# Patient Record
Sex: Male | Born: 1935 | Race: White | Hispanic: No | Marital: Married | Smoking: Former smoker
Health system: Southern US, Community
[De-identification: ages and names within clinical notes are randomized; demographics above are authoritative.]

## PROBLEM LIST (undated history)

## (undated) DIAGNOSIS — I4891 Unspecified atrial fibrillation: Secondary | ICD-10-CM

## (undated) DIAGNOSIS — I10 Essential (primary) hypertension: Secondary | ICD-10-CM

## (undated) DIAGNOSIS — C679 Malignant neoplasm of bladder, unspecified: Secondary | ICD-10-CM

## (undated) DIAGNOSIS — I639 Cerebral infarction, unspecified: Secondary | ICD-10-CM

## (undated) HISTORY — PX: PACEMAKER INSERTION: SHX728

## (undated) HISTORY — DX: Cerebral infarction, unspecified: I63.9

---

## 1982-04-16 DIAGNOSIS — C679 Malignant neoplasm of bladder, unspecified: Secondary | ICD-10-CM

## 1982-04-16 HISTORY — DX: Malignant neoplasm of bladder, unspecified: C67.9

## 2008-11-18 ENCOUNTER — Ambulatory Visit (HOSPITAL_COMMUNITY): Admission: RE | Admit: 2008-11-18 | Discharge: 2008-11-18 | Payer: Self-pay | Admitting: Neurological Surgery

## 2008-12-13 ENCOUNTER — Inpatient Hospital Stay (HOSPITAL_COMMUNITY): Admission: RE | Admit: 2008-12-13 | Discharge: 2008-12-14 | Payer: Self-pay | Admitting: Neurological Surgery

## 2009-10-12 ENCOUNTER — Emergency Department (HOSPITAL_BASED_OUTPATIENT_CLINIC_OR_DEPARTMENT_OTHER): Admission: EM | Admit: 2009-10-12 | Discharge: 2009-10-12 | Payer: Self-pay | Admitting: Emergency Medicine

## 2010-02-13 ENCOUNTER — Ambulatory Visit (HOSPITAL_COMMUNITY): Admission: RE | Admit: 2010-02-13 | Discharge: 2010-02-13 | Payer: Self-pay | Admitting: Neurological Surgery

## 2010-02-23 ENCOUNTER — Inpatient Hospital Stay (HOSPITAL_COMMUNITY): Admission: RE | Admit: 2010-02-23 | Discharge: 2010-02-27 | Payer: Self-pay | Admitting: Neurological Surgery

## 2010-06-27 LAB — BASIC METABOLIC PANEL
CO2: 25 mEq/L (ref 19–32)
Calcium: 8.3 mg/dL — ABNORMAL LOW (ref 8.4–10.5)
Calcium: 8.4 mg/dL (ref 8.4–10.5)
Calcium: 9.6 mg/dL (ref 8.4–10.5)
GFR calc Af Amer: 53 mL/min — ABNORMAL LOW (ref >60)
GFR calc Af Amer: >60 mL/min (ref >60)
GFR calc Af Amer: >60 mL/min (ref >60)
GFR calc non Af Amer: 44 mL/min — ABNORMAL LOW (ref >60)
GFR calc non Af Amer: 56 mL/min — ABNORMAL LOW (ref >60)
Glucose, Bld: 135 mg/dL — ABNORMAL HIGH (ref 70–99)
Sodium: 135 mEq/L (ref 135–145)
Sodium: 139 mEq/L (ref 135–145)
Sodium: 140 mEq/L (ref 135–145)

## 2010-06-27 LAB — SURGICAL PCR SCREEN
MRSA, PCR: NEGATIVE
Staphylococcus aureus: NEGATIVE

## 2010-06-27 LAB — CBC
Hemoglobin: 11.9 g/dL — ABNORMAL LOW (ref 13.0–17.0)
Hemoglobin: 14.9 g/dL (ref 13.0–17.0)
MCHC: 34.2 g/dL (ref 30.0–36.0)
Platelets: 182 10*3/uL (ref 150–400)
RBC: 3.87 MIL/uL — ABNORMAL LOW (ref 4.22–5.81)
RBC: 4.77 MIL/uL (ref 4.22–5.81)

## 2010-06-27 LAB — TYPE AND SCREEN: Antibody Screen: NEGATIVE

## 2010-06-28 LAB — POCT I-STAT, CHEM 8
BUN: 17 mg/dL (ref 6–23)
HCT: 46 % (ref 39.0–52.0)
Hemoglobin: 15.6 g/dL (ref 13.0–17.0)
Sodium: 140 mEq/L (ref 135–145)
TCO2: 27 mmol/L (ref 0–100)

## 2010-06-28 LAB — GLUCOSE, CAPILLARY
Glucose-Capillary: 120 mg/dL — ABNORMAL HIGH (ref 70–99)
Glucose-Capillary: 140 mg/dL — ABNORMAL HIGH (ref 70–99)

## 2010-07-22 LAB — GLUCOSE, CAPILLARY
Glucose-Capillary: 116 mg/dL — ABNORMAL HIGH (ref 70–99)
Glucose-Capillary: 90 mg/dL (ref 70–99)
Glucose-Capillary: 99 mg/dL (ref 70–99)

## 2010-07-22 LAB — CBC
HCT: 42.8 % (ref 39.0–52.0)
Platelets: 150 10*3/uL (ref 150–400)
WBC: 7.7 10*3/uL (ref 4.0–10.5)

## 2010-07-22 LAB — BASIC METABOLIC PANEL
BUN: 21 mg/dL (ref 6–23)
GFR calc non Af Amer: 58 mL/min — ABNORMAL LOW (ref >60)
Potassium: 4.4 mEq/L (ref 3.5–5.1)

## 2010-08-29 NOTE — Op Note (Signed)
NAMEFERGUSON, GERTNER               ACCOUNT NO.:  [id number]   MEDICAL RECORD NO.:  [id number]          PATIENT TYPE:  INP   LOCATION:  3533                         FACILITY:  [facility]   PHYSICIAN:  Stefani Dama, M.D.  DATE OF BIRTH:  June 15, 1930   DATE OF PROCEDURE:  12/13/2008  DATE OF DISCHARGE:                               OPERATIVE REPORT   PREOPERATIVE DIAGNOSIS:  Lumbar spondylosis, herniated nucleus pulposus  L1-L2 left with left lumbar radiculopathy.   POSTOPERATIVE DIAGNOSIS:  Lumbar spondylosis, herniated nucleus pulposus  L1-L2 left with left lumbar radiculopathy.   OPERATION:  Microdiskectomy L1-L2 left with operating microscope and  microdissection technique.   SURGEON:  Stefani Dama, MD   FIRST ASSISTANT:  Danae Orleans. Venetia Maxon, MD   ANESTHESIA:  General endotracheal.   INDICATIONS:  Mr. Xxavier Hays is a 77 year old individual who has had  significant problems with back and left lower extremity pain.  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  This was noted  on myelography.  After careful consideration of his options, I discussed  surgical decompression with microdiskectomy and this is now being  performed.   PROCEDURE:  The patient was brought to the operating room supine on the  stretcher.  After smooth induction of general endotracheal anesthesia,  he was turned prone and the back was prepped with alcohol and DuraPrep  and draped in a sterile fashion.  Care was taken to protect and pad all  the bony prominences.  Midline incision was then created and carried  down to the lumbodorsal fascia which was opened on the left side of  midline with a localizing radiograph identifying first the placement of  a needle at L1-L2.  Then, dissection demonstrated the interlaminar space  of what was believed to be L1-L2.  A second localizing radiograph  confirmed this.  A self-retaining McCullough retractor was used and a  laminotomy was then created  removing the inferior margin lamina of L1  out to the medial wall of the facet and a partial facetectomy was also  completed.  Once the L ligament was identified then this was taken up  with a 2 and 3 mm Kerrison punch.  Dura was noted to be dorsally and  medially and underneath this was noted to be a substantial mass with a  significant fibrosis over it.  By carefully dissecting the dura away,  the mass could be isolated and then when it was entered it was found to  be of large constellation of disk fragments which were removed in a  piecemeal fashion.  Carefully and arduously then the common dural tube  was decompressed from the left lateral border.  The disk space was then  further explored and the small osteophyte tool was then used to clear  further disk material more cephalad over the region of the disk space  and more medially.  The takeoff of the L1 nerve root superiorly was  decompressed in this fashion, removing disk fragment in addition to the  redundant and thickened ligament.  A portion of the superior articular  process for L2 was removed to allow good egress for the L1 nerve root  superiorly.  The inferior aspect of the L2 disk space was then explored  and the foramen for the L2 nerve root was decompressed.  Once this was  achieved, hemostasis in the soft tissues was achieved meticulously with  some Gelfoam pledgets which were irrigated away.  Once this was  ascertained to be as good as it could possibly be, the microscope was  removed.  The retractors were removed.  The lumbodorsal fascia was  closed  with #1 Vicryl in interrupted fashion, 2-0 Vicryl was used in the  subcutaneous tissues, and 3-0 Vicryl was used to close the subcuticular  skin.  The patient tolerated the procedure well.  Blood loss for this  procedure was less than 20 mL.      Stefani Dama, M.D.  Electronically Signed     HJE/MEDQ  D:  12/13/2008  T:  12/14/2008  Job:  [id number]

## 2011-09-18 DIAGNOSIS — H5340 Unspecified visual field defects: Secondary | ICD-10-CM | POA: Diagnosis not present

## 2011-09-18 DIAGNOSIS — H2510 Age-related nuclear cataract, unspecified eye: Secondary | ICD-10-CM | POA: Diagnosis not present

## 2011-09-18 DIAGNOSIS — H47329 Drusen of optic disc, unspecified eye: Secondary | ICD-10-CM | POA: Diagnosis not present

## 2012-09-29 DIAGNOSIS — H52209 Unspecified astigmatism, unspecified eye: Secondary | ICD-10-CM | POA: Diagnosis not present

## 2012-09-29 DIAGNOSIS — H2510 Age-related nuclear cataract, unspecified eye: Secondary | ICD-10-CM | POA: Diagnosis not present

## 2013-05-06 DIAGNOSIS — M25559 Pain in unspecified hip: Secondary | ICD-10-CM | POA: Diagnosis not present

## 2013-05-28 DIAGNOSIS — I10 Essential (primary) hypertension: Secondary | ICD-10-CM | POA: Diagnosis not present

## 2013-05-28 DIAGNOSIS — M48061 Spinal stenosis, lumbar region without neurogenic claudication: Secondary | ICD-10-CM | POA: Diagnosis not present

## 2013-05-28 DIAGNOSIS — Z6829 Body mass index (BMI) 29.0-29.9, adult: Secondary | ICD-10-CM | POA: Diagnosis not present

## 2013-06-01 DIAGNOSIS — I10 Essential (primary) hypertension: Secondary | ICD-10-CM | POA: Diagnosis not present

## 2013-06-04 DIAGNOSIS — M48061 Spinal stenosis, lumbar region without neurogenic claudication: Secondary | ICD-10-CM | POA: Diagnosis not present

## 2013-06-04 DIAGNOSIS — M47817 Spondylosis without myelopathy or radiculopathy, lumbosacral region: Secondary | ICD-10-CM | POA: Diagnosis not present

## 2013-06-04 DIAGNOSIS — M5126 Other intervertebral disc displacement, lumbar region: Secondary | ICD-10-CM | POA: Diagnosis not present

## 2013-06-09 DIAGNOSIS — E669 Obesity, unspecified: Secondary | ICD-10-CM | POA: Diagnosis not present

## 2013-06-09 DIAGNOSIS — M48061 Spinal stenosis, lumbar region without neurogenic claudication: Secondary | ICD-10-CM | POA: Diagnosis not present

## 2013-06-19 DIAGNOSIS — IMO0002 Reserved for concepts with insufficient information to code with codable children: Secondary | ICD-10-CM | POA: Diagnosis not present

## 2013-06-19 DIAGNOSIS — M47817 Spondylosis without myelopathy or radiculopathy, lumbosacral region: Secondary | ICD-10-CM | POA: Diagnosis not present

## 2013-08-17 DIAGNOSIS — R21 Rash and other nonspecific skin eruption: Secondary | ICD-10-CM | POA: Diagnosis not present

## 2013-10-27 DIAGNOSIS — R21 Rash and other nonspecific skin eruption: Secondary | ICD-10-CM | POA: Diagnosis not present

## 2014-01-27 ENCOUNTER — Other Ambulatory Visit (HOSPITAL_COMMUNITY): Payer: Self-pay | Admitting: Orthopedic Surgery

## 2014-01-27 DIAGNOSIS — M25562 Pain in left knee: Secondary | ICD-10-CM

## 2014-01-27 DIAGNOSIS — T84093A Other mechanical complication of internal left knee prosthesis, initial encounter: Secondary | ICD-10-CM | POA: Diagnosis not present

## 2014-01-27 DIAGNOSIS — Z96652 Presence of left artificial knee joint: Secondary | ICD-10-CM | POA: Diagnosis not present

## 2014-01-29 DIAGNOSIS — M47816 Spondylosis without myelopathy or radiculopathy, lumbar region: Secondary | ICD-10-CM | POA: Diagnosis not present

## 2014-01-29 DIAGNOSIS — M5417 Radiculopathy, lumbosacral region: Secondary | ICD-10-CM | POA: Diagnosis not present

## 2014-01-29 DIAGNOSIS — M5416 Radiculopathy, lumbar region: Secondary | ICD-10-CM | POA: Diagnosis not present

## 2014-01-29 DIAGNOSIS — Z981 Arthrodesis status: Secondary | ICD-10-CM | POA: Diagnosis not present

## 2014-01-29 DIAGNOSIS — M4727 Other spondylosis with radiculopathy, lumbosacral region: Secondary | ICD-10-CM | POA: Diagnosis not present

## 2014-02-10 ENCOUNTER — Encounter (HOSPITAL_COMMUNITY)
Admission: RE | Admit: 2014-02-10 | Discharge: 2014-02-10 | Disposition: A | Discharge status: Home / Self Care | Payer: Medicare Other | Source: Ambulatory Visit | Attending: Orthopedic Surgery | Admitting: Orthopedic Surgery

## 2014-02-10 DIAGNOSIS — M25562 Pain in left knee: Secondary | ICD-10-CM | POA: Insufficient documentation

## 2014-02-10 DIAGNOSIS — Z96659 Presence of unspecified artificial knee joint: Secondary | ICD-10-CM | POA: Diagnosis not present

## 2014-02-10 MED ORDER — TECHNETIUM TC 99M MEDRONATE IV KIT
26.8000 | PACK | Freq: Once | INTRAVENOUS | Status: AC | PRN
  Administered 2014-02-10: 26.8 via INTRAVENOUS

## 2014-09-03 DIAGNOSIS — Z961 Presence of intraocular lens: Secondary | ICD-10-CM | POA: Diagnosis not present

## 2014-09-03 DIAGNOSIS — H3531 Nonexudative age-related macular degeneration: Secondary | ICD-10-CM | POA: Diagnosis not present

## 2014-09-03 DIAGNOSIS — H43813 Vitreous degeneration, bilateral: Secondary | ICD-10-CM | POA: Diagnosis not present

## 2014-09-03 DIAGNOSIS — H3532 Exudative age-related macular degeneration: Secondary | ICD-10-CM | POA: Diagnosis not present

## 2014-09-30 DIAGNOSIS — H3532 Exudative age-related macular degeneration: Secondary | ICD-10-CM | POA: Diagnosis not present

## 2014-10-08 DIAGNOSIS — Z981 Arthrodesis status: Secondary | ICD-10-CM | POA: Diagnosis not present

## 2014-10-08 DIAGNOSIS — M5416 Radiculopathy, lumbar region: Secondary | ICD-10-CM | POA: Diagnosis not present

## 2014-10-08 DIAGNOSIS — M47816 Spondylosis without myelopathy or radiculopathy, lumbar region: Secondary | ICD-10-CM | POA: Diagnosis not present

## 2014-11-02 DIAGNOSIS — H3531 Nonexudative age-related macular degeneration: Secondary | ICD-10-CM | POA: Diagnosis not present

## 2014-11-02 DIAGNOSIS — H4011X4 Primary open-angle glaucoma, indeterminate stage: Secondary | ICD-10-CM | POA: Diagnosis not present

## 2014-11-02 DIAGNOSIS — Z7982 Long term (current) use of aspirin: Secondary | ICD-10-CM | POA: Diagnosis not present

## 2014-11-02 DIAGNOSIS — H47322 Drusen of optic disc, left eye: Secondary | ICD-10-CM | POA: Diagnosis not present

## 2014-11-02 DIAGNOSIS — H3532 Exudative age-related macular degeneration: Secondary | ICD-10-CM | POA: Diagnosis not present

## 2014-12-02 DIAGNOSIS — H43813 Vitreous degeneration, bilateral: Secondary | ICD-10-CM | POA: Diagnosis not present

## 2014-12-02 DIAGNOSIS — H3531 Nonexudative age-related macular degeneration: Secondary | ICD-10-CM | POA: Diagnosis not present

## 2014-12-02 DIAGNOSIS — H3532 Exudative age-related macular degeneration: Secondary | ICD-10-CM | POA: Diagnosis not present

## 2014-12-02 DIAGNOSIS — Z961 Presence of intraocular lens: Secondary | ICD-10-CM | POA: Diagnosis not present

## 2015-01-13 DIAGNOSIS — H43813 Vitreous degeneration, bilateral: Secondary | ICD-10-CM | POA: Diagnosis not present

## 2015-01-13 DIAGNOSIS — H3532 Exudative age-related macular degeneration: Secondary | ICD-10-CM | POA: Diagnosis not present

## 2015-01-13 DIAGNOSIS — H35363 Drusen (degenerative) of macula, bilateral: Secondary | ICD-10-CM | POA: Diagnosis not present

## 2015-01-13 DIAGNOSIS — H3531 Nonexudative age-related macular degeneration: Secondary | ICD-10-CM | POA: Diagnosis not present

## 2015-03-11 DIAGNOSIS — H401134 Primary open-angle glaucoma, bilateral, indeterminate stage: Secondary | ICD-10-CM | POA: Diagnosis not present

## 2015-03-11 DIAGNOSIS — Z79899 Other long term (current) drug therapy: Secondary | ICD-10-CM | POA: Diagnosis not present

## 2015-03-11 DIAGNOSIS — H353210 Exudative age-related macular degeneration, right eye, stage unspecified: Secondary | ICD-10-CM | POA: Diagnosis not present

## 2015-03-11 DIAGNOSIS — Z7982 Long term (current) use of aspirin: Secondary | ICD-10-CM | POA: Diagnosis not present

## 2015-04-15 DIAGNOSIS — I10 Essential (primary) hypertension: Secondary | ICD-10-CM | POA: Diagnosis not present

## 2015-04-15 DIAGNOSIS — Z87891 Personal history of nicotine dependence: Secondary | ICD-10-CM | POA: Diagnosis not present

## 2015-04-15 DIAGNOSIS — H353211 Exudative age-related macular degeneration, right eye, with active choroidal neovascularization: Secondary | ICD-10-CM | POA: Diagnosis not present

## 2015-04-15 DIAGNOSIS — H353122 Nonexudative age-related macular degeneration, left eye, intermediate dry stage: Secondary | ICD-10-CM | POA: Diagnosis not present

## 2015-04-15 DIAGNOSIS — Z7982 Long term (current) use of aspirin: Secondary | ICD-10-CM | POA: Diagnosis not present

## 2015-07-29 DIAGNOSIS — L309 Dermatitis, unspecified: Secondary | ICD-10-CM | POA: Diagnosis not present

## 2015-09-16 DIAGNOSIS — H353190 Nonexudative age-related macular degeneration, unspecified eye, stage unspecified: Secondary | ICD-10-CM | POA: Diagnosis not present

## 2015-09-16 DIAGNOSIS — H353122 Nonexudative age-related macular degeneration, left eye, intermediate dry stage: Secondary | ICD-10-CM | POA: Diagnosis not present

## 2015-09-16 DIAGNOSIS — H401134 Primary open-angle glaucoma, bilateral, indeterminate stage: Secondary | ICD-10-CM | POA: Diagnosis not present

## 2015-09-16 DIAGNOSIS — H353211 Exudative age-related macular degeneration, right eye, with active choroidal neovascularization: Secondary | ICD-10-CM | POA: Diagnosis not present

## 2015-10-27 DIAGNOSIS — I10 Essential (primary) hypertension: Secondary | ICD-10-CM | POA: Diagnosis not present

## 2015-10-27 DIAGNOSIS — Z6832 Body mass index (BMI) 32.0-32.9, adult: Secondary | ICD-10-CM | POA: Diagnosis not present

## 2015-10-27 DIAGNOSIS — M4806 Spinal stenosis, lumbar region: Secondary | ICD-10-CM | POA: Diagnosis not present

## 2015-10-28 DIAGNOSIS — H353122 Nonexudative age-related macular degeneration, left eye, intermediate dry stage: Secondary | ICD-10-CM | POA: Diagnosis not present

## 2015-10-28 DIAGNOSIS — H353211 Exudative age-related macular degeneration, right eye, with active choroidal neovascularization: Secondary | ICD-10-CM | POA: Diagnosis not present

## 2015-10-28 DIAGNOSIS — H401134 Primary open-angle glaucoma, bilateral, indeterminate stage: Secondary | ICD-10-CM | POA: Diagnosis not present

## 2015-10-31 DIAGNOSIS — I10 Essential (primary) hypertension: Secondary | ICD-10-CM | POA: Diagnosis not present

## 2015-11-02 DIAGNOSIS — M4806 Spinal stenosis, lumbar region: Secondary | ICD-10-CM | POA: Diagnosis not present

## 2015-11-16 DIAGNOSIS — M4806 Spinal stenosis, lumbar region: Secondary | ICD-10-CM | POA: Diagnosis not present

## 2015-11-25 DIAGNOSIS — Z981 Arthrodesis status: Secondary | ICD-10-CM | POA: Diagnosis not present

## 2015-11-25 DIAGNOSIS — M4806 Spinal stenosis, lumbar region: Secondary | ICD-10-CM | POA: Diagnosis not present

## 2015-11-25 DIAGNOSIS — M5136 Other intervertebral disc degeneration, lumbar region: Secondary | ICD-10-CM | POA: Diagnosis not present

## 2015-11-25 DIAGNOSIS — M5416 Radiculopathy, lumbar region: Secondary | ICD-10-CM | POA: Diagnosis not present

## 2015-11-25 DIAGNOSIS — M4726 Other spondylosis with radiculopathy, lumbar region: Secondary | ICD-10-CM | POA: Diagnosis not present

## 2015-12-02 DIAGNOSIS — H353211 Exudative age-related macular degeneration, right eye, with active choroidal neovascularization: Secondary | ICD-10-CM | POA: Diagnosis not present

## 2015-12-02 DIAGNOSIS — H353122 Nonexudative age-related macular degeneration, left eye, intermediate dry stage: Secondary | ICD-10-CM | POA: Diagnosis not present

## 2015-12-02 DIAGNOSIS — Z87891 Personal history of nicotine dependence: Secondary | ICD-10-CM | POA: Diagnosis not present

## 2016-10-29 DIAGNOSIS — M5416 Radiculopathy, lumbar region: Secondary | ICD-10-CM | POA: Diagnosis not present

## 2016-10-29 DIAGNOSIS — M5136 Other intervertebral disc degeneration, lumbar region: Secondary | ICD-10-CM | POA: Diagnosis not present

## 2016-10-29 DIAGNOSIS — Z981 Arthrodesis status: Secondary | ICD-10-CM | POA: Diagnosis not present

## 2016-10-29 DIAGNOSIS — M48061 Spinal stenosis, lumbar region without neurogenic claudication: Secondary | ICD-10-CM | POA: Diagnosis not present

## 2016-10-29 DIAGNOSIS — M4726 Other spondylosis with radiculopathy, lumbar region: Secondary | ICD-10-CM | POA: Diagnosis not present

## 2016-12-06 DIAGNOSIS — M4722 Other spondylosis with radiculopathy, cervical region: Secondary | ICD-10-CM | POA: Diagnosis not present

## 2017-02-18 DIAGNOSIS — M4727 Other spondylosis with radiculopathy, lumbosacral region: Secondary | ICD-10-CM | POA: Diagnosis not present

## 2017-02-18 DIAGNOSIS — S336XXA Sprain of sacroiliac joint, initial encounter: Secondary | ICD-10-CM | POA: Diagnosis not present

## 2017-02-18 DIAGNOSIS — M9904 Segmental and somatic dysfunction of sacral region: Secondary | ICD-10-CM | POA: Diagnosis not present

## 2017-02-18 DIAGNOSIS — M9903 Segmental and somatic dysfunction of lumbar region: Secondary | ICD-10-CM | POA: Diagnosis not present

## 2017-02-19 DIAGNOSIS — M4727 Other spondylosis with radiculopathy, lumbosacral region: Secondary | ICD-10-CM | POA: Diagnosis not present

## 2017-02-19 DIAGNOSIS — M9903 Segmental and somatic dysfunction of lumbar region: Secondary | ICD-10-CM | POA: Diagnosis not present

## 2017-02-19 DIAGNOSIS — M9904 Segmental and somatic dysfunction of sacral region: Secondary | ICD-10-CM | POA: Diagnosis not present

## 2017-02-19 DIAGNOSIS — S336XXA Sprain of sacroiliac joint, initial encounter: Secondary | ICD-10-CM | POA: Diagnosis not present

## 2017-02-20 DIAGNOSIS — M9904 Segmental and somatic dysfunction of sacral region: Secondary | ICD-10-CM | POA: Diagnosis not present

## 2017-02-20 DIAGNOSIS — M9903 Segmental and somatic dysfunction of lumbar region: Secondary | ICD-10-CM | POA: Diagnosis not present

## 2017-02-20 DIAGNOSIS — M4727 Other spondylosis with radiculopathy, lumbosacral region: Secondary | ICD-10-CM | POA: Diagnosis not present

## 2017-02-20 DIAGNOSIS — S336XXA Sprain of sacroiliac joint, initial encounter: Secondary | ICD-10-CM | POA: Diagnosis not present

## 2017-02-25 DIAGNOSIS — S336XXA Sprain of sacroiliac joint, initial encounter: Secondary | ICD-10-CM | POA: Diagnosis not present

## 2017-02-25 DIAGNOSIS — M4727 Other spondylosis with radiculopathy, lumbosacral region: Secondary | ICD-10-CM | POA: Diagnosis not present

## 2017-02-25 DIAGNOSIS — M9904 Segmental and somatic dysfunction of sacral region: Secondary | ICD-10-CM | POA: Diagnosis not present

## 2017-02-25 DIAGNOSIS — M9903 Segmental and somatic dysfunction of lumbar region: Secondary | ICD-10-CM | POA: Diagnosis not present

## 2017-02-26 DIAGNOSIS — M4727 Other spondylosis with radiculopathy, lumbosacral region: Secondary | ICD-10-CM | POA: Diagnosis not present

## 2017-02-26 DIAGNOSIS — M9904 Segmental and somatic dysfunction of sacral region: Secondary | ICD-10-CM | POA: Diagnosis not present

## 2017-02-26 DIAGNOSIS — M9903 Segmental and somatic dysfunction of lumbar region: Secondary | ICD-10-CM | POA: Diagnosis not present

## 2017-02-26 DIAGNOSIS — S336XXA Sprain of sacroiliac joint, initial encounter: Secondary | ICD-10-CM | POA: Diagnosis not present

## 2017-02-27 DIAGNOSIS — S336XXA Sprain of sacroiliac joint, initial encounter: Secondary | ICD-10-CM | POA: Diagnosis not present

## 2017-02-27 DIAGNOSIS — M4727 Other spondylosis with radiculopathy, lumbosacral region: Secondary | ICD-10-CM | POA: Diagnosis not present

## 2017-02-27 DIAGNOSIS — M9904 Segmental and somatic dysfunction of sacral region: Secondary | ICD-10-CM | POA: Diagnosis not present

## 2017-02-27 DIAGNOSIS — M9903 Segmental and somatic dysfunction of lumbar region: Secondary | ICD-10-CM | POA: Diagnosis not present

## 2017-02-28 DIAGNOSIS — M9903 Segmental and somatic dysfunction of lumbar region: Secondary | ICD-10-CM | POA: Diagnosis not present

## 2017-02-28 DIAGNOSIS — M9904 Segmental and somatic dysfunction of sacral region: Secondary | ICD-10-CM | POA: Diagnosis not present

## 2017-02-28 DIAGNOSIS — M4727 Other spondylosis with radiculopathy, lumbosacral region: Secondary | ICD-10-CM | POA: Diagnosis not present

## 2017-02-28 DIAGNOSIS — S336XXA Sprain of sacroiliac joint, initial encounter: Secondary | ICD-10-CM | POA: Diagnosis not present

## 2017-03-04 DIAGNOSIS — M9903 Segmental and somatic dysfunction of lumbar region: Secondary | ICD-10-CM | POA: Diagnosis not present

## 2017-03-04 DIAGNOSIS — M4727 Other spondylosis with radiculopathy, lumbosacral region: Secondary | ICD-10-CM | POA: Diagnosis not present

## 2017-03-04 DIAGNOSIS — S336XXA Sprain of sacroiliac joint, initial encounter: Secondary | ICD-10-CM | POA: Diagnosis not present

## 2017-03-04 DIAGNOSIS — M9904 Segmental and somatic dysfunction of sacral region: Secondary | ICD-10-CM | POA: Diagnosis not present

## 2017-03-05 DIAGNOSIS — M9904 Segmental and somatic dysfunction of sacral region: Secondary | ICD-10-CM | POA: Diagnosis not present

## 2017-03-05 DIAGNOSIS — M9903 Segmental and somatic dysfunction of lumbar region: Secondary | ICD-10-CM | POA: Diagnosis not present

## 2017-03-05 DIAGNOSIS — M4727 Other spondylosis with radiculopathy, lumbosacral region: Secondary | ICD-10-CM | POA: Diagnosis not present

## 2017-03-05 DIAGNOSIS — S336XXA Sprain of sacroiliac joint, initial encounter: Secondary | ICD-10-CM | POA: Diagnosis not present

## 2017-03-11 DIAGNOSIS — M4727 Other spondylosis with radiculopathy, lumbosacral region: Secondary | ICD-10-CM | POA: Diagnosis not present

## 2017-03-11 DIAGNOSIS — M9903 Segmental and somatic dysfunction of lumbar region: Secondary | ICD-10-CM | POA: Diagnosis not present

## 2017-03-11 DIAGNOSIS — M9904 Segmental and somatic dysfunction of sacral region: Secondary | ICD-10-CM | POA: Diagnosis not present

## 2017-03-11 DIAGNOSIS — S336XXA Sprain of sacroiliac joint, initial encounter: Secondary | ICD-10-CM | POA: Diagnosis not present

## 2017-03-12 DIAGNOSIS — S336XXA Sprain of sacroiliac joint, initial encounter: Secondary | ICD-10-CM | POA: Diagnosis not present

## 2017-03-12 DIAGNOSIS — M4727 Other spondylosis with radiculopathy, lumbosacral region: Secondary | ICD-10-CM | POA: Diagnosis not present

## 2017-03-12 DIAGNOSIS — M9903 Segmental and somatic dysfunction of lumbar region: Secondary | ICD-10-CM | POA: Diagnosis not present

## 2017-03-12 DIAGNOSIS — M9904 Segmental and somatic dysfunction of sacral region: Secondary | ICD-10-CM | POA: Diagnosis not present

## 2017-03-13 DIAGNOSIS — S336XXA Sprain of sacroiliac joint, initial encounter: Secondary | ICD-10-CM | POA: Diagnosis not present

## 2017-03-13 DIAGNOSIS — M9903 Segmental and somatic dysfunction of lumbar region: Secondary | ICD-10-CM | POA: Diagnosis not present

## 2017-03-13 DIAGNOSIS — M9904 Segmental and somatic dysfunction of sacral region: Secondary | ICD-10-CM | POA: Diagnosis not present

## 2017-03-13 DIAGNOSIS — M4727 Other spondylosis with radiculopathy, lumbosacral region: Secondary | ICD-10-CM | POA: Diagnosis not present

## 2017-03-18 DIAGNOSIS — M9903 Segmental and somatic dysfunction of lumbar region: Secondary | ICD-10-CM | POA: Diagnosis not present

## 2017-03-18 DIAGNOSIS — M9904 Segmental and somatic dysfunction of sacral region: Secondary | ICD-10-CM | POA: Diagnosis not present

## 2017-03-18 DIAGNOSIS — S336XXA Sprain of sacroiliac joint, initial encounter: Secondary | ICD-10-CM | POA: Diagnosis not present

## 2017-03-18 DIAGNOSIS — M4727 Other spondylosis with radiculopathy, lumbosacral region: Secondary | ICD-10-CM | POA: Diagnosis not present

## 2017-03-19 DIAGNOSIS — M9904 Segmental and somatic dysfunction of sacral region: Secondary | ICD-10-CM | POA: Diagnosis not present

## 2017-03-19 DIAGNOSIS — M9903 Segmental and somatic dysfunction of lumbar region: Secondary | ICD-10-CM | POA: Diagnosis not present

## 2017-03-19 DIAGNOSIS — S336XXA Sprain of sacroiliac joint, initial encounter: Secondary | ICD-10-CM | POA: Diagnosis not present

## 2017-03-19 DIAGNOSIS — M4727 Other spondylosis with radiculopathy, lumbosacral region: Secondary | ICD-10-CM | POA: Diagnosis not present

## 2017-03-20 DIAGNOSIS — S336XXA Sprain of sacroiliac joint, initial encounter: Secondary | ICD-10-CM | POA: Diagnosis not present

## 2017-03-20 DIAGNOSIS — M9904 Segmental and somatic dysfunction of sacral region: Secondary | ICD-10-CM | POA: Diagnosis not present

## 2017-03-20 DIAGNOSIS — M4727 Other spondylosis with radiculopathy, lumbosacral region: Secondary | ICD-10-CM | POA: Diagnosis not present

## 2017-03-20 DIAGNOSIS — M9903 Segmental and somatic dysfunction of lumbar region: Secondary | ICD-10-CM | POA: Diagnosis not present

## 2017-05-27 ENCOUNTER — Emergency Department (HOSPITAL_COMMUNITY): Payer: Medicare Other | Admitting: Anesthesiology

## 2017-05-27 ENCOUNTER — Encounter (HOSPITAL_COMMUNITY)
Admission: EM | Disposition: A | Discharge status: Rehabilitation Facility | Payer: Self-pay | Source: Home / Self Care | Attending: Neurology

## 2017-05-27 ENCOUNTER — Emergency Department (HOSPITAL_COMMUNITY): Payer: Medicare Other

## 2017-05-27 ENCOUNTER — Inpatient Hospital Stay (HOSPITAL_COMMUNITY)
Admission: EM | Admit: 2017-05-27 | Discharge: 2017-05-30 | DRG: 023 | Disposition: A | Discharge status: Rehabilitation Facility | Payer: Medicare Other | Attending: Neurology | Admitting: Neurology

## 2017-05-27 ENCOUNTER — Encounter (HOSPITAL_COMMUNITY): Payer: Self-pay

## 2017-05-27 ENCOUNTER — Other Ambulatory Visit: Payer: Self-pay

## 2017-05-27 DIAGNOSIS — I69354 Hemiplegia and hemiparesis following cerebral infarction affecting left non-dominant side: Secondary | ICD-10-CM | POA: Diagnosis not present

## 2017-05-27 DIAGNOSIS — I6789 Other cerebrovascular disease: Secondary | ICD-10-CM | POA: Diagnosis not present

## 2017-05-27 DIAGNOSIS — I69328 Other speech and language deficits following cerebral infarction: Secondary | ICD-10-CM | POA: Diagnosis not present

## 2017-05-27 DIAGNOSIS — I6340 Cerebral infarction due to embolism of unspecified cerebral artery: Secondary | ICD-10-CM | POA: Diagnosis not present

## 2017-05-27 DIAGNOSIS — J90 Pleural effusion, not elsewhere classified: Secondary | ICD-10-CM | POA: Diagnosis not present

## 2017-05-27 DIAGNOSIS — I69318 Other symptoms and signs involving cognitive functions following cerebral infarction: Secondary | ICD-10-CM | POA: Diagnosis not present

## 2017-05-27 DIAGNOSIS — L7632 Postprocedural hematoma of skin and subcutaneous tissue following other procedure: Secondary | ICD-10-CM | POA: Diagnosis not present

## 2017-05-27 DIAGNOSIS — I639 Cerebral infarction, unspecified: Secondary | ICD-10-CM | POA: Diagnosis not present

## 2017-05-27 DIAGNOSIS — Y92230 Patient room in hospital as the place of occurrence of the external cause: Secondary | ICD-10-CM | POA: Diagnosis not present

## 2017-05-27 DIAGNOSIS — I4891 Unspecified atrial fibrillation: Secondary | ICD-10-CM | POA: Diagnosis present

## 2017-05-27 DIAGNOSIS — I110 Hypertensive heart disease with heart failure: Secondary | ICD-10-CM | POA: Diagnosis not present

## 2017-05-27 DIAGNOSIS — R918 Other nonspecific abnormal finding of lung field: Secondary | ICD-10-CM | POA: Diagnosis not present

## 2017-05-27 DIAGNOSIS — Z79890 Hormone replacement therapy (postmenopausal): Secondary | ICD-10-CM | POA: Diagnosis not present

## 2017-05-27 DIAGNOSIS — K5900 Constipation, unspecified: Secondary | ICD-10-CM | POA: Diagnosis present

## 2017-05-27 DIAGNOSIS — R339 Retention of urine, unspecified: Secondary | ICD-10-CM | POA: Diagnosis not present

## 2017-05-27 DIAGNOSIS — I63511 Cerebral infarction due to unspecified occlusion or stenosis of right middle cerebral artery: Secondary | ICD-10-CM | POA: Diagnosis not present

## 2017-05-27 DIAGNOSIS — R29810 Facial weakness: Secondary | ICD-10-CM | POA: Diagnosis present

## 2017-05-27 DIAGNOSIS — R29818 Other symptoms and signs involving the nervous system: Secondary | ICD-10-CM | POA: Diagnosis not present

## 2017-05-27 DIAGNOSIS — I480 Paroxysmal atrial fibrillation: Secondary | ICD-10-CM | POA: Diagnosis not present

## 2017-05-27 DIAGNOSIS — I361 Nonrheumatic tricuspid (valve) insufficiency: Secondary | ICD-10-CM | POA: Diagnosis not present

## 2017-05-27 DIAGNOSIS — I5032 Chronic diastolic (congestive) heart failure: Secondary | ICD-10-CM | POA: Diagnosis present

## 2017-05-27 DIAGNOSIS — R29714 NIHSS score 14: Secondary | ICD-10-CM | POA: Diagnosis present

## 2017-05-27 DIAGNOSIS — I63411 Cerebral infarction due to embolism of right middle cerebral artery: Principal | ICD-10-CM | POA: Diagnosis present

## 2017-05-27 DIAGNOSIS — G8114 Spastic hemiplegia affecting left nondominant side: Secondary | ICD-10-CM | POA: Diagnosis not present

## 2017-05-27 DIAGNOSIS — Z79899 Other long term (current) drug therapy: Secondary | ICD-10-CM

## 2017-05-27 DIAGNOSIS — N318 Other neuromuscular dysfunction of bladder: Secondary | ICD-10-CM | POA: Diagnosis present

## 2017-05-27 DIAGNOSIS — I6601 Occlusion and stenosis of right middle cerebral artery: Secondary | ICD-10-CM | POA: Diagnosis present

## 2017-05-27 DIAGNOSIS — I10 Essential (primary) hypertension: Secondary | ICD-10-CM | POA: Diagnosis not present

## 2017-05-27 DIAGNOSIS — G8194 Hemiplegia, unspecified affecting left nondominant side: Secondary | ICD-10-CM | POA: Diagnosis not present

## 2017-05-27 DIAGNOSIS — M791 Myalgia, unspecified site: Secondary | ICD-10-CM | POA: Diagnosis present

## 2017-05-27 DIAGNOSIS — I63031 Cerebral infarction due to thrombosis of right carotid artery: Secondary | ICD-10-CM

## 2017-05-27 DIAGNOSIS — D62 Acute posthemorrhagic anemia: Secondary | ICD-10-CM | POA: Diagnosis not present

## 2017-05-27 DIAGNOSIS — R471 Dysarthria and anarthria: Secondary | ICD-10-CM | POA: Diagnosis present

## 2017-05-27 DIAGNOSIS — R414 Neurologic neglect syndrome: Secondary | ICD-10-CM | POA: Diagnosis not present

## 2017-05-27 DIAGNOSIS — N390 Urinary tract infection, site not specified: Secondary | ICD-10-CM | POA: Diagnosis not present

## 2017-05-27 DIAGNOSIS — Y838 Other surgical procedures as the cause of abnormal reaction of the patient, or of later complication, without mention of misadventure at the time of the procedure: Secondary | ICD-10-CM | POA: Diagnosis not present

## 2017-05-27 DIAGNOSIS — G936 Cerebral edema: Secondary | ICD-10-CM | POA: Diagnosis present

## 2017-05-27 DIAGNOSIS — R1310 Dysphagia, unspecified: Secondary | ICD-10-CM | POA: Diagnosis not present

## 2017-05-27 DIAGNOSIS — D320 Benign neoplasm of cerebral meninges: Secondary | ICD-10-CM | POA: Diagnosis present

## 2017-05-27 DIAGNOSIS — Z950 Presence of cardiac pacemaker: Secondary | ICD-10-CM

## 2017-05-27 DIAGNOSIS — E785 Hyperlipidemia, unspecified: Secondary | ICD-10-CM | POA: Diagnosis not present

## 2017-05-27 DIAGNOSIS — K5901 Slow transit constipation: Secondary | ICD-10-CM | POA: Diagnosis not present

## 2017-05-27 DIAGNOSIS — Z8679 Personal history of other diseases of the circulatory system: Secondary | ICD-10-CM | POA: Diagnosis not present

## 2017-05-27 DIAGNOSIS — I63432 Cerebral infarction due to embolism of left posterior cerebral artery: Secondary | ICD-10-CM | POA: Diagnosis not present

## 2017-05-27 DIAGNOSIS — I482 Chronic atrial fibrillation: Secondary | ICD-10-CM | POA: Diagnosis not present

## 2017-05-27 DIAGNOSIS — I69391 Dysphagia following cerebral infarction: Secondary | ICD-10-CM | POA: Diagnosis not present

## 2017-05-27 DIAGNOSIS — R1312 Dysphagia, oropharyngeal phase: Secondary | ICD-10-CM | POA: Diagnosis not present

## 2017-05-27 DIAGNOSIS — R209 Unspecified disturbances of skin sensation: Secondary | ICD-10-CM | POA: Diagnosis not present

## 2017-05-27 DIAGNOSIS — I69398 Other sequelae of cerebral infarction: Secondary | ICD-10-CM | POA: Diagnosis not present

## 2017-05-27 DIAGNOSIS — B9689 Other specified bacterial agents as the cause of diseases classified elsewhere: Secondary | ICD-10-CM | POA: Diagnosis not present

## 2017-05-27 DIAGNOSIS — I672 Cerebral atherosclerosis: Secondary | ICD-10-CM | POA: Diagnosis present

## 2017-05-27 DIAGNOSIS — I509 Heart failure, unspecified: Secondary | ICD-10-CM

## 2017-05-27 DIAGNOSIS — N182 Chronic kidney disease, stage 2 (mild): Secondary | ICD-10-CM | POA: Diagnosis present

## 2017-05-27 DIAGNOSIS — I129 Hypertensive chronic kidney disease with stage 1 through stage 4 chronic kidney disease, or unspecified chronic kidney disease: Secondary | ICD-10-CM | POA: Diagnosis present

## 2017-05-27 DIAGNOSIS — T148XXA Other injury of unspecified body region, initial encounter: Secondary | ICD-10-CM | POA: Diagnosis not present

## 2017-05-27 DIAGNOSIS — N183 Chronic kidney disease, stage 3 (moderate): Secondary | ICD-10-CM | POA: Diagnosis not present

## 2017-05-27 DIAGNOSIS — I69319 Unspecified symptoms and signs involving cognitive functions following cerebral infarction: Secondary | ICD-10-CM | POA: Diagnosis not present

## 2017-05-27 DIAGNOSIS — E039 Hypothyroidism, unspecified: Secondary | ICD-10-CM | POA: Diagnosis not present

## 2017-05-27 HISTORY — PX: IR CT HEAD LTD: IMG2386

## 2017-05-27 HISTORY — DX: Essential (primary) hypertension: I10

## 2017-05-27 HISTORY — PX: RADIOLOGY WITH ANESTHESIA: SHX6223

## 2017-05-27 HISTORY — DX: Unspecified atrial fibrillation: I48.91

## 2017-05-27 HISTORY — PX: IR PERCUTANEOUS ART THROMBECTOMY/INFUSION INTRACRANIAL INC DIAG ANGIO: IMG6087

## 2017-05-27 LAB — DIFFERENTIAL
BASOS PCT: 1 %
Basophils Absolute: 0.1 10*3/uL (ref 0.0–0.1)
EOS ABS: 0.2 10*3/uL (ref 0.0–0.7)
Eosinophils Relative: 2 %
LYMPHS ABS: 1.7 10*3/uL (ref 0.7–4.0)
Lymphocytes Relative: 23 %
MONO ABS: 0.7 10*3/uL (ref 0.1–1.0)
Monocytes Relative: 10 %
Neutro Abs: 4.8 10*3/uL (ref 1.7–7.7)
Neutrophils Relative %: 64 %

## 2017-05-27 LAB — CBC
HEMATOCRIT: 42.4 % (ref 39.0–52.0)
HEMOGLOBIN: 14.7 g/dL (ref 13.0–17.0)
MCH: 31 pg (ref 26.0–34.0)
MCHC: 34.7 g/dL (ref 30.0–36.0)
MCV: 89.5 fL (ref 78.0–100.0)
Platelets: 218 10*3/uL (ref 150–400)
RBC: 4.74 MIL/uL (ref 4.22–5.81)
RDW: 13.5 % (ref 11.5–15.5)
WBC: 7.5 10*3/uL (ref 4.0–10.5)

## 2017-05-27 LAB — COMPREHENSIVE METABOLIC PANEL
ALT: 10 U/L — ABNORMAL LOW (ref 17–63)
AST: 22 U/L (ref 15–41)
Albumin: 3.9 g/dL (ref 3.5–5.0)
Alkaline Phosphatase: 76 U/L (ref 38–126)
Anion gap: 10 (ref 5–15)
BILIRUBIN TOTAL: 1.1 mg/dL (ref 0.3–1.2)
BUN: 26 mg/dL — AB (ref 6–20)
CHLORIDE: 105 mmol/L (ref 101–111)
CO2: 24 mmol/L (ref 22–32)
CREATININE: 1.46 mg/dL — AB (ref 0.61–1.24)
Calcium: 9.3 mg/dL (ref 8.9–10.3)
GFR calc non Af Amer: 42 mL/min — ABNORMAL LOW (ref >60)
GFR, EST AFRICAN AMERICAN: 49 mL/min — AB (ref >60)
Glucose, Bld: 128 mg/dL — ABNORMAL HIGH (ref 65–99)
POTASSIUM: 4.2 mmol/L (ref 3.5–5.1)
Sodium: 139 mmol/L (ref 135–145)
TOTAL PROTEIN: 6.9 g/dL (ref 6.5–8.1)

## 2017-05-27 LAB — I-STAT CHEM 8, ED
BUN: 27 mg/dL — AB (ref 6–20)
CALCIUM ION: 1.04 mmol/L — AB (ref 1.15–1.40)
CREATININE: 1.4 mg/dL — AB (ref 0.61–1.24)
Chloride: 105 mmol/L (ref 101–111)
Glucose, Bld: 128 mg/dL — ABNORMAL HIGH (ref 65–99)
HEMATOCRIT: 43 % (ref 39.0–52.0)
HEMOGLOBIN: 14.6 g/dL (ref 13.0–17.0)
Potassium: 4.1 mmol/L (ref 3.5–5.1)
SODIUM: 140 mmol/L (ref 135–145)
TCO2: 23 mmol/L (ref 22–32)

## 2017-05-27 LAB — APTT: aPTT: 30 seconds (ref 24–36)

## 2017-05-27 LAB — CBG MONITORING, ED: Glucose-Capillary: 128 mg/dL — ABNORMAL HIGH (ref 65–99)

## 2017-05-27 LAB — I-STAT TROPONIN, ED: TROPONIN I, POC: 0 ng/mL (ref 0.00–0.08)

## 2017-05-27 LAB — PROTIME-INR
INR: 1.01
Prothrombin Time: 13.2 seconds (ref 11.4–15.2)

## 2017-05-27 SURGERY — RADIOLOGY WITH ANESTHESIA
Anesthesia: General

## 2017-05-27 MED ORDER — LEVOTHYROXINE SODIUM 112 MCG PO TABS
112.0000 ug | ORAL_TABLET | Freq: Every day | ORAL | Status: DC
  Administered 2017-05-29 – 2017-05-30 (×2): 112 ug via ORAL
  Filled 2017-05-27 (×3): qty 1

## 2017-05-27 MED ORDER — TICAGRELOR 90 MG PO TABS
ORAL_TABLET | ORAL | Status: AC
  Filled 2017-05-27: qty 2

## 2017-05-27 MED ORDER — IOPAMIDOL (ISOVUE-300) INJECTION 61%
INTRAVENOUS | Status: AC
  Administered 2017-05-27: 150 mL
  Filled 2017-05-27: qty 150

## 2017-05-27 MED ORDER — ASPIRIN 81 MG PO CHEW
CHEWABLE_TABLET | ORAL | Status: AC
  Filled 2017-05-27: qty 1

## 2017-05-27 MED ORDER — ONDANSETRON HCL 4 MG/2ML IJ SOLN
INTRAMUSCULAR | Status: DC | PRN
  Administered 2017-05-27: 4 mg via INTRAVENOUS

## 2017-05-27 MED ORDER — LIDOCAINE HCL (CARDIAC) 20 MG/ML IV SOLN
INTRAVENOUS | Status: DC | PRN
  Administered 2017-05-27: 60 mg via INTRATRACHEAL

## 2017-05-27 MED ORDER — NITROGLYCERIN 1 MG/10 ML FOR IR/CATH LAB
INTRA_ARTERIAL | Status: AC
  Filled 2017-05-27: qty 10

## 2017-05-27 MED ORDER — CEFAZOLIN SODIUM-DEXTROSE 2-3 GM-%(50ML) IV SOLR
INTRAVENOUS | Status: DC | PRN
  Administered 2017-05-27: 2 g via INTRAVENOUS

## 2017-05-27 MED ORDER — DEXAMETHASONE SODIUM PHOSPHATE 10 MG/ML IJ SOLN
INTRAMUSCULAR | Status: DC | PRN
  Administered 2017-05-27: 10 mg via INTRAVENOUS

## 2017-05-27 MED ORDER — ACETAMINOPHEN 650 MG RE SUPP: 650.0000 mg | RECTAL | Status: DC | PRN

## 2017-05-27 MED ORDER — SENNOSIDES-DOCUSATE SODIUM 8.6-50 MG PO TABS: 1.0000 | ORAL_TABLET | Freq: Every evening | ORAL | Status: DC | PRN

## 2017-05-27 MED ORDER — POLYVINYL ALCOHOL 1.4 % OP SOLN: 1.0000 [drp] | Freq: Four times a day (QID) | OPHTHALMIC | Status: DC | PRN

## 2017-05-27 MED ORDER — SODIUM CHLORIDE 0.9 % IJ SOLN
INTRAVENOUS | Status: DC | PRN
  Administered 2017-05-27 (×2): 25 ug via INTRA_ARTERIAL

## 2017-05-27 MED ORDER — FUROSEMIDE 20 MG PO TABS: 20.0000 mg | ORAL_TABLET | Freq: Every day | ORAL | Status: DC | PRN

## 2017-05-27 MED ORDER — ACETAMINOPHEN 325 MG PO TABS: 650.0000 mg | ORAL_TABLET | ORAL | Status: DC | PRN

## 2017-05-27 MED ORDER — PROPOFOL 10 MG/ML IV BOLUS
INTRAVENOUS | Status: DC | PRN
  Administered 2017-05-27: 140 mg via INTRAVENOUS

## 2017-05-27 MED ORDER — HEPARIN SODIUM (PORCINE) 1000 UNIT/ML IJ SOLN
INTRAMUSCULAR | Status: AC
  Filled 2017-05-27: qty 1

## 2017-05-27 MED ORDER — CEFAZOLIN SODIUM-DEXTROSE 2-4 GM/100ML-% IV SOLN
INTRAVENOUS | Status: AC
  Filled 2017-05-27: qty 100

## 2017-05-27 MED ORDER — PREGABALIN 50 MG PO CAPS
100.0000 mg | ORAL_CAPSULE | Freq: Two times a day (BID) | ORAL | Status: DC
Start: 2017-05-27 — End: 2017-05-30
  Administered 2017-05-28 – 2017-05-30 (×5): 100 mg via ORAL
  Filled 2017-05-27 (×5): qty 2

## 2017-05-27 MED ORDER — EPTIFIBATIDE 20 MG/10ML IV SOLN
INTRAVENOUS | Status: AC
  Filled 2017-05-27: qty 10

## 2017-05-27 MED ORDER — ASPIRIN 81 MG PO CHEW
CHEWABLE_TABLET | ORAL | Status: DC | PRN
  Administered 2017-05-27: 81 mg

## 2017-05-27 MED ORDER — ACETAMINOPHEN 160 MG/5ML PO SOLN: 650.0000 mg | ORAL | Status: DC | PRN

## 2017-05-27 MED ORDER — ATENOLOL 25 MG PO TABS
50.0000 mg | ORAL_TABLET | Freq: Every day | ORAL | Status: DC
  Administered 2017-05-28 – 2017-05-30 (×3): 50 mg via ORAL
  Filled 2017-05-27: qty 1
  Filled 2017-05-27: qty 2
  Filled 2017-05-27: qty 1

## 2017-05-27 MED ORDER — PHENYLEPHRINE HCL 10 MG/ML IJ SOLN
INTRAVENOUS | Status: DC | PRN
  Administered 2017-05-27: 50 ug/min via INTRAVENOUS

## 2017-05-27 MED ORDER — STROKE: EARLY STAGES OF RECOVERY BOOK
Freq: Once | Status: DC
  Filled 2017-05-27: qty 1

## 2017-05-27 MED ORDER — SODIUM CHLORIDE 0.9 % IV SOLN
INTRAVENOUS | Status: DC | PRN
  Administered 2017-05-27 (×3): via INTRAVENOUS

## 2017-05-27 MED ORDER — TICAGRELOR 60 MG PO TABS
ORAL_TABLET | ORAL | Status: DC | PRN
  Administered 2017-05-27: 180 mg

## 2017-05-27 MED ORDER — EPTIFIBATIDE 20 MG/10ML IV SOLN
INTRAVENOUS | Status: DC | PRN
  Administered 2017-05-27 (×3): 1.5 mg via INTRAVENOUS

## 2017-05-27 MED ORDER — TIMOLOL MALEATE 0.5 % OP SOLG
1.0000 [drp] | Freq: Every morning | OPHTHALMIC | Status: DC
  Administered 2017-05-28 – 2017-05-30 (×3): 1 [drp] via OPHTHALMIC
  Filled 2017-05-27: qty 5

## 2017-05-27 MED ORDER — SODIUM CHLORIDE 0.9 % IV SOLN
INTRAVENOUS | Status: DC
  Administered 2017-05-28: 05:00:00 via INTRAVENOUS

## 2017-05-27 MED ORDER — SUGAMMADEX SODIUM 500 MG/5ML IV SOLN
INTRAVENOUS | Status: DC | PRN
  Administered 2017-05-27: 500 mg via INTRAVENOUS

## 2017-05-27 MED ORDER — ROCURONIUM 10MG/ML (10ML) SYRINGE FOR MEDFUSION PUMP - OPTIME
INTRAVENOUS | Status: DC | PRN
  Administered 2017-05-27: 20 mg via INTRAVENOUS
  Administered 2017-05-27: 10 mg via INTRAVENOUS
  Administered 2017-05-27: 20 mg via INTRAVENOUS
  Administered 2017-05-27: 50 mg via INTRAVENOUS

## 2017-05-27 MED ORDER — SUCCINYLCHOLINE 20MG/ML (10ML) SYRINGE FOR MEDFUSION PUMP - OPTIME
INTRAMUSCULAR | Status: DC | PRN
  Administered 2017-05-27: 100 mg via INTRAVENOUS

## 2017-05-27 MED ORDER — IOPAMIDOL (ISOVUE-370) INJECTION 76%
INTRAVENOUS | Status: AC
  Administered 2017-05-27: 100 mL
  Filled 2017-05-27: qty 100

## 2017-05-27 MED ORDER — HEPARIN SOD (PORK) LOCK FLUSH 100 UNIT/ML IV SOLN
INTRAVENOUS | Status: AC
  Filled 2017-05-27: qty 5

## 2017-05-27 MED ORDER — NIFEDIPINE ER OSMOTIC RELEASE 90 MG PO TB24
90.0000 mg | ORAL_TABLET | Freq: Every day | ORAL | Status: DC
  Filled 2017-05-27: qty 1

## 2017-05-27 MED ORDER — SIMVASTATIN 40 MG PO TABS
40.0000 mg | ORAL_TABLET | Freq: Every day | ORAL | Status: DC
  Administered 2017-05-28 – 2017-05-29 (×2): 40 mg via ORAL
  Filled 2017-05-27 (×2): qty 1

## 2017-05-27 MED ORDER — LATANOPROST 0.005 % OP SOLN
1.0000 [drp] | Freq: Every day | OPHTHALMIC | Status: DC
  Administered 2017-05-28 – 2017-05-29 (×3): 1 [drp] via OPHTHALMIC
  Filled 2017-05-27: qty 2.5

## 2017-05-27 MED ORDER — IOPAMIDOL (ISOVUE-300) INJECTION 61%
INTRAVENOUS | Status: AC
  Filled 2017-05-27: qty 300

## 2017-05-27 NOTE — Progress Notes (Signed)
Called to [phone] and [phone], no answer from transporter on either phone. Emma from xray states she will put a note in teletracking to have bed brought to IR.

## 2017-05-27 NOTE — Procedures (Signed)
S/P RT common carotid arteriogram RT CFA approach. Findings.  1.Occluded RT MCA M 1 seg S/P complete revascularization with x 3 passes with solitaisre FR 40mm x 40 mm and x 1 pass with theTrevoprovue 72mm x 30 mm retriever devices achieving a TICI 2 b  revascularization

## 2017-05-27 NOTE — ED Triage Notes (Signed)
Patient from home with acute left sided neglect.  Began at 1630.  No blood thinners.  CT done with neurologist.  Awaiting IR consult to make final treatment decision.

## 2017-05-27 NOTE — Anesthesia Preprocedure Evaluation (Signed)
Anesthesia Evaluation  Patient identified by MRN, date of birth, ID band Patient awake    Reviewed: Allergy & Precautions, H&P , NPO status , Patient's Chart, lab work & pertinent test results  Airway Mallampati: II   Neck ROM: full    Dental   Pulmonary neg pulmonary ROS,    breath sounds clear to auscultation       Cardiovascular hypertension, + dysrhythmias Atrial Fibrillation  Rhythm:irregular Rate:Normal     Neuro/Psych Acute CVA. Left side weak CVA    GI/Hepatic   Endo/Other    Renal/GU      Musculoskeletal   Abdominal   Peds  Hematology   Anesthesia Other Findings   Reproductive/Obstetrics                             Anesthesia Physical Anesthesia Plan  ASA: III and emergent  Anesthesia Plan: General   Post-op Pain Management:    Induction: Intravenous  PONV Risk Score and Plan: 2 and Ondansetron, Dexamethasone and Treatment may vary due to age or medical condition  Airway Management Planned: Oral ETT  Additional Equipment: Arterial line  Intra-op Plan:   Post-operative Plan: Extubation in OR  Informed Consent: I have reviewed the patients History and Physical, chart, labs and discussed the procedure including the risks, benefits and alternatives for the proposed anesthesia with the patient or authorized representative who has indicated his/her understanding and acceptance.     Plan Discussed with: CRNA, Anesthesiologist and Surgeon  Anesthesia Plan Comments:         Anesthesia Quick Evaluation

## 2017-05-27 NOTE — Progress Notes (Signed)
Balloon up in carotid,penumbra suction on

## 2017-05-27 NOTE — H&P (Signed)
Admission H&P    Chief Complaint: Acute onset of left sided weakness  HPI: Derek Hays is an 84 y.o. male presenting with acute onset of left-sided weakness noted by wife at 4:20 PM. His wife states that prior to this he was in his usual state of health and they ate dinner at 4 PM. At 4:20 he slumped to the left while holding a paper in his right hand.  His speech became garbled.  He has a history of atrial fibrillation and had been taken off of blood thinners due to prior "bleeding in the head" per his wife.  He has no prior history of stroke.  He has no prior history of intracerebral hemorrhage or surgery.  EKG shows NSR and LBBB.   LSN: 4:20 PM tPA Given: No: Completed subacute strokes seen on CT head  Past Medical History:  Diagnosis Date  . Atrial fibrillation ([place])   . Hypertension     History reviewed. No pertinent surgical history.  History reviewed. No pertinent family history. Social History:  reports that  has never smoked. he has never used smokeless tobacco. He reports that he drinks alcohol. He reports that he does not use drugs.  Allergies: No Known Allergies  Home Medications:   ROS: As per HPI. No pain complaints.   Physical Examination: Blood pressure 139/84, pulse 79, temperature 97.8 F (36.6 C), temperature source Oral, resp. rate 15, SpO2 94 %.  HEENT-  [place] II/AT  Cardiovascular - RRR Lungs - Breath sounds normal. Respirations unlabored.  Extremities - Warm and well perfused  Neurologic Examination: Mental Status: Awake with decreased attention, worse towards the left. Has poor insight regarding current left sided deficit. Able to answer questions with dysarthric, fluent speech. Naming intact.  Cranial Nerves: II:  No blink to threat on the left. PERRL.  III,IV, VI: Rightward gaze preference. Has difficulty crossing midline to left.  V,VII: Left facial droop. Decreased FT sensation to left side of face. VIII: HOH IX,X: Mild hypophonia XI: Head  preferentially turns to right XII: midline tongue extension  Motor: RUE and RLE 5/5 LUE 2-3/5 proximal and distal LLE 4/5 proximal and distal Decreased tone on the left Sensory: Severely decreased FT and pressure sensation to LUE and LLE Deep Tendon Reflexes:  No pathological reflexes. No definite asymmetry.  Plantars: Upgoing bilaterally Cerebellar: No ataxia with FNF on the right. Unable to perform on the left. Bilateral dyssinergia with heel-shin Gait: Deferred  Results for orders placed or performed during the hospital encounter of 05/27/17 (from the past 48 hour(s))  Protime-INR     Status: None   Collection Time: 05/27/17  5:30 PM  Result Value Ref Range   Prothrombin Time 13.2 11.4 - 15.2 seconds   INR 1.01     Comment: Performed at [facility] [address], [place], [place] [address]  APTT     Status: None   Collection Time: 05/27/17  5:30 PM  Result Value Ref Range   aPTT 30 24 - 36 seconds    Comment: Performed at [facility] [address] [place] [address]  CBC     Status: None   Collection Time: 05/27/17  5:30 PM  Result Value Ref Range   WBC 7.5 4.0 - 10.5 K/uL   RBC 4.74 4.22 - 5.81 MIL/uL   Hemoglobin 14.7 13.0 - 17.0 g/dL   HCT 42.4 39.0 - 52.0 %   MCV 89.5 78.0 - 100.0 fL   MCH 31.0  26.0 - 34.0 pg   MCHC 34.7 30.0 - 36.0 g/dL   RDW 13.5 11.5 - 15.5 %   Platelets 218 150 - 400 K/uL    Comment: Performed at [facility], [place] [address], [place], [place] [address]  Differential     Status: None   Collection Time: 05/27/17  5:30 PM  Result Value Ref Range   Neutrophils Relative % 64 %   Neutro Abs 4.8 1.7 - 7.7 K/uL   Lymphocytes Relative 23 %   Lymphs Abs 1.7 0.7 - 4.0 K/uL   Monocytes Relative 10 %   Monocytes Absolute 0.7 0.1 - 1.0 K/uL   Eosinophils Relative 2 %   Eosinophils Absolute 0.2 0.0 - 0.7 K/uL   Basophils Relative 1 %   Basophils Absolute 0.1 0.0 - 0.1 K/uL    Comment: Performed at [facility] [address] [place], [place] [address]  Comprehensive metabolic panel     Status: Abnormal   Collection Time: 05/27/17  5:30 PM  Result Value Ref Range   Sodium 139 135 - 145 mmol/L   Potassium 4.2 3.5 - 5.1 mmol/L   Chloride 105 101 - 111 mmol/L   CO2 24 22 - 32 mmol/L   Glucose, Bld 128 (H) 65 - 99 mg/dL   BUN 26 (H) 6 - 20 mg/dL   Creatinine, Ser 1.46 (H) 0.61 - 1.24 mg/dL   Calcium 9.3 8.9 - 10.3 mg/dL   Total Protein 6.9 6.5 - 8.1 g/dL   Albumin 3.9 3.5 - 5.0 g/dL   AST 22 15 - 41 U/L   ALT 10 (L) 17 - 63 U/L   Alkaline Phosphatase 76 38 - 126 U/L   Total Bilirubin 1.1 0.3 - 1.2 mg/dL   GFR calc non Af Amer 42 (L) >60 mL/min   GFR calc Af Amer 49 (L) >60 mL/min    Comment: (NOTE) The eGFR has been calculated using the CKD EPI equation. This calculation has not been validated in all clinical situations. eGFR's persistently <60 mL/min signify possible Chronic Kidney Disease.    Anion gap 10 5 - 15    Comment: Performed at [facility] [address] [place], [place] [address]  CBG monitoring, ED     Status: Abnormal   Collection Time: 05/27/17  5:32 PM  Result Value Ref Range   Glucose-Capillary 128 (H) 65 - 99 mg/dL  I-stat troponin, ED     Status: None   Collection Time: 05/27/17  5:38 PM  Result Value Ref Range   Troponin i, poc 0.00 0.00 - 0.08 ng/mL   Comment 3            Comment: Due to the release kinetics of cTnI, a negative result within the first hours of the onset of symptoms does not rule out myocardial infarction with certainty. If myocardial infarction is still suspected, repeat the test at appropriate intervals.   I-Stat Chem 8, ED     Status: Abnormal   Collection Time: 05/27/17  5:39 PM  Result Value Ref Range   Sodium 140 135 - 145 mmol/L   Potassium 4.1 3.5 - 5.1 mmol/L   Chloride 105 101 - 111 mmol/L   BUN 27 (H) 6 - 20 mg/dL   Creatinine, Ser 1.40 (H) 0.61 - 1.24 mg/dL   Glucose, Bld 128 (H) 65 - 99 mg/dL   Calcium, Ion 1.04 (L)  1.15 - 1.40 mmol/L   TCO2 23 22 -  32 mmol/L   Hemoglobin 14.6 13.0 - 17.0 g/dL   HCT 43.0 39.0 - 52.0 %   Ct Angio Head W Or Wo Contrast  Result Date: 05/27/2017 CLINICAL DATA:  Code stroke.  Left-sided deficits. EXAM: CT ANGIOGRAPHY HEAD AND NECK CT PERFUSION BRAIN TECHNIQUE: Multidetector CT imaging of the head and neck was performed using the standard protocol during bolus administration of intravenous contrast. Multiplanar CT image reconstructions and MIPs were obtained to evaluate the vascular anatomy. Carotid stenosis measurements (when applicable) are obtained utilizing NASCET criteria, using the distal internal carotid diameter as the denominator. Multiphase CT imaging of the brain was performed following IV bolus contrast injection. Subsequent parametric perfusion maps were calculated using RAPID software. CONTRAST:  90 mL Isovue 370 COMPARISON:  Head CT 05/27/2017 FINDINGS: CTA NECK FINDINGS Aortic arch: There is moderate calcific atherosclerosis of the aortic arch. There is no aneurysm, dissection or hemodynamically significant stenosis of the visualized ascending aorta and aortic arch. Conventional 3 vessel aortic branching pattern. The visualized proximal subclavian arteries are normal. Right carotid system: The right common carotid origin is widely patent. There is no common carotid or internal carotid artery dissection or aneurysm. Atherosclerotic calcification at the carotid bifurcation without hemodynamically significant stenosis. Left carotid system: The left common carotid origin is widely patent. There is no common carotid or internal carotid artery dissection or aneurysm. Atherosclerotic calcification at the carotid bifurcation without hemodynamically significant stenosis. Vertebral arteries: The vertebral system is left dominant. Both vertebral artery origins are normal. Both vertebral arteries are normal to their confluence with the basilar artery. Skeleton: Multilevel cervical facet  arthrosis. No bony spinal canal stenosis. No skull lesion. Other neck: The nasopharynx is clear. The oropharynx and hypopharynx are normal. The epiglottis is normal. The supraglottic larynx, glottis and subglottic larynx are normal. No retropharyngeal collection. The parapharyngeal spaces are preserved. The parotid and submandibular glands are normal. No sialolithiasis or salivary ductal dilatation. The thyroid gland is normal. There is no cervical lymphadenopathy. Upper chest: Incompletely visualized right pleural effusion. Review of the MIP images confirms the above findings CTA HEAD FINDINGS Anterior circulation: --Intracranial internal carotid arteries: Normal. --Anterior cerebral arteries: The right middle cerebral artery M 1 segment is occluded at its distal aspect. There is very poor collateralization in the right MCA territory. The left middle cerebral artery is normal. --Middle cerebral arteries: Normal. --Posterior communicating arteries: Absent bilaterally. Posterior circulation: --Posterior cerebral arteries: Normal. --Superior cerebellar arteries: Normal. --Basilar artery: Normal. --Anterior inferior cerebellar arteries: Normal. --Posterior inferior cerebellar arteries: Normal. Venous sinuses: As permitted by contrast timing, patent. Anatomic variants: None Delayed phase: Not performed. 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 Review of the MIP images confirms the above findings. CT Brain Perfusion Findings: CBF (<30%) Volume: 66m Perfusion (Tmax>6.0s) volume: 1651mMismatch Volume: 10831mnfarction Location:Right MCA territory IMPRESSION: 1. Emergent large vessel occlusion of the distal M1 segment of the right middle cerebral artery. Very poor collateralization throughout the right MCA territory. 2. Right MCA territory infarct of 52 mL with 108 mL ischemic penumbra. 3. No midline shift or other mass effect.  No acute hemorrhage. 4. Hila convexity meningioma. 5. Carotid and aortic  atherosclerosis (ICD10-I70.0) without hemodynamically significant stenosis of the carotid or vertebral arteries. These results were communicated to Dr. EriKerney Elbe 6:27 pm on 05/27/2017 by text page via the [employer] system. I am currently awaiting confirmatory telephone call. Electronically Signed   By: KevUlyses JarredD.   On: 05/27/2017 18:33  Ct Angio Neck W Or Wo Contrast  Result Date: 05/27/2017 CLINICAL DATA:  Code stroke.  Left-sided deficits. EXAM: CT ANGIOGRAPHY HEAD AND NECK CT PERFUSION BRAIN TECHNIQUE: Multidetector CT imaging of the head and neck was performed using the standard protocol during bolus administration of intravenous contrast. Multiplanar CT image reconstructions and MIPs were obtained to evaluate the vascular anatomy. Carotid stenosis measurements (when applicable) are obtained utilizing NASCET criteria, using the distal internal carotid diameter as the denominator. Multiphase CT imaging of the brain was performed following IV bolus contrast injection. Subsequent parametric perfusion maps were calculated using RAPID software. CONTRAST:  90 mL Isovue 370 COMPARISON:  Head CT 05/27/2017 FINDINGS: CTA NECK FINDINGS Aortic arch: There is moderate calcific atherosclerosis of the aortic arch. There is no aneurysm, dissection or hemodynamically significant stenosis of the visualized ascending aorta and aortic arch. Conventional 3 vessel aortic branching pattern. The visualized proximal subclavian arteries are normal. Right carotid system: The right common carotid origin is widely patent. There is no common carotid or internal carotid artery dissection or aneurysm. Atherosclerotic calcification at the carotid bifurcation without hemodynamically significant stenosis. Left carotid system: The left common carotid origin is widely patent. There is no common carotid or internal carotid artery dissection or aneurysm. Atherosclerotic calcification at the carotid bifurcation without  hemodynamically significant stenosis. Vertebral arteries: The vertebral system is left dominant. Both vertebral artery origins are normal. Both vertebral arteries are normal to their confluence with the basilar artery. Skeleton: Multilevel cervical facet arthrosis. No bony spinal canal stenosis. No skull lesion. Other neck: The nasopharynx is clear. The oropharynx and hypopharynx are normal. The epiglottis is normal. The supraglottic larynx, glottis and subglottic larynx are normal. No retropharyngeal collection. The parapharyngeal spaces are preserved. The parotid and submandibular glands are normal. No sialolithiasis or salivary ductal dilatation. The thyroid gland is normal. There is no cervical lymphadenopathy. Upper chest: Incompletely visualized right pleural effusion. Review of the MIP images confirms the above findings CTA HEAD FINDINGS Anterior circulation: --Intracranial internal carotid arteries: Normal. --Anterior cerebral arteries: The right middle cerebral artery M 1 segment is occluded at its distal aspect. There is very poor collateralization in the right MCA territory. The left middle cerebral artery is normal. --Middle cerebral arteries: Normal. --Posterior communicating arteries: Absent bilaterally. Posterior circulation: --Posterior cerebral arteries: Normal. --Superior cerebellar arteries: Normal. --Basilar artery: Normal. --Anterior inferior cerebellar arteries: Normal. --Posterior inferior cerebellar arteries: Normal. Venous sinuses: As permitted by contrast timing, patent. Anatomic variants: None Delayed phase: Not performed. 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 Review of the MIP images confirms the above findings. CT Brain Perfusion Findings: CBF (<30%) Volume: 18m Perfusion (Tmax>6.0s) volume: 1638mMismatch Volume: 10866mnfarction Location:Right MCA territory IMPRESSION: 1. Emergent large vessel occlusion of the distal M1 segment of the right middle cerebral artery. Very poor  collateralization throughout the right MCA territory. 2. Right MCA territory infarct of 52 mL with 108 mL ischemic penumbra. 3. No midline shift or other mass effect.  No acute hemorrhage. 4. Hila convexity meningioma. 5. Carotid and aortic atherosclerosis (ICD10-I70.0) without hemodynamically significant stenosis of the carotid or vertebral arteries. These results were communicated to Dr. EriKerney Elbe 6:27 pm on 05/27/2017 by text page via the [facility]aging system. I am currently awaiting confirmatory telephone call. Electronically Signed   By: KevUlyses JarredD.   On: 05/27/2017 18:33   Ct Cerebral Perfusion W Contrast  Result Date: 05/27/2017 CLINICAL DATA:  Code stroke.  Left-sided deficits. EXAM: CT ANGIOGRAPHY  HEAD AND NECK CT PERFUSION BRAIN TECHNIQUE: Multidetector CT imaging of the head and neck was performed using the standard protocol during bolus administration of intravenous contrast. Multiplanar CT image reconstructions and MIPs were obtained to evaluate the vascular anatomy. Carotid stenosis measurements (when applicable) are obtained utilizing NASCET criteria, using the distal internal carotid diameter as the denominator. Multiphase CT imaging of the brain was performed following IV bolus contrast injection. Subsequent parametric perfusion maps were calculated using RAPID software. CONTRAST:  90 mL Isovue 370 COMPARISON:  Head CT 05/27/2017 FINDINGS: CTA NECK FINDINGS Aortic arch: There is moderate calcific atherosclerosis of the aortic arch. There is no aneurysm, dissection or hemodynamically significant stenosis of the visualized ascending aorta and aortic arch. Conventional 3 vessel aortic branching pattern. The visualized proximal subclavian arteries are normal. Right carotid system: The right common carotid origin is widely patent. There is no common carotid or internal carotid artery dissection or aneurysm. Atherosclerotic calcification at the carotid bifurcation without hemodynamically  significant stenosis. Left carotid system: The left common carotid origin is widely patent. There is no common carotid or internal carotid artery dissection or aneurysm. Atherosclerotic calcification at the carotid bifurcation without hemodynamically significant stenosis. Vertebral arteries: The vertebral system is left dominant. Both vertebral artery origins are normal. Both vertebral arteries are normal to their confluence with the basilar artery. Skeleton: Multilevel cervical facet arthrosis. No bony spinal canal stenosis. No skull lesion. Other neck: The nasopharynx is clear. The oropharynx and hypopharynx are normal. The epiglottis is normal. The supraglottic larynx, glottis and subglottic larynx are normal. No retropharyngeal collection. The parapharyngeal spaces are preserved. The parotid and submandibular glands are normal. No sialolithiasis or salivary ductal dilatation. The thyroid gland is normal. There is no cervical lymphadenopathy. Upper chest: Incompletely visualized right pleural effusion. Review of the MIP images confirms the above findings CTA HEAD FINDINGS Anterior circulation: --Intracranial internal carotid arteries: Normal. --Anterior cerebral arteries: The right middle cerebral artery M 1 segment is occluded at its distal aspect. There is very poor collateralization in the right MCA territory. The left middle cerebral artery is normal. --Middle cerebral arteries: Normal. --Posterior communicating arteries: Absent bilaterally. Posterior circulation: --Posterior cerebral arteries: Normal. --Superior cerebellar arteries: Normal. --Basilar artery: Normal. --Anterior inferior cerebellar arteries: Normal. --Posterior inferior cerebellar arteries: Normal. Venous sinuses: As permitted by contrast timing, patent. Anatomic variants: None Delayed phase: Not performed. 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 Review of the MIP images confirms the above findings. CT Brain Perfusion Findings: CBF  (<30%) Volume: 66m Perfusion (Tmax>6.0s) volume: 1650mMismatch Volume: 10839mnfarction Location:Right MCA territory IMPRESSION: 1. Emergent large vessel occlusion of the distal M1 segment of the right middle cerebral artery. Very poor collateralization throughout the right MCA territory. 2. Right MCA territory infarct of 52 mL with 108 mL ischemic penumbra. 3. No midline shift or other mass effect.  No acute hemorrhage. 4. Hila convexity meningioma. 5. Carotid and aortic atherosclerosis (ICD10-I70.0) without hemodynamically significant stenosis of the carotid or vertebral arteries. These results were communicated to Dr. EriKerney Elbe 6:27 pm on 05/27/2017 by text page via the [facility] system. I am currently awaiting confirmatory telephone call. Electronically Signed   By: KevUlyses JarredD.   On: 05/27/2017 18:33   Ct Head Code Stroke Wo Contrast  Result Date: 05/27/2017 CLINICAL DATA:  Code stroke. [age]ar old male with left side neurologic deficits. Last seen normal 1630 hrs. EXAM: CT HEAD WITHOUT CONTRAST TECHNIQUE: Contiguous axial images were obtained from the base of  the skull through the vertex without intravenous contrast. COMPARISON:  No prior head CT. FINDINGS: Brain: Confluent hypodensity in the right inferior frontal gyrus near the operculum appears mostly confined to the white matter. No other right MCA territory cytotoxic edema is identified. There is a cytotoxic edema in the medial and posterior left occipital lobe with no regional mass effect. Other scattered white matter hypodensity, but no other cytotoxic edema identified. No acute intracranial hemorrhage identified. No ventriculomegaly. No intracranial mass effect. There does appear to be a small area of chronic cortical encephalomalacia in the left posterior frontal lobe (sagittal image 49). Vascular: Hyperdense right MCA and right MCA bifurcation best demonstrated on coronal image 29 and sagittal image 19. Dominant appearing  distal left vertebral artery. Skull: No acute osseous abnormality identified. Sinuses/Orbits: Clear. Other: No acute findings. ASPECTS [facility] Stroke Program Early CT Score) - Ganglionic level infarction (caudate, lentiform nuclei, internal capsule, insula, M1-M3 cortex): 6 (abnormal right M1 segment) - Supraganglionic infarction (M4-M6 cortex): 3 Total score (0-10 with 10 being normal): 9 IMPRESSION: 1. Positive for hyperdense right MCA. Hypodensity in the right frontal lobe appears mostly confined to the white matter. ASPECTS is 9. No hemorrhage or mass effect. 2. Subacute appearing left PCA territory infarct with no hemorrhage or mass effect. 3. These results were communicated to Dr. Cheral Marker at 5:53 pmon 2/11/2019by text page via the [facility] messaging system. Electronically Signed   By: Genevie Ann M.D.   On: 05/27/2017 17:54    Assessment: 84 y.o. male presenting with acute onset of left hemiparesis. 1. CT head positive for hyperdense right MCA. Hypodensity in the right frontal lobe appears mostly confined to the white matter. ASPECTS is 9. No hemorrhage or mass effect. Also noted is a subacute appearing left PCA territory infarct with no hemorrhage or mass effect.  2. CTA confirms emergent large vessel occlusion of the distal M1 segment of the right middle cerebral artery, with very poor collateralization throughout the right MCA territory.  3. CTP shows right MCA territory infarct of 52 mL with 108 mL ischemic penumbra.  4. CTA neck reveals carotid and aortic atherosclerosis without hemodynamically significant stenosis of the carotid or vertebral arteries. 5. Stroke Risk Factors - Atrial fibrillation and HTN  6. Strokes in 2 separate vascular territories is concerning for cardiac source 7. In the context of atrial fibrillation, he was not on an anticoagulant at home as this was stopped after he had "bleeding in his head" per wife. Also not on an antiplatelet agent.  8. Has a pacemaker. Most likely will  not be able to obtain MRI   Plan: 1. The patient is not an IV tPA candidate due to completed strokes seen on CT. 2. The patient is an endovascular candidate. Discussed with Dr. Estanislado Pandy. Patient emergently transported to [place].  3. Following VIR, admit to ICU under Neurology service 4. HgbA1c, fasting lipid panel 5. If pacemaker is MRI compatible, obtain MRI brain. If not MRI compatible, follow up CT head at 24 hours will be evaluated to assess for evolution of right MCA stroke 6. TTE 7. Cardiac telemetry 8. PT consult, OT consult, Speech consult 9. No antiplatelet medications or anticoagulants. Can consider starting an antiplatelet agent if CT head at 24 hours is negative for hemorrhagic conversion. Most likely, benefits of starting ASA outweighs risks given his history of atrial fibrillation, despite history per wife of an intracranial hemorrhage.  10. DVT prophylaxis with SCDs.  11. Continue Zocor 12. BP management 13. Will need  to contact his PCP to determine what type of intracranial hemorrhage he had in the past, as it is not listed in Epic  80 minutes spent in the emergent Neurological evaluation and management of this critically ill acute stroke patient  Electronically signed: Dr. Kerney Elbe 05/27/2017, 9:04 PM

## 2017-05-27 NOTE — Anesthesia Procedure Notes (Signed)
Procedure Name: Intubation Date/Time: 05/27/2017 7:24 PM Performed by: Valetta Fuller, CRNA Pre-anesthesia Checklist: Patient identified, Emergency Drugs available, Suction available and Patient being monitored Patient Re-evaluated:Patient Re-evaluated prior to induction Oxygen Delivery Method: Circle system utilized Preoxygenation: Pre-oxygenation with 100% oxygen Induction Type: IV induction, Rapid sequence and Cricoid Pressure applied Laryngoscope Size: Miller and 2 Grade View: Grade I Tube type: Subglottic suction tube Tube size: 7.5 mm Number of attempts: 1 Airway Equipment and Method: Stylet Placement Confirmation: ETT inserted through vocal cords under direct vision,  positive ETCO2 and breath sounds checked- equal and bilateral Secured at: 24 cm Tube secured with: Tape Dental Injury: Teeth and Oropharynx as per pre-operative assessment

## 2017-05-27 NOTE — Code Documentation (Signed)
84 year old male presents to [facility] via [place] as code stroke.  Patient was at home at normal baseline - had dinner and then had sudden onset left side weakness and left facial droop and slurred speech.   LSW 1620 according to wife.  On arrival patient alert - left side weakness - left gaze preference can cross midline but slow - mild dysarthria but no aphasia - left neglect - left field cut - left facial palsy - left sensory.  NIHHS 14.  VSS - CBG 123.   CT scan shows hyperdense right MCA and subacute left PCA territory infarcts.  Dr. Cheral Marker at bedside - no acute tPA treatment per order.  CTP and CTA done.  Dr. Cheral Marker speaking with radiology and Dr. Estanislado Pandy.  Attempts to contact wife unsuccessful at this time.  Patient to ED - prepped for IR - delay secondary to family contact.  Wife arrives - speaks with Dr. Cheral Marker - permission to proceed.  IR paged.  Patient transported to IR - Dr. Estanislado Pandy present - speaking with wife and patient - handoff to IR team - Whitney RN.  Wife supported.  Hearing aids and gold wedding band given to wife.

## 2017-05-27 NOTE — Progress Notes (Signed)
Patient ID: JAXTYN LINVILLE, male   DOB: [birth year], 84 y.o.   MRN: [id number] INR. 80 yr RT H M acute onset lt sided weakness confusion gaze  gaze deviation. mRS premorbid of 1. CT NO ICH ASPECTS 9 CTA  RT MCA M1 occlusion. Subacute RT frontal and Lt  Occipital infarcts. CTP. CBF < 30 % vol 44ml. Tmax> 6.0s vol 160 ml. Mismatch 108 ml. Option of endovascular revascularization to prevent further neurological injury discussed with spouse. Procedure,reasons,risks,alternativeds al reviewed. Risks of ICH of 10 % with worsening  neuro function,vent dependency, death and inability to revascularize were all reviewed. Questions answered to spouses satisfaction . She and the patient expressed their wish to proceed with revascularization. Informed witnessed consent obtained. S.Ellen Mayol MD

## 2017-05-27 NOTE — ED Provider Notes (Signed)
[place] EMERGENCY DEPARTMENT Provider Note   CSN: [id number] Arrival date & time: 05/27/17  1728     History   Chief Complaint Chief Complaint  Patient presents with  . Code Stroke    LKW 1630  Level 5 caveat code stroke-history is obtained from the patient's wife due to his decreased ability to communicate  HPI Derek Hays is a 84 y.o. male.  HPI  84 year old male brought to the hospital due to sudden onset of left-sided weakness at 4:20 PM.  His wife states prior to this he was in his usual state of health.  They ate dinner at 4 PM.  At 420 he slumped to the left and was holding a paper in his right hand.  His speech became garbled.  He has a history of atrial fibrillation and had been taken off of blood thinners.  He has no prior history of stroke.  He has no prior history of intracerebral hemorrhage or surgery.  Past Medical History:  Diagnosis Date  . Atrial fibrillation ([place])   . Hypertension     There are no active problems to display for this patient.   History reviewed. No pertinent surgical history.     Home Medications    Prior to Admission medications   Medication Sig Start Date End Date Taking? Authorizing Provider  atenolol (TENORMIN) 50 MG tablet Take 50 mg by mouth daily.   Yes [provider]  furosemide (LASIX) 20 MG tablet Take 20 mg by mouth daily as needed for fluid.   Yes [provider]  levothyroxine (SYNTHROID, LEVOTHROID) 112 MCG tablet Take 112 mcg by mouth daily before breakfast.   Yes [provider]  NIFEdipine (ADALAT CC) 90 MG 24 hr tablet Take 90 mg by mouth daily.   Yes [provider]  pregabalin (LYRICA) 100 MG capsule Take 100 mg by mouth 2 (two) times daily.   Yes [provider]  simvastatin (ZOCOR) 40 MG tablet Take 40 mg by mouth at bedtime.   Yes [provider]  latanoprost (XALATAN) 0.005 % ophthalmic solution Place 1 drop into both eyes at  bedtime.    [provider]  timolol (BETIMOL) 0.5 % ophthalmic solution Place 1 drop into the left eye daily.    [provider]    Family History History reviewed. No pertinent family history.  Social History Social History   Tobacco Use  . Smoking status: Never Smoker  . Smokeless tobacco: Never Used  Substance Use Topics  . Alcohol use: Yes  . Drug use: No     Allergies   Patient has no known allergies.   Review of Systems Review of Systems  Unable to perform ROS: Acuity of condition     Physical Exam Updated Vital Signs BP (!) 157/79 (BP Location: Right Arm)   Pulse 60   Temp 97.8 F (36.6 C) (Oral)   Resp 20   SpO2 98%   Physical Exam  Constitutional: He appears well-developed and well-nourished.  HENT:  Head: Normocephalic.  Right Ear: External ear normal.  Left Ear: External ear normal.  Mouth/Throat: Oropharynx is clear and moist.  Eyes: Pupils are equal, round, and reactive to light.  Gaze preference to right  Neck: Normal range of motion. Neck supple.  Cardiovascular: Regular rhythm.  Pulmonary/Chest: Effort normal and breath sounds normal.  Abdominal: Soft. Bowel sounds are normal.  Musculoskeletal: Normal range of motion.  Neurological: He is alert. He displays abnormal reflex. A  cranial nerve deficit is present. He exhibits abnormal muscle tone. Coordination abnormal.  Patient is able to leg raise the left leg off the bed but unable to hold up he is able to raise the right leg and hold against gravity to count of 5 He has left upper extremity drift. He has facial asymmetry with left facial droop  Skin: Skin is warm.  Nursing note and vitals reviewed.    ED Treatments / Results  Labs (all labs ordered are listed, but only abnormal results are displayed) Labs Reviewed  COMPREHENSIVE METABOLIC PANEL - Abnormal; Notable for the following components:      Result Value   Glucose, Bld 128 (*)    BUN 26 (*)    Creatinine,  Ser 1.46 (*)    ALT 10 (*)    GFR calc non Af Amer 42 (*)    GFR calc Af Amer 49 (*)    All other components within normal limits  CBG MONITORING, ED - Abnormal; Notable for the following components:   Glucose-Capillary 128 (*)    All other components within normal limits  I-STAT CHEM 8, ED - Abnormal; Notable for the following components:   BUN 27 (*)    Creatinine, Ser 1.40 (*)    Glucose, Bld 128 (*)    Calcium, Ion 1.04 (*)    All other components within normal limits  PROTIME-INR  APTT  CBC  DIFFERENTIAL  I-STAT TROPONIN, ED    EKG  EKG Interpretation  Date/Time:  Monday May 27 2017 18:22:51 EST Ventricular Rate:  61 PR Interval:    QRS Duration: 173 QT Interval:  508 QTC Calculation: 512 R Axis:   -95 Text Interpretation:  Normal sinus rhythm Left bundle branch block No significant change since last tracing Confirmed by Pattricia Boss [phone]) on 05/27/2017 6:43:20 PM       Radiology Ct Angio Head W Or Wo Contrast  Result Date: 05/27/2017 CLINICAL DATA:  Code stroke.  Left-sided deficits. EXAM: CT ANGIOGRAPHY HEAD AND NECK CT PERFUSION BRAIN TECHNIQUE: Multidetector CT imaging of the head and neck was performed using the standard protocol during bolus administration of intravenous contrast. Multiplanar CT image reconstructions and MIPs were obtained to evaluate the vascular anatomy. Carotid stenosis measurements (when applicable) are obtained utilizing NASCET criteria, using the distal internal carotid diameter as the denominator. Multiphase CT imaging of the brain was performed following IV bolus contrast injection. Subsequent parametric perfusion maps were calculated using RAPID software. CONTRAST:  90 mL Isovue 370 COMPARISON:  Head CT 05/27/2017 FINDINGS: CTA NECK FINDINGS Aortic arch: There is moderate calcific atherosclerosis of the aortic arch. There is no aneurysm, dissection or hemodynamically significant stenosis of the visualized ascending aorta and aortic  arch. Conventional 3 vessel aortic branching pattern. The visualized proximal subclavian arteries are normal. Right carotid system: The right common carotid origin is widely patent. There is no common carotid or internal carotid artery dissection or aneurysm. Atherosclerotic calcification at the carotid bifurcation without hemodynamically significant stenosis. Left carotid system: The left common carotid origin is widely patent. There is no common carotid or internal carotid artery dissection or aneurysm. Atherosclerotic calcification at the carotid bifurcation without hemodynamically significant stenosis. Vertebral arteries: The vertebral system is left dominant. Both vertebral artery origins are normal. Both vertebral arteries are normal to their confluence with the basilar artery. Skeleton: Multilevel cervical facet arthrosis. No bony spinal canal stenosis. No skull lesion. Other neck: The nasopharynx is clear. The oropharynx and hypopharynx are normal. The epiglottis  is normal. The supraglottic larynx, glottis and subglottic larynx are normal. No retropharyngeal collection. The parapharyngeal spaces are preserved. The parotid and submandibular glands are normal. No sialolithiasis or salivary ductal dilatation. The thyroid gland is normal. There is no cervical lymphadenopathy. Upper chest: Incompletely visualized right pleural effusion. Review of the MIP images confirms the above findings CTA HEAD FINDINGS Anterior circulation: --Intracranial internal carotid arteries: Normal. --Anterior cerebral arteries: The right middle cerebral artery M 1 segment is occluded at its distal aspect. There is very poor collateralization in the right MCA territory. The left middle cerebral artery is normal. --Middle cerebral arteries: Normal. --Posterior communicating arteries: Absent bilaterally. Posterior circulation: --Posterior cerebral arteries: Normal. --Superior cerebellar arteries: Normal. --Basilar artery: Normal.  --Anterior inferior cerebellar arteries: Normal. --Posterior inferior cerebellar arteries: Normal. Venous sinuses: As permitted by contrast timing, patent. Anatomic variants: None Delayed phase: Not performed. 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 Review of the MIP images confirms the above findings. CT Brain Perfusion Findings: CBF (<30%) Volume: 57mL Perfusion (Tmax>6.0s) volume: 15mL Mismatch Volume: 122mL Infarction Location:Right MCA territory IMPRESSION: 1. Emergent large vessel occlusion of the distal M1 segment of the right middle cerebral artery. Very poor collateralization throughout the right MCA territory. 2. Right MCA territory infarct of 52 mL with 108 mL ischemic penumbra. 3. No midline shift or other mass effect.  No acute hemorrhage. 4. Hila convexity meningioma. 5. Carotid and aortic atherosclerosis (ICD10-I70.0) without hemodynamically significant stenosis of the carotid or vertebral arteries. These results were communicated to Dr. Kerney Elbe at 6:27 pm on 05/27/2017 by text page via the [facility] messaging system. I am currently awaiting confirmatory telephone call. Electronically Signed   By: Ulyses Jarred M.D.   On: 05/27/2017 18:33   Ct Angio Neck W Or Wo Contrast  Result Date: 05/27/2017 CLINICAL DATA:  Code stroke.  Left-sided deficits. EXAM: CT ANGIOGRAPHY HEAD AND NECK CT PERFUSION BRAIN TECHNIQUE: Multidetector CT imaging of the head and neck was performed using the standard protocol during bolus administration of intravenous contrast. Multiplanar CT image reconstructions and MIPs were obtained to evaluate the vascular anatomy. Carotid stenosis measurements (when applicable) are obtained utilizing NASCET criteria, using the distal internal carotid diameter as the denominator. Multiphase CT imaging of the brain was performed following IV bolus contrast injection. Subsequent parametric perfusion maps were calculated using RAPID software. CONTRAST:  90 mL Isovue 370 COMPARISON:   Head CT 05/27/2017 FINDINGS: CTA NECK FINDINGS Aortic arch: There is moderate calcific atherosclerosis of the aortic arch. There is no aneurysm, dissection or hemodynamically significant stenosis of the visualized ascending aorta and aortic arch. Conventional 3 vessel aortic branching pattern. The visualized proximal subclavian arteries are normal. Right carotid system: The right common carotid origin is widely patent. There is no common carotid or internal carotid artery dissection or aneurysm. Atherosclerotic calcification at the carotid bifurcation without hemodynamically significant stenosis. Left carotid system: The left common carotid origin is widely patent. There is no common carotid or internal carotid artery dissection or aneurysm. Atherosclerotic calcification at the carotid bifurcation without hemodynamically significant stenosis. Vertebral arteries: The vertebral system is left dominant. Both vertebral artery origins are normal. Both vertebral arteries are normal to their confluence with the basilar artery. Skeleton: Multilevel cervical facet arthrosis. No bony spinal canal stenosis. No skull lesion. Other neck: The nasopharynx is clear. The oropharynx and hypopharynx are normal. The epiglottis is normal. The supraglottic larynx, glottis and subglottic larynx are normal. No retropharyngeal collection. The parapharyngeal spaces are preserved. The  parotid and submandibular glands are normal. No sialolithiasis or salivary ductal dilatation. The thyroid gland is normal. There is no cervical lymphadenopathy. Upper chest: Incompletely visualized right pleural effusion. Review of the MIP images confirms the above findings CTA HEAD FINDINGS Anterior circulation: --Intracranial internal carotid arteries: Normal. --Anterior cerebral arteries: The right middle cerebral artery M 1 segment is occluded at its distal aspect. There is very poor collateralization in the right MCA territory. The left middle cerebral  artery is normal. --Middle cerebral arteries: Normal. --Posterior communicating arteries: Absent bilaterally. Posterior circulation: --Posterior cerebral arteries: Normal. --Superior cerebellar arteries: Normal. --Basilar artery: Normal. --Anterior inferior cerebellar arteries: Normal. --Posterior inferior cerebellar arteries: Normal. Venous sinuses: As permitted by contrast timing, patent. Anatomic variants: None Delayed phase: Not performed. 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 Review of the MIP images confirms the above findings. CT Brain Perfusion Findings: CBF (<30%) Volume: 6mL Perfusion (Tmax>6.0s) volume: 163mL Mismatch Volume: 179mL Infarction Location:Right MCA territory IMPRESSION: 1. Emergent large vessel occlusion of the distal M1 segment of the right middle cerebral artery. Very poor collateralization throughout the right MCA territory. 2. Right MCA territory infarct of 52 mL with 108 mL ischemic penumbra. 3. No midline shift or other mass effect.  No acute hemorrhage. 4. Hila convexity meningioma. 5. Carotid and aortic atherosclerosis (ICD10-I70.0) without hemodynamically significant stenosis of the carotid or vertebral arteries. These results were communicated to Dr. Kerney Elbe at 6:27 pm on 05/27/2017 by text page via the [facility] messaging system. I am currently awaiting confirmatory telephone call. Electronically Signed   By: Ulyses Jarred M.D.   On: 05/27/2017 18:33   Ct Cerebral Perfusion W Contrast  Result Date: 05/27/2017 CLINICAL DATA:  Code stroke.  Left-sided deficits. EXAM: CT ANGIOGRAPHY HEAD AND NECK CT PERFUSION BRAIN TECHNIQUE: Multidetector CT imaging of the head and neck was performed using the standard protocol during bolus administration of intravenous contrast. Multiplanar CT image reconstructions and MIPs were obtained to evaluate the vascular anatomy. Carotid stenosis measurements (when applicable) are obtained utilizing NASCET criteria, using the distal internal  carotid diameter as the denominator. Multiphase CT imaging of the brain was performed following IV bolus contrast injection. Subsequent parametric perfusion maps were calculated using RAPID software. CONTRAST:  90 mL Isovue 370 COMPARISON:  Head CT 05/27/2017 FINDINGS: CTA NECK FINDINGS Aortic arch: There is moderate calcific atherosclerosis of the aortic arch. There is no aneurysm, dissection or hemodynamically significant stenosis of the visualized ascending aorta and aortic arch. Conventional 3 vessel aortic branching pattern. The visualized proximal subclavian arteries are normal. Right carotid system: The right common carotid origin is widely patent. There is no common carotid or internal carotid artery dissection or aneurysm. Atherosclerotic calcification at the carotid bifurcation without hemodynamically significant stenosis. Left carotid system: The left common carotid origin is widely patent. There is no common carotid or internal carotid artery dissection or aneurysm. Atherosclerotic calcification at the carotid bifurcation without hemodynamically significant stenosis. Vertebral arteries: The vertebral system is left dominant. Both vertebral artery origins are normal. Both vertebral arteries are normal to their confluence with the basilar artery. Skeleton: Multilevel cervical facet arthrosis. No bony spinal canal stenosis. No skull lesion. Other neck: The nasopharynx is clear. The oropharynx and hypopharynx are normal. The epiglottis is normal. The supraglottic larynx, glottis and subglottic larynx are normal. No retropharyngeal collection. The parapharyngeal spaces are preserved. The parotid and submandibular glands are normal. No sialolithiasis or salivary ductal dilatation. The thyroid gland is normal. There is no cervical lymphadenopathy.  Upper chest: Incompletely visualized right pleural effusion. Review of the MIP images confirms the above findings CTA HEAD FINDINGS Anterior circulation:  --Intracranial internal carotid arteries: Normal. --Anterior cerebral arteries: The right middle cerebral artery M 1 segment is occluded at its distal aspect. There is very poor collateralization in the right MCA territory. The left middle cerebral artery is normal. --Middle cerebral arteries: Normal. --Posterior communicating arteries: Absent bilaterally. Posterior circulation: --Posterior cerebral arteries: Normal. --Superior cerebellar arteries: Normal. --Basilar artery: Normal. --Anterior inferior cerebellar arteries: Normal. --Posterior inferior cerebellar arteries: Normal. Venous sinuses: As permitted by contrast timing, patent. Anatomic variants: None Delayed phase: Not performed. 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 Review of the MIP images confirms the above findings. CT Brain Perfusion Findings: CBF (<30%) Volume: 18mL Perfusion (Tmax>6.0s) volume: 169mL Mismatch Volume: 16mL Infarction Location:Right MCA territory IMPRESSION: 1. Emergent large vessel occlusion of the distal M1 segment of the right middle cerebral artery. Very poor collateralization throughout the right MCA territory. 2. Right MCA territory infarct of 52 mL with 108 mL ischemic penumbra. 3. No midline shift or other mass effect.  No acute hemorrhage. 4. Hila convexity meningioma. 5. Carotid and aortic atherosclerosis (ICD10-I70.0) without hemodynamically significant stenosis of the carotid or vertebral arteries. These results were communicated to Dr. Kerney Elbe at 6:27 pm on 05/27/2017 by text page via the [facility] messaging system. I am currently awaiting confirmatory telephone call. Electronically Signed   By: Ulyses Jarred M.D.   On: 05/27/2017 18:33   Ct Head Code Stroke Wo Contrast  Result Date: 05/27/2017 CLINICAL DATA:  Code stroke. 84 year old male with left side neurologic deficits. Last seen normal 1630 hrs. EXAM: CT HEAD WITHOUT CONTRAST TECHNIQUE: Contiguous axial images were obtained from the base of the skull  through the vertex without intravenous contrast. COMPARISON:  No prior head CT. FINDINGS: Brain: Confluent hypodensity in the right inferior frontal gyrus near the operculum appears mostly confined to the white matter. No other right MCA territory cytotoxic edema is identified. There is a cytotoxic edema in the medial and posterior left occipital lobe with no regional mass effect. Other scattered white matter hypodensity, but no other cytotoxic edema identified. No acute intracranial hemorrhage identified. No ventriculomegaly. No intracranial mass effect. There does appear to be a small area of chronic cortical encephalomalacia in the left posterior frontal lobe (sagittal image 49). Vascular: Hyperdense right MCA and right MCA bifurcation best demonstrated on coronal image 29 and sagittal image 19. Dominant appearing distal left vertebral artery. Skull: No acute osseous abnormality identified. Sinuses/Orbits: Clear. Other: No acute findings. ASPECTS [facility] Stroke Program Early CT Score) - Ganglionic level infarction (caudate, lentiform nuclei, internal capsule, insula, M1-M3 cortex): 6 (abnormal right M1 segment) - Supraganglionic infarction (M4-M6 cortex): 3 Total score (0-10 with 10 being normal): 9 IMPRESSION: 1. Positive for hyperdense right MCA. Hypodensity in the right frontal lobe appears mostly confined to the white matter. ASPECTS is 9. No hemorrhage or mass effect. 2. Subacute appearing left PCA territory infarct with no hemorrhage or mass effect. 3. These results were communicated to Dr. Cheral Marker at 5:53 pmon 2/11/2019by text page via the [facility] messaging system. Electronically Signed   By: Genevie Ann M.D.   On: 05/27/2017 17:54    Procedures Procedures (including critical care time)  Medications Ordered in ED Medications  iopamidol (ISOVUE-370) 76 % injection (100 mLs  Contrast Given 05/27/17 1747)     Initial Impression / Assessment and Plan / ED Course  I have reviewed the  triage vital signs  and the nursing notes.  Pertinent labs & imaging results that were available during my care of the patient were reviewed by me and considered in my medical decision making (see chart for details).     Patient seen and evaluated by stroke team concurrently.  Patient with hyperdense right mca sign on ct.  Patient being prepped for IR at this time per neurology.  Vitals:   05/27/17 1826 05/27/17 1832  BP: (!) 157/79 (!) 145/77  Pulse: 60 60  Resp: 20 (!) 22  Temp: 97.8 F (36.6 C)   SpO2: 98% 94%   1- stroke- per neurology patient to IR 2-ckd- creatinine at 1.4 and has ranged 1.2-1.54  CRITICAL CARE Performed by: Pattricia Boss Total critical care time: 30 minutes Critical care time was exclusive of separately billable procedures and treating other patients. Critical care was necessary to treat or prevent imminent or life-threatening deterioration. Critical care was time spent personally by me on the following activities: development of treatment plan with patient and/or surrogate as well as nursing, discussions with consultants, evaluation of patient's response to treatment, examination of patient, obtaining history from patient or surrogate, ordering and performing treatments and interventions, ordering and review of laboratory studies, ordering and review of radiographic studies, pulse oximetry and re-evaluation of patient's condition.   Final Clinical Impressions(s) / ED Diagnoses   Final diagnoses:  Cerebrovascular accident (CVA), unspecified mechanism [facility])    ED Discharge Orders    None       Pattricia Boss, MD 05/27/17 1850

## 2017-05-27 NOTE — ED Notes (Signed)
EMERGENCY DEPARTMENT  US GUIDANCE EXAM Emergency Ultrasound:  US Guidance for Needle Guidance  INDICATIONS: Difficult vascular access Linear probe used in real-time to visualize location of needle entry through skin.   PERFORMED BY: Myself IMAGES ARCHIVED?: No LIMITATIONS: None VIEWS USED: Transverse INTERPRETATION: Needle visualized within vein   Tolerated well w/no complications.   Aaron Edelman RN  6:39 PM 05/27/17

## 2017-05-27 NOTE — Progress Notes (Signed)
Balloon up in carotid, penumbra suction on.

## 2017-05-27 NOTE — Sedation Documentation (Signed)
Report called to 4N RN. Awaiting CRNA to extubate pt at this time.

## 2017-05-27 NOTE — Progress Notes (Signed)
Paged Dr Leonel Ramsay for admission orders to neuro ICU.

## 2017-05-27 NOTE — Anesthesia Procedure Notes (Signed)
Arterial Line Insertion Start/End2/02/2018 7:34 PM, 05/27/2017 9:44 AM Performed by: Valetta Fuller, CRNA, CRNA  Left, radial was placed Catheter size: 20 G Hand hygiene performed , maximum sterile barriers used  and Seldinger technique used  Attempts: 1 Procedure performed without using ultrasound guided technique. Following insertion, dressing applied and Biopatch. Patient tolerated the procedure well with no immediate complications.

## 2017-05-27 NOTE — Progress Notes (Signed)
Unable to get transport to bring bed from 4N15 to IR, called to 4N and spoke to [facility] and she states they will have someone bring bed down to IR.

## 2017-05-27 NOTE — Progress Notes (Addendum)
Balloon up in carotid, penumbra suction on.

## 2017-05-27 NOTE — Sedation Documentation (Signed)
Procedure finished. Pt under the care of anesthesia at this time.8Fangioseal to right groin,site is unremarkable.

## 2017-05-27 NOTE — ED Notes (Signed)
Pt transported to IR. Pt belongings given to wife including phone, hearing aids, and wedding ring.

## 2017-05-28 ENCOUNTER — Encounter (HOSPITAL_COMMUNITY): Payer: Self-pay

## 2017-05-28 ENCOUNTER — Inpatient Hospital Stay (HOSPITAL_COMMUNITY): Payer: Medicare Other

## 2017-05-28 DIAGNOSIS — G8114 Spastic hemiplegia affecting left nondominant side: Secondary | ICD-10-CM

## 2017-05-28 DIAGNOSIS — Z8679 Personal history of other diseases of the circulatory system: Secondary | ICD-10-CM

## 2017-05-28 DIAGNOSIS — I6340 Cerebral infarction due to embolism of unspecified cerebral artery: Secondary | ICD-10-CM

## 2017-05-28 DIAGNOSIS — I361 Nonrheumatic tricuspid (valve) insufficiency: Secondary | ICD-10-CM

## 2017-05-28 DIAGNOSIS — T148XXA Other injury of unspecified body region, initial encounter: Secondary | ICD-10-CM

## 2017-05-28 DIAGNOSIS — I639 Cerebral infarction, unspecified: Secondary | ICD-10-CM

## 2017-05-28 DIAGNOSIS — I10 Essential (primary) hypertension: Secondary | ICD-10-CM

## 2017-05-28 DIAGNOSIS — I69391 Dysphagia following cerebral infarction: Secondary | ICD-10-CM

## 2017-05-28 DIAGNOSIS — R414 Neurologic neglect syndrome: Secondary | ICD-10-CM

## 2017-05-28 DIAGNOSIS — I6601 Occlusion and stenosis of right middle cerebral artery: Secondary | ICD-10-CM

## 2017-05-28 DIAGNOSIS — I480 Paroxysmal atrial fibrillation: Secondary | ICD-10-CM

## 2017-05-28 LAB — CBC WITH DIFFERENTIAL/PLATELET
BASOS ABS: 0 10*3/uL (ref 0.0–0.1)
BASOS PCT: 0 %
EOS ABS: 0 10*3/uL (ref 0.0–0.7)
EOS PCT: 0 %
HCT: 36.8 % — ABNORMAL LOW (ref 39.0–52.0)
Hemoglobin: 12.6 g/dL — ABNORMAL LOW (ref 13.0–17.0)
Lymphocytes Relative: 7 %
Lymphs Abs: 0.6 10*3/uL — ABNORMAL LOW (ref 0.7–4.0)
MCH: 30.6 pg (ref 26.0–34.0)
MCHC: 34.2 g/dL (ref 30.0–36.0)
MCV: 89.3 fL (ref 78.0–100.0)
Monocytes Absolute: 0.2 10*3/uL (ref 0.1–1.0)
Monocytes Relative: 2 %
Neutro Abs: 8.4 10*3/uL — ABNORMAL HIGH (ref 1.7–7.7)
Neutrophils Relative %: 91 %
PLATELETS: 216 10*3/uL (ref 150–400)
RBC: 4.12 MIL/uL — AB (ref 4.22–5.81)
RDW: 13.3 % (ref 11.5–15.5)
WBC: 9.2 10*3/uL (ref 4.0–10.5)

## 2017-05-28 LAB — BASIC METABOLIC PANEL
ANION GAP: 12 (ref 5–15)
BUN: 24 mg/dL — ABNORMAL HIGH (ref 6–20)
CO2: 17 mmol/L — ABNORMAL LOW (ref 22–32)
Calcium: 8.6 mg/dL — ABNORMAL LOW (ref 8.9–10.3)
Chloride: 110 mmol/L (ref 101–111)
Creatinine, Ser: 1.35 mg/dL — ABNORMAL HIGH (ref 0.61–1.24)
GFR calc Af Amer: 54 mL/min — ABNORMAL LOW (ref >60)
GFR, EST NON AFRICAN AMERICAN: 47 mL/min — AB (ref >60)
Glucose, Bld: 187 mg/dL — ABNORMAL HIGH (ref 65–99)
POTASSIUM: 3.8 mmol/L (ref 3.5–5.1)
SODIUM: 139 mmol/L (ref 135–145)

## 2017-05-28 LAB — LIPID PANEL
CHOL/HDL RATIO: 3.1 ratio
CHOLESTEROL: 133 mg/dL (ref 0–200)
HDL: 43 mg/dL (ref >40)
LDL Cholesterol: 77 mg/dL (ref 0–99)
Triglycerides: 66 mg/dL (ref <150)
VLDL: 13 mg/dL (ref 0–40)

## 2017-05-28 LAB — MRSA PCR SCREENING: MRSA BY PCR: NEGATIVE

## 2017-05-28 LAB — HEMOGLOBIN A1C
Hgb A1c MFr Bld: 5.4 % (ref 4.8–5.6)
Mean Plasma Glucose: 108.28 mg/dL

## 2017-05-28 LAB — ECHOCARDIOGRAM COMPLETE

## 2017-05-28 MED ORDER — FENTANYL CITRATE (PF) 100 MCG/2ML IJ SOLN
25.0000 ug | Freq: Once | INTRAMUSCULAR | Status: AC
  Administered 2017-05-28: 25 ug via INTRAVENOUS

## 2017-05-28 MED ORDER — ACETAMINOPHEN 325 MG PO TABS: 650.0000 mg | ORAL_TABLET | ORAL | Status: DC | PRN

## 2017-05-28 MED ORDER — ACETAMINOPHEN 650 MG RE SUPP: 650.0000 mg | RECTAL | Status: DC | PRN

## 2017-05-28 MED ORDER — ORAL CARE MOUTH RINSE
15.0000 mL | Freq: Two times a day (BID) | OROMUCOSAL | Status: DC
  Administered 2017-05-28: 15 mL via OROMUCOSAL

## 2017-05-28 MED ORDER — ORAL CARE MOUTH RINSE: 15.0000 mL | Freq: Two times a day (BID) | OROMUCOSAL | Status: DC

## 2017-05-28 MED ORDER — CLEVIDIPINE BUTYRATE 0.5 MG/ML IV EMUL
0.0000 mg/h | INTRAVENOUS | Status: DC
  Administered 2017-05-28: 1 mg/h via INTRAVENOUS
  Filled 2017-05-28 (×2): qty 50

## 2017-05-28 MED ORDER — ACETAMINOPHEN 160 MG/5ML PO SOLN: 650.0000 mg | ORAL | Status: DC | PRN

## 2017-05-28 MED ORDER — ASPIRIN 300 MG RE SUPP: 300.0000 mg | Freq: Every day | RECTAL | Status: DC

## 2017-05-28 MED ORDER — FENTANYL CITRATE (PF) 100 MCG/2ML IJ SOLN
INTRAMUSCULAR | Status: AC
  Administered 2017-05-28: 25 ug
  Filled 2017-05-28: qty 2

## 2017-05-28 MED ORDER — ASPIRIN EC 325 MG PO TBEC
325.0000 mg | DELAYED_RELEASE_TABLET | Freq: Every day | ORAL | Status: DC
  Administered 2017-05-28 – 2017-05-30 (×3): 325 mg via ORAL
  Filled 2017-05-28 (×3): qty 1

## 2017-05-28 MED ORDER — NICARDIPINE HCL IN NACL 20-0.86 MG/200ML-% IV SOLN
0.0000 mg/h | INTRAVENOUS | Status: DC
  Administered 2017-05-28 (×2): 7.5 mg/h via INTRAVENOUS
  Administered 2017-05-28: 5 mg/h via INTRAVENOUS
  Filled 2017-05-28 (×3): qty 200

## 2017-05-28 MED ORDER — ENOXAPARIN SODIUM 40 MG/0.4ML ~~LOC~~ SOLN
40.0000 mg | SUBCUTANEOUS | Status: DC
  Administered 2017-05-28 – 2017-05-29 (×2): 40 mg via SUBCUTANEOUS
  Filled 2017-05-28 (×2): qty 0.4

## 2017-05-28 MED ORDER — SODIUM CHLORIDE 0.9 % IV SOLN
INTRAVENOUS | Status: DC
  Administered 2017-05-28 (×2): via INTRAVENOUS

## 2017-05-28 MED ORDER — CHLORHEXIDINE GLUCONATE 0.12 % MT SOLN
15.0000 mL | Freq: Two times a day (BID) | OROMUCOSAL | Status: DC
  Administered 2017-05-28 – 2017-05-30 (×4): 15 mL via OROMUCOSAL
  Filled 2017-05-28 (×3): qty 15

## 2017-05-28 NOTE — Progress Notes (Signed)
Scraper Progress Note Patient Name: Derek Hays DOB: 1931/03/03 MRN: [id number]   Date of Service  05/28/2017  HPI/Events of Note  Anxiety/Pain. PCCM asked to see patient by IR. However, Neurology (admitting service) says they really don't need PCCM as the patient is extubated.   eICU Interventions  Will order:  1. Fentanyl 25 mcg IV X 1 now.  2. Defer further management to Neurology unless request to see patient by Neurology.      Intervention Category Intermediate Interventions: Pain - evaluation and management  Cyrene Gharibian Cornelia Copa 05/28/2017, 12:39 AM

## 2017-05-28 NOTE — Progress Notes (Signed)
PT Cancellation Note  Patient Details Name: Derek Hays MRN: [id number] DOB: April 22, 1930   Cancelled Treatment:    Reason Eval/Treat Not Completed: Medical issues which prohibited therapy(pt with groin hematoma s/p sheath pull with current Bedrest for 4 hours. Will attempt as time and pt allows)   [name] 05/28/2017, 9:07 AM  Elwyn Reach, [place]

## 2017-05-28 NOTE — Evaluation (Signed)
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 PMhx: Afib, HTN, pacemaker   Clinical Impression   PTA, pt was living with his wife and was independent. Pt currently requiring Max A for grooming and UB ADLs, Total A for LB ADLs, and Max A +2 for functional transfer. Pt presenting with left inattention and visual field deficits, left side hemiparesis, poor balance, and decreased cognition. Pt motivated to participate in therapy and family supportive. Pt will required further acute OT to facilitate safe dc. Recommend dc to CIR for intensive OT to increase safety and independence with ADLs and functional mobility.     Follow Up Recommendations  CIR    Equipment Recommendations  Other (comment)(Defer to next venue)    Recommendations for Other Services PT consult;Rehab consult;Speech consult     Precautions / Restrictions Precautions Precautions: Fall Precaution Comments: left neglect, pusher Restrictions Weight Bearing Restrictions: No      Mobility Bed Mobility Overal bed mobility: Needs Assistance Bed Mobility: Supine to Sit;Sit to Supine     Supine to sit: Max assist;+2 for physical assistance;HOB elevated Sit to supine: Max assist;+2 for physical assistance   General bed mobility comments: assist to pivot legs to EOB and elevate trunk with HOB 35 degrees. With return to supine assist to bend onto right forearm with assist to bring legs onto surface, total assist to slide up in bed  Transfers Overall transfer level: Needs assistance   Transfers: Sit to/from Stand Sit to Stand: +2 physical assistance;Max assist         General transfer comment: bil knees blocked, cues for anterior right translation with assist to rise, pt unable  to achieve fully upright. x 2 trials with belt and pad with attempt to pivot hips toward right pt resitant despite 2 person assist and unable to budge hips. Returned to sitting for lift to chair however after pad placed lift would not function and pt returned to supine    Balance Overall balance assessment: Needs assistance Sitting-balance support: Single extremity supported;Feet supported Sitting balance-Leahy Scale: Zero Sitting balance - Comments: max assist in sitting for upright, pt with tendency to push with RUE and even with RUE on rail pt requires assist for midline and drifts into trunk flexion Postural control: Left lateral lean;Posterior lean   Standing balance-Leahy Scale: Zero                             ADL either performed or assessed with clinical judgement   ADL Overall ADL's : Needs assistance/impaired     Grooming: Wash/dry face;Maximal assistance;Sitting Grooming Details (indicate cue type and reason): Pt able to bring wash cloth to face with Max A for sitting at EOB Upper Body Bathing: Maximal assistance;Bed level   Lower Body Bathing: Total assistance;Bed level   Upper Body Dressing : Maximal assistance;Bed level   Lower Body Dressing: Total assistance;Bed level       Toileting- Clothing Manipulation and Hygiene: Total assistance;Bed level         General ADL Comments: Pt presenting with no AROM of LUE, poor cognition, inattention to left side (body and environment), and decreased balance. Pt maintaining sitting at EOB with Max A; moments with Min A. Pt pushing towards L side  during sitting and standing. Very poor attention to left side, able to track with Max verbal and visual cues.      Vision Baseline Vision/History: Wears glasses Wears Glasses: At all times Patient Visual Report: No change from baseline Vision Assessment?: Vision impaired- to be further tested in functional context;Yes Eye Alignment: Within Functional Limits Ocular  Range of Motion: Restricted on the left Alignment/Gaze Preference: Gaze right;Head turned Visual Fields: Left visual field deficit Additional Comments: Pt with significant left inattention. requiring mac verbal and visual cues to attend to left side. Pt with left side visual field cut. Will continue to assess.     Perception     Praxis      Pertinent Vitals/Pain Pain Assessment: 0-10 Pain Score: 4  Pain Location: back Pain Descriptors / Indicators: Aching Pain Intervention(s): Monitored during session;Limited activity within patient's tolerance;Repositioned     Hand Dominance Right   Extremity/Trunk Assessment Upper Extremity Assessment Upper Extremity Assessment: LUE deficits/detail LUE Deficits / Details: Brunstrom stage 1 with no active movement. Pt with neglect of left side and poor sensation LUE Sensation: decreased light touch;decreased proprioception LUE Coordination: decreased fine motor;decreased gross motor   Lower Extremity Assessment Lower Extremity Assessment: Defer to PT evaluation LLE Deficits / Details: grossly 2/5 pt able to move it spontaneously but not moving it on command   Cervical / Trunk Assessment Cervical / Trunk Assessment: Kyphotic;Other exceptions Cervical / Trunk Exceptions: right neck rotation with flexion, able to achieve midline and some left neck rotation with assist and cues   Communication Communication Communication: HOH   Cognition Arousal/Alertness: Awake/alert Behavior During Therapy: Flat affect Overall Cognitive Status: Impaired/Different from baseline Area of Impairment: Attention;Memory;Following commands;Safety/judgement;Problem solving                   Current Attention Level: Focused Memory: Decreased short-term memory Following Commands: Follows one step commands inconsistently;Follows one step commands with increased time Safety/Judgement: Decreased awareness of safety;Decreased awareness of deficits   Problem  Solving: Slow processing;Decreased initiation;Difficulty sequencing;Requires verbal cues;Requires tactile cues General Comments: left neglect,  pinching his cheek on the left stating it is a washcloth, holding his LUE with RUE stated it is this therapists arm, pt perseverating on kids and wifes location in room   General Comments  Wife Kalman Shan) and daughter Almyra Free) present throughout session    Exercises Exercises: Other exercises Other Exercises Other Exercises: Educated wife and daughter on elevating LUE, retrograde massage, and PROM for hand to manage edema and increase awarness and sensation of LUE   Shoulder Instructions      Home Living Family/patient expects to be discharged to:: Private residence Living Arrangements: Spouse/significant other Available Help at Discharge: Family;Available 24 hours/day Type of Home: House Home Access: Stairs to enter [employer] of Steps: 1   Home Layout: One level     Bathroom Shower/Tub: Occupational psychologist: Standard(BSC)     Home Equipment: Bedside commode;Grab bars - tub/shower;Grab bars - toilet;Walker - 2 wheels;Cane - single point;Crutches          Prior Functioning/Environment Level of Independence: Independent        Comments: ADLs, IADLs, driving. using RW, rollator, and cane for mobility        OT Problem List: Decreased strength;Decreased range of motion;Decreased activity tolerance;Impaired balance (sitting and/or standing);Impaired vision/perception;Decreased coordination;Decreased cognition;Decreased safety awareness;Decreased knowledge of use of DME or AE;Impaired UE functional use;Pain      OT Treatment/Interventions: Self-care/ADL training;Therapeutic exercise;Energy conservation;DME and/or AE instruction;Therapeutic  activities;Patient/family education;Visual/perceptual remediation/compensation;Cognitive remediation/compensation    OT Goals(Current goals can be found in the care plan  section) Acute Rehab OT Goals Patient Stated Goal: be able to stand OT Goal Formulation: With patient Time For Goal Achievement: 06/11/17 Potential to Achieve Goals: Good ADL Goals Pt Will Perform Grooming: with min assist;sitting Pt Will Perform Upper Body Dressing: with mod assist;sitting Pt Will Transfer to Toilet: with mod assist;with +2 assist;stand pivot transfer;bedside commode Additional ADL Goal #1: Pt will perform bed mobility with Min A +2 in preparation for ADLs in sitting Additional ADL Goal #2: Pt will attend to objects on left side during ADLs 50% of time Additional ADL Goal #3: Pt will use LUE as a dependent stabilizer during ADLs  OT Frequency: Min 3X/week   Barriers to D/C:            Co-evaluation PT/OT/SLP Co-Evaluation/Treatment: Yes Reason for Co-Treatment: Complexity of the patient's impairments (multi-system involvement);To address functional/ADL transfers;For patient/therapist safety PT goals addressed during session: Mobility/safety with mobility;Balance OT goals addressed during session: ADL's and self-care      AM-PAC PT "6 Clicks" Daily Activity     Outcome Measure Help from another person eating meals?: A Lot Help from another person taking care of personal grooming?: A Lot Help from another person toileting, which includes using toliet, bedpan, or urinal?: Total Help from another person bathing (including washing, rinsing, drying)?: A Lot Help from another person to put on and taking off regular upper body clothing?: A Lot Help from another person to put on and taking off regular lower body clothing?: Total 6 Click Score: 10   End of Session Equipment Utilized During Treatment: Gait belt Nurse Communication: Mobility status;Precautions;Need for lift equipment  Activity Tolerance: Patient tolerated treatment well;Patient limited by fatigue Patient left: in bed;with call bell/phone within reach;with bed alarm set;with family/visitor present  OT  Visit Diagnosis: Unsteadiness on feet (R26.81);Other abnormalities of gait and mobility (R26.89);Muscle weakness (generalized) (M62.81);Other symptoms and signs involving cognitive function;Hemiplegia and hemiparesis Hemiplegia - Right/Left: Left Hemiplegia - dominant/non-dominant: Non-Dominant Hemiplegia - caused by: Cerebral infarction                Time: 0459-9774 OT Time Calculation (min): 49 min Charges:  OT General Charges $OT Visit: 1 Visit OT Evaluation $OT Eval Moderate Complexity: 1 Mod OT Treatments $Self Care/Home Management : 8-22 mins G-Codes:     Zorina Mallin MSOT, OTR/L Acute Rehab Pager: [phone] Office: [place] 05/28/2017, 3:21 PM

## 2017-05-28 NOTE — Progress Notes (Signed)
Pt under the care of anesthesia, unable to assess NIH

## 2017-05-28 NOTE — Progress Notes (Signed)
[place] [facility]  CONSULT SERVICE NOTE  Pt down in MRI. Team will follow up in AM  Meredith Staggers, MD, [facility] 05/28/2017

## 2017-05-28 NOTE — Progress Notes (Signed)
Referring Physician(s): Dr Lavera Guise  Supervising Physician: Luanne Bras  Patient Status:  [facility] - In-pt  Chief Complaint:  CVA R MCA revascularization  Subjective:  2/11: Findings.  1.Occluded RT MCA M 1 seg S/P complete revascularization with x 3 passes with solitaisre FR 37mm x 40 mm and x 1 pass with theTrevoprovue 73mm x 30 mm retriever devices achieving a TICI 2 b  revascularization  Pt is able to talk to me this am Knows name/dob Able to answer questions appropriately Moving Rt side well Left foot minimally-- nothing on leg/arm  Allergies: Patient has no known allergies.  Medications: Prior to Admission medications   Medication Sig Start Date End Date Taking? Authorizing Provider  atenolol (TENORMIN) 50 MG tablet Take 50 mg by mouth daily.   Yes [provider]  Dextran 70-Hypromellose, PF, (ARTIFICIAL TEARS PF) 0.1-0.3 % SOLN Place 1 drop into both eyes every 6 (six) hours as needed (for dry eyes).   Yes [provider]  furosemide (LASIX) 20 MG tablet Take 20 mg by mouth daily as needed for fluid.   Yes [provider]  latanoprost (XALATAN) 0.005 % ophthalmic solution Place 1 drop into both eyes at bedtime.   Yes [provider]  levothyroxine (SYNTHROID, LEVOTHROID) 112 MCG tablet Take 112 mcg by mouth daily before breakfast.   Yes [provider]  NIFEdipine (ADALAT CC) 90 MG 24 hr tablet Take 90 mg by mouth daily.   Yes [provider]  pregabalin (LYRICA) 100 MG capsule Take 100 mg by mouth 2 (two) times daily.   Yes [provider]  simvastatin (ZOCOR) 40 MG tablet Take 40 mg by mouth at bedtime.   Yes [provider]  timolol (TIMOPTIC-XE) 0.5 % ophthalmic gel-forming Place 1 drop into the left eye every morning.   Yes [provider]     Vital Signs: BP (!) 116/58   Pulse 60   Temp 98.7 F (37.1 C)   Resp 19   SpO2 100%   Physical Exam  Abdominal: Soft.    Musculoskeletal:  Right side moves to command Good strength  Left side no movement at arm/hand; minimal left foot; no movement left leg    Neurological: He is alert.  Speaks to me Answers questions appropriately Calls me by name many times   Skin: Skin is warm.  Rt groin tender---3x3 cm hematoma at site Additional small hematoma lateral and inferior to site  Weak pulse Rt foot Left foot 2+ pulse  I held pressure for few minutes---RN holding pressure at groin site now  Nursing note and vitals reviewed.   Imaging: Ct Angio Head W Or Wo Contrast  Result Date: 05/27/2017 CLINICAL DATA:  Code stroke.  Left-sided deficits. EXAM: CT ANGIOGRAPHY HEAD AND NECK CT PERFUSION BRAIN TECHNIQUE: Multidetector CT imaging of the head and neck was performed using the standard protocol during bolus administration of intravenous contrast. Multiplanar CT image reconstructions and MIPs were obtained to evaluate the vascular anatomy. Carotid stenosis measurements (when applicable) are obtained utilizing NASCET criteria, using the distal internal carotid diameter as the denominator. Multiphase CT imaging of the brain was performed following IV bolus contrast injection. Subsequent parametric perfusion maps were calculated using RAPID software. CONTRAST:  90 mL Isovue 370 COMPARISON:  Head CT 05/27/2017 FINDINGS: CTA NECK FINDINGS Aortic arch: There is moderate calcific atherosclerosis of the aortic arch. There is no aneurysm, dissection or hemodynamically significant stenosis of the visualized ascending aorta and aortic arch. Conventional  3 vessel aortic branching pattern. The visualized proximal subclavian arteries are normal. Right carotid system: The right common carotid origin is widely patent. There is no common carotid or internal carotid artery dissection or aneurysm. Atherosclerotic calcification at the carotid bifurcation without hemodynamically significant stenosis. Left carotid system: The left common  carotid origin is widely patent. There is no common carotid or internal carotid artery dissection or aneurysm. Atherosclerotic calcification at the carotid bifurcation without hemodynamically significant stenosis. Vertebral arteries: The vertebral system is left dominant. Both vertebral artery origins are normal. Both vertebral arteries are normal to their confluence with the basilar artery. Skeleton: Multilevel cervical facet arthrosis. No bony spinal canal stenosis. No skull lesion. Other neck: The nasopharynx is clear. The oropharynx and hypopharynx are normal. The epiglottis is normal. The supraglottic larynx, glottis and subglottic larynx are normal. No retropharyngeal collection. The parapharyngeal spaces are preserved. The parotid and submandibular glands are normal. No sialolithiasis or salivary ductal dilatation. The thyroid gland is normal. There is no cervical lymphadenopathy. Upper chest: Incompletely visualized right pleural effusion. Review of the MIP images confirms the above findings CTA HEAD FINDINGS Anterior circulation: --Intracranial internal carotid arteries: Normal. --Anterior cerebral arteries: The right middle cerebral artery M 1 segment is occluded at its distal aspect. There is very poor collateralization in the right MCA territory. The left middle cerebral artery is normal. --Middle cerebral arteries: Normal. --Posterior communicating arteries: Absent bilaterally. Posterior circulation: --Posterior cerebral arteries: Normal. --Superior cerebellar arteries: Normal. --Basilar artery: Normal. --Anterior inferior cerebellar arteries: Normal. --Posterior inferior cerebellar arteries: Normal. Venous sinuses: As permitted by contrast timing, patent. Anatomic variants: None Delayed phase: Not performed. 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 Review of the MIP images confirms the above findings. CT Brain Perfusion Findings: CBF (<30%) Volume: 36mL Perfusion (Tmax>6.0s) volume: 133mL  Mismatch Volume: 159mL Infarction Location:Right MCA territory IMPRESSION: 1. Emergent large vessel occlusion of the distal M1 segment of the right middle cerebral artery. Very poor collateralization throughout the right MCA territory. 2. Right MCA territory infarct of 52 mL with 108 mL ischemic penumbra. 3. No midline shift or other mass effect.  No acute hemorrhage. 4. Hila convexity meningioma. 5. Carotid and aortic atherosclerosis (ICD10-I70.0) without hemodynamically significant stenosis of the carotid or vertebral arteries. These results were communicated to Dr. Kerney Elbe at 6:27 pm on 05/27/2017 by text page via the [facility] messaging system. I am currently awaiting confirmatory telephone call. Electronically Signed   By: Ulyses Jarred M.D.   On: 05/27/2017 18:33   Ct Angio Neck W Or Wo Contrast  Result Date: 05/27/2017 CLINICAL DATA:  Code stroke.  Left-sided deficits. EXAM: CT ANGIOGRAPHY HEAD AND NECK CT PERFUSION BRAIN TECHNIQUE: Multidetector CT imaging of the head and neck was performed using the standard protocol during bolus administration of intravenous contrast. Multiplanar CT image reconstructions and MIPs were obtained to evaluate the vascular anatomy. Carotid stenosis measurements (when applicable) are obtained utilizing NASCET criteria, using the distal internal carotid diameter as the denominator. Multiphase CT imaging of the brain was performed following IV bolus contrast injection. Subsequent parametric perfusion maps were calculated using RAPID software. CONTRAST:  90 mL Isovue 370 COMPARISON:  Head CT 05/27/2017 FINDINGS: CTA NECK FINDINGS Aortic arch: There is moderate calcific atherosclerosis of the aortic arch. There is no aneurysm, dissection or hemodynamically significant stenosis of the visualized ascending aorta and aortic arch. Conventional 3 vessel aortic branching pattern. The visualized proximal subclavian arteries are normal. Right carotid system: The right common carotid  origin is widely patent. There is no common carotid or internal carotid artery dissection or aneurysm. Atherosclerotic calcification at the carotid bifurcation without hemodynamically significant stenosis. Left carotid system: The left common carotid origin is widely patent. There is no common carotid or internal carotid artery dissection or aneurysm. Atherosclerotic calcification at the carotid bifurcation without hemodynamically significant stenosis. Vertebral arteries: The vertebral system is left dominant. Both vertebral artery origins are normal. Both vertebral arteries are normal to their confluence with the basilar artery. Skeleton: Multilevel cervical facet arthrosis. No bony spinal canal stenosis. No skull lesion. Other neck: The nasopharynx is clear. The oropharynx and hypopharynx are normal. The epiglottis is normal. The supraglottic larynx, glottis and subglottic larynx are normal. No retropharyngeal collection. The parapharyngeal spaces are preserved. The parotid and submandibular glands are normal. No sialolithiasis or salivary ductal dilatation. The thyroid gland is normal. There is no cervical lymphadenopathy. Upper chest: Incompletely visualized right pleural effusion. Review of the MIP images confirms the above findings CTA HEAD FINDINGS Anterior circulation: --Intracranial internal carotid arteries: Normal. --Anterior cerebral arteries: The right middle cerebral artery M 1 segment is occluded at its distal aspect. There is very poor collateralization in the right MCA territory. The left middle cerebral artery is normal. --Middle cerebral arteries: Normal. --Posterior communicating arteries: Absent bilaterally. Posterior circulation: --Posterior cerebral arteries: Normal. --Superior cerebellar arteries: Normal. --Basilar artery: Normal. --Anterior inferior cerebellar arteries: Normal. --Posterior inferior cerebellar arteries: Normal. Venous sinuses: As permitted by contrast timing, patent. Anatomic  variants: None Delayed phase: Not performed. 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 Review of the MIP images confirms the above findings. CT Brain Perfusion Findings: CBF (<30%) Volume: 44mL Perfusion (Tmax>6.0s) volume: 146mL Mismatch Volume: 169mL Infarction Location:Right MCA territory IMPRESSION: 1. Emergent large vessel occlusion of the distal M1 segment of the right middle cerebral artery. Very poor collateralization throughout the right MCA territory. 2. Right MCA territory infarct of 52 mL with 108 mL ischemic penumbra. 3. No midline shift or other mass effect.  No acute hemorrhage. 4. Hila convexity meningioma. 5. Carotid and aortic atherosclerosis (ICD10-I70.0) without hemodynamically significant stenosis of the carotid or vertebral arteries. These results were communicated to Dr. Kerney Elbe at 6:27 pm on 05/27/2017 by text page via the [facility] messaging system. I am currently awaiting confirmatory telephone call. Electronically Signed   By: Ulyses Jarred M.D.   On: 05/27/2017 18:33   Ct Cerebral Perfusion W Contrast  Result Date: 05/27/2017 CLINICAL DATA:  Code stroke.  Left-sided deficits. EXAM: CT ANGIOGRAPHY HEAD AND NECK CT PERFUSION BRAIN TECHNIQUE: Multidetector CT imaging of the head and neck was performed using the standard protocol during bolus administration of intravenous contrast. Multiplanar CT image reconstructions and MIPs were obtained to evaluate the vascular anatomy. Carotid stenosis measurements (when applicable) are obtained utilizing NASCET criteria, using the distal internal carotid diameter as the denominator. Multiphase CT imaging of the brain was performed following IV bolus contrast injection. Subsequent parametric perfusion maps were calculated using RAPID software. CONTRAST:  90 mL Isovue 370 COMPARISON:  Head CT 05/27/2017 FINDINGS: CTA NECK FINDINGS Aortic arch: There is moderate calcific atherosclerosis of the aortic arch. There is no aneurysm, dissection or  hemodynamically significant stenosis of the visualized ascending aorta and aortic arch. Conventional 3 vessel aortic branching pattern. The visualized proximal subclavian arteries are normal. Right carotid system: The right common carotid origin is widely patent. There is no common carotid or internal carotid artery dissection or aneurysm. Atherosclerotic calcification at the carotid bifurcation  without hemodynamically significant stenosis. Left carotid system: The left common carotid origin is widely patent. There is no common carotid or internal carotid artery dissection or aneurysm. Atherosclerotic calcification at the carotid bifurcation without hemodynamically significant stenosis. Vertebral arteries: The vertebral system is left dominant. Both vertebral artery origins are normal. Both vertebral arteries are normal to their confluence with the basilar artery. Skeleton: Multilevel cervical facet arthrosis. No bony spinal canal stenosis. No skull lesion. Other neck: The nasopharynx is clear. The oropharynx and hypopharynx are normal. The epiglottis is normal. The supraglottic larynx, glottis and subglottic larynx are normal. No retropharyngeal collection. The parapharyngeal spaces are preserved. The parotid and submandibular glands are normal. No sialolithiasis or salivary ductal dilatation. The thyroid gland is normal. There is no cervical lymphadenopathy. Upper chest: Incompletely visualized right pleural effusion. Review of the MIP images confirms the above findings CTA HEAD FINDINGS Anterior circulation: --Intracranial internal carotid arteries: Normal. --Anterior cerebral arteries: The right middle cerebral artery M 1 segment is occluded at its distal aspect. There is very poor collateralization in the right MCA territory. The left middle cerebral artery is normal. --Middle cerebral arteries: Normal. --Posterior communicating arteries: Absent bilaterally. Posterior circulation: --Posterior cerebral arteries:  Normal. --Superior cerebellar arteries: Normal. --Basilar artery: Normal. --Anterior inferior cerebellar arteries: Normal. --Posterior inferior cerebellar arteries: Normal. Venous sinuses: As permitted by contrast timing, patent. Anatomic variants: None Delayed phase: Not performed. 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 Review of the MIP images confirms the above findings. CT Brain Perfusion Findings: CBF (<30%) Volume: 28mL Perfusion (Tmax>6.0s) volume: 135mL Mismatch Volume: 131mL Infarction Location:Right MCA territory IMPRESSION: 1. Emergent large vessel occlusion of the distal M1 segment of the right middle cerebral artery. Very poor collateralization throughout the right MCA territory. 2. Right MCA territory infarct of 52 mL with 108 mL ischemic penumbra. 3. No midline shift or other mass effect.  No acute hemorrhage. 4. Hila convexity meningioma. 5. Carotid and aortic atherosclerosis (ICD10-I70.0) without hemodynamically significant stenosis of the carotid or vertebral arteries. These results were communicated to Dr. Kerney Elbe at 6:27 pm on 05/27/2017 by text page via the [facility] messaging system. I am currently awaiting confirmatory telephone call. Electronically Signed   By: Ulyses Jarred M.D.   On: 05/27/2017 18:33   Ct Head Code Stroke Wo Contrast  Result Date: 05/27/2017 CLINICAL DATA:  Code stroke. 84 year old male with left side neurologic deficits. Last seen normal 1630 hrs. EXAM: CT HEAD WITHOUT CONTRAST TECHNIQUE: Contiguous axial images were obtained from the base of the skull through the vertex without intravenous contrast. COMPARISON:  No prior head CT. FINDINGS: Brain: Confluent hypodensity in the right inferior frontal gyrus near the operculum appears mostly confined to the white matter. No other right MCA territory cytotoxic edema is identified. There is a cytotoxic edema in the medial and posterior left occipital lobe with no regional mass effect. Other scattered white matter  hypodensity, but no other cytotoxic edema identified. No acute intracranial hemorrhage identified. No ventriculomegaly. No intracranial mass effect. There does appear to be a small area of chronic cortical encephalomalacia in the left posterior frontal lobe (sagittal image 49). Vascular: Hyperdense right MCA and right MCA bifurcation best demonstrated on coronal image 29 and sagittal image 19. Dominant appearing distal left vertebral artery. Skull: No acute osseous abnormality identified. Sinuses/Orbits: Clear. Other: No acute findings. ASPECTS [facility] Stroke Program Early CT Score) - Ganglionic level infarction (caudate, lentiform nuclei, internal capsule, insula, M1-M3 cortex): 6 (abnormal right M1 segment) - Supraganglionic  infarction (M4-M6 cortex): 3 Total score (0-10 with 10 being normal): 9 IMPRESSION: 1. Positive for hyperdense right MCA. Hypodensity in the right frontal lobe appears mostly confined to the white matter. ASPECTS is 9. No hemorrhage or mass effect. 2. Subacute appearing left PCA territory infarct with no hemorrhage or mass effect. 3. These results were communicated to Dr. Cheral Marker at 5:53 pmon 2/11/2019by text page via the [facility] messaging system. Electronically Signed   By: Genevie Ann M.D.   On: 05/27/2017 17:54    Labs:  CBC: Recent Labs    05/27/17 1730 05/27/17 1739 05/28/17 0611  WBC 7.5  --  9.2  HGB 14.7 14.6 12.6*  HCT 42.4 43.0 36.8*  PLT 218  --  216    COAGS: Recent Labs    05/27/17 1730  INR 1.01  APTT 30    BMP: Recent Labs    05/27/17 1730 05/27/17 1739 05/28/17 0611  NA 139 140 139  K 4.2 4.1 3.8  CL 105 105 110  CO2 24  --  17*  GLUCOSE 128* 128* 187*  BUN 26* 27* 24*  CALCIUM 9.3  --  8.6*  CREATININE 1.46* 1.40* 1.35*  GFRNONAA 42*  --  47*  GFRAA 49*  --  54*    LIVER FUNCTION TESTS: Recent Labs    05/27/17 1730  BILITOT 1.1  AST 22  ALT 10*  ALKPHOS 76  PROT 6.9  ALBUMIN 3.9    Assessment and Plan:  CVA R MCA  revascularization in NIR Rt groin hematoma [place] ordered to evaluate hematoma/psuedoaneurysm Will follow    Electronically Signed: Kynlie Jane A, PA-C 05/28/2017, 8:11 AM   I spent a total of 15 Minutes at the the patient's bedside AND on the patient's hospital floor or unit, greater than 50% of which was counseling/coordinating care for CVA/R MCA revasc

## 2017-05-28 NOTE — Progress Notes (Signed)
D/c restraint order d/t pt not longer needing to stay flat and pt is calm and cooporative

## 2017-05-28 NOTE — Progress Notes (Signed)
SLP Cancellation Note  Patient Details Name: Derek Hays MRN: [id number] DOB: 13-Aug-1930   Cancelled treatment:       Reason Eval/Treat Not Completed: Patient at procedure or test/unavailable. Pt currently at MRI. Will continue efforts tomorrow.    Houston Siren 05/28/2017, 4:49 PM    Orbie Pyo Colvin Caroli.[employer] [phone]

## 2017-05-28 NOTE — Progress Notes (Signed)
Pt developed hematoma at R groin site.  Placed pt back to flat position and held pressure for 25 minutes.  Groin site feels softer and pulse is present via doppler.  Will continue to monitor pt.  Jannifer Franklin, PA aware.  She ordered a vascular US of his groin site.

## 2017-05-28 NOTE — Progress Notes (Signed)
Right groin limited arterial duplex: no evidence of pseudoaneurysm or hematoma. Landry Mellow, RDMS, RVT

## 2017-05-28 NOTE — Consult Note (Signed)
Physical Medicine and Rehabilitation Consult Reason for Consult: Left-sided weakness Referring Physician: Dr.Xu   HPI: Derek Hays is a 84 y.o. right handed male with history of hypertension, atrial fibrillation/pacemaker 2016 on Eliquis in the past but discontinued by cardiology for concern of bleeding risk after patient with recent fall followed by Dr Bridgette Habermann [facility]. Per chart review patient lives with spouse. Independent using a rolling walker/cane and still driving. One level home with one step to entry. Presented 05/27/2017 with acute onset of left-sided weakness as well as slurred speech. CT of the head positive for hyperdense right MCA. No hemorrhage or mass effect. Subacute appearing left PCA territory infarct. CT cerebral perfusion scan emergent large vessel occlusion of the distal M1 segment of the right middle cerebral artery. Underwent revascularization per interventional radiology. Echocardiogram with ejection fraction 86% grade 2 diastolic dysfunction. MRI/MRA 05/28/2017 acute right MCA infarct involving the right insula and frontal parietal lobe. Small acute or subacute infarct left parietal lobe.[name] MRA status post revascularization of right middle cerebral artery occlusion. There remain moderate to severe stenosis proximal right M2 segment which felt to be due to underlying atherosclerotic disease. Presently on aspirin for CVA prophylaxis. Physical and occupational therapy evaluations completed 05/28/2017 with recommendations of physical medicine rehabilitation consult.  Seen by speech therapy initially made n.p.o. but after modified upgraded to D1 nectar Warning to wife patient used a walker at times.  Wife also uses a walker mainly when she goes out  Review of Systems  Constitutional: Negative for chills and fever.  HENT: Negative for hearing loss.   Eyes: Negative for blurred vision and double vision.  Respiratory: Negative for cough and shortness of breath.     Cardiovascular: Positive for palpitations. Negative for chest pain and leg swelling.  Gastrointestinal: Positive for constipation. Negative for nausea and vomiting.  Genitourinary: Positive for urgency. Negative for dysuria, flank pain and hematuria.  Musculoskeletal: Positive for joint pain and myalgias.  Skin: Negative for rash.  Neurological: Positive for sensory change, speech change and focal weakness.  All other systems reviewed and are negative.  Past Medical History:  Diagnosis Date  . Atrial fibrillation ([place])   . Hypertension    Past Surgical History:  Procedure Laterality Date  . RADIOLOGY WITH ANESTHESIA N/A 05/27/2017   Procedure: RADIOLOGY WITH ANESTHESIA;  Surgeon: Luanne Bras, MD;  Location: [place];  Service: Radiology;  Laterality: N/A;   History reviewed. No pertinent family history. Social History:  reports that  has never smoked. he has never used smokeless tobacco. He reports that he drinks alcohol. He reports that he does not use drugs. Allergies: No Known Allergies Medications Prior to Admission  Medication Sig Dispense Refill  . atenolol (TENORMIN) 50 MG tablet Take 50 mg by mouth daily.    [name] Dextran 70-Hypromellose, PF, (ARTIFICIAL TEARS PF) 0.1-0.3 % SOLN Place 1 drop into both eyes every 6 (six) hours as needed (for dry eyes).    . furosemide (LASIX) 20 MG tablet Take 20 mg by mouth daily as needed for fluid.    [name] latanoprost (XALATAN) 0.005 % ophthalmic solution Place 1 drop into both eyes at bedtime.    [name] levothyroxine (SYNTHROID, LEVOTHROID) 112 MCG tablet Take 112 mcg by mouth daily before breakfast.    . NIFEdipine (ADALAT CC) 90 MG 24 hr tablet Take 90 mg by mouth daily.    . pregabalin (LYRICA) 100 MG capsule Take 100 mg by mouth 2 (two) times daily.    [name]  simvastatin (ZOCOR) 40 MG tablet Take 40 mg by mouth at bedtime.    . timolol (TIMOPTIC-XE) 0.5 % ophthalmic gel-forming Place 1 drop into the left eye every morning.      Home: Home  Living Family/patient expects to be discharged to:: Private residence Living Arrangements: Spouse/significant other Available Help at Discharge: Family, Available 24 hours/day Type of Home: House Home Access: Stairs to enter Technical brewer of Steps: 1 Home Layout: One level Bathroom Shower/Tub: Multimedia programmer: Standard(BSC) Home Equipment: Bedside commode, Grab bars - tub/shower, Grab bars - toilet, Environmental consultant - 2 wheels, Cane - single point, Crutches  Functional History: Prior Function Level of Independence: Independent Comments: ADLs, iADLs, driving. using RW, rollator, and cane for mobility Functional Status:  Mobility: Bed Mobility Overal bed mobility: Needs Assistance Bed Mobility: Supine to Sit, Sit to Supine Supine to sit: Max assist, +2 for physical assistance, HOB elevated Sit to supine: Max assist, +2 for physical assistance General bed mobility comments: assist to pivot legs to EOB and elevate trunk with HOB 35 degrees. With return to supine assist to bend onto right forearm with assist to bring legs onto surface, total assist to slide up in bed Transfers Overall transfer level: Needs assistance Transfers: Sit to/from Stand Sit to Stand: +2 physical assistance, Max assist General transfer comment: bil knees blocked, cues for anterior right translation with assist to rise, pt unable to achieve fully upright. x 2 trials with belt and pad with attempt to pivot hips toward right pt resitant despite 2 person assist and unable to budge hips. Returned to sitting for lift to chair however after pad placed lift would not function and pt returned to supine Ambulation/Gait General Gait Details: unable    ADL: ADL Overall ADL's : Needs assistance/impaired Grooming: Wash/dry face, Maximal assistance, Sitting Grooming Details (indicate cue type and reason): Pt able to bring wash cloth to face with Max A for sitting at EOB Upper Body Bathing: Maximal assistance,  Bed level Lower Body Bathing: Total assistance, Bed level Upper Body Dressing : Maximal assistance, Bed level Lower Body Dressing: Total assistance, Bed level Toileting- Clothing Manipulation and Hygiene: Total assistance, Bed level General ADL Comments: Pt presenting with no AROM of LUE, poor cognition, inattention to left side (body and environment), and decreased balance. Pt maintaining sitting at EOB with Max A; moments with Min A. Pt pushing towards L side during sitting and standing. Very poor attention to left side, able to track with Max verbal and visual cues.   Cognition: Cognition Overall Cognitive Status: Impaired/Different from baseline Orientation Level: Oriented X4 Cognition Arousal/Alertness: Awake/alert Behavior During Therapy: Flat affect Overall Cognitive Status: Impaired/Different from baseline Area of Impairment: Attention, Memory, Following commands, Safety/judgement, Problem solving Current Attention Level: Focused Memory: Decreased short-term memory Following Commands: Follows one step commands inconsistently, Follows one step commands with increased time Safety/Judgement: Decreased awareness of safety, Decreased awareness of deficits Problem Solving: Slow processing, Decreased initiation, Difficulty sequencing, Requires verbal cues, Requires tactile cues General Comments: left neglect,  pinching his cheek on the left stating it is a washcloth, holding his LUE with RUE stated it is this therapists arm, pt perseverating on kids and wifes location in room  Blood pressure 115/65, pulse (!) 59, temperature 98.7 F (37.1 C), resp. rate (!) 23, SpO2 99 %. Physical Exam  Vitals reviewed. HENT:  Head: Normocephalic.  Eyes: EOM are normal.  Neck: Normal range of motion. Neck supple. No thyromegaly present.  Cardiovascular: Normal rate and regular rhythm.  Respiratory: Effort normal and breath sounds normal. No respiratory distress.  GI: Soft. Bowel sounds are normal.  He exhibits no distension.  Neurological:  Patient is a bit lethargic but arousable. He does have some decreased insight into his deficits. Right gaze preference. Followed simple commands. Speech is dysarthric but intelligible.  Skin: Skin is warm and dry.  Motor strength is 0/5 in the left deltoid bicep tricep grip 2- at the left hip knee extensor synergy otherwise 0 Right upper extremity is 5/5 in the deltoid bicep tricep grip right lower extremities 4/5 in the right hip flexor knee extensor ankle dorsiflexor Sensation is absent to pinch as well as light touch in the left upper and left lower limb   Results for orders placed or performed during the hospital encounter of 05/27/17 (from the past 24 hour(s))  Protime-INR     Status: None   Collection Time: 05/27/17  5:30 PM  Result Value Ref Range   Prothrombin Time 13.2 11.4 - 15.2 seconds   INR 1.01   APTT     Status: None   Collection Time: 05/27/17  5:30 PM  Result Value Ref Range   aPTT 30 24 - 36 seconds  CBC     Status: None   Collection Time: 05/27/17  5:30 PM  Result Value Ref Range   WBC 7.5 4.0 - 10.5 K/uL   RBC 4.74 4.22 - 5.81 MIL/uL   Hemoglobin 14.7 13.0 - 17.0 g/dL   HCT 42.4 39.0 - 52.0 %   MCV 89.5 78.0 - 100.0 fL   MCH 31.0 26.0 - 34.0 pg   MCHC 34.7 30.0 - 36.0 g/dL   RDW 13.5 11.5 - 15.5 %   Platelets 218 150 - 400 K/uL  Differential     Status: None   Collection Time: 05/27/17  5:30 PM  Result Value Ref Range   Neutrophils Relative % 64 %   Neutro Abs 4.8 1.7 - 7.7 K/uL   Lymphocytes Relative 23 %   Lymphs Abs 1.7 0.7 - 4.0 K/uL   Monocytes Relative 10 %   Monocytes Absolute 0.7 0.1 - 1.0 K/uL   Eosinophils Relative 2 %   Eosinophils Absolute 0.2 0.0 - 0.7 K/uL   Basophils Relative 1 %   Basophils Absolute 0.1 0.0 - 0.1 K/uL  Comprehensive metabolic panel     Status: Abnormal   Collection Time: 05/27/17  5:30 PM  Result Value Ref Range   Sodium 139 135 - 145 mmol/L   Potassium 4.2 3.5 - 5.1 mmol/L    Chloride 105 101 - 111 mmol/L   CO2 24 22 - 32 mmol/L   Glucose, Bld 128 (H) 65 - 99 mg/dL   BUN 26 (H) 6 - 20 mg/dL   Creatinine, Ser 1.46 (H) 0.61 - 1.24 mg/dL   Calcium 9.3 8.9 - 10.3 mg/dL   Total Protein 6.9 6.5 - 8.1 g/dL   Albumin 3.9 3.5 - 5.0 g/dL   AST 22 15 - 41 U/L   ALT 10 (L) 17 - 63 U/L   Alkaline Phosphatase 76 38 - 126 U/L   Total Bilirubin 1.1 0.3 - 1.2 mg/dL   GFR calc non Af Amer 42 (L) >60 mL/min   GFR calc Af Amer 49 (L) >60 mL/min   Anion gap 10 5 - 15  CBG monitoring, ED     Status: Abnormal   Collection Time: 05/27/17  5:32 PM  Result Value Ref Range   Glucose-Capillary 128 (H) 65 -  99 mg/dL  I-stat troponin, ED     Status: None   Collection Time: 05/27/17  5:38 PM  Result Value Ref Range   Troponin i, poc 0.00 0.00 - 0.08 ng/mL   Comment 3          I-Stat Chem 8, ED     Status: Abnormal   Collection Time: 05/27/17  5:39 PM  Result Value Ref Range   Sodium 140 135 - 145 mmol/L   Potassium 4.1 3.5 - 5.1 mmol/L   Chloride 105 101 - 111 mmol/L   BUN 27 (H) 6 - 20 mg/dL   Creatinine, Ser 1.40 (H) 0.61 - 1.24 mg/dL   Glucose, Bld 128 (H) 65 - 99 mg/dL   Calcium, Ion 1.04 (L) 1.15 - 1.40 mmol/L   TCO2 23 22 - 32 mmol/L   Hemoglobin 14.6 13.0 - 17.0 g/dL   HCT 43.0 39.0 - 52.0 %  MRSA PCR Screening     Status: None   Collection Time: 05/28/17 12:11 AM  Result Value Ref Range   MRSA by PCR NEGATIVE NEGATIVE  Hemoglobin A1c     Status: None   Collection Time: 05/28/17  5:00 AM  Result Value Ref Range   Hgb A1c MFr Bld 5.4 4.8 - 5.6 %   Mean Plasma Glucose 108.28 mg/dL  CBC with Differential/Platelet     Status: Abnormal   Collection Time: 05/28/17  6:11 AM  Result Value Ref Range   WBC 9.2 4.0 - 10.5 K/uL   RBC 4.12 (L) 4.22 - 5.81 MIL/uL   Hemoglobin 12.6 (L) 13.0 - 17.0 g/dL   HCT 36.8 (L) 39.0 - 52.0 %   MCV 89.3 78.0 - 100.0 fL   MCH 30.6 26.0 - 34.0 pg   MCHC 34.2 30.0 - 36.0 g/dL   RDW 13.3 11.5 - 15.5 %   Platelets 216 150 - 400 K/uL    Neutrophils Relative % 91 %   Neutro Abs 8.4 (H) 1.7 - 7.7 K/uL   Lymphocytes Relative 7 %   Lymphs Abs 0.6 (L) 0.7 - 4.0 K/uL   Monocytes Relative 2 %   Monocytes Absolute 0.2 0.1 - 1.0 K/uL   Eosinophils Relative 0 %   Eosinophils Absolute 0.0 0.0 - 0.7 K/uL   Basophils Relative 0 %   Basophils Absolute 0.0 0.0 - 0.1 K/uL  Basic metabolic panel     Status: Abnormal   Collection Time: 05/28/17  6:11 AM  Result Value Ref Range   Sodium 139 135 - 145 mmol/L   Potassium 3.8 3.5 - 5.1 mmol/L   Chloride 110 101 - 111 mmol/L   CO2 17 (L) 22 - 32 mmol/L   Glucose, Bld 187 (H) 65 - 99 mg/dL   BUN 24 (H) 6 - 20 mg/dL   Creatinine, Ser 1.35 (H) 0.61 - 1.24 mg/dL   Calcium 8.6 (L) 8.9 - 10.3 mg/dL   GFR calc non Af Amer 47 (L) >60 mL/min   GFR calc Af Amer 54 (L) >60 mL/min   Anion gap 12 5 - 15  Lipid panel     Status: None   Collection Time: 05/28/17  6:11 AM  Result Value Ref Range   Cholesterol 133 0 - 200 mg/dL   Triglycerides 66 <150 mg/dL   HDL 43 >40 mg/dL   Total CHOL/HDL Ratio 3.1 RATIO   VLDL 13 0 - 40 mg/dL   LDL Cholesterol 77 0 - 99 mg/dL     Assessment/Plan: Diagnosis: 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 Does the need for close, 24 hr/day medical supervision in concert with the patient's rehab needs make it unreasonable for this patient to be served in a less intensive setting? Yes 2. Co-Morbidities requiring supervision/potential complications: Atrial fibrillation, hypertension 3. Due to bladder management, bowel management, safety, skin/wound care, disease management, medication administration, pain management and patient education, does the patient require 24 hr/day rehab nursing? Yes 4. Does the patient require coordinated care of a physician, rehab nurse, PT (1-2 hrs/day, 5 days/week), OT (1-2 hrs/day, 5 days/week) and SLP (.5-1 hrs/day, 5 days/week) to address physical and functional deficits in  the context of the above medical diagnosis(es)? Yes Addressing deficits in the following areas: balance, endurance, locomotion, strength, transferring, bowel/bladder control, bathing, dressing, feeding, grooming, toileting, cognition, speech, swallowing and psychosocial support 5. Can the patient actively participate in an intensive therapy program of at least 3 hrs of therapy per day at least 5 days per week? Yes 6. The potential for patient to make measurable gains while on inpatient rehab is fair 7. Anticipated functional outcomes upon discharge from inpatient rehab are min assist and mod assist  with PT, min assist and mod assist with OT, Safe and adequate p.o. fluid and caloric intake with SLP. 8. Estimated rehab length of stay to reach the above functional goals is: 19-22 days 9. Anticipated D/C setting: Home versus SNF 10. Anticipated post D/C treatments: [facility] therapy 11. Overall Rehab/Functional Prognosis: fair  RECOMMENDATIONS: This patient's condition is appropriate for continued rehabilitative care in the following setting: CIR Patient has agreed to participate in recommended program. Yes Note that insurance prior authorization may be required for reimbursement for recommended care.  Comment: May need SNF post CIR  Charlett Blake M.D. [facility] FAAPM&R (Sports Med, Neuromuscular Med) Diplomate Am Board of Electrodiagnostic Med  Elizabeth Sauer 05/28/2017

## 2017-05-28 NOTE — Progress Notes (Addendum)
STROKE TEAM PROGRESS NOTE   SUBJECTIVE (INTERVAL HISTORY) His daughter and wife are at the bedside.  Overall he feels his condition is gradually improving. Pt had right groin hematoma overnight, now kept flat in bed, hematoma getting softer, will do vascular ultrasound. Still has right gaze and left hemiplegia. Need pacer clearance for MRI and MRA.    OBJECTIVE Temp:  [97.4 F (36.3 C)-98.7 F (37.1 C)] 98.5 F (36.9 C) (02/12 2000) Pulse Rate:  [46-118] 59 (02/12 2000) Cardiac Rhythm: Ventricular paced (02/12 2000) Resp:  [12-28] 20 (02/12 2000) BP: (94-179)/(53-148) 126/73 (02/12 2000) SpO2:  [87 %-100 %] 97 % (02/12 2000) Arterial Line BP: (111-169)/(46-78) 141/66 (02/12 1500)  Recent Labs  Lab 05/27/17 1732  GLUCAP 128*   Recent Labs  Lab 05/27/17 1730 05/27/17 1739 05/28/17 0611  NA 139 140 139  K 4.2 4.1 3.8  CL 105 105 110  CO2 24  --  17*  GLUCOSE 128* 128* 187*  BUN 26* 27* 24*  CREATININE 1.46* 1.40* 1.35*  CALCIUM 9.3  --  8.6*   Recent Labs  Lab 05/27/17 1730  AST 22  ALT 10*  ALKPHOS 76  BILITOT 1.1  PROT 6.9  ALBUMIN 3.9   Recent Labs  Lab 05/27/17 1730 05/27/17 1739 05/28/17 0611  WBC 7.5  --  9.2  NEUTROABS 4.8  --  8.4*  HGB 14.7 14.6 12.6*  HCT 42.4 43.0 36.8*  MCV 89.5  --  89.3  PLT 218  --  216   No results for input(s): CKTOTAL, CKMB, CKMBINDEX, TROPONINI in the last 168 hours. Recent Labs    05/27/17 1730  LABPROT 13.2  INR 1.01   No results for input(s): COLORURINE, LABSPEC, PHURINE, GLUCOSEU, HGBUR, BILIRUBINUR, KETONESUR, PROTEINUR, UROBILINOGEN, NITRITE, LEUKOCYTESUR in the last 72 hours.  Invalid input(s): APPERANCEUR     Component Value Date/Time   CHOL 133 05/28/2017 0611   TRIG 66 05/28/2017 0611   HDL 43 05/28/2017 0611   CHOLHDL 3.1 05/28/2017 0611   VLDL 13 05/28/2017 0611   LDLCALC 77 05/28/2017 0611   Lab Results  Component Value Date   HGBA1C 5.4 05/28/2017   No results found for: LABOPIA,  COCAINSCRNUR, LABBENZ, AMPHETMU, THCU, LABBARB  No results for input(s): ETH in the last 168 hours.  I have personally reviewed the radiological images below and agree with the radiology interpretations.  Ct Angio Head W Or Wo Contrast  Result Date: 05/27/2017 CLINICAL DATA:  Code stroke.  Left-sided deficits. EXAM: CT ANGIOGRAPHY HEAD AND NECK CT PERFUSION BRAIN TECHNIQUE: Multidetector CT imaging of the head and neck was performed using the standard protocol during bolus administration of intravenous contrast. Multiplanar CT image reconstructions and MIPs were obtained to evaluate the vascular anatomy. Carotid stenosis measurements (when applicable) are obtained utilizing NASCET criteria, using the distal internal carotid diameter as the denominator. Multiphase CT imaging of the brain was performed following IV bolus contrast injection. Subsequent parametric perfusion maps were calculated using RAPID software. CONTRAST:  90 mL Isovue 370 COMPARISON:  Head CT 05/27/2017 FINDINGS: CTA NECK FINDINGS Aortic arch: There is moderate calcific atherosclerosis of the aortic arch. There is no aneurysm, dissection or hemodynamically significant stenosis of the visualized ascending aorta and aortic arch. Conventional 3 vessel aortic branching pattern. The visualized proximal subclavian arteries are normal. Right carotid system: The right common carotid origin is widely patent. There is no common carotid or internal carotid artery dissection or aneurysm. Atherosclerotic calcification at the carotid bifurcation without hemodynamically significant  stenosis. Left carotid system: The left common carotid origin is widely patent. There is no common carotid or internal carotid artery dissection or aneurysm. Atherosclerotic calcification at the carotid bifurcation without hemodynamically significant stenosis. Vertebral arteries: The vertebral system is left dominant. Both vertebral artery origins are normal. Both vertebral  arteries are normal to their confluence with the basilar artery. Skeleton: Multilevel cervical facet arthrosis. No bony spinal canal stenosis. No skull lesion. Other neck: The nasopharynx is clear. The oropharynx and hypopharynx are normal. The epiglottis is normal. The supraglottic larynx, glottis and subglottic larynx are normal. No retropharyngeal collection. The parapharyngeal spaces are preserved. The parotid and submandibular glands are normal. No sialolithiasis or salivary ductal dilatation. The thyroid gland is normal. There is no cervical lymphadenopathy. Upper chest: Incompletely visualized right pleural effusion. Review of the MIP images confirms the above findings CTA HEAD FINDINGS Anterior circulation: --Intracranial internal carotid arteries: Normal. --Anterior cerebral arteries: The right middle cerebral artery M 1 segment is occluded at its distal aspect. There is very poor collateralization in the right MCA territory. The left middle cerebral artery is normal. --Middle cerebral arteries: Normal. --Posterior communicating arteries: Absent bilaterally. Posterior circulation: --Posterior cerebral arteries: Normal. --Superior cerebellar arteries: Normal. --Basilar artery: Normal. --Anterior inferior cerebellar arteries: Normal. --Posterior inferior cerebellar arteries: Normal. Venous sinuses: As permitted by contrast timing, patent. Anatomic variants: None Delayed phase: Not performed. 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 Review of the MIP images confirms the above findings. CT Brain Perfusion Findings: CBF (<30%) Volume: 62mL Perfusion (Tmax>6.0s) volume: 160mL Mismatch Volume: 167mL Infarction Location:Right MCA territory IMPRESSION: 1. Emergent large vessel occlusion of the distal M1 segment of the right middle cerebral artery. Very poor collateralization throughout the right MCA territory. 2. Right MCA territory infarct of 52 mL with 108 mL ischemic penumbra. 3. No midline shift or other  mass effect.  No acute hemorrhage. 4. Hila convexity meningioma. 5. Carotid and aortic atherosclerosis (ICD10-I70.0) without hemodynamically significant stenosis of the carotid or vertebral arteries. These results were communicated to Dr. Kerney Elbe at 6:27 pm on 05/27/2017 by text page via the [facility] messaging system. I am currently awaiting confirmatory telephone call. Electronically Signed   By: Ulyses Jarred M.D.   On: 05/27/2017 18:33   Ct Angio Neck W Or Wo Contrast  Result Date: 05/27/2017 CLINICAL DATA:  Code stroke.  Left-sided deficits. EXAM: CT ANGIOGRAPHY HEAD AND NECK CT PERFUSION BRAIN TECHNIQUE: Multidetector CT imaging of the head and neck was performed using the standard protocol during bolus administration of intravenous contrast. Multiplanar CT image reconstructions and MIPs were obtained to evaluate the vascular anatomy. Carotid stenosis measurements (when applicable) are obtained utilizing NASCET criteria, using the distal internal carotid diameter as the denominator. Multiphase CT imaging of the brain was performed following IV bolus contrast injection. Subsequent parametric perfusion maps were calculated using RAPID software. CONTRAST:  90 mL Isovue 370 COMPARISON:  Head CT 05/27/2017 FINDINGS: CTA NECK FINDINGS Aortic arch: There is moderate calcific atherosclerosis of the aortic arch. There is no aneurysm, dissection or hemodynamically significant stenosis of the visualized ascending aorta and aortic arch. Conventional 3 vessel aortic branching pattern. The visualized proximal subclavian arteries are normal. Right carotid system: The right common carotid origin is widely patent. There is no common carotid or internal carotid artery dissection or aneurysm. Atherosclerotic calcification at the carotid bifurcation without hemodynamically significant stenosis. Left carotid system: The left common carotid origin is widely patent. There is no common carotid or internal carotid  artery  dissection or aneurysm. Atherosclerotic calcification at the carotid bifurcation without hemodynamically significant stenosis. Vertebral arteries: The vertebral system is left dominant. Both vertebral artery origins are normal. Both vertebral arteries are normal to their confluence with the basilar artery. Skeleton: Multilevel cervical facet arthrosis. No bony spinal canal stenosis. No skull lesion. Other neck: The nasopharynx is clear. The oropharynx and hypopharynx are normal. The epiglottis is normal. The supraglottic larynx, glottis and subglottic larynx are normal. No retropharyngeal collection. The parapharyngeal spaces are preserved. The parotid and submandibular glands are normal. No sialolithiasis or salivary ductal dilatation. The thyroid gland is normal. There is no cervical lymphadenopathy. Upper chest: Incompletely visualized right pleural effusion. Review of the MIP images confirms the above findings CTA HEAD FINDINGS Anterior circulation: --Intracranial internal carotid arteries: Normal. --Anterior cerebral arteries: The right middle cerebral artery M 1 segment is occluded at its distal aspect. There is very poor collateralization in the right MCA territory. The left middle cerebral artery is normal. --Middle cerebral arteries: Normal. --Posterior communicating arteries: Absent bilaterally. Posterior circulation: --Posterior cerebral arteries: Normal. --Superior cerebellar arteries: Normal. --Basilar artery: Normal. --Anterior inferior cerebellar arteries: Normal. --Posterior inferior cerebellar arteries: Normal. Venous sinuses: As permitted by contrast timing, patent. Anatomic variants: None Delayed phase: Not performed. 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 Review of the MIP images confirms the above findings. CT Brain Perfusion Findings: CBF (<30%) Volume: 39mL Perfusion (Tmax>6.0s) volume: 165mL Mismatch Volume: 171mL Infarction Location:Right MCA territory IMPRESSION: 1. Emergent large  vessel occlusion of the distal M1 segment of the right middle cerebral artery. Very poor collateralization throughout the right MCA territory. 2. Right MCA territory infarct of 52 mL with 108 mL ischemic penumbra. 3. No midline shift or other mass effect.  No acute hemorrhage. 4. Hila convexity meningioma. 5. Carotid and aortic atherosclerosis (ICD10-I70.0) without hemodynamically significant stenosis of the carotid or vertebral arteries. These results were communicated to Dr. Kerney Elbe at 6:27 pm on 05/27/2017 by text page via the [facility] messaging system. I am currently awaiting confirmatory telephone call. Electronically Signed   By: Ulyses Jarred M.D.   On: 05/27/2017 18:33   Mr Jodene Nam Head Wo Contrast  Result Date: 05/28/2017 CLINICAL DATA:  Stroke.  Endovascular clot retrieval 05/27/2017 EXAM: MRI HEAD WITHOUT CONTRAST MRA HEAD WITHOUT CONTRAST TECHNIQUE: Multiplanar, multiecho pulse sequences of the brain and surrounding structures were obtained without intravenous contrast. Angiographic images of the head were obtained using MRA technique without contrast. COMPARISON:  CTA 05/27/2017 FINDINGS: MRI HEAD FINDINGS Brain: Right MCA infarct with restricted diffusion in the right insula and right parietal lobe. Smaller area of acute infarct in the right frontal lobe. Small areas of acute infarct in the high right parietal lobe. No associated hemorrhage Restricted diffusion in the left parietal cortex compatible with a small area of acute infarct. Restricted diffusion in the left occipital lobe correlating to hypodensity on CT. This is compatible with subacute infarct. Negative for hemorrhage. 1 cm extra-axial mass left parietal convexity compatible with meningioma. No mass-effect Mild atrophy.  Negative for hydrocephalus. Vascular: Normal arterial flow voids Skull and upper cervical spine: Negative Sinuses/Orbits: Sinuses clear.  Bilateral cataract removal. Other: None MRA HEAD FINDINGS Image quality degraded by  motion.  Is Both vertebral arteries patent to the basilar. Basilar widely patent. PICA patent bilaterally. Posterior cerebral arteries patent bilaterally. Right internal carotid artery widely patent. Right anterior cerebral artery widely patent. Mild to moderate stenosis distal right middle cerebral artery. Moderate to severe stenosis proximal  right M2 branch. This vessel was thrombosed previously and appears patent with a significant stenosis which may be due to underlying atherosclerotic disease. Left internal carotid artery patent. Left anterior and middle cerebral arteries patent bilaterally. Negative for aneurysm IMPRESSION: Acute right MCA infarct involving the right insula and frontal parietal lobe. Negative for hemorrhage. Small acute or subacute infarct left parietal lobe. Subacute infarct left occipital lobe. Findings suggest emboli. Status post revascularization of right middle cerebral artery occlusion. There remains moderate to severe stenosis proximal right M2 segment which may be due to underlying atherosclerotic disease. Electronically Signed   By: Franchot Gallo M.D.   On: 05/28/2017 17:24   Mr Brain Wo Contrast  Result Date: 05/28/2017 CLINICAL DATA:  Stroke.  Endovascular clot retrieval 05/27/2017 EXAM: MRI HEAD WITHOUT CONTRAST MRA HEAD WITHOUT CONTRAST TECHNIQUE: Multiplanar, multiecho pulse sequences of the brain and surrounding structures were obtained without intravenous contrast. Angiographic images of the head were obtained using MRA technique without contrast. COMPARISON:  CTA 05/27/2017 FINDINGS: MRI HEAD FINDINGS Brain: Right MCA infarct with restricted diffusion in the right insula and right parietal lobe. Smaller area of acute infarct in the right frontal lobe. Small areas of acute infarct in the high right parietal lobe. No associated hemorrhage Restricted diffusion in the left parietal cortex compatible with a small area of acute infarct. Restricted diffusion in the left  occipital lobe correlating to hypodensity on CT. This is compatible with subacute infarct. Negative for hemorrhage. 1 cm extra-axial mass left parietal convexity compatible with meningioma. No mass-effect Mild atrophy.  Negative for hydrocephalus. Vascular: Normal arterial flow voids Skull and upper cervical spine: Negative Sinuses/Orbits: Sinuses clear.  Bilateral cataract removal. Other: None MRA HEAD FINDINGS Image quality degraded by motion.  Is Both vertebral arteries patent to the basilar. Basilar widely patent. PICA patent bilaterally. Posterior cerebral arteries patent bilaterally. Right internal carotid artery widely patent. Right anterior cerebral artery widely patent. Mild to moderate stenosis distal right middle cerebral artery. Moderate to severe stenosis proximal right M2 branch. This vessel was thrombosed previously and appears patent with a significant stenosis which may be due to underlying atherosclerotic disease. Left internal carotid artery patent. Left anterior and middle cerebral arteries patent bilaterally. Negative for aneurysm IMPRESSION: Acute right MCA infarct involving the right insula and frontal parietal lobe. Negative for hemorrhage. Small acute or subacute infarct left parietal lobe. Subacute infarct left occipital lobe. Findings suggest emboli. Status post revascularization of right middle cerebral artery occlusion. There remains moderate to severe stenosis proximal right M2 segment which may be due to underlying atherosclerotic disease. Electronically Signed   By: Franchot Gallo M.D.   On: 05/28/2017 17:24   Ct Cerebral Perfusion W Contrast  Result Date: 05/27/2017 CLINICAL DATA:  Code stroke.  Left-sided deficits. EXAM: CT ANGIOGRAPHY HEAD AND NECK CT PERFUSION BRAIN TECHNIQUE: Multidetector CT imaging of the head and neck was performed using the standard protocol during bolus administration of intravenous contrast. Multiplanar CT image reconstructions and MIPs were obtained to  evaluate the vascular anatomy. Carotid stenosis measurements (when applicable) are obtained utilizing NASCET criteria, using the distal internal carotid diameter as the denominator. Multiphase CT imaging of the brain was performed following IV bolus contrast injection. Subsequent parametric perfusion maps were calculated using RAPID software. CONTRAST:  90 mL Isovue 370 COMPARISON:  Head CT 05/27/2017 FINDINGS: CTA NECK FINDINGS Aortic arch: There is moderate calcific atherosclerosis of the aortic arch. There is no aneurysm, dissection or hemodynamically significant stenosis of the visualized ascending aorta  and aortic arch. Conventional 3 vessel aortic branching pattern. The visualized proximal subclavian arteries are normal. Right carotid system: The right common carotid origin is widely patent. There is no common carotid or internal carotid artery dissection or aneurysm. Atherosclerotic calcification at the carotid bifurcation without hemodynamically significant stenosis. Left carotid system: The left common carotid origin is widely patent. There is no common carotid or internal carotid artery dissection or aneurysm. Atherosclerotic calcification at the carotid bifurcation without hemodynamically significant stenosis. Vertebral arteries: The vertebral system is left dominant. Both vertebral artery origins are normal. Both vertebral arteries are normal to their confluence with the basilar artery. Skeleton: Multilevel cervical facet arthrosis. No bony spinal canal stenosis. No skull lesion. Other neck: The nasopharynx is clear. The oropharynx and hypopharynx are normal. The epiglottis is normal. The supraglottic larynx, glottis and subglottic larynx are normal. No retropharyngeal collection. The parapharyngeal spaces are preserved. The parotid and submandibular glands are normal. No sialolithiasis or salivary ductal dilatation. The thyroid gland is normal. There is no cervical lymphadenopathy. Upper chest:  Incompletely visualized right pleural effusion. Review of the MIP images confirms the above findings CTA HEAD FINDINGS Anterior circulation: --Intracranial internal carotid arteries: Normal. --Anterior cerebral arteries: The right middle cerebral artery M 1 segment is occluded at its distal aspect. There is very poor collateralization in the right MCA territory. The left middle cerebral artery is normal. --Middle cerebral arteries: Normal. --Posterior communicating arteries: Absent bilaterally. Posterior circulation: --Posterior cerebral arteries: Normal. --Superior cerebellar arteries: Normal. --Basilar artery: Normal. --Anterior inferior cerebellar arteries: Normal. --Posterior inferior cerebellar arteries: Normal. Venous sinuses: As permitted by contrast timing, patent. Anatomic variants: None Delayed phase: Not performed. 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 Review of the MIP images confirms the above findings. CT Brain Perfusion Findings: CBF (<30%) Volume: 66mL Perfusion (Tmax>6.0s) volume: 14mL Mismatch Volume: 129mL Infarction Location:Right MCA territory IMPRESSION: 1. Emergent large vessel occlusion of the distal M1 segment of the right middle cerebral artery. Very poor collateralization throughout the right MCA territory. 2. Right MCA territory infarct of 52 mL with 108 mL ischemic penumbra. 3. No midline shift or other mass effect.  No acute hemorrhage. 4. Hila convexity meningioma. 5. Carotid and aortic atherosclerosis (ICD10-I70.0) without hemodynamically significant stenosis of the carotid or vertebral arteries. These results were communicated to Dr. Kerney Elbe at 6:27 pm on 05/27/2017 by text page via the [facility] messaging system. I am currently awaiting confirmatory telephone call. Electronically Signed   By: Ulyses Jarred M.D.   On: 05/27/2017 18:33   Dg Chest Port 1 View  Result Date: 05/28/2017 CLINICAL DATA:  84 year old and stroke. EXAM: PORTABLE CHEST 1 VIEW COMPARISON:   02/21/2010 FINDINGS: Left dual-chamber cardiac pacemaker. The pacemaker leads are incompletely evaluated. Patient is rotated towards the right. Hazy densities in the right lung. Heart size is within normal limits. Negative for pneumothorax. Atherosclerotic calcifications at the aortic arch. IMPRESSION: Hazy densities in the right lung. Findings are nonspecific but could be related to mild asymmetric edema and recommend continued follow-up. Electronically Signed   By: Markus Daft M.D.   On: 05/28/2017 09:06   Vas [place] Groin Pseudoaneurysm  Result Date: 05/28/2017  ARTERIAL PSEUDOANEURYSM  Exam: Right groin History: S/p catheterization.  Examination Guidelines: A complete evaluation includes B-mode imaging, spectral doppler, color doppler, and power doppler as needed of all accessible portions of each vessel. Bilateral testing is considered an integral part of a complete examination. Limited examinations for reoccurring indications may be performed as noted. +------------+----------+---------+------+----------+  Right DuplexPSV (cm/s)Waveform PlaqueComment(s) +------------+----------+---------+------+----------+ CFA                   triphasic                 +------------+----------+---------+------+----------+  Final Interpretation No evidence of pseudoaneurysm, AVF, or DVT in the right groin.  Harold Barban Electronically signed by Harold Barban on 05/28/2017 at 45:80:99 PM.   --------------------------------------------------------------------------------  Final   Ct Head Code Stroke Wo Contrast  Result Date: 05/27/2017 CLINICAL DATA:  Code stroke. 84 year old male with left side neurologic deficits. Last seen normal 1630 hrs. EXAM: CT HEAD WITHOUT CONTRAST TECHNIQUE: Contiguous axial images were obtained from the base of the skull through the vertex without intravenous contrast. COMPARISON:  No prior head CT. FINDINGS: Brain: Confluent hypodensity in the right inferior frontal gyrus near the  operculum appears mostly confined to the white matter. No other right MCA territory cytotoxic edema is identified. There is a cytotoxic edema in the medial and posterior left occipital lobe with no regional mass effect. Other scattered white matter hypodensity, but no other cytotoxic edema identified. No acute intracranial hemorrhage identified. No ventriculomegaly. No intracranial mass effect. There does appear to be a small area of chronic cortical encephalomalacia in the left posterior frontal lobe (sagittal image 49). Vascular: Hyperdense right MCA and right MCA bifurcation best demonstrated on coronal image 29 and sagittal image 19. Dominant appearing distal left vertebral artery. Skull: No acute osseous abnormality identified. Sinuses/Orbits: Clear. Other: No acute findings. ASPECTS [facility] Stroke Program Early CT Score) - Ganglionic level infarction (caudate, lentiform nuclei, internal capsule, insula, M1-M3 cortex): 6 (abnormal right M1 segment) - Supraganglionic infarction (M4-M6 cortex): 3 Total score (0-10 with 10 being normal): 9 IMPRESSION: 1. Positive for hyperdense right MCA. Hypodensity in the right frontal lobe appears mostly confined to the white matter. ASPECTS is 9. No hemorrhage or mass effect. 2. Subacute appearing left PCA territory infarct with no hemorrhage or mass effect. 3. These results were communicated to Dr. Cheral Marker at 5:53 pmon 2/11/2019by text page via the [facility] messaging system. Electronically Signed   By: Genevie Ann M.D.   On: 05/27/2017 17:54   TTE  - Left ventricle: The cavity size was normal. Wall thickness was   increased in a pattern of mild LVH. Systolic function was normal.   The estimated ejection fraction was in the range of 60% to 65%.   Wall motion was normal; there were no regional wall motion   abnormalities. Features are consistent with a pseudonormal left   ventricular filling pattern, with concomitant abnormal relaxation   and increased filling pressure  (grade 2 diastolic dysfunction). - Aortic valve: Trileaflet; mildly thickened, mildly calcified   leaflets. - Aorta: The aorta was mildly calcified. - Mitral valve: There was trivial regurgitation. - Left atrium: The atrium was moderately dilated. - Right ventricle: The cavity size was mildly dilated. Wall   thickness was normal. Pacer wire or catheter noted in right   ventricle. - Right atrium: The atrium was mildly dilated. - Tricuspid valve: There was mild regurgitation. Impressions: - No cardiac source of emboli was indentified.   PHYSICAL EXAM  Temp:  [97.4 F (36.3 C)-98.7 F (37.1 C)] 98.5 F (36.9 C) (02/12 2000) Pulse Rate:  [46-118] 59 (02/12 2000) Resp:  [12-28] 20 (02/12 2000) BP: (94-179)/(53-148) 126/73 (02/12 2000) SpO2:  [87 %-100 %] 97 % (02/12 2000) Arterial Line BP: (111-169)/(46-78) 141/66 (02/12 1500)  General - Well nourished, well developed, in no  apparent distress.  Ophthalmologic - fundi not visualized due to noncooperation.  Cardiovascular - Regular rate and rhythm with pacing.  Neuro - awake alert, orientated to time, place and people. Eyes open, PERRL, follows simple commands on the right. Hard of hearing bilaterally. Right gaze and not able to cross midline, left neglect. Not blinking to visual threat on the left. Left facial droop. Tongue midline. Left facial sensation decreased. LUE 0/5 mild withdraw with pain. LLE 2/5 on painful stimulation. RUE 4/5 and RLE not able to test due to right groin hematoma. Bilateral babinski positive. DTR 1+. Sensation decreased on the left UE and LE, coordination intact right UE and gait not tested.     ASSESSMENT/PLAN [name] is a 84 y.o. male with history of A. fib s/p pacemaker not on AC due to recent ?? SDH, hypertension admitted for acute onset left-sided weakness and right gaze. No tPA given due to left PCA in the right MCA hypodensity on CT.  Status post endovascular treatment.  Stroke:  Large  right MCA, small left MCA, moderate left PCA infarct embolic secondary to A. fib not on AC  Resultant right gaze, left hemiparesis  CT head left PCA right MCA subacute infarcts  CTA head and neck right M1 cutoff  CT perfusion positive penumbra patter  DSA righ M1 TICI2b reperfusiont   MRI large right MCA, small left MCA, and moderate left PCA infarcts  MRA persistent right M2 high-grade stenosis status post endovascular treatment  2D Echo EF 60-65%  LDL 77  HgbA1c 5.4  Lovenox for VTE prophylaxis Fall precautions  Diet NPO time specified   No antithrombotic prior to admission, now on aspirin 325 mg daily.   Patient counseled to be compliant with his antithrombotic medications  Ongoing aggressive stroke risk factor management  Therapy recommendations:  CIR  Disposition:  Pending  ?? SDH  9-01/2017 had a fall, did not hit head - felt leg weakness  Stopped eliquis early 04/2017 due to cardiology concerning for "bleeding in head" - No CT head image or report to review  Followed with Dr. Bridgette Habermann at [facility] and repeat CT head on 05/10/17 showed no bleeding but small meningioma - ?? Assumed bleeding absorbed?? - but recommend ASA instead of AC  Current MRI did not show evidence of prior bleeding  Atrial fibrillation  Intermittent atrial fibrillation status post pacemaker  Was on Eliquis  Eliquis stopped early 04/2017  Currently rate controlled with atenolol  Felt to be safe resume Eliquis in 7-10 days due to large size of infarct  Hypertension Stable BP goal less than 160 status post procedure On atenolol and Lasix as needed  Long term BP goal normotensive  Hyperlipidemia  Home meds: Zocor  LDL 77, goal < 70  Now on Zocor resumed  Continue statin at discharge  Other Stroke Risk Factors  Advanced age  Other Active Problems  Right groin bleeding -ultrasound no pseudoaneurysm  Left frontoparietal small meningioma  Hospital day # 1  This  patient is critically ill due to embolic stroke, large right MCA stroke, A. fib not on anticoagulation, questionable history of the SDH and at significant risk of neurological worsening, death form recurrent stroke, hemorrhagic conversion, heart failure, brain bleed. This patient's care requires constant monitoring of vital signs, hemodynamics, respiratory and cardiac monitoring, review of multiple databases, neurological assessment, discussion with family, other specialists and medical decision making of high complexity. I had long discussion with daughter and wife at bedside, updated pt  current condition, treatment plan and potential prognosis. They expressed understanding and appreciation.  I spent 45 minutes of neurocritical care time in the care of this patient.   Rosalin Hawking, MD PhD Stroke Neurology 05/28/2017 9:56 PM    To contact Stroke Continuity provider, please refer to [email or web address] After hours, contact General Neurology

## 2017-05-28 NOTE — Transfer of Care (Signed)
Immediate Anesthesia Transfer of Care Note  Patient: Derek Hays  Procedure(s) Performed: RADIOLOGY WITH ANESTHESIA (N/A )  Patient Location: NICU  Anesthesia Type:General  Level of Consciousness: sedated  Airway & Oxygen Therapy: Patient connected to face mask oxygen  Post-op Assessment: Report given to RN and Post -op Vital signs reviewed and stable  Post vital signs: Reviewed and stable  Last Vitals:  Vitals:   05/27/17 1832 05/27/17 1900  BP: (!) 145/77 139/84  Pulse: 60 79  Resp: (!) 22 15  Temp:    SpO2: 94% 94%    Last Pain:  Vitals:   05/27/17 1837  TempSrc:   PainSc: 0-No pain         Complications: No apparent anesthesia complications

## 2017-05-28 NOTE — Evaluation (Signed)
Physical Therapy Evaluation Patient Details Name: Derek Hays MRN: [id number] DOB: 01/26/1931 Today's Date: 05/28/2017   History of Present Illness  84 yo admitted with left weakness and difficulty speaking with R MCA and L PCA infarcts s/p revascularization with R groin hematoma. PMhx: Afib, HTN, pacemaker  Clinical Impression  Pt pleasant and HOH even with hearing aids in place. Pt with significant left neglect, flaccid LUE, decreased strength LLE, impaired balance, function and sensation who will benefit from acute therapy to maximize mobility, function and strength to decrease burden of care. PT would benefit from CIR to maximize function. Family educated for visiting from left and increasing pt awareness attention to left.      Follow Up Recommendations CIR;Supervision/Assistance - 24 hour    Equipment Recommendations  Wheelchair cushion (measurements PT);Wheelchair (measurements PT);Hospital bed    Recommendations for Other Services Rehab consult;Speech consult     Precautions / Restrictions Precautions Precautions: Fall Precaution Comments: left neglect, pusher      Mobility  Bed Mobility Overal bed mobility: Needs Assistance Bed Mobility: Supine to Sit;Sit to Supine     Supine to sit: Max assist;+2 for physical assistance;HOB elevated Sit to supine: Max assist;+2 for physical assistance   General bed mobility comments: assist to pivot legs to EOB and elevate trunk with HOB 35 degrees. With return to supine assist to bend onto right forearm with assist to bring legs onto surface, total assist to slide up in bed  Transfers Overall transfer level: Needs assistance   Transfers: Sit to/from Stand Sit to Stand: Mod assist;+2 physical assistance         General transfer comment: bil knees blocked, cues for anterior right translation with assist to rise, pt unable to achieve fully upright. x 2 trials with belt and pad with attempt to pivot hips toward right pt  resitant despite 2 person assist and unable to budge hips. Returned to sitting for lift to chair however after pad placed lift would not function and pt returned to supine  Ambulation/Gait             General Gait Details: unable  Stairs            Wheelchair Mobility    Modified Rankin (Stroke Patients Only) Modified Rankin (Stroke Patients Only) Pre-Morbid Rankin Score: No significant disability Modified Rankin: Severe disability     Balance Overall balance assessment: Needs assistance Sitting-balance support: Single extremity supported;Feet supported Sitting balance-Leahy Scale: Zero Sitting balance - Comments: max assist in sitting for upright, pt with tendency to push with RUE and even with RUE on rail pt requires assist for midline and drifts into trunk flexion Postural control: Left lateral lean;Posterior lean   Standing balance-Leahy Scale: Zero                               Pertinent Vitals/Pain Pain Assessment: 0-10 Pain Score: 4  Pain Location: back Pain Descriptors / Indicators: Aching Pain Intervention(s): Limited activity within patient's tolerance;Repositioned;Monitored during session    Home Living Family/patient expects to be discharged to:: Private residence Living Arrangements: Spouse/significant other Available Help at Discharge: Family;Available 24 hours/day Type of Home: House Home Access: Stairs to enter   [employer] of Steps: 1 Home Layout: One level Home Equipment: Bedside commode;Grab bars - tub/shower;Grab bars - toilet;Walker - 2 wheels;Cane - single point;Crutches      Prior Function Level of Independence: Independent  Comments: ADLs, iADLs, driving. using RW, rollator, and cane for mobility     Hand Dominance   Dominant Hand: Right    Extremity/Trunk Assessment   Upper Extremity Assessment Upper Extremity Assessment: Defer to OT evaluation    Lower Extremity Assessment Lower  Extremity Assessment: LLE deficits/detail LLE Deficits / Details: grossly 2/5 pt able to move it spontaneously but not moving it on command    Cervical / Trunk Assessment Cervical / Trunk Assessment: Kyphotic;Other exceptions Cervical / Trunk Exceptions: right neck rotation with flexion, able to achieve midline and some left neck rotation with assist and cues  Communication   Communication: HOH  Cognition Arousal/Alertness: Awake/alert Behavior During Therapy: Flat affect Overall Cognitive Status: Impaired/Different from baseline Area of Impairment: Attention;Memory;Following commands;Safety/judgement;Problem solving                   Current Attention Level: Focused Memory: Decreased short-term memory Following Commands: Follows one step commands inconsistently;Follows one step commands with increased time Safety/Judgement: Decreased awareness of safety;Decreased awareness of deficits   Problem Solving: Slow processing;Decreased initiation;Difficulty sequencing;Requires verbal cues;Requires tactile cues General Comments: left neglect,  pinching his cheek on the left stating it is a washcloth, holding his LUE with RUE stated it is this therapists arm, pt perseverating on kids and wifes location in room      General Comments      Exercises     Assessment/Plan    PT Assessment Patient needs continued PT services  PT Problem List Decreased strength;Decreased mobility;Decreased safety awareness;Impaired tone;Decreased coordination;Decreased knowledge of precautions;Obesity;Decreased activity tolerance;Decreased cognition;Decreased balance;Decreased knowledge of use of DME;Impaired sensation;Pain       PT Treatment Interventions Therapeutic exercise;Patient/family education;Balance training;Functional mobility training;Neuromuscular re-education;DME instruction;Therapeutic activities;Cognitive remediation    PT Goals (Current goals can be found in the Care Plan section)   Acute Rehab PT Goals Patient Stated Goal: be able to stand PT Goal Formulation: With patient/family Time For Goal Achievement: 06/11/17 Potential to Achieve Goals: Fair    Frequency Min 4X/week   Barriers to discharge Decreased caregiver support      Co-evaluation PT/OT/SLP Co-Evaluation/Treatment: Yes Reason for Co-Treatment: Complexity of the patient's impairments (multi-system involvement);For patient/therapist safety;Necessary to address cognition/behavior during functional activity PT goals addressed during session: Mobility/safety with mobility;Balance         AM-PAC PT "6 Clicks" Daily Activity  Outcome Measure Difficulty turning over in bed (including adjusting bedclothes, sheets and blankets)?: Unable Difficulty moving from lying on back to sitting on the side of the bed? : Unable Difficulty sitting down on and standing up from a chair with arms (e.g., wheelchair, bedside commode, etc,.)?: Unable Help needed moving to and from a bed to chair (including a wheelchair)?: Total Help needed walking in hospital room?: Total Help needed climbing 3-5 steps with a railing? : Total 6 Click Score: 6    End of Session Equipment Utilized During Treatment: Gait belt Activity Tolerance: Patient tolerated treatment well Patient left: in bed;with call bell/phone within [place];with bed alarm set;with family/visitor present Nurse Communication: Mobility status;Need for lift equipment PT Visit Diagnosis: Unsteadiness on feet (R26.81);Other symptoms and signs involving the nervous system (R29.898);Hemiplegia and hemiparesis;Other abnormalities of gait and mobility (R26.89) Hemiplegia - Right/Left: Left Hemiplegia - dominant/non-dominant: Non-dominant Hemiplegia - caused by: Cerebral infarction    Time: 7322-0254 PT Time Calculation (min) (ACUTE ONLY): 48 min   Charges:   PT Evaluation $PT Eval Moderate Complexity: 1 Mod     PT G Codes:  Derek Hays,  Derek Hays   Derek Hays Derek Hays 05/28/2017, 1:43 PM

## 2017-05-28 NOTE — Progress Notes (Signed)
Pt arrived to 4N with a foley that had a broken seal.

## 2017-05-28 NOTE — Progress Notes (Signed)
Pt under the care of anesthesia. Transferred to 4N, report given at bedside. Right groin site unremarkable at this time reviewed with Taylar, RN. Pt vitals stable. Does not follow commands at this time. Moves all extremities; right side voluntarily and left side to painful stimuli.

## 2017-05-28 NOTE — Progress Notes (Signed)
Rehab Admissions Coordinator Note:  Patient was screened by Cleatrice Burke for appropriateness for an Inpatient Acute Rehab Consult per PT recommendation.  At this time, we are recommending Inpatient Rehab consult.  Cleatrice Burke 05/28/2017, 2:29 PM  I can be reached at [phone].

## 2017-05-28 NOTE — Progress Notes (Signed)
  Echocardiogram 2D Echocardiogram has been performed.  Merrie Roof F 05/28/2017, 2:59 PM

## 2017-05-29 ENCOUNTER — Inpatient Hospital Stay (HOSPITAL_COMMUNITY): Payer: Medicare Other

## 2017-05-29 ENCOUNTER — Encounter (HOSPITAL_COMMUNITY): Payer: Self-pay | Admitting: Interventional Radiology

## 2017-05-29 DIAGNOSIS — E785 Hyperlipidemia, unspecified: Secondary | ICD-10-CM

## 2017-05-29 LAB — BASIC METABOLIC PANEL
Anion gap: 12 (ref 5–15)
BUN: 25 mg/dL — ABNORMAL HIGH (ref 6–20)
CHLORIDE: 111 mmol/L (ref 101–111)
CO2: 17 mmol/L — ABNORMAL LOW (ref 22–32)
Calcium: 8.7 mg/dL — ABNORMAL LOW (ref 8.9–10.3)
Creatinine, Ser: 1.32 mg/dL — ABNORMAL HIGH (ref 0.61–1.24)
GFR, EST AFRICAN AMERICAN: 55 mL/min — AB (ref >60)
GFR, EST NON AFRICAN AMERICAN: 48 mL/min — AB (ref >60)
GLUCOSE: 118 mg/dL — AB (ref 65–99)
Potassium: 4.1 mmol/L (ref 3.5–5.1)
SODIUM: 140 mmol/L (ref 135–145)

## 2017-05-29 LAB — CBC
HEMATOCRIT: 34.6 % — AB (ref 39.0–52.0)
HEMOGLOBIN: 11.5 g/dL — AB (ref 13.0–17.0)
MCH: 29.9 pg (ref 26.0–34.0)
MCHC: 33.2 g/dL (ref 30.0–36.0)
MCV: 89.9 fL (ref 78.0–100.0)
Platelets: 230 10*3/uL (ref 150–400)
RBC: 3.85 MIL/uL — ABNORMAL LOW (ref 4.22–5.81)
RDW: 13.7 % (ref 11.5–15.5)
WBC: 12.4 10*3/uL — ABNORMAL HIGH (ref 4.0–10.5)

## 2017-05-29 MED ORDER — LABETALOL HCL 5 MG/ML IV SOLN: 10.0000 mg | INTRAVENOUS | Status: DC | PRN

## 2017-05-29 MED ORDER — RESOURCE THICKENUP CLEAR PO POWD
ORAL | Status: DC | PRN
  Filled 2017-05-29: qty 125

## 2017-05-29 NOTE — Evaluation (Signed)
Speech Language Pathology Evaluation Patient Details Name: Derek Hays MRN: [id number] DOB: 11/08/1930 Today's Date: 05/29/2017 Time: 0915-0950 SLP Time Calculation (min) (ACUTE ONLY): 35 min  Problem List:  Patient Active Problem List   Diagnosis Date Noted  . 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 Stroke (cerebrum) ([place]) 05/27/2017   Past Medical History:  Past Medical History:  Diagnosis Date  . Atrial fibrillation ([place])   . Hypertension    Past Surgical History:  Past Surgical History:  Procedure Laterality Date  . IR CT HEAD LTD  05/27/2017  . IR PERCUTANEOUS ART THROMBECTOMY/INFUSION INTRACRANIAL INC DIAG ANGIO  05/27/2017  . RADIOLOGY WITH ANESTHESIA N/A 05/27/2017   Procedure: RADIOLOGY WITH ANESTHESIA;  Surgeon: Luanne Bras, MD;  Location: [facility] [facility];  Service: Radiology;  Laterality: N/A;   HPI:  Mr.Derek C Harrellis a 84 y.o.malewith history of A. fibs/ppacemaker not on AC due to recent?? SDH, hypertensionadmitted foracute onset left-sided weaknessand right gaze. No tPA given due toleftPCA in the right MCA hypodensity on CT.Status post endovascular treatment. MRI shows Acute right MCA infarct involving the right insula and frontal parietal lobe. Small acute or subacute infarct left parietal lobe. Subacute infarct left occipital lobe. Findings suggest emboli.   Assessment / Plan / Recommendation Clinical Impression  Pt demonstrates cognitive deficits primarily associated with right hemisphere disorder including right visual field preference with the ability to attend to midline and slightly left with verbal and visual cues in functional tasks. Longer term memory and language skills are very good, but short term memory of new hospital learning and events is impaired, but improved with verbal cues, suggesting attention and storage are adequate but recall is impaired. Pt responds well to verbal cueing to improve emergent awareness of deficits but  will need intensive therapy to address carry over for improved independence and safety awareness. Recommend CIR at d/c.     SLP Assessment  SLP Recommendation/Assessment: Patient needs continued Speech Lanaguage Pathology Services SLP Visit Diagnosis: Dysphagia, oropharyngeal phase (R13.12)    Follow Up Recommendations  Inpatient Rehab    Frequency and Duration min 2x/week  2 weeks      SLP Evaluation Cognition  Overall Cognitive Status: Impaired/Different from baseline Arousal/Alertness: Awake/alert Orientation Level: Oriented X4 Attention: Sustained;Selective Sustained Attention: Appears intact Selective Attention: Appears intact Memory: Impaired Memory Impairment: Decreased recall of new information;Prospective memory Awareness: Impaired Awareness Impairment: Emergent impairment;Anticipatory impairment(intellecutual awareness developing) Problem Solving: Impaired Problem Solving Impairment: Functional basic Safety/Judgment: Impaired       Comprehension  Auditory Comprehension Overall Auditory Comprehension: Appears within functional limits for tasks assessed    Expression Verbal Expression Overall Verbal Expression: Appears within functional limits for tasks assessed   Oral / Motor  Oral Motor/Sensory Function Overall Oral Motor/Sensory Function: Moderate impairment Facial ROM: Reduced left;Suspected CN VII (facial) dysfunction Facial Symmetry: Abnormal symmetry left;Suspected CN VII (facial) dysfunction Facial Strength: Reduced left;Suspected CN VII (facial) dysfunction Facial Sensation: Reduced left;Suspected CN V (Trigeminal) dysfunction Lingual ROM: Reduced left;Suspected CN XII (hypoglossal) dysfunction Lingual Symmetry: Within Functional Limits Lingual Strength: Reduced;Suspected CN XII (hypoglossal) dysfunction Lingual Sensation: Suspected CN VII (facial) dysfunction-anterior 2/3 tongue;Reduced Velum: Within Functional Limits Mandible: Within Functional  Limits Motor Speech Overall Motor Speech: Impaired Respiration: Within functional limits Phonation: Normal Articulation: Impaired Level of Impairment: Conversation Intelligibility: Intelligible Motor Planning: Witnin functional limits Motor Speech Errors: Aware;Consistent   GO                   Herbie Baltimore, MA CCC-SLP  [phone]  Derek Hays, Derek Hays 05/29/2017, 11:44 AM

## 2017-05-29 NOTE — Progress Notes (Signed)
Physical Therapy Treatment Patient Details Name: Derek Hays MRN: [id number] DOB: September 25, 1930 Today's Date: 05/29/2017    History of Present Illness 84 yo admitted with left weakness and difficulty speaking with R MCA and L PCA infarcts s/p revascularization with R groin hematoma. PMhx: Afib, HTN, pacemaker    PT Comments    Patient seen for activity progression with goal intended of OOB to chair. Patient tolerated well but continues to require max to total assist (2 persons) more all activities. Patient positioned upright in chair upon conclusion of session. Will continue to see and progress as tolerated.    Follow Up Recommendations  CIR;Supervision/Assistance - 24 hour     Equipment Recommendations  Wheelchair cushion (measurements PT);Wheelchair (measurements PT);Hospital bed    Recommendations for Other Services Rehab consult;Speech consult     Precautions / Restrictions Precautions Precautions: Fall Precaution Comments: left neglect, pusher Restrictions Weight Bearing Restrictions: No    Mobility  Bed Mobility Overal bed mobility: Needs Assistance Bed Mobility: Rolling     Supine to sit: Max assist;+2 for physical assistance;HOB elevated     General bed mobility comments: modest use of RUE with cues  Transfers Overall transfer level: Needs assistance   Transfers: Lateral/Scoot Transfers Sit to Stand: Max assist;+2 physical assistance         General transfer comment: performed a 2 person lateral slide transfer OOB to chair, patient able to initiate some RLE elevation during positioning in chair and use of RUE on right arm rest of chair  Ambulation/Gait                 Stairs            Wheelchair Mobility    Modified Rankin (Stroke Patients Only) Modified Rankin (Stroke Patients Only) Pre-Morbid Rankin Score: No significant disability Modified Rankin: Severe disability     Balance Overall balance assessment: Needs  assistance Sitting-balance support: Single extremity supported Sitting balance-Leahy Scale: Poor Sitting balance - Comments: very minimal trunk engagement with activity, able to use RUE to pull to upright and reposition with max assist                                    Cognition Arousal/Alertness: Awake/alert(initially lethargic but aroused with activity) Behavior During Therapy: Flat affect Overall Cognitive Status: Impaired/Different from baseline Area of Impairment: Attention;Memory;Following commands;Safety/judgement;Problem solving                   Current Attention Level: Focused Memory: Decreased short-term memory Following Commands: Follows one step commands inconsistently;Follows one step commands with increased time Safety/Judgement: Decreased awareness of safety;Decreased awareness of deficits   Problem Solving: Slow processing;Decreased initiation;Difficulty sequencing;Requires verbal cues;Requires tactile cues General Comments: patient continues to demonstrates left side inattention but is able to come past midline line with VCs      Exercises Other Exercises Other Exercises: performed PROM and AAROM LLE extension with trace activation for push out Other Exercises: Heel cord stretch LLE Other Exercises: AROM RLE (through partial range)    General Comments        Pertinent Vitals/Pain Pain Assessment: 0-10 Pain Score: 4  Pain Location: back Pain Descriptors / Indicators: Aching Pain Intervention(s): Monitored during session    Home Living                      Prior Function  PT Goals (current goals can now be found in the care plan section) Acute Rehab PT Goals Patient Stated Goal: be able to stand PT Goal Formulation: With patient/family Time For Goal Achievement: 06/11/17 Potential to Achieve Goals: Fair Progress towards PT goals: Progressing toward goals    Frequency    Min 4X/week      PT Plan       Co-evaluation              AM-PAC PT "6 Clicks" Daily Activity  Outcome Measure  Difficulty turning over in bed (including adjusting bedclothes, sheets and blankets)?: Unable Difficulty moving from lying on back to sitting on the side of the bed? : Unable Difficulty sitting down on and standing up from a chair with arms (e.g., wheelchair, bedside commode, etc,.)?: Unable Help needed moving to and from a bed to chair (including a wheelchair)?: Total Help needed walking in hospital room?: Total Help needed climbing 3-5 steps with a railing? : Total 6 Click Score: 6    End of Session   Activity Tolerance: Patient tolerated treatment well Patient left: in chair;with call bell/phone within reach;with chair alarm set Nurse Communication: Mobility status PT Visit Diagnosis: Unsteadiness on feet (R26.81);Other symptoms and signs involving the nervous system (R29.898);Hemiplegia and hemiparesis;Other abnormalities of gait and mobility (R26.89) Hemiplegia - Right/Left: Left Hemiplegia - dominant/non-dominant: Non-dominant Hemiplegia - caused by: Cerebral infarction     Time: 0840-0906 PT Time Calculation (min) (ACUTE ONLY): 26 min  Charges:  $Therapeutic Exercise: 8-22 mins $Therapeutic Activity: 8-22 mins                    G Codes:       Alben Deeds, PT DPT  Board Certified Neurologic Specialist [phone]    Duncan Dull 05/29/2017, 9:24 AM

## 2017-05-29 NOTE — Anesthesia Postprocedure Evaluation (Signed)
Anesthesia Post Note  Patient: Derek Hays  Procedure(s) Performed: RADIOLOGY WITH ANESTHESIA (N/A )     Patient location during evaluation: ICU Anesthesia Type: General Level of consciousness: awake and alert Pain management: pain level controlled Vital Signs Assessment: post-procedure vital signs reviewed and stable Respiratory status: spontaneous breathing, nonlabored ventilation, respiratory function stable and patient connected to nasal cannula oxygen Cardiovascular status: blood pressure returned to baseline and stable Postop Assessment: no apparent nausea or vomiting Anesthetic complications: no    Last Vitals:  Vitals:   05/29/17 0700 05/29/17 0724  BP: (!) 149/75   Pulse: (!) 59   Resp: 20   Temp:  36.7 C  SpO2: 97%     Last Pain:  Vitals:   05/29/17 0724  TempSrc: Oral  PainSc:                   S

## 2017-05-29 NOTE — Progress Notes (Signed)
STROKE TEAM PROGRESS NOTE   SUBJECTIVE (INTERVAL HISTORY) His wife is at the bedside.  Overall he feels his condition is gradually improving. Still has right gaze and left hemiplegia. Passed swallow and on diet. Had urinary retention s/p in and out cath x 2.     OBJECTIVE Temp:  [97.4 F (36.3 C)-98.5 F (36.9 C)] 98 F (36.7 C) (02/13 1059) Pulse Rate:  [57-118] 59 (02/13 1200) Cardiac Rhythm: Ventricular paced (02/13 0800) Resp:  [13-27] 16 (02/13 1200) BP: (97-149)/(57-117) 131/90 (02/13 1200) SpO2:  [93 %-100 %] 99 % (02/13 1200) Arterial Line BP: (141-148)/(66-72) 141/66 (02/12 1500)  Recent Labs  Lab 05/27/17 1732  GLUCAP 128*   Recent Labs  Lab 05/27/17 1730 05/27/17 1739 05/28/17 0611 05/29/17 0455  NA 139 140 139 140  K 4.2 4.1 3.8 4.1  CL 105 105 110 111  CO2 24  --  17* 17*  GLUCOSE 128* 128* 187* 118*  BUN 26* 27* 24* 25*  CREATININE 1.46* 1.40* 1.35* 1.32*  CALCIUM 9.3  --  8.6* 8.7*   Recent Labs  Lab 05/27/17 1730  AST 22  ALT 10*  ALKPHOS 76  BILITOT 1.1  PROT 6.9  ALBUMIN 3.9   Recent Labs  Lab 05/27/17 1730 05/27/17 1739 05/28/17 0611 05/29/17 0455  WBC 7.5  --  9.2 12.4*  NEUTROABS 4.8  --  8.4*  --   HGB 14.7 14.6 12.6* 11.5*  HCT 42.4 43.0 36.8* 34.6*  MCV 89.5  --  89.3 89.9  PLT 218  --  216 230   No results for input(s): CKTOTAL, CKMB, CKMBINDEX, TROPONINI in the last 168 hours. Recent Labs    05/27/17 1730  LABPROT 13.2  INR 1.01   No results for input(s): COLORURINE, LABSPEC, PHURINE, GLUCOSEU, HGBUR, BILIRUBINUR, KETONESUR, PROTEINUR, UROBILINOGEN, NITRITE, LEUKOCYTESUR in the last 72 hours.  Invalid input(s): APPERANCEUR     Component Value Date/Time   CHOL 133 05/28/2017 0611   TRIG 66 05/28/2017 0611   HDL 43 05/28/2017 0611   CHOLHDL 3.1 05/28/2017 0611   VLDL 13 05/28/2017 0611   LDLCALC 77 05/28/2017 0611   Lab Results  Component Value Date   HGBA1C 5.4 05/28/2017   No results found for: LABOPIA,  COCAINSCRNUR, LABBENZ, AMPHETMU, THCU, LABBARB  No results for input(s): ETH in the last 168 hours.  I have personally reviewed the radiological images below and agree with the radiology interpretations.  Ct Angio Head W Or Wo Contrast  Result Date: 05/27/2017 CLINICAL DATA:  Code stroke.  Left-sided deficits. EXAM: CT ANGIOGRAPHY HEAD AND NECK CT PERFUSION BRAIN TECHNIQUE: Multidetector CT imaging of the head and neck was performed using the standard protocol during bolus administration of intravenous contrast. Multiplanar CT image reconstructions and MIPs were obtained to evaluate the vascular anatomy. Carotid stenosis measurements (when applicable) are obtained utilizing NASCET criteria, using the distal internal carotid diameter as the denominator. Multiphase CT imaging of the brain was performed following IV bolus contrast injection. Subsequent parametric perfusion maps were calculated using RAPID software. CONTRAST:  90 mL Isovue 370 COMPARISON:  Head CT 05/27/2017 FINDINGS: CTA NECK FINDINGS Aortic arch: There is moderate calcific atherosclerosis of the aortic arch. There is no aneurysm, dissection or hemodynamically significant stenosis of the visualized ascending aorta and aortic arch. Conventional 3 vessel aortic branching pattern. The visualized proximal subclavian arteries are normal. Right carotid system: The right common carotid origin is widely patent. There is no common carotid or internal carotid artery dissection or aneurysm.  Atherosclerotic calcification at the carotid bifurcation without hemodynamically significant stenosis. Left carotid system: The left common carotid origin is widely patent. There is no common carotid or internal carotid artery dissection or aneurysm. Atherosclerotic calcification at the carotid bifurcation without hemodynamically significant stenosis. Vertebral arteries: The vertebral system is left dominant. Both vertebral artery origins are normal. Both vertebral  arteries are normal to their confluence with the basilar artery. Skeleton: Multilevel cervical facet arthrosis. No bony spinal canal stenosis. No skull lesion. Other neck: The nasopharynx is clear. The oropharynx and hypopharynx are normal. The epiglottis is normal. The supraglottic larynx, glottis and subglottic larynx are normal. No retropharyngeal collection. The parapharyngeal spaces are preserved. The parotid and submandibular glands are normal. No sialolithiasis or salivary ductal dilatation. The thyroid gland is normal. There is no cervical lymphadenopathy. Upper chest: Incompletely visualized right pleural effusion. Review of the MIP images confirms the above findings CTA HEAD FINDINGS Anterior circulation: --Intracranial internal carotid arteries: Normal. --Anterior cerebral arteries: The right middle cerebral artery M 1 segment is occluded at its distal aspect. There is very poor collateralization in the right MCA territory. The left middle cerebral artery is normal. --Middle cerebral arteries: Normal. --Posterior communicating arteries: Absent bilaterally. Posterior circulation: --Posterior cerebral arteries: Normal. --Superior cerebellar arteries: Normal. --Basilar artery: Normal. --Anterior inferior cerebellar arteries: Normal. --Posterior inferior cerebellar arteries: Normal. Venous sinuses: As permitted by contrast timing, patent. Anatomic variants: None Delayed phase: Not performed. 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 Review of the MIP images confirms the above findings. CT Brain Perfusion Findings: CBF (<30%) Volume: 1mL Perfusion (Tmax>6.0s) volume: 170mL Mismatch Volume: 157mL Infarction Location:Right MCA territory IMPRESSION: 1. Emergent large vessel occlusion of the distal M1 segment of the right middle cerebral artery. Very poor collateralization throughout the right MCA territory. 2. Right MCA territory infarct of 52 mL with 108 mL ischemic penumbra. 3. No midline shift or other  mass effect.  No acute hemorrhage. 4. Hila convexity meningioma. 5. Carotid and aortic atherosclerosis (ICD10-I70.0) without hemodynamically significant stenosis of the carotid or vertebral arteries. These results were communicated to Dr. Kerney Elbe at 6:27 pm on 05/27/2017 by text page via the [facility] messaging system. I am currently awaiting confirmatory telephone call. Electronically Signed   By: Ulyses Jarred M.D.   On: 05/27/2017 18:33   Ct Angio Neck W Or Wo Contrast  Result Date: 05/27/2017 CLINICAL DATA:  Code stroke.  Left-sided deficits. EXAM: CT ANGIOGRAPHY HEAD AND NECK CT PERFUSION BRAIN TECHNIQUE: Multidetector CT imaging of the head and neck was performed using the standard protocol during bolus administration of intravenous contrast. Multiplanar CT image reconstructions and MIPs were obtained to evaluate the vascular anatomy. Carotid stenosis measurements (when applicable) are obtained utilizing NASCET criteria, using the distal internal carotid diameter as the denominator. Multiphase CT imaging of the brain was performed following IV bolus contrast injection. Subsequent parametric perfusion maps were calculated using RAPID software. CONTRAST:  90 mL Isovue 370 COMPARISON:  Head CT 05/27/2017 FINDINGS: CTA NECK FINDINGS Aortic arch: There is moderate calcific atherosclerosis of the aortic arch. There is no aneurysm, dissection or hemodynamically significant stenosis of the visualized ascending aorta and aortic arch. Conventional 3 vessel aortic branching pattern. The visualized proximal subclavian arteries are normal. Right carotid system: The right common carotid origin is widely patent. There is no common carotid or internal carotid artery dissection or aneurysm. Atherosclerotic calcification at the carotid bifurcation without hemodynamically significant stenosis. Left carotid system: The left common carotid origin is widely  patent. There is no common carotid or internal carotid artery  dissection or aneurysm. Atherosclerotic calcification at the carotid bifurcation without hemodynamically significant stenosis. Vertebral arteries: The vertebral system is left dominant. Both vertebral artery origins are normal. Both vertebral arteries are normal to their confluence with the basilar artery. Skeleton: Multilevel cervical facet arthrosis. No bony spinal canal stenosis. No skull lesion. Other neck: The nasopharynx is clear. The oropharynx and hypopharynx are normal. The epiglottis is normal. The supraglottic larynx, glottis and subglottic larynx are normal. No retropharyngeal collection. The parapharyngeal spaces are preserved. The parotid and submandibular glands are normal. No sialolithiasis or salivary ductal dilatation. The thyroid gland is normal. There is no cervical lymphadenopathy. Upper chest: Incompletely visualized right pleural effusion. Review of the MIP images confirms the above findings CTA HEAD FINDINGS Anterior circulation: --Intracranial internal carotid arteries: Normal. --Anterior cerebral arteries: The right middle cerebral artery M 1 segment is occluded at its distal aspect. There is very poor collateralization in the right MCA territory. The left middle cerebral artery is normal. --Middle cerebral arteries: Normal. --Posterior communicating arteries: Absent bilaterally. Posterior circulation: --Posterior cerebral arteries: Normal. --Superior cerebellar arteries: Normal. --Basilar artery: Normal. --Anterior inferior cerebellar arteries: Normal. --Posterior inferior cerebellar arteries: Normal. Venous sinuses: As permitted by contrast timing, patent. Anatomic variants: None Delayed phase: Not performed. 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 Review of the MIP images confirms the above findings. CT Brain Perfusion Findings: CBF (<30%) Volume: 49mL Perfusion (Tmax>6.0s) volume: 128mL Mismatch Volume: 117mL Infarction Location:Right MCA territory IMPRESSION: 1. Emergent large  vessel occlusion of the distal M1 segment of the right middle cerebral artery. Very poor collateralization throughout the right MCA territory. 2. Right MCA territory infarct of 52 mL with 108 mL ischemic penumbra. 3. No midline shift or other mass effect.  No acute hemorrhage. 4. Hila convexity meningioma. 5. Carotid and aortic atherosclerosis (ICD10-I70.0) without hemodynamically significant stenosis of the carotid or vertebral arteries. These results were communicated to Dr. Kerney Elbe at 6:27 pm on 05/27/2017 by text page via the [facility] messaging system. I am currently awaiting confirmatory telephone call. Electronically Signed   By: Ulyses Jarred M.D.   On: 05/27/2017 18:33   Mr Jodene Nam Head Wo Contrast  Result Date: 05/28/2017 CLINICAL DATA:  Stroke.  Endovascular clot retrieval 05/27/2017 EXAM: MRI HEAD WITHOUT CONTRAST MRA HEAD WITHOUT CONTRAST TECHNIQUE: Multiplanar, multiecho pulse sequences of the brain and surrounding structures were obtained without intravenous contrast. Angiographic images of the head were obtained using MRA technique without contrast. COMPARISON:  CTA 05/27/2017 FINDINGS: MRI HEAD FINDINGS Brain: Right MCA infarct with restricted diffusion in the right insula and right parietal lobe. Smaller area of acute infarct in the right frontal lobe. Small areas of acute infarct in the high right parietal lobe. No associated hemorrhage Restricted diffusion in the left parietal cortex compatible with a small area of acute infarct. Restricted diffusion in the left occipital lobe correlating to hypodensity on CT. This is compatible with subacute infarct. Negative for hemorrhage. 1 cm extra-axial mass left parietal convexity compatible with meningioma. No mass-effect Mild atrophy.  Negative for hydrocephalus. Vascular: Normal arterial flow voids Skull and upper cervical spine: Negative Sinuses/Orbits: Sinuses clear.  Bilateral cataract removal. Other: None MRA HEAD FINDINGS Image quality degraded by  motion.  Is Both vertebral arteries patent to the basilar. Basilar widely patent. PICA patent bilaterally. Posterior cerebral arteries patent bilaterally. Right internal carotid artery widely patent. Right anterior cerebral artery widely patent. Mild to moderate stenosis distal  right middle cerebral artery. Moderate to severe stenosis proximal right M2 branch. This vessel was thrombosed previously and appears patent with a significant stenosis which may be due to underlying atherosclerotic disease. Left internal carotid artery patent. Left anterior and middle cerebral arteries patent bilaterally. Negative for aneurysm IMPRESSION: Acute right MCA infarct involving the right insula and frontal parietal lobe. Negative for hemorrhage. Small acute or subacute infarct left parietal lobe. Subacute infarct left occipital lobe. Findings suggest emboli. Status post revascularization of right middle cerebral artery occlusion. There remains moderate to severe stenosis proximal right M2 segment which may be due to underlying atherosclerotic disease. Electronically Signed   By: Franchot Gallo M.D.   On: 05/28/2017 17:24   Mr Brain Wo Contrast  Result Date: 05/28/2017 CLINICAL DATA:  Stroke.  Endovascular clot retrieval 05/27/2017 EXAM: MRI HEAD WITHOUT CONTRAST MRA HEAD WITHOUT CONTRAST TECHNIQUE: Multiplanar, multiecho pulse sequences of the brain and surrounding structures were obtained without intravenous contrast. Angiographic images of the head were obtained using MRA technique without contrast. COMPARISON:  CTA 05/27/2017 FINDINGS: MRI HEAD FINDINGS Brain: Right MCA infarct with restricted diffusion in the right insula and right parietal lobe. Smaller area of acute infarct in the right frontal lobe. Small areas of acute infarct in the high right parietal lobe. No associated hemorrhage Restricted diffusion in the left parietal cortex compatible with a small area of acute infarct. Restricted diffusion in the left  occipital lobe correlating to hypodensity on CT. This is compatible with subacute infarct. Negative for hemorrhage. 1 cm extra-axial mass left parietal convexity compatible with meningioma. No mass-effect Mild atrophy.  Negative for hydrocephalus. Vascular: Normal arterial flow voids Skull and upper cervical spine: Negative Sinuses/Orbits: Sinuses clear.  Bilateral cataract removal. Other: None MRA HEAD FINDINGS Image quality degraded by motion.  Is Both vertebral arteries patent to the basilar. Basilar widely patent. PICA patent bilaterally. Posterior cerebral arteries patent bilaterally. Right internal carotid artery widely patent. Right anterior cerebral artery widely patent. Mild to moderate stenosis distal right middle cerebral artery. Moderate to severe stenosis proximal right M2 branch. This vessel was thrombosed previously and appears patent with a significant stenosis which may be due to underlying atherosclerotic disease. Left internal carotid artery patent. Left anterior and middle cerebral arteries patent bilaterally. Negative for aneurysm IMPRESSION: Acute right MCA infarct involving the right insula and frontal parietal lobe. Negative for hemorrhage. Small acute or subacute infarct left parietal lobe. Subacute infarct left occipital lobe. Findings suggest emboli. Status post revascularization of right middle cerebral artery occlusion. There remains moderate to severe stenosis proximal right M2 segment which may be due to underlying atherosclerotic disease. Electronically Signed   By: Franchot Gallo M.D.   On: 05/28/2017 17:24   Ct Cerebral Perfusion W Contrast  Result Date: 05/27/2017 CLINICAL DATA:  Code stroke.  Left-sided deficits. EXAM: CT ANGIOGRAPHY HEAD AND NECK CT PERFUSION BRAIN TECHNIQUE: Multidetector CT imaging of the head and neck was performed using the standard protocol during bolus administration of intravenous contrast. Multiplanar CT image reconstructions and MIPs were obtained to  evaluate the vascular anatomy. Carotid stenosis measurements (when applicable) are obtained utilizing NASCET criteria, using the distal internal carotid diameter as the denominator. Multiphase CT imaging of the brain was performed following IV bolus contrast injection. Subsequent parametric perfusion maps were calculated using RAPID software. CONTRAST:  90 mL Isovue 370 COMPARISON:  Head CT 05/27/2017 FINDINGS: CTA NECK FINDINGS Aortic arch: There is moderate calcific atherosclerosis of the aortic arch. There is no aneurysm, dissection  or hemodynamically significant stenosis of the visualized ascending aorta and aortic arch. Conventional 3 vessel aortic branching pattern. The visualized proximal subclavian arteries are normal. Right carotid system: The right common carotid origin is widely patent. There is no common carotid or internal carotid artery dissection or aneurysm. Atherosclerotic calcification at the carotid bifurcation without hemodynamically significant stenosis. Left carotid system: The left common carotid origin is widely patent. There is no common carotid or internal carotid artery dissection or aneurysm. Atherosclerotic calcification at the carotid bifurcation without hemodynamically significant stenosis. Vertebral arteries: The vertebral system is left dominant. Both vertebral artery origins are normal. Both vertebral arteries are normal to their confluence with the basilar artery. Skeleton: Multilevel cervical facet arthrosis. No bony spinal canal stenosis. No skull lesion. Other neck: The nasopharynx is clear. The oropharynx and hypopharynx are normal. The epiglottis is normal. The supraglottic larynx, glottis and subglottic larynx are normal. No retropharyngeal collection. The parapharyngeal spaces are preserved. The parotid and submandibular glands are normal. No sialolithiasis or salivary ductal dilatation. The thyroid gland is normal. There is no cervical lymphadenopathy. Upper chest:  Incompletely visualized right pleural effusion. Review of the MIP images confirms the above findings CTA HEAD FINDINGS Anterior circulation: --Intracranial internal carotid arteries: Normal. --Anterior cerebral arteries: The right middle cerebral artery M 1 segment is occluded at its distal aspect. There is very poor collateralization in the right MCA territory. The left middle cerebral artery is normal. --Middle cerebral arteries: Normal. --Posterior communicating arteries: Absent bilaterally. Posterior circulation: --Posterior cerebral arteries: Normal. --Superior cerebellar arteries: Normal. --Basilar artery: Normal. --Anterior inferior cerebellar arteries: Normal. --Posterior inferior cerebellar arteries: Normal. Venous sinuses: As permitted by contrast timing, patent. Anatomic variants: None Delayed phase: Not performed. 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 Review of the MIP images confirms the above findings. CT Brain Perfusion Findings: CBF (<30%) Volume: 56mL Perfusion (Tmax>6.0s) volume: 112mL Mismatch Volume: 122mL Infarction Location:Right MCA territory IMPRESSION: 1. Emergent large vessel occlusion of the distal M1 segment of the right middle cerebral artery. Very poor collateralization throughout the right MCA territory. 2. Right MCA territory infarct of 52 mL with 108 mL ischemic penumbra. 3. No midline shift or other mass effect.  No acute hemorrhage. 4. Hila convexity meningioma. 5. Carotid and aortic atherosclerosis (ICD10-I70.0) without hemodynamically significant stenosis of the carotid or vertebral arteries. These results were communicated to Dr. Kerney Elbe at 6:27 pm on 05/27/2017 by text page via the [facility] messaging system. I am currently awaiting confirmatory telephone call. Electronically Signed   By: Ulyses Jarred M.D.   On: 05/27/2017 18:33   Dg Chest Port 1 View  Result Date: 05/28/2017 CLINICAL DATA:  84 year old and stroke. EXAM: PORTABLE CHEST 1 VIEW COMPARISON:   02/21/2010 FINDINGS: Left dual-chamber cardiac pacemaker. The pacemaker leads are incompletely evaluated. Patient is rotated towards the right. Hazy densities in the right lung. Heart size is within normal limits. Negative for pneumothorax. Atherosclerotic calcifications at the aortic arch. IMPRESSION: Hazy densities in the right lung. Findings are nonspecific but could be related to mild asymmetric edema and recommend continued follow-up. Electronically Signed   By: Markus Daft M.D.   On: 05/28/2017 09:06   Vas [place] Groin Pseudoaneurysm  Result Date: 05/28/2017  ARTERIAL PSEUDOANEURYSM  Exam: Right groin History: S/p catheterization.  Examination Guidelines: A complete evaluation includes B-mode imaging, spectral doppler, color doppler, and power doppler as needed of all accessible portions of each vessel. Bilateral testing is considered an integral part of a complete examination. Limited  examinations for reoccurring indications may be performed as noted. +------------+----------+---------+------+----------+ Right DuplexPSV (cm/s)Waveform PlaqueComment(s) +------------+----------+---------+------+----------+ CFA                   triphasic                 +------------+----------+---------+------+----------+  Final Interpretation No evidence of pseudoaneurysm, AVF, or DVT in the right groin.  Harold Barban Electronically signed by Harold Barban on 05/28/2017 at 16:10:96 PM.   --------------------------------------------------------------------------------  Final   Ct Head Code Stroke Wo Contrast  Result Date: 05/27/2017 CLINICAL DATA:  Code stroke. 84 year old male with left side neurologic deficits. Last seen normal 1630 hrs. EXAM: CT HEAD WITHOUT CONTRAST TECHNIQUE: Contiguous axial images were obtained from the base of the skull through the vertex without intravenous contrast. COMPARISON:  No prior head CT. FINDINGS: Brain: Confluent hypodensity in the right inferior frontal gyrus near the  operculum appears mostly confined to the white matter. No other right MCA territory cytotoxic edema is identified. There is a cytotoxic edema in the medial and posterior left occipital lobe with no regional mass effect. Other scattered white matter hypodensity, but no other cytotoxic edema identified. No acute intracranial hemorrhage identified. No ventriculomegaly. No intracranial mass effect. There does appear to be a small area of chronic cortical encephalomalacia in the left posterior frontal lobe (sagittal image 49). Vascular: Hyperdense right MCA and right MCA bifurcation best demonstrated on coronal image 29 and sagittal image 19. Dominant appearing distal left vertebral artery. Skull: No acute osseous abnormality identified. Sinuses/Orbits: Clear. Other: No acute findings. ASPECTS [facility] Stroke Program Early CT Score) - Ganglionic level infarction (caudate, lentiform nuclei, internal capsule, insula, M1-M3 cortex): 6 (abnormal right M1 segment) - Supraganglionic infarction (M4-M6 cortex): 3 Total score (0-10 with 10 being normal): 9 IMPRESSION: 1. Positive for hyperdense right MCA. Hypodensity in the right frontal lobe appears mostly confined to the white matter. ASPECTS is 9. No hemorrhage or mass effect. 2. Subacute appearing left PCA territory infarct with no hemorrhage or mass effect. 3. These results were communicated to Dr. Cheral Marker at 5:53 pmon 2/11/2019by text page via the [facility] messaging system. Electronically Signed   By: Genevie Ann M.D.   On: 05/27/2017 17:54   TTE  - Left ventricle: The cavity size was normal. Wall thickness was   increased in a pattern of mild LVH. Systolic function was normal.   The estimated ejection fraction was in the range of 60% to 65%.   Wall motion was normal; there were no regional wall motion   abnormalities. Features are consistent with a pseudonormal left   ventricular filling pattern, with concomitant abnormal relaxation   and increased filling pressure  (grade 2 diastolic dysfunction). - Aortic valve: Trileaflet; mildly thickened, mildly calcified   leaflets. - Aorta: The aorta was mildly calcified. - Mitral valve: There was trivial regurgitation. - Left atrium: The atrium was moderately dilated. - Right ventricle: The cavity size was mildly dilated. Wall   thickness was normal. Pacer wire or catheter noted in right   ventricle. - Right atrium: The atrium was mildly dilated. - Tricuspid valve: There was mild regurgitation. Impressions: - No cardiac source of emboli was indentified.   PHYSICAL EXAM  Temp:  [97.4 F (36.3 C)-98.5 F (36.9 C)] 98 F (36.7 C) (02/13 1059) Pulse Rate:  [57-118] 59 (02/13 1200) Resp:  [13-27] 16 (02/13 1200) BP: (97-149)/(57-117) 131/90 (02/13 1200) SpO2:  [93 %-100 %] 99 % (02/13 1200) Arterial Line BP: (141-148)/(66-72) 141/66 (02/12  1500)  General - Well nourished, well developed, in no apparent distress.  Ophthalmologic - fundi not visualized due to noncooperation.  Cardiovascular - Regular rate and rhythm with pacing.  Neuro - awake alert, orientated to time, place and people. Eyes open, PERRL, follows simple commands on the right. Hard of hearing bilaterally. Right gaze and not able to cross midline, left neglect. Not blinking to visual threat on the left. Left facial droop. Tongue midline. Left facial sensation decreased. LUE 0/5 mild withdraw with pain. LLE 2/5 on painful stimulation. RUE 4/5 and RLE not able to test due to right groin hematoma. Bilateral babinski positive. DTR 1+. Sensation decreased on the left UE and LE, coordination intact right UE and gait not tested.     ASSESSMENT/PLAN [name] is a 84 y.o. male with history of A. fib s/p pacemaker not on AC due to recent ?? SDH, hypertension admitted for acute onset left-sided weakness and right gaze. No tPA given due to left PCA in the right MCA hypodensity on CT.  Status post endovascular treatment.  Stroke:  Large  right MCA, small left MCA, moderate left PCA infarct embolic secondary to A. fib not on AC  Resultant right gaze, left hemiparesis  CT head left PCA right MCA subacute infarcts  CTA head and neck right M1 cutoff  CT perfusion positive penumbra patter  DSA righ M1 TICI2b reperfusiont   MRI large right MCA, small left MCA, and moderate left PCA infarcts  MRA persistent right M2 high-grade stenosis status post endovascular treatment  2D Echo EF 60-65%  LDL 77  HgbA1c 5.4  Lovenox for VTE prophylaxis Fall precautions  DIET - DYS 1 Room service appropriate? Yes; Fluid consistency: Nectar Thick   No antithrombotic prior to admission, now on aspirin 325 mg daily. Will consider to resume eliquis in 7-10 days given large size of stroke  Patient counseled to be compliant with his antithrombotic medications  Ongoing aggressive stroke risk factor management  Therapy recommendations:  CIR  Disposition:  Pending  Traumatic SDH - absorbed  9-01/2017 had a fall, did not hit head - felt leg weakness  Stopped eliquis early 04/2017 due to cardiology concerning for "bleeding in head" - No CT head image or report to review  Followed with Dr. Bridgette Habermann at [facility] NSG and repeat CT head on 05/10/17 showed SDH completely reabsorbed and small meningioma - but recommend ASA instead of AC  Atrial fibrillation  Intermittent atrial fibrillation status post pacemaker  Was on Eliquis  Eliquis stopped early 04/2017  Currently rate controlled with atenolol  On ASA for now  will resume Eliquis in 7-10 days due to large size of infarct  Hypertension Stable BP goal less than 160 status post procedure On atenolol daily and Lasix as needed  Long term BP goal normotensive  Hyperlipidemia  Home meds: Zocor  LDL 77, goal < 70  Now on Zocor 40 resumed  Continue statin at discharge  Other Stroke Risk Factors  Advanced age  Other Active Problems  Right groin bleeding -ultrasound no  pseudoaneurysm  Left frontoparietal small meningioma  Hospital day # 2  This patient is critically ill due to embolic stroke, large right MCA stroke, A. fib not on anticoagulation, questionable history of the SDH and at significant risk of neurological worsening, death form recurrent stroke, hemorrhagic conversion, heart failure, brain bleed. This patient's care requires constant monitoring of vital signs, hemodynamics, respiratory and cardiac monitoring, review of multiple databases, neurological assessment, discussion with  family, other specialists and medical decision making of high complexity. I had long discussion with daughter and wife at bedside, updated pt current condition, treatment plan and potential prognosis. They expressed understanding and appreciation.  I spent 35 minutes of neurocritical care time in the care of this patient.   Rosalin Hawking, MD PhD Stroke Neurology 05/29/2017 12:31 PM    To contact Stroke Continuity provider, please refer to [email or web address] After hours, contact General Neurology

## 2017-05-29 NOTE — Progress Notes (Signed)
Modified Barium Swallow Progress Note  Patient Details  Name: Derek Hays MRN: [id number] Date of Birth: Oct 13, 1930  Today's Date: 05/29/2017  Modified Barium Swallow completed.  Full report located under Chart Review in the Imaging Section.  Brief recommendations include the following:  Clinical Impression  Pt demonstrates a moderate oral dysphagia with left lingual and lingual weakness with anterior spillage and pooling, slow mastication and bolus formation with left buccal residuals. A straw is beneficial for oral containment with liquids. There is a delay in swallow initiation intermittently, though even in best timing, there is slightly incomplete laryngeal closure. At best there is flash penetration of nectar thick liquids and at worst, silent aspiration of thin liquids. A chin tuck with a straw with thin liquids consistently eliminated aspiration. Recommend pt initiate a dys 1 (puree) diet with nectar thick liquids with trials of thin liquids with a chin tuck in further therapy sessions. Pills may be given whole in puree. Will f/u for further tolerance and interventions.    Swallow Evaluation Recommendations       SLP Diet Recommendations: Dysphagia 1 (Puree) solids;Nectar thick liquid   Liquid Administration via: Straw   Medication Administration: Whole meds with puree   Supervision: Staff to assist with self feeding   Compensations: Slow rate;Small sips/bites;Lingual sweep for clearance of pocketing;Monitor for anterior loss;Clear throat intermittently   Postural Changes: Remain semi-upright after after feeds/meals (Comment);Seated upright at 90 degrees   Oral Care Recommendations: Oral care BID   Other Recommendations: Have oral suction available   Herbie Baltimore, MA CCC-SLP [phone]  Lynann Beaver 05/29/2017,1:27 PM

## 2017-05-29 NOTE — Progress Notes (Signed)
Pt arrived from 4N. VSS

## 2017-05-29 NOTE — Progress Notes (Signed)
Rehab admissions - I met with patient's wife at the bedside.  Patient is currently sleeping.  I gave wife rehab booklets and explained about rehab options.  I will follow up in am again.  Call me for questions.  #[phone]

## 2017-05-29 NOTE — Care Management Note (Signed)
Case Management Note  Patient Details  Name: Derek Hays MRN: [id number] Date of Birth: [birth year]  Subjective/Objective:   84 yo admitted with left weakness and difficulty speaking with R MCA and L PCA infarcts s/p revascularization with R groin hematoma.  PTA, pt independent with assistive devices; lives at home with spouse.                   Action/Plan:   PT/OT recommending CIR, and consult in process.  Will follow for discharge planning as pt progresses.      Expected Discharge Date:                  Expected Discharge Plan:  [place]  In-House Referral:     Discharge planning Services  CM Consult  Post Acute Care Choice:    Choice offered to:     DME Arranged:    DME Agency:     HH Arranged:    [facility] Agency:     Status of Service:  In process, will continue to follow  If discussed at Long Length of Stay Meetings, dates discussed:    Additional Comments:  Reinaldo Raddle, RN, BSN  Trauma/Neuro ICU Case Manager [phone]

## 2017-05-29 NOTE — Evaluation (Signed)
Clinical/Bedside Swallow Evaluation Patient Details  Name: Derek Hays MRN: [id number] Date of Birth: 1930/09/11  Today's Date: 05/29/2017 Time: SLP Start Time (ACUTE ONLY): 84 SLP Stop Time (ACUTE ONLY): 0950 SLP Time Calculation (min) (ACUTE ONLY): 35 min  Past Medical History:  Past Medical History:  Diagnosis Date  . Atrial fibrillation ([place])   . Hypertension    Past Surgical History:  Past Surgical History:  Procedure Laterality Date  . IR CT HEAD LTD  05/27/2017  . IR PERCUTANEOUS ART THROMBECTOMY/INFUSION INTRACRANIAL INC DIAG ANGIO  05/27/2017  . RADIOLOGY WITH ANESTHESIA N/A 05/27/2017   Procedure: RADIOLOGY WITH ANESTHESIA;  Surgeon: Luanne Bras, MD;  Location: [facility];  Service: Radiology;  Laterality: N/A;   HPI:  Mr.Derek C Harrellis a 84 y.o.malewith history of A. fibs/ppacemaker not on AC due to recent?? SDH, hypertensionadmitted foracute onset left-sided weaknessand right gaze. No tPA given due toleftPCA in the right MCA hypodensity on CT.Status post endovascular treatment. MRI shows Acute right MCA infarct involving the right insula and frontal parietal lobe. Small acute or subacute infarct left parietal lobe. Subacute infarct left occipital lobe. Findings suggest emboli.   Assessment / Plan / Recommendation Clinical Impression  Pt demonstrates oral dysphagia with anterior spillage and left sulci pocketing without awareness. Straw sips and verbal cues improved awareness of deficits. When allowed to drink independently and freely pt does not consistnetly initiation swallow with purees, and has intermittent wet vocal quailty and delayed cough with water. Recommend objective testing to determine severity of dysphagia and best diet/strategies.  SLP Visit Diagnosis: Dysphagia, oropharyngeal phase (R13.12)    Aspiration Risk  Moderate aspiration risk    Diet Recommendation NPO except meds   Medication Administration: Crushed with puree    Other   Recommendations     Follow up Recommendations        Frequency and Duration            Prognosis        Swallow Study   General HPI: Mr.Derek C Harrellis a 84 y.o.malewith history of A. fibs/ppacemaker not on AC due to recent?? SDH, hypertensionadmitted foracute onset left-sided weaknessand right gaze. No tPA given due toleftPCA in the right MCA hypodensity on CT.Status post endovascular treatment. MRI shows Acute right MCA infarct involving the right insula and frontal parietal lobe. Small acute or subacute infarct left parietal lobe. Subacute infarct left occipital lobe. Findings suggest emboli. Type of Study: Bedside Swallow Evaluation Previous Swallow Assessment: none Diet Prior to this Study: NPO Temperature Spikes Noted: No Respiratory Status: Room air History of Recent Intubation: No Behavior/Cognition: Alert;Cooperative;Pleasant mood Oral Cavity Assessment: Within Functional Limits Oral Care Completed by SLP: No Oral Cavity - Dentition: Adequate natural dentition Vision: Impaired for self-feeding Self-Feeding Abilities: Needs assist Patient Positioning: Upright in chair Baseline Vocal Quality: Normal Volitional Cough: Strong Volitional Swallow: Able to elicit    Oral/Motor/Sensory Function Overall Oral Motor/Sensory Function: Moderate impairment Facial ROM: Reduced left;Suspected CN VII (facial) dysfunction Facial Symmetry: Abnormal symmetry left;Suspected CN VII (facial) dysfunction Facial Strength: Reduced left;Suspected CN VII (facial) dysfunction Facial Sensation: Reduced left;Suspected CN V (Trigeminal) dysfunction Lingual ROM: Reduced left;Suspected CN XII (hypoglossal) dysfunction Lingual Symmetry: Within Functional Limits Lingual Strength: Reduced;Suspected CN XII (hypoglossal) dysfunction Velum: Within Functional Limits Mandible: Within Functional Limits   Ice Chips Ice chips: Not tested   Thin Liquid Thin Liquid: Impaired Presentation:  Cup;Straw;Self Fed Oral Phase Impairments: Reduced labial seal Oral Phase Functional Implications: Left anterior spillage;Prolonged oral transit Pharyngeal  Phase  Impairments: Suspected delayed Swallow;Wet Vocal Quality;Cough - Delayed;Throat Clearing - Immediate    Nectar Thick Nectar Thick Liquid: Not tested   Honey Thick Honey Thick Liquid: Not tested   Puree Puree: Impaired Presentation: Self Fed;Spoon Oral Phase Impairments: Reduced labial seal Oral Phase Functional Implications: Prolonged oral transit;Oral holding;Left anterior spillage;Left lateral sulci pocketing Pharyngeal Phase Impairments: Suspected delayed Swallow;Wet Vocal Quality   Solid   GO   Solid: Impaired Presentation: Self Fed Oral Phase Impairments: Impaired mastication;Reduced labial seal;Reduced lingual movement/coordination Oral Phase Functional Implications: Left anterior spillage;Left lateral sulci pocketing;Prolonged oral transit        Maxxon Schwanke, [employer] 05/29/2017,11:27 AM

## 2017-05-30 ENCOUNTER — Other Ambulatory Visit: Payer: Self-pay

## 2017-05-30 ENCOUNTER — Inpatient Hospital Stay (HOSPITAL_COMMUNITY)
Admission: RE | Admit: 2017-05-30 | Discharge: 2017-06-20 | DRG: 057 | Disposition: A | Discharge status: Skilled Nursing Facility | Payer: Medicare Other | Source: Intra-hospital | Attending: Physical Medicine & Rehabilitation | Admitting: Physical Medicine & Rehabilitation

## 2017-05-30 DIAGNOSIS — I639 Cerebral infarction, unspecified: Secondary | ICD-10-CM | POA: Diagnosis not present

## 2017-05-30 DIAGNOSIS — I69328 Other speech and language deficits following cerebral infarction: Secondary | ICD-10-CM | POA: Diagnosis not present

## 2017-05-30 DIAGNOSIS — I4891 Unspecified atrial fibrillation: Secondary | ICD-10-CM | POA: Diagnosis present

## 2017-05-30 DIAGNOSIS — E785 Hyperlipidemia, unspecified: Secondary | ICD-10-CM | POA: Diagnosis present

## 2017-05-30 DIAGNOSIS — I10 Essential (primary) hypertension: Secondary | ICD-10-CM | POA: Diagnosis not present

## 2017-05-30 DIAGNOSIS — R1310 Dysphagia, unspecified: Secondary | ICD-10-CM | POA: Diagnosis present

## 2017-05-30 DIAGNOSIS — R209 Unspecified disturbances of skin sensation: Secondary | ICD-10-CM | POA: Diagnosis not present

## 2017-05-30 DIAGNOSIS — E039 Hypothyroidism, unspecified: Secondary | ICD-10-CM | POA: Diagnosis present

## 2017-05-30 DIAGNOSIS — N390 Urinary tract infection, site not specified: Secondary | ICD-10-CM | POA: Diagnosis not present

## 2017-05-30 DIAGNOSIS — Z7901 Long term (current) use of anticoagulants: Secondary | ICD-10-CM | POA: Diagnosis not present

## 2017-05-30 DIAGNOSIS — N182 Chronic kidney disease, stage 2 (mild): Secondary | ICD-10-CM | POA: Diagnosis present

## 2017-05-30 DIAGNOSIS — I69391 Dysphagia following cerebral infarction: Secondary | ICD-10-CM

## 2017-05-30 DIAGNOSIS — R414 Neurologic neglect syndrome: Secondary | ICD-10-CM

## 2017-05-30 DIAGNOSIS — I69321 Dysphasia following cerebral infarction: Secondary | ICD-10-CM | POA: Diagnosis not present

## 2017-05-30 DIAGNOSIS — N318 Other neuromuscular dysfunction of bladder: Secondary | ICD-10-CM | POA: Diagnosis present

## 2017-05-30 DIAGNOSIS — H04129 Dry eye syndrome of unspecified lacrimal gland: Secondary | ICD-10-CM | POA: Diagnosis not present

## 2017-05-30 DIAGNOSIS — R278 Other lack of coordination: Secondary | ICD-10-CM | POA: Diagnosis not present

## 2017-05-30 DIAGNOSIS — M791 Myalgia, unspecified site: Secondary | ICD-10-CM | POA: Diagnosis present

## 2017-05-30 DIAGNOSIS — I63511 Cerebral infarction due to unspecified occlusion or stenosis of right middle cerebral artery: Secondary | ICD-10-CM | POA: Diagnosis not present

## 2017-05-30 DIAGNOSIS — G8114 Spastic hemiplegia affecting left nondominant side: Secondary | ICD-10-CM | POA: Diagnosis not present

## 2017-05-30 DIAGNOSIS — R4182 Altered mental status, unspecified: Secondary | ICD-10-CM | POA: Diagnosis not present

## 2017-05-30 DIAGNOSIS — I69354 Hemiplegia and hemiparesis following cerebral infarction affecting left non-dominant side: Secondary | ICD-10-CM | POA: Diagnosis not present

## 2017-05-30 DIAGNOSIS — I69398 Other sequelae of cerebral infarction: Secondary | ICD-10-CM

## 2017-05-30 DIAGNOSIS — B9689 Other specified bacterial agents as the cause of diseases classified elsewhere: Secondary | ICD-10-CM | POA: Diagnosis not present

## 2017-05-30 DIAGNOSIS — N183 Chronic kidney disease, stage 3 unspecified: Secondary | ICD-10-CM

## 2017-05-30 DIAGNOSIS — R339 Retention of urine, unspecified: Secondary | ICD-10-CM

## 2017-05-30 DIAGNOSIS — I69319 Unspecified symptoms and signs involving cognitive functions following cerebral infarction: Secondary | ICD-10-CM | POA: Diagnosis not present

## 2017-05-30 DIAGNOSIS — I69318 Other symptoms and signs involving cognitive functions following cerebral infarction: Secondary | ICD-10-CM | POA: Diagnosis not present

## 2017-05-30 DIAGNOSIS — H3530 Unspecified macular degeneration: Secondary | ICD-10-CM | POA: Diagnosis not present

## 2017-05-30 DIAGNOSIS — M6281 Muscle weakness (generalized): Secondary | ICD-10-CM | POA: Diagnosis not present

## 2017-05-30 DIAGNOSIS — D62 Acute posthemorrhagic anemia: Secondary | ICD-10-CM | POA: Diagnosis not present

## 2017-05-30 DIAGNOSIS — Z87891 Personal history of nicotine dependence: Secondary | ICD-10-CM | POA: Diagnosis not present

## 2017-05-30 DIAGNOSIS — G464 Cerebellar stroke syndrome: Secondary | ICD-10-CM | POA: Diagnosis not present

## 2017-05-30 DIAGNOSIS — I129 Hypertensive chronic kidney disease with stage 1 through stage 4 chronic kidney disease, or unspecified chronic kidney disease: Secondary | ICD-10-CM | POA: Diagnosis present

## 2017-05-30 DIAGNOSIS — Z950 Presence of cardiac pacemaker: Secondary | ICD-10-CM | POA: Diagnosis not present

## 2017-05-30 DIAGNOSIS — I480 Paroxysmal atrial fibrillation: Secondary | ICD-10-CM

## 2017-05-30 DIAGNOSIS — I69822 Dysarthria following other cerebrovascular disease: Secondary | ICD-10-CM | POA: Diagnosis not present

## 2017-05-30 DIAGNOSIS — K5900 Constipation, unspecified: Secondary | ICD-10-CM | POA: Diagnosis present

## 2017-05-30 DIAGNOSIS — I482 Chronic atrial fibrillation: Secondary | ICD-10-CM

## 2017-05-30 DIAGNOSIS — R1312 Dysphagia, oropharyngeal phase: Secondary | ICD-10-CM | POA: Diagnosis not present

## 2017-05-30 DIAGNOSIS — K5901 Slow transit constipation: Secondary | ICD-10-CM | POA: Diagnosis not present

## 2017-05-30 DIAGNOSIS — H409 Unspecified glaucoma: Secondary | ICD-10-CM | POA: Diagnosis not present

## 2017-05-30 DIAGNOSIS — I69315 Cognitive social or emotional deficit following cerebral infarction: Secondary | ICD-10-CM | POA: Diagnosis not present

## 2017-05-30 DIAGNOSIS — R293 Abnormal posture: Secondary | ICD-10-CM | POA: Diagnosis not present

## 2017-05-30 HISTORY — DX: Malignant neoplasm of bladder, unspecified: C67.9

## 2017-05-30 LAB — BASIC METABOLIC PANEL
ANION GAP: 10 (ref 5–15)
BUN: 23 mg/dL — AB (ref 6–20)
CHLORIDE: 109 mmol/L (ref 101–111)
CO2: 20 mmol/L — ABNORMAL LOW (ref 22–32)
Calcium: 8.6 mg/dL — ABNORMAL LOW (ref 8.9–10.3)
Creatinine, Ser: 1.25 mg/dL — ABNORMAL HIGH (ref 0.61–1.24)
GFR calc Af Amer: 59 mL/min — ABNORMAL LOW (ref >60)
GFR calc non Af Amer: 51 mL/min — ABNORMAL LOW (ref >60)
Glucose, Bld: 133 mg/dL — ABNORMAL HIGH (ref 65–99)
POTASSIUM: 4 mmol/L (ref 3.5–5.1)
SODIUM: 139 mmol/L (ref 135–145)

## 2017-05-30 LAB — CBC
HCT: 35.4 % — ABNORMAL LOW (ref 39.0–52.0)
HEMOGLOBIN: 11.9 g/dL — AB (ref 13.0–17.0)
MCH: 30.4 pg (ref 26.0–34.0)
MCHC: 33.6 g/dL (ref 30.0–36.0)
MCV: 90.3 fL (ref 78.0–100.0)
Platelets: 199 10*3/uL (ref 150–400)
RBC: 3.92 MIL/uL — AB (ref 4.22–5.81)
RDW: 13.7 % (ref 11.5–15.5)
WBC: 9.7 10*3/uL (ref 4.0–10.5)

## 2017-05-30 LAB — GLUCOSE, CAPILLARY: GLUCOSE-CAPILLARY: 96 mg/dL (ref 65–99)

## 2017-05-30 MED ORDER — ENOXAPARIN SODIUM 40 MG/0.4ML ~~LOC~~ SOLN
40.0000 mg | SUBCUTANEOUS | Status: DC
  Administered 2017-05-30 – 2017-06-09 (×11): 40 mg via SUBCUTANEOUS
  Filled 2017-05-30 (×11): qty 0.4

## 2017-05-30 MED ORDER — TAMSULOSIN HCL 0.4 MG PO CAPS
0.8000 mg | ORAL_CAPSULE | Freq: Once | ORAL | Status: AC
  Administered 2017-05-30: 0.8 mg via ORAL
  Filled 2017-05-30: qty 2

## 2017-05-30 MED ORDER — LATANOPROST 0.005 % OP SOLN
1.0000 [drp] | Freq: Every day | OPHTHALMIC | Status: DC
  Administered 2017-05-30 – 2017-06-19 (×21): 1 [drp] via OPHTHALMIC
  Filled 2017-05-30 (×2): qty 2.5

## 2017-05-30 MED ORDER — ENOXAPARIN SODIUM 40 MG/0.4ML ~~LOC~~ SOLN: 40.0000 mg | SUBCUTANEOUS | Status: DC

## 2017-05-30 MED ORDER — PREGABALIN 50 MG PO CAPS
100.0000 mg | ORAL_CAPSULE | Freq: Two times a day (BID) | ORAL | Status: DC
  Administered 2017-05-30 – 2017-06-20 (×42): 100 mg via ORAL
  Filled 2017-05-30 (×42): qty 2

## 2017-05-30 MED ORDER — ACETAMINOPHEN 325 MG PO TABS
650.0000 mg | ORAL_TABLET | ORAL | Status: DC | PRN
  Administered 2017-06-03 – 2017-06-12 (×5): 650 mg via ORAL
  Filled 2017-05-30 (×6): qty 2

## 2017-05-30 MED ORDER — POLYVINYL ALCOHOL 1.4 % OP SOLN: 1.0000 [drp] | Freq: Four times a day (QID) | OPHTHALMIC | Status: DC | PRN

## 2017-05-30 MED ORDER — TAMSULOSIN HCL 0.4 MG PO CAPS: 0.4000 mg | ORAL_CAPSULE | Freq: Every day | ORAL | Status: DC

## 2017-05-30 MED ORDER — FUROSEMIDE 20 MG PO TABS
20.0000 mg | ORAL_TABLET | Freq: Every day | ORAL | Status: DC | PRN
  Administered 2017-06-11: 20 mg via ORAL
  Filled 2017-05-30: qty 1

## 2017-05-30 MED ORDER — ONDANSETRON HCL 4 MG/2ML IJ SOLN: 4.0000 mg | Freq: Four times a day (QID) | INTRAMUSCULAR | Status: DC | PRN

## 2017-05-30 MED ORDER — ASPIRIN 300 MG RE SUPP: 300.0000 mg | Freq: Every day | RECTAL | Status: DC

## 2017-05-30 MED ORDER — RESOURCE THICKENUP CLEAR PO POWD
ORAL | Status: DC | PRN
  Filled 2017-05-30: qty 125

## 2017-05-30 MED ORDER — SENNOSIDES-DOCUSATE SODIUM 8.6-50 MG PO TABS
1.0000 | ORAL_TABLET | Freq: Every evening | ORAL | Status: DC | PRN
  Administered 2017-05-30: 1 via ORAL
  Filled 2017-05-30: qty 1

## 2017-05-30 MED ORDER — ASPIRIN EC 325 MG PO TBEC
325.0000 mg | DELAYED_RELEASE_TABLET | Freq: Every day | ORAL | Status: DC
  Administered 2017-05-31 – 2017-06-01 (×2): 325 mg via ORAL
  Filled 2017-05-30 (×3): qty 1

## 2017-05-30 MED ORDER — TAMSULOSIN HCL 0.4 MG PO CAPS
0.4000 mg | ORAL_CAPSULE | Freq: Every day | ORAL | Status: DC
  Administered 2017-05-30 – 2017-06-19 (×21): 0.4 mg via ORAL
  Filled 2017-05-30 (×21): qty 1

## 2017-05-30 MED ORDER — SIMVASTATIN 40 MG PO TABS
40.0000 mg | ORAL_TABLET | Freq: Every day | ORAL | Status: DC
  Administered 2017-05-30 – 2017-06-19 (×21): 40 mg via ORAL
  Filled 2017-05-30 (×21): qty 1

## 2017-05-30 MED ORDER — ATENOLOL 50 MG PO TABS
50.0000 mg | ORAL_TABLET | Freq: Every day | ORAL | Status: DC
  Administered 2017-05-31 – 2017-06-16 (×16): 50 mg via ORAL
  Filled 2017-05-30 (×18): qty 1

## 2017-05-30 MED ORDER — TIMOLOL MALEATE 0.5 % OP SOLG
1.0000 [drp] | Freq: Every morning | OPHTHALMIC | Status: DC
  Administered 2017-05-31 – 2017-06-20 (×20): 1 [drp] via OPHTHALMIC
  Filled 2017-05-30 (×2): qty 5

## 2017-05-30 MED ORDER — ACETAMINOPHEN 650 MG RE SUPP: 650.0000 mg | RECTAL | Status: DC | PRN

## 2017-05-30 MED ORDER — SORBITOL 70 % SOLN
30.0000 mL | Freq: Every day | Status: DC | PRN
  Administered 2017-06-01 – 2017-06-05 (×2): 30 mL via ORAL
  Filled 2017-05-30 (×2): qty 30

## 2017-05-30 MED ORDER — ONDANSETRON HCL 4 MG PO TABS: 4.0000 mg | ORAL_TABLET | Freq: Four times a day (QID) | ORAL | Status: DC | PRN

## 2017-05-30 MED ORDER — LEVOTHYROXINE SODIUM 112 MCG PO TABS
112.0000 ug | ORAL_TABLET | Freq: Every day | ORAL | Status: DC
  Administered 2017-05-31 – 2017-06-20 (×21): 112 ug via ORAL
  Filled 2017-05-30 (×21): qty 1

## 2017-05-30 MED ORDER — ACETAMINOPHEN 160 MG/5ML PO SOLN
650.0000 mg | ORAL | Status: DC | PRN
  Administered 2017-06-11: 650 mg
  Filled 2017-05-30: qty 20.3

## 2017-05-30 NOTE — Progress Notes (Signed)
Transferred to CIR by staff. Family at bedside. No new concerns.

## 2017-05-30 NOTE — Discharge Summary (Signed)
Stroke Discharge Summary  Patient ID: Derek Hays   MRN: [id number]      DOB: 05/22/1940  Date of Admission: 05/27/2017 Date of Discharge: 05/30/2017  Attending Physician:  Rosalin Hawking, MD, Stroke MD Consultant(s):   rehabilitation medicine and Interventional Radiology Patient's PCP:  Patient, No Pcp Per  Discharge Diagnoses:  Active Problems:   Stroke (cerebrum) (HCC)   Middle cerebral artery embolism, right Traumatic SDH Atrial Fibrillation Hypertension Hyperlipidemia Urinary Retention Right Groin hematoma Left frontoparietal small meningioma  Past Medical History:  Diagnosis Date  . Atrial fibrillation ([place])   . Hypertension    Past Surgical History:  Procedure Laterality Date  . IR CT HEAD LTD  05/27/2017  . IR PERCUTANEOUS ART THROMBECTOMY/INFUSION INTRACRANIAL INC DIAG ANGIO  05/27/2017  . RADIOLOGY WITH ANESTHESIA N/A 05/27/2017   Procedure: RADIOLOGY WITH ANESTHESIA;  Surgeon: Luanne Bras, MD;  Location: [facility];  Service: Radiology;  Laterality: N/A;    Medications to be continued on Rehab .  stroke: mapping our early stages of recovery book   Does not apply Once  . aspirin EC  325 mg Oral Daily   Or  . aspirin  300 mg Rectal Daily  . atenolol  50 mg Oral Daily  . chlorhexidine  15 mL Mouth Rinse BID  . enoxaparin (LOVENOX) injection  40 mg Subcutaneous Q24H  . latanoprost  1 drop Both Eyes QHS  . levothyroxine  112 mcg Oral QAC breakfast  . pregabalin  100 mg Oral BID  . simvastatin  40 mg Oral QHS  . tamsulosin  0.4 mg Oral QPC supper  . tamsulosin  0.8 mg Oral Once  . timolol  1 drop Left Eye q morning - 10a    LABORATORY STUDIES CBC    Component Value Date/Time   WBC 9.7 05/30/2017 0239   RBC 3.92 (L) 05/30/2017 0239   HGB 11.9 (L) 05/30/2017 0239   HCT 35.4 (L) 05/30/2017 0239   PLT 199 05/30/2017 0239   MCV 90.3 05/30/2017 0239   MCH 30.4 05/30/2017 0239   MCHC 33.6 05/30/2017 0239   RDW 13.7 05/30/2017 0239   LYMPHSABS 0.6 (L)  05/28/2017 0611   MONOABS 0.2 05/28/2017 0611   EOSABS 0.0 05/28/2017 0611   BASOSABS 0.0 05/28/2017 0611   CMP    Component Value Date/Time   NA 139 05/30/2017 0239   K 4.0 05/30/2017 0239   CL 109 05/30/2017 0239   CO2 20 (L) 05/30/2017 0239   GLUCOSE 133 (H) 05/30/2017 0239   BUN 23 (H) 05/30/2017 0239   CREATININE 1.25 (H) 05/30/2017 0239   CALCIUM 8.6 (L) 05/30/2017 0239   PROT 6.9 05/27/2017 1730   ALBUMIN 3.9 05/27/2017 1730   AST 22 05/27/2017 1730   ALT 10 (L) 05/27/2017 1730   ALKPHOS 76 05/27/2017 1730   BILITOT 1.1 05/27/2017 1730   GFRNONAA 51 (L) 05/30/2017 0239   GFRAA 59 (L) 05/30/2017 0239   COAGS Lab Results  Component Value Date   INR 1.01 05/27/2017   Lipid Panel    Component Value Date/Time   CHOL 133 05/28/2017 0611   TRIG 66 05/28/2017 0611   HDL 43 05/28/2017 0611   CHOLHDL 3.1 05/28/2017 0611   VLDL 13 05/28/2017 0611   LDLCALC 77 05/28/2017 0611   HgbA1C  Lab Results  Component Value Date   HGBA1C 5.4 05/28/2017   Urinalysis No results found for: COLORURINE, APPEARANCEUR, LABSPEC, PHURINE, GLUCOSEU, HGBUR, BILIRUBINUR, KETONESUR, PROTEINUR, UROBILINOGEN, NITRITE,  LEUKOCYTESUR Urine Drug Screen No results found for: LABOPIA, COCAINSCRNUR, LABBENZ, AMPHETMU, THCU, LABBARB  Alcohol Level No results found for: [facility]   SIGNIFICANT DIAGNOSTIC STUDIES Ct Angio Head W Or Wo Contrast  Result Date: 05/27/2017 CLINICAL DATA:  Code stroke.  Left-sided deficits. EXAM: CT ANGIOGRAPHY HEAD AND NECK CT PERFUSION BRAIN TECHNIQUE: Multidetector CT imaging of the head and neck was performed using the standard protocol during bolus administration of intravenous contrast. Multiplanar CT image reconstructions and MIPs were obtained to evaluate the vascular anatomy. Carotid stenosis measurements (when applicable) are obtained utilizing NASCET criteria, using the distal internal carotid diameter as the denominator. Multiphase CT imaging of the brain was  performed following IV bolus contrast injection. Subsequent parametric perfusion maps were calculated using RAPID software. CONTRAST:  90 mL Isovue 370 COMPARISON:  Head CT 05/27/2017 FINDINGS: CTA NECK FINDINGS Aortic arch: There is moderate calcific atherosclerosis of the aortic arch. There is no aneurysm, dissection or hemodynamically significant stenosis of the visualized ascending aorta and aortic arch. Conventional 3 vessel aortic branching pattern. The visualized proximal subclavian arteries are normal. Right carotid system: The right common carotid origin is widely patent. There is no common carotid or internal carotid artery dissection or aneurysm. Atherosclerotic calcification at the carotid bifurcation without hemodynamically significant stenosis. Left carotid system: The left common carotid origin is widely patent. There is no common carotid or internal carotid artery dissection or aneurysm. Atherosclerotic calcification at the carotid bifurcation without hemodynamically significant stenosis. Vertebral arteries: The vertebral system is left dominant. Both vertebral artery origins are normal. Both vertebral arteries are normal to their confluence with the basilar artery. Skeleton: Multilevel cervical facet arthrosis. No bony spinal canal stenosis. No skull lesion. Other neck: The nasopharynx is clear. The oropharynx and hypopharynx are normal. The epiglottis is normal. The supraglottic larynx, glottis and subglottic larynx are normal. No retropharyngeal collection. The parapharyngeal spaces are preserved. The parotid and submandibular glands are normal. No sialolithiasis or salivary ductal dilatation. The thyroid gland is normal. There is no cervical lymphadenopathy. Upper chest: Incompletely visualized right pleural effusion. Review of the MIP images confirms the above findings CTA HEAD FINDINGS Anterior circulation: --Intracranial internal carotid arteries: Normal. --Anterior cerebral arteries: The  right middle cerebral artery M 1 segment is occluded at its distal aspect. There is very poor collateralization in the right MCA territory. The left middle cerebral artery is normal. --Middle cerebral arteries: Normal. --Posterior communicating arteries: Absent bilaterally. Posterior circulation: --Posterior cerebral arteries: Normal. --Superior cerebellar arteries: Normal. --Basilar artery: Normal. --Anterior inferior cerebellar arteries: Normal. --Posterior inferior cerebellar arteries: Normal. Venous sinuses: As permitted by contrast timing, patent. Anatomic variants: None Delayed phase: Not performed. 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 Review of the MIP images confirms the above findings. CT Brain Perfusion Findings: CBF (<30%) Volume: 78mL Perfusion (Tmax>6.0s) volume: 167mL Mismatch Volume: 162mL Infarction Location:Right MCA territory IMPRESSION: 1. Emergent large vessel occlusion of the distal M1 segment of the right middle cerebral artery. Very poor collateralization throughout the right MCA territory. 2. Right MCA territory infarct of 52 mL with 108 mL ischemic penumbra. 3. No midline shift or other mass effect.  No acute hemorrhage. 4. Hila convexity meningioma. 5. Carotid and aortic atherosclerosis (ICD10-I70.0) without hemodynamically significant stenosis of the carotid or vertebral arteries. These results were communicated to Dr. Kerney Elbe at 6:27 pm on 05/27/2017 by text page via the [facility] messaging system. I am currently awaiting confirmatory telephone call. Electronically Signed   By: Cletus Gash.D.  On: 05/27/2017 18:33   Ct Angio Neck W Or Wo Contrast  Result Date: 05/27/2017 CLINICAL DATA:  Code stroke.  Left-sided deficits. EXAM: CT ANGIOGRAPHY HEAD AND NECK CT PERFUSION BRAIN TECHNIQUE: Multidetector CT imaging of the head and neck was performed using the standard protocol during bolus administration of intravenous contrast. Multiplanar CT image reconstructions and  MIPs were obtained to evaluate the vascular anatomy. Carotid stenosis measurements (when applicable) are obtained utilizing NASCET criteria, using the distal internal carotid diameter as the denominator. Multiphase CT imaging of the brain was performed following IV bolus contrast injection. Subsequent parametric perfusion maps were calculated using RAPID software. CONTRAST:  90 mL Isovue 370 COMPARISON:  Head CT 05/27/2017 FINDINGS: CTA NECK FINDINGS Aortic arch: There is moderate calcific atherosclerosis of the aortic arch. There is no aneurysm, dissection or hemodynamically significant stenosis of the visualized ascending aorta and aortic arch. Conventional 3 vessel aortic branching pattern. The visualized proximal subclavian arteries are normal. Right carotid system: The right common carotid origin is widely patent. There is no common carotid or internal carotid artery dissection or aneurysm. Atherosclerotic calcification at the carotid bifurcation without hemodynamically significant stenosis. Left carotid system: The left common carotid origin is widely patent. There is no common carotid or internal carotid artery dissection or aneurysm. Atherosclerotic calcification at the carotid bifurcation without hemodynamically significant stenosis. Vertebral arteries: The vertebral system is left dominant. Both vertebral artery origins are normal. Both vertebral arteries are normal to their confluence with the basilar artery. Skeleton: Multilevel cervical facet arthrosis. No bony spinal canal stenosis. No skull lesion. Other neck: The nasopharynx is clear. The oropharynx and hypopharynx are normal. The epiglottis is normal. The supraglottic larynx, glottis and subglottic larynx are normal. No retropharyngeal collection. The parapharyngeal spaces are preserved. The parotid and submandibular glands are normal. No sialolithiasis or salivary ductal dilatation. The thyroid gland is normal. There is no cervical lymphadenopathy.  Upper chest: Incompletely visualized right pleural effusion. Review of the MIP images confirms the above findings CTA HEAD FINDINGS Anterior circulation: --Intracranial internal carotid arteries: Normal. --Anterior cerebral arteries: The right middle cerebral artery M 1 segment is occluded at its distal aspect. There is very poor collateralization in the right MCA territory. The left middle cerebral artery is normal. --Middle cerebral arteries: Normal. --Posterior communicating arteries: Absent bilaterally. Posterior circulation: --Posterior cerebral arteries: Normal. --Superior cerebellar arteries: Normal. --Basilar artery: Normal. --Anterior inferior cerebellar arteries: Normal. --Posterior inferior cerebellar arteries: Normal. Venous sinuses: As permitted by contrast timing, patent. Anatomic variants: None Delayed phase: Not performed. 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 Review of the MIP images confirms the above findings. CT Brain Perfusion Findings: CBF (<30%) Volume: 42mL Perfusion (Tmax>6.0s) volume: 137mL Mismatch Volume: 126mL Infarction Location:Right MCA territory IMPRESSION: 1. Emergent large vessel occlusion of the distal M1 segment of the right middle cerebral artery. Very poor collateralization throughout the right MCA territory. 2. Right MCA territory infarct of 52 mL with 108 mL ischemic penumbra. 3. No midline shift or other mass effect.  No acute hemorrhage. 4. Hila convexity meningioma. 5. Carotid and aortic atherosclerosis (ICD10-I70.0) without hemodynamically significant stenosis of the carotid or vertebral arteries. These results were communicated to Dr. Kerney Elbe at 6:27 pm on 05/27/2017 by text page via the [facility] messaging system. I am currently awaiting confirmatory telephone call. Electronically Signed   By: Ulyses Jarred M.D.   On: 05/27/2017 18:33   Mr Jodene Nam Head Wo Contrast  Result Date: 05/28/2017 CLINICAL DATA:  Stroke.  Endovascular  clot retrieval 05/27/2017 EXAM:  MRI HEAD WITHOUT CONTRAST MRA HEAD WITHOUT CONTRAST TECHNIQUE: Multiplanar, multiecho pulse sequences of the brain and surrounding structures were obtained without intravenous contrast. Angiographic images of the head were obtained using MRA technique without contrast. COMPARISON:  CTA 05/27/2017 FINDINGS: MRI HEAD FINDINGS Brain: Right MCA infarct with restricted diffusion in the right insula and right parietal lobe. Smaller area of acute infarct in the right frontal lobe. Small areas of acute infarct in the high right parietal lobe. No associated hemorrhage Restricted diffusion in the left parietal cortex compatible with a small area of acute infarct. Restricted diffusion in the left occipital lobe correlating to hypodensity on CT. This is compatible with subacute infarct. Negative for hemorrhage. 1 cm extra-axial mass left parietal convexity compatible with meningioma. No mass-effect Mild atrophy.  Negative for hydrocephalus. Vascular: Normal arterial flow voids Skull and upper cervical spine: Negative Sinuses/Orbits: Sinuses clear.  Bilateral cataract removal. Other: None MRA HEAD FINDINGS Image quality degraded by motion.  Is Both vertebral arteries patent to the basilar. Basilar widely patent. PICA patent bilaterally. Posterior cerebral arteries patent bilaterally. Right internal carotid artery widely patent. Right anterior cerebral artery widely patent. Mild to moderate stenosis distal right middle cerebral artery. Moderate to severe stenosis proximal right M2 branch. This vessel was thrombosed previously and appears patent with a significant stenosis which may be due to underlying atherosclerotic disease. Left internal carotid artery patent. Left anterior and middle cerebral arteries patent bilaterally. Negative for aneurysm IMPRESSION: Acute right MCA infarct involving the right insula and frontal parietal lobe. Negative for hemorrhage. Small acute or subacute infarct left parietal lobe. Subacute infarct  left occipital lobe. Findings suggest emboli. Status post revascularization of right middle cerebral artery occlusion. There remains moderate to severe stenosis proximal right M2 segment which may be due to underlying atherosclerotic disease. Electronically Signed   By: Franchot Gallo M.D.   On: 05/28/2017 17:24   Mr Brain Wo Contrast  Result Date: 05/28/2017 CLINICAL DATA:  Stroke.  Endovascular clot retrieval 05/27/2017 EXAM: MRI HEAD WITHOUT CONTRAST MRA HEAD WITHOUT CONTRAST TECHNIQUE: Multiplanar, multiecho pulse sequences of the brain and surrounding structures were obtained without intravenous contrast. Angiographic images of the head were obtained using MRA technique without contrast. COMPARISON:  CTA 05/27/2017 FINDINGS: MRI HEAD FINDINGS Brain: Right MCA infarct with restricted diffusion in the right insula and right parietal lobe. Smaller area of acute infarct in the right frontal lobe. Small areas of acute infarct in the high right parietal lobe. No associated hemorrhage Restricted diffusion in the left parietal cortex compatible with a small area of acute infarct. Restricted diffusion in the left occipital lobe correlating to hypodensity on CT. This is compatible with subacute infarct. Negative for hemorrhage. 1 cm extra-axial mass left parietal convexity compatible with meningioma. No mass-effect Mild atrophy.  Negative for hydrocephalus. Vascular: Normal arterial flow voids Skull and upper cervical spine: Negative Sinuses/Orbits: Sinuses clear.  Bilateral cataract removal. Other: None MRA HEAD FINDINGS Image quality degraded by motion.  Is Both vertebral arteries patent to the basilar. Basilar widely patent. PICA patent bilaterally. Posterior cerebral arteries patent bilaterally. Right internal carotid artery widely patent. Right anterior cerebral artery widely patent. Mild to moderate stenosis distal right middle cerebral artery. Moderate to severe stenosis proximal right M2 branch. This vessel  was thrombosed previously and appears patent with a significant stenosis which may be due to underlying atherosclerotic disease. Left internal carotid artery patent. Left anterior and middle cerebral arteries patent bilaterally. Negative for aneurysm  IMPRESSION: Acute right MCA infarct involving the right insula and frontal parietal lobe. Negative for hemorrhage. Small acute or subacute infarct left parietal lobe. Subacute infarct left occipital lobe. Findings suggest emboli. Status post revascularization of right middle cerebral artery occlusion. There remains moderate to severe stenosis proximal right M2 segment which may be due to underlying atherosclerotic disease. Electronically Signed   By: Franchot Gallo M.D.   On: 05/28/2017 17:24   Ct Cerebral Perfusion W Contrast  Result Date: 05/27/2017 CLINICAL DATA:  Code stroke.  Left-sided deficits. EXAM: CT ANGIOGRAPHY HEAD AND NECK CT PERFUSION BRAIN TECHNIQUE: Multidetector CT imaging of the head and neck was performed using the standard protocol during bolus administration of intravenous contrast. Multiplanar CT image reconstructions and MIPs were obtained to evaluate the vascular anatomy. Carotid stenosis measurements (when applicable) are obtained utilizing NASCET criteria, using the distal internal carotid diameter as the denominator. Multiphase CT imaging of the brain was performed following IV bolus contrast injection. Subsequent parametric perfusion maps were calculated using RAPID software. CONTRAST:  90 mL Isovue 370 COMPARISON:  Head CT 05/27/2017 FINDINGS: CTA NECK FINDINGS Aortic arch: There is moderate calcific atherosclerosis of the aortic arch. There is no aneurysm, dissection or hemodynamically significant stenosis of the visualized ascending aorta and aortic arch. Conventional 3 vessel aortic branching pattern. The visualized proximal subclavian arteries are normal. Right carotid system: The right common carotid origin is widely patent. There  is no common carotid or internal carotid artery dissection or aneurysm. Atherosclerotic calcification at the carotid bifurcation without hemodynamically significant stenosis. Left carotid system: The left common carotid origin is widely patent. There is no common carotid or internal carotid artery dissection or aneurysm. Atherosclerotic calcification at the carotid bifurcation without hemodynamically significant stenosis. Vertebral arteries: The vertebral system is left dominant. Both vertebral artery origins are normal. Both vertebral arteries are normal to their confluence with the basilar artery. Skeleton: Multilevel cervical facet arthrosis. No bony spinal canal stenosis. No skull lesion. Other neck: The nasopharynx is clear. The oropharynx and hypopharynx are normal. The epiglottis is normal. The supraglottic larynx, glottis and subglottic larynx are normal. No retropharyngeal collection. The parapharyngeal spaces are preserved. The parotid and submandibular glands are normal. No sialolithiasis or salivary ductal dilatation. The thyroid gland is normal. There is no cervical lymphadenopathy. Upper chest: Incompletely visualized right pleural effusion. Review of the MIP images confirms the above findings CTA HEAD FINDINGS Anterior circulation: --Intracranial internal carotid arteries: Normal. --Anterior cerebral arteries: The right middle cerebral artery M 1 segment is occluded at its distal aspect. There is very poor collateralization in the right MCA territory. The left middle cerebral artery is normal. --Middle cerebral arteries: Normal. --Posterior communicating arteries: Absent bilaterally. Posterior circulation: --Posterior cerebral arteries: Normal. --Superior cerebellar arteries: Normal. --Basilar artery: Normal. --Anterior inferior cerebellar arteries: Normal. --Posterior inferior cerebellar arteries: Normal. Venous sinuses: As permitted by contrast timing, patent. Anatomic variants: None Delayed phase:  Not performed. 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 Review of the MIP images confirms the above findings. CT Brain Perfusion Findings: CBF (<30%) Volume: 64mL Perfusion (Tmax>6.0s) volume: 15mL Mismatch Volume: 153mL Infarction Location:Right MCA territory IMPRESSION: 1. Emergent large vessel occlusion of the distal M1 segment of the right middle cerebral artery. Very poor collateralization throughout the right MCA territory. 2. Right MCA territory infarct of 52 mL with 108 mL ischemic penumbra. 3. No midline shift or other mass effect.  No acute hemorrhage. 4. Hila convexity meningioma. 5. Carotid and aortic atherosclerosis (ICD10-I70.0) without  hemodynamically significant stenosis of the carotid or vertebral arteries. These results were communicated to Dr. Kerney Elbe at 6:27 pm on 05/27/2017 by text page via the [facility] messaging system. I am currently awaiting confirmatory telephone call. Electronically Signed   By: Ulyses Jarred M.D.   On: 05/27/2017 18:33   Dg Chest Port 1 View  Result Date: 05/28/2017 CLINICAL DATA:  84 year old and stroke. EXAM: PORTABLE CHEST 1 VIEW COMPARISON:  02/21/2010 FINDINGS: Left dual-chamber cardiac pacemaker. The pacemaker leads are incompletely evaluated. Patient is rotated towards the right. Hazy densities in the right lung. Heart size is within normal limits. Negative for pneumothorax. Atherosclerotic calcifications at the aortic arch. IMPRESSION: Hazy densities in the right lung. Findings are nonspecific but could be related to mild asymmetric edema and recommend continued follow-up. Electronically Signed   By: Markus Daft M.D.   On: 05/28/2017 09:06   Vas [place] Groin Pseudoaneurysm  Result Date: 05/28/2017  ARTERIAL PSEUDOANEURYSM  Exam: Right groin History: S/p catheterization.  Examination Guidelines: A complete evaluation includes B-mode imaging, spectral doppler, color doppler, and power doppler as needed of all accessible portions of each vessel.  Bilateral testing is considered an integral part of a complete examination. Limited examinations for reoccurring indications may be performed as noted. +------------+----------+---------+------+----------+ Right DuplexPSV (cm/s)Waveform PlaqueComment(s) +------------+----------+---------+------+----------+ CFA                   triphasic                 +------------+----------+---------+------+----------+  Final Interpretation No evidence of pseudoaneurysm, AVF, or DVT in the right groin.  Harold Barban Electronically signed by Harold Barban on 05/28/2017 at 46:27:03 PM.   --------------------------------------------------------------------------------  Final   Ct Head Code Stroke Wo Contrast  Result Date: 05/27/2017 CLINICAL DATA:  Code stroke. 84 year old male with left side neurologic deficits. Last seen normal 1630 hrs. EXAM: CT HEAD WITHOUT CONTRAST TECHNIQUE: Contiguous axial images were obtained from the base of the skull through the vertex without intravenous contrast. COMPARISON:  No prior head CT. FINDINGS: Brain: Confluent hypodensity in the right inferior frontal gyrus near the operculum appears mostly confined to the white matter. No other right MCA territory cytotoxic edema is identified. There is a cytotoxic edema in the medial and posterior left occipital lobe with no regional mass effect. Other scattered white matter hypodensity, but no other cytotoxic edema identified. No acute intracranial hemorrhage identified. No ventriculomegaly. No intracranial mass effect. There does appear to be a small area of chronic cortical encephalomalacia in the left posterior frontal lobe (sagittal image 49). Vascular: Hyperdense right MCA and right MCA bifurcation best demonstrated on coronal image 29 and sagittal image 19. Dominant appearing distal left vertebral artery. Skull: No acute osseous abnormality identified. Sinuses/Orbits: Clear. Other: No acute findings. ASPECTS [facility] Stroke Program  Early CT Score) - Ganglionic level infarction (caudate, lentiform nuclei, internal capsule, insula, M1-M3 cortex): 6 (abnormal right M1 segment) - Supraganglionic infarction (M4-M6 cortex): 3 Total score (0-10 with 10 being normal): 9 IMPRESSION: 1. Positive for hyperdense right MCA. Hypodensity in the right frontal lobe appears mostly confined to the white matter. ASPECTS is 9. No hemorrhage or mass effect. 2. Subacute appearing left PCA territory infarct with no hemorrhage or mass effect. 3. These results were communicated to Dr. Cheral Marker at 5:53 pmon 2/11/2019by text page via the [facility] [facility] Electronically Signed   By: Genevie Ann M.D.   On: 05/27/2017 17:54   TTE  - Left ventricle: The cavity size was  normal. Wall thickness was increased in a pattern of mild LVH. Systolic function was normal. The estimated ejection fraction was in the range of 60% to 65%. Wall motion was normal; there were no regional wall motion abnormalities. Features are consistent with a pseudonormal left ventricular filling pattern, with concomitant abnormal relaxation and increased filling pressure (grade 2 diastolic dysfunction). - Aortic valve: Trileaflet; mildly thickened, mildly calcified leaflets. - Aorta: The aorta was mildly calcified. - Mitral valve: There was trivial regurgitation. - Left atrium: The atrium was moderately dilated. - Right ventricle: The cavity size was mildly dilated. Wall thickness was normal. Pacer wire or catheter noted in right ventricle. - Right atrium: The atrium was mildly dilated. - Tricuspid valve: There was mild regurgitation. Impressions: - No cardiac source of emboli was indentified.  PORTABLE CHEST 1 VIEW 05/29/2017 07:54 IMPRESSION: Fairly stable appearance of the chest with exception of increased small bilateral pleural effusions. CHF with mild pulmonary vascular congestion greatest on the right.   Thoracic aortic atherosclerosis.     HISTORY  OF [facility] COURSE ASSESSMENT/PLAN Mr. CAYLIN RABY is a 84 y.o. male with history of A. fib s/p pacemaker not on AC due to recent ?? SDH, hypertension admitted for acute onset left-sided weakness and right gaze. No tPA given due to left PCA in the right MCA hypodensity on CT.  Status post endovascular treatment.  Stroke:  Large right MCA, small left MCA, moderate left PCA infarct embolic secondary to A. fib not on AC  Resultant right gaze, left hemiparesis  CT head left PCA right MCA subacute infarcts  CTA head and neck right M1 cutoff  CT perfusion positive penumbra patter  DSA righ M1 TICI2b reperfusiont   MRI large right MCA, small left MCA, and moderate left PCA infarcts  MRA persistent right M2 high-grade stenosis status post endovascular treatment  2D Echo EF 60-65%  LDL 77  HgbA1c 5.4  Lovenox for VTE prophylaxis  Fall precautions  DIET - DYS 1 Room service appropriate? Yes; Fluid consistency: Nectar Thick   No antithrombotic prior to admission, now on aspirin 325 mg daily. Resume eliquis 10 days given large size of stroke  Patient counseled to be compliant with his antithrombotic medications  Ongoing aggressive stroke risk factor management  Therapy recommendations:  CIR  Disposition:  CIR  Traumatic SDH - absorbed  9-01/2017 had a fall, did not hit head - felt leg weakness  Stopped eliquis early 04/2017 due to cardiology concerning for "bleeding in head" - No CT head image or report to review  Followed with Dr. Bridgette Habermann at [facility] NSG and repeat CT head on 05/10/17 showed SDH completely reabsorbed and small meningioma - but recommend ASA instead of AC  Atrial fibrillation  Intermittent atrial fibrillation status post pacemaker  Was on Eliquis  Eliquis stopped early 04/2017  Currently rate controlled with atenolol  On ASA for now  Resume Eliquis in 10 days due to large size of infarct  No need for repeat brain imaging  unless change in neuro exam  Hypertension  Stable  BP goal less than 160 status post procedure  On atenolol daily and Lasix as needed  Long term BP goal normotensive  Hyperlipidemia  Home meds: Zocor  LDL 77, goal < 70  Now on Zocor 40 resumed  Continue statin at discharge  Other Stroke Risk Factors  Advanced age  Other Active Problems  Urinary Retention - Flomax 0.8 one time dose given  this AM, then added daily 0.4 mg daily dosing. Bladder scan q 4hrs.  Right groin hematoma -ultrasound no pseudoaneurysm  Left frontoparietal small meningioma  DISCHARGE EXAM Blood pressure (!) 141/84, pulse 65, temperature 97.7 F (36.5 C), temperature source Axillary, resp. rate 16, SpO2 100 %. SUBJECTIVE (INTERVAL HISTORY) No family is at the bedside.  Overall he feels his condition is gradually improving. Still has right gaze and left hemiplegia. Feeding self breakfast on exam. Continues to have urinary retention per nursing overnight. Will add Flomax and continue to bladder scan q 4 hours for now. To be discharged to CIR today   General - Well nourished, well developed, in no apparent distress.  Ophthalmologic - fundi not visualized due to noncooperation.  Cardiovascular - Regular rate and rhythm with pacing.  Neuro - awake alert, orientated to time, place and people. Eyes open, PERRL, follows simple commands on the right. Hard of hearing bilaterally. Right gaze and not able to cross midline, left neglect. Not blinking to visual threat on the left. Left facial droop. Tongue midline. Left facial sensation decreased. LUE 0/5 mild withdraw with pain. LLE 2/5 on painful stimulation. RUE 4/5 and RLE not able to test due to right groin hematoma. Bilateral babinski positive. DTR 1+. Sensation decreased on the left UE and LE, coordination intact right UE and gait not tested.  Discharge Diet  Fall precautions DIET - DYS 1 Room service appropriate? Yes; Fluid consistency: Nectar  Thick liquids  DISCHARGE PLAN  Disposition:  Transfer to [facility] for ongoing PT, OT and ST  aspirin 325 mg daily for secondary stroke prevention.  Recommend ongoing risk factor control by Primary Care Physician at time of discharge from inpatient rehabilitation.  Follow-up Patient, No Pcp Per in 2 weeks following discharge from rehab. Case Management aware of need.  Follow-up with Stroke Clinic in 6 weeks, office to schedule an appointment.   Greater than 30 minutes were spent preparing discharge.  Renie Ora Stroke Neurology Team 05/30/2017 12:33 PM  I reviewed above note and agree with the assessment and plan. I have made any additions or clarifications directly to the above note. Pt was seen and examined.   Rosalin Hawking, MD PhD Stroke Neurology 05/30/2017 10:53 PM

## 2017-05-30 NOTE — PMR Pre-admission (Signed)
PMR Admission Coordinator Pre-Admission Assessment  Patient: Derek Hays is an 84 y.o., male MRN: [id number] DOB: [birth year] Height:   Weight:              Insurance Information HMO: No    PPO:       PCP:       IPA:       80/20:       OTHER:   PRIMARY:  Medicare A and B      Policy#: [id number]      Subscriber: Patient CM Name:        Phone#:       Fax#:   No Pre-Cert#:        Employer:  Retired Benefits:  Phone #:       Name: Checked in Passport one source [employer] Date: A=02/14/98 and B=10/15/98     Deduct: $1364      Out of Pocket Max: none      Life Max: Unlimited CIR: 100%      SNF: 100 days Outpatient: 80%     Co-Pay: 20% Home Health: 100%      Co-Pay: none DME: 80%     Co-Pay: 20% Providers: patient's choice  SECONDARY: Tricare for Life      Policy#: [id number]      Subscriber:  patient CM Name:        Phone#:       Fax#:   Pre-Cert#:        Employer: Retired Benefits:  Phone #: [phone]     Name:   Eff. Date:       Deduct:        Out of Pocket Max:        Life Max:   CIR:        SNF:   Outpatient:       Co-Pay:   Home Health:        Co-Pay:   DME:       Co-Pay:    Emergency Contact Information Contact Information    Name Relation Home Work [place] [phone]  [phone]     Current Medical History  Patient Admitting Diagnosis:  R MCA infarct  History of Present Illness: An 84 y.o.right handed malewith history of hypertension, atrial fibrillation/pacemaker2016 on Eliquisin the past but discontinued after traumatic SDH September 2018 with repeat head CT scan 05/10/2017 showed SDH completely reabsorbed followed by Dr Bridgette Habermann [facility]. Per chart review patient lives with spouse. Independent using a rolling walker/cane and still driving. One level home with one step to entry. Presented2/11/2019with acute onset of left-sided weakness as well as slurred speech. CT of the head positive for hyperdense right MCA. No hemorrhage or mass effect.  Subacute appearing left PCA territory infarct. CT cerebral perfusion scan emergent large vessel occlusion of the distal M1 segment of the right middle cerebral artery. Underwent revascularization per interventional radiology.Echocardiogram with ejection fraction 51% grade 2 diastolic dysfunction.MRI/MRA02/03/2018 acute right MCA infarct involving the right insula and frontal parietal lobe. Small acute or subacute infarct left parietal lobe.Marland KitchenMRA status post revascularization of right middle cerebral artery occlusion. There remain moderate to severe stenosis proximal right M2 segment which felt to be due to underlying atherosclerotic disease.Presently on aspirin for CVA prophylaxis and planned is to resume ELIQUIS 7-10 days due to size of infarction.[name] Dysphagia #1 nectar thick liquid diet. Physicaland occupationaltherapy evaluationscompleted 05/28/2017 with recommendations of physical medicine rehabilitation consult. Patient to be admitted for a comprehensive inpatient  rehabilitation program.   Total: 13=NIH  Past Medical History  Past Medical History:  Diagnosis Date  . Atrial fibrillation ([place])   . Hypertension     Family History  family history is not on file.  Prior Rehab/Hospitalizations: No rehab recently.  Has the patient had major surgery during 100 days prior to admission? No  Current Medications   Current Facility-Administered Medications:  .   stroke: mapping our early stages of recovery book, , Does not apply, Once, Kerney Elbe, MD .  acetaminophen (TYLENOL) tablet 650 mg, 650 mg, Oral, Q4H PRN **OR** acetaminophen (TYLENOL) solution 650 mg, 650 mg, Per Tube, Q4H PRN **OR** acetaminophen (TYLENOL) suppository 650 mg, 650 mg, Rectal, Q4H PRN, Kerney Elbe, MD .  aspirin EC tablet 325 mg, 325 mg, Oral, Daily, 325 mg at 05/29/17 1008 **OR** aspirin suppository 300 mg, 300 mg, Rectal, Daily, Rosalin Hawking, MD .  atenolol (TENORMIN) tablet 50 mg, 50 mg, Oral, Daily, Kerney Elbe, MD, 50 mg at 05/29/17 1008 .  chlorhexidine (PERIDEX) 0.12 % solution 15 mL, 15 mL, Mouth Rinse, BID, Rosalin Hawking, MD, 15 mL at 05/29/17 2200 .  enoxaparin (LOVENOX) injection 40 mg, 40 mg, Subcutaneous, Q24H, Rosalin Hawking, MD, 40 mg at 05/29/17 2200 .  furosemide (LASIX) tablet 20 mg, 20 mg, Oral, Daily PRN, Kerney Elbe, MD .  labetalol (NORMODYNE,TRANDATE) injection 10-20 mg, 10-20 mg, Intravenous, Q2H PRN, Rosalin Hawking, MD .  latanoprost (XALATAN) 0.005 % ophthalmic solution 1 drop, 1 drop, Both Eyes, QHS, Kerney Elbe, MD, 1 drop at 05/29/17 2201 .  levothyroxine (SYNTHROID, LEVOTHROID) tablet 112 mcg, 112 mcg, Oral, QAC breakfast, Kerney Elbe, MD, 112 mcg at 05/29/17 1000 .  nitroGLYCERIN 25 mcg in sodium chloride 0.9 % 59.71 mL (25 mcg/mL) syringe, , , PRN, Luanne Bras, MD, 25 mcg at 05/27/17 2205 .  polyvinyl alcohol (LIQUIFILM TEARS) 1.4 % ophthalmic solution 1 drop, 1 drop, Both Eyes, Q6H PRN, Kerney Elbe, MD .  pregabalin (LYRICA) capsule 100 mg, 100 mg, Oral, BID, Kerney Elbe, MD, 100 mg at 05/29/17 2159 .  RESOURCE THICKENUP CLEAR, , Oral, PRN, Rosalin Hawking, MD .  senna-docusate (Senokot-S) tablet 1 tablet, 1 tablet, Oral, QHS PRN, Kerney Elbe, MD .  simvastatin (ZOCOR) tablet 40 mg, 40 mg, Oral, QHS, Kerney Elbe, MD, 40 mg at 05/29/17 2200 .  tamsulosin (FLOMAX) capsule 0.4 mg, 0.4 mg, Oral, QPC supper, Costello, Mary A, NP .  tamsulosin (FLOMAX) capsule 0.8 mg, 0.8 mg, Oral, Once, Costello, Mary A, NP .  timolol (TIMOPTIC-XR) 0.5 % ophthalmic gel-forming 1 drop, 1 drop, Left Eye, q morning - 10a, Kerney Elbe, MD, 1 drop at 05/29/17 1008  Patients Current Diet: Fall precautions DIET - DYS 1 Room service appropriate? Yes; Fluid consistency: Nectar Thick  Precautions / Restrictions Precautions Precautions: Fall Precaution Comments: left neglect, pusher Restrictions Weight Bearing Restrictions: No   Has the patient had 2 or more falls or a fall with  injury in the past year?No.  Patient reports 1 fall this past year trying to open his shed.  Prior Activity Level Community (5-7x/wk): Went out daily.  Was driving.  Home Assistive Devices / Equipment Home Assistive Devices/Equipment: None Home Equipment: Bedside commode, Grab bars - tub/shower, Grab bars - toilet, Walker - 2 wheels, Cane - single point, Crutches  Prior Device Use: Indicate devices/aids used by the patient prior to current illness, exacerbation or injury? Cane and rollator.  Prior Functional Level Prior Function Level of Independence: Independent Comments: ADLs, IADLs,  driving. using RW, rollator, and cane for mobility  Self Care: Did the patient need help bathing, dressing, using the toilet or eating?  Independent  Indoor Mobility: Did the patient need assistance with walking from room to room (with or without device)? Independent  Stairs: Did the patient need assistance with internal or external stairs (with or without device)? Independent  Functional Cognition: Did the patient need help planning regular tasks such as shopping or remembering to take medications? Independent  Current Functional Level Cognition  Arousal/Alertness: Awake/alert Overall Cognitive Status: Impaired/Different from baseline Current Attention Level: Focused Orientation Level: Oriented X4 Following Commands: Follows one step commands inconsistently, Follows one step commands with increased time Safety/Judgement: Decreased awareness of safety, Decreased awareness of deficits General Comments: patient continues to demonstrates left side inattention but is able to come past midline line with VCs Attention: Sustained, Selective Sustained Attention: Appears intact Selective Attention: Appears intact Memory: Impaired Memory Impairment: Decreased recall of new information, Prospective memory Awareness: Impaired Awareness Impairment: Emergent impairment, Anticipatory impairment(intellecutual  awareness developing) Problem Solving: Impaired Problem Solving Impairment: Functional basic Safety/Judgment: Impaired    Extremity Assessment (includes Sensation/Coordination)  Upper Extremity Assessment: LUE deficits/detail LUE Deficits / Details: Brunstrom stage 1 with no active movement. Pt with neglect of left side and poor sensation LUE Sensation: decreased light touch, decreased proprioception LUE Coordination: decreased fine motor, decreased gross motor  Lower Extremity Assessment: Defer to PT evaluation LLE Deficits / Details: grossly 2/5 pt able to move it spontaneously but not moving it on command    ADLs  Overall ADL's : Needs assistance/impaired Grooming: Wash/dry face, Maximal assistance, Sitting Grooming Details (indicate cue type and reason): Pt able to bring wash cloth to face with Max A for sitting at EOB Upper Body Bathing: Maximal assistance, Bed level Lower Body Bathing: Total assistance, Bed level Upper Body Dressing : Maximal assistance, Bed level Lower Body Dressing: Total assistance, Bed level Toileting- Clothing Manipulation and Hygiene: Total assistance, Bed level General ADL Comments: Pt presenting with no AROM of LUE, poor cognition, inattention to left side (body and environment), and decreased balance. Pt maintaining sitting at EOB with Max A; moments with Min A. Pt pushing towards L side during sitting and standing. Very poor attention to left side, able to track with Max verbal and visual cues.     Mobility  Overal bed mobility: Needs Assistance Bed Mobility: Rolling Supine to sit: Max assist, +2 for physical assistance, HOB elevated Sit to supine: Max assist, +2 for physical assistance General bed mobility comments: modest use of RUE with cues    Transfers  Overall transfer level: Needs assistance Transfers: Lateral/Scoot Transfers Sit to Stand: Max assist, +2 physical assistance General transfer comment: performed a 2 person lateral slide  transfer OOB to chair, patient able to initiate some RLE elevation during positioning in chair and use of RUE on right arm rest of chair    Ambulation / Gait / Stairs / Wheelchair Mobility  Ambulation/Gait General Gait Details: unable    Posture / Balance Dynamic Sitting Balance Sitting balance - Comments: very minimal trunk engagement with activity, able to use RUE to pull to upright and reposition with max assist Balance Overall balance assessment: Needs assistance Sitting-balance support: Single extremity supported Sitting balance-Leahy Scale: Poor Sitting balance - Comments: very minimal trunk engagement with activity, able to use RUE to pull to upright and reposition with max assist Postural control: Left lateral lean, Posterior lean Standing balance-Leahy Scale: Zero    Special needs/care consideration  BiPAP/CPAP No CPM No Continuous Drip IV No Dialysis No         Life Vest* No Oxygen No Special Bed No Trach Size No Wound Vac (area) No      Skin Itchy skin from past injury in [place]                            Bowel mgmt:No documented BM since admission. Bladder mgmt: Incontinence Diabetic mgmt Yes, controlled by diet    Previous Home Environment Living Arrangements: Spouse/significant other  Lives With: Spouse Available Help at Discharge: Family, Available 24 hours/day Type of Home: House Home Layout: One level Home Access: Stairs to enter [employer] of Steps: 1 Bathroom Shower/Tub: Multimedia programmer: Standard(BSC) Home Care Services: No  Discharge Living Setting Plans for Discharge Living Setting: House, Lives with (comment)(Lives with wife.) Patient and his wife have been together for 60 years. Type of Home at Discharge: House Discharge Home Layout: One level Discharge Home Access: Stairs to enter Entrance Stairs-Number of Steps: 1 step at the front and 1 step at the garage entry Does the patient have any problems obtaining your  medications?: No  Social/Family/Support Systems Patient Roles: Spouse(Has a wife.) Contact Information: Mariusz Jubb - wife - [phone] Anticipated Caregiver: Wife Ability/Limitations of Caregiver: Wife cannot do physical assistance, but can provide supervision Caregiver Availability: 24/7 Discharge Plan Discussed with Primary Caregiver: Yes Is Caregiver In Agreement with Plan?: Yes Does Caregiver/Family have Issues with Lodging/Transportation while Pt is in Rehab?: No  Goals/Additional Needs Patient/Family Goal for Rehab: PT/OT min to mod assist, SLP supervision to mod I goals Expected length of stay: 19-22 days Cultural Considerations: Catholic Dietary Needs: Dys 1, nectar thick liquids Equipment Needs: TBD Additional Information: Wife reports that patient and she recently moved from [place], [place] Pt/Family Agrees to Admission and willing to participate: Yes Program Orientation Provided & Reviewed with Pt/Caregiver Including Roles  & Responsibilities: Yes  Decrease burden of Care through IP rehab admission: Yes  Possible need for SNF placement upon discharge: May need SNF after CIR if wife cannot manage functional level achieved at home after discharge.    Patient Condition: This patient's medical and functional status has changed since the consult dated: 05/28/17 in which the Rehabilitation Physician determined and documented that the patient's condition is appropriate for intensive rehabilitative care in an inpatient rehabilitation facility. See "History of Present Illness" (above) for medical update. Functional changes are: Currently requiring max assist for sit to stand. Patient's medical and functional status update has been discussed with the Rehabilitation physician and patient remains appropriate for inpatient rehabilitation. Will admit to inpatient rehab today.  Preadmission Screen Completed By:  [name], 05/30/2017 10:57  AM ______________________________________________________________________   Discussed status with Dr. Letta Pate on 05/30/17 at 1057 and received telephone approval for admission today.  Admission Coordinator:  [name], time 1057/Date 05/30/17

## 2017-05-30 NOTE — Progress Notes (Signed)
Rehab admissions - Patient has been cleared by attending MD for acute inpatient rehab admission for today.  I spoke with wife this morning and she is in agreement.  Will admit to inpatient rehab today.  Call me for questions.  #[phone]

## 2017-05-30 NOTE — Progress Notes (Signed)
Called report to CIR. No new concerns. Pt and family educated on transfer. VSS. Staff will transport in bed

## 2017-05-30 NOTE — Care Management Note (Signed)
Case Management Note  Patient Details  Name: Derek Hays MRN: [id number] Date of Birth: February 23, 1931  Subjective/Objective:     Pt admitted with CVA. He is from home with spouse.          PCP: none  Insurance: medicare  Action/Plan: CM consulted for PCP. Pt discharging to CIR and CM will not know when to arrange f/u appointment with PCP. If CIR will please arrange for PCP at d/c from rehab.   Expected Discharge Date:  05/30/17               Expected Discharge Plan:  [name]  In-House Referral:     Discharge planning Services  CM Consult  Post Acute Care Choice:    Choice offered to:     DME Arranged:    DME Agency:     HH Arranged:    HH Agency:     Status of Service:  Completed, signed off  If discussed at [employer], dates discussed:    Additional Comments:  Pollie Friar, RN 05/30/2017, 10:20 AM

## 2017-05-30 NOTE — Progress Notes (Signed)
Referring Physician(s): Dr. Estanislado Pandy  Supervising Physician: Luanne Bras  Patient Status:  [facility] - In-pt  Chief Complaint:  Stroke  Subjective:  Sitting up in bed.  Conversant.  Ready to go to rehab.   Allergies: Patient has no known allergies.  Medications: Prior to Admission medications   Medication Sig Start Date End Date Taking? Authorizing Provider  atenolol (TENORMIN) 50 MG tablet Take 50 mg by mouth daily.   Yes [provider]  Dextran 70-Hypromellose, PF, (ARTIFICIAL TEARS PF) 0.1-0.3 % SOLN Place 1 drop into both eyes every 6 (six) hours as needed (for dry eyes).   Yes [provider]  furosemide (LASIX) 20 MG tablet Take 20 mg by mouth daily as needed for fluid.   Yes [provider]  latanoprost (XALATAN) 0.005 % ophthalmic solution Place 1 drop into both eyes at bedtime.   Yes [provider]  levothyroxine (SYNTHROID, LEVOTHROID) 112 MCG tablet Take 112 mcg by mouth daily before breakfast.   Yes [provider]  NIFEdipine (ADALAT CC) 90 MG 24 hr tablet Take 90 mg by mouth daily.   Yes [provider]  pregabalin (LYRICA) 100 MG capsule Take 100 mg by mouth 2 (two) times daily.   Yes [provider]  simvastatin (ZOCOR) 40 MG tablet Take 40 mg by mouth at bedtime.   Yes [provider]  timolol (TIMOPTIC-XE) 0.5 % ophthalmic gel-forming Place 1 drop into the left eye every morning.   Yes [provider]     Vital Signs: BP (!) 141/84 (BP Location: Right Arm)   Pulse 65   Temp 97.7 F (36.5 C) (Axillary)   Resp 16   SpO2 100%   Physical Exam  NAD, alert Neuro:  Slow speech, some slurring.  Moving right side well, still with deficit on the left.  Groin: soft.  Large area of bruising well demarcated without signs of progression.  Slightly tender.   Labs:  CBC: Recent Labs    05/27/17 1730 05/27/17 1739 05/28/17 0611 05/29/17 0455 05/30/17 0239  WBC 7.5  --   9.2 12.4* 9.7  HGB 14.7 14.6 12.6* 11.5* 11.9*  HCT 42.4 43.0 36.8* 34.6* 35.4*  PLT 218  --  216 230 199    COAGS: Recent Labs    05/27/17 1730  INR 1.01  APTT 30    BMP: Recent Labs    05/27/17 1730 05/27/17 1739 05/28/17 0611 05/29/17 0455 05/30/17 0239  NA 139 140 139 140 139  K 4.2 4.1 3.8 4.1 4.0  CL 105 105 110 111 109  CO2 24  --  17* 17* 20*  GLUCOSE 128* 128* 187* 118* 133*  BUN 26* 27* 24* 25* 23*  CALCIUM 9.3  --  8.6* 8.7* 8.6*  CREATININE 1.46* 1.40* 1.35* 1.32* 1.25*  GFRNONAA 42*  --  47* 48* 51*  GFRAA 49*  --  54* 55* 59*    LIVER FUNCTION TESTS: Recent Labs    05/27/17 1730  BILITOT 1.1  AST 22  ALT 10*  ALKPHOS 76  PROT 6.9  ALBUMIN 3.9    Assessment and Plan: R MCA revascularization Patient s/p revascualarization for acute stroke.  He subsequently developed a R groin hematoma which was evaluated with [place].  This is improving and is now soft.  He is progressing well and is expected to discharge today to inpatient rehab.  IR available if needed.   Electronically Signed: Docia Barrier, PA 05/30/2017, 11:49 AM   I  spent a total of 15 Minutes at the the patient's bedside AND on the patient's hospital floor or unit, greater than 50% of which was counseling/coordinating care for stroke.

## 2017-05-30 NOTE — H&P (Signed)
Physical Medicine and Rehabilitation Admission H&P         Chief Complaint  Patient presents with  . Code Stroke      LKW 3  : HPI: Derek Hays is a 84 y.o. 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 Per chart review patient lives with spouse. Independent using a rolling walker/cane and still driving. One level home with one step to entry. Presented 05/27/2017 with acute onset of left-sided weakness as well as slurred speech. CT of the head positive for hyperdense right MCA. No hemorrhage or mass effect. Subacute appearing left PCA territory infarct. CT cerebral perfusion scan emergent large vessel occlusion of the distal M1 segment of the right middle cerebral artery. Underwent revascularization per interventional radiology. Echocardiogram with ejection fraction 29% grade 2 diastolic dysfunction. MRI/MRA 05/28/2017 acute right MCA infarct involving the right insula and frontal parietal lobe. Small acute or subacute infarct left parietal lobe.Derek Hays MRA status post revascularization of right middle cerebral artery occlusion. There remain moderate to severe stenosis proximal right M2 segment which felt to be due to underlying atherosclerotic disease. Presently on aspirin for CVA prophylaxis and planned is to resume ELIQUIS  in10 days due to size of infarction.Derek Hays Dysphagia #1 nectar thick liquid diet. Physical and occupational therapy evaluations completed 05/28/2017 with recommendations of physical medicine rehabilitation consult. Patient was admitted for a comprehensive rehabilitation program       ROS Constitutional: Negative for chills and fever. Denies weight loss HENT: Negative for hearing loss.   Eyes: Negative for blurred vision and double vision. Negative photophobia Respiratory:  Negative for cough and shortness of breath.   Cardiovascular: Positive for palpitations. Negative for chest pain and leg swelling.  Gastrointestinal: Positive for constipation. Negative for nausea and vomiting.  Genitourinary: Positive for urgency. Negative for dysuria, flank pain and hematuria.  Musculoskeletal: Positive for joint pain and myalgias.  Skin: Negative for rash.  Neurological: Positive for sensory change, speech change and focal weakness.  negative for seizures All other systems reviewed and are negative     Past Medical History:  Diagnosis Date  . Atrial fibrillation ([place])    . Hypertension           Past Surgical History:  Procedure Laterality Date  . IR CT HEAD LTD   05/27/2017  . IR PERCUTANEOUS ART THROMBECTOMY/INFUSION INTRACRANIAL INC DIAG ANGIO   05/27/2017  . RADIOLOGY WITH ANESTHESIA N/A 05/27/2017    Procedure: RADIOLOGY WITH ANESTHESIA;  Surgeon: Luanne Bras, MD;  Location: [place];  Service: Radiology;  Laterality: N/A;    History reviewed. No pertinent family history. Social History:  reports that  has never smoked. he has never used smokeless tobacco. He reports that he drinks alcohol. He reports that he does not use drugs. Allergies: No Known Allergies Medications Prior to Admission  Medication Sig Dispense Refill  . atenolol (TENORMIN) 50 MG tablet Take 50 mg by mouth daily.      Derek Hays Dextran 70-Hypromellose, PF, (ARTIFICIAL TEARS PF) 0.1-0.3 % SOLN Place 1 drop into both eyes every 6 (six) hours as needed (for dry eyes).      . furosemide (LASIX) 20 MG tablet Take 20 mg by mouth daily as needed for fluid.      Derek Hays latanoprost (XALATAN) 0.005 % ophthalmic solution Place 1 drop into both eyes at  bedtime.      Derek Hays levothyroxine (SYNTHROID, LEVOTHROID) 112 MCG tablet Take 112 mcg by mouth daily before breakfast.      . NIFEdipine (ADALAT CC) 90 MG 24 hr tablet Take 90 mg by mouth daily.      . pregabalin (LYRICA) 100 MG capsule Take 100 mg by mouth 2 (two)  times daily.      . simvastatin (ZOCOR) 40 MG tablet Take 40 mg by mouth at bedtime.      . timolol (TIMOPTIC-XE) 0.5 % ophthalmic gel-forming Place 1 drop into the left eye every morning.          Drug Regimen Review Drug regimen was reviewed and remains appropriate with no significant issues identified   Home: Home Living Family/patient expects to be discharged to:: Private residence Living Arrangements: Spouse/significant other Available Help at Discharge: Family, Available 24 hours/day Type of Home: House Home Access: Stairs to enter Technical brewer of Steps: 1 Home Layout: One level Bathroom Shower/Tub: Multimedia programmer: Standard(BSC) Home Equipment: Bedside commode, Grab bars - tub/shower, Grab bars - toilet, Environmental consultant - 2 wheels, Cane - single point, Crutches  Lives With: Spouse   Functional History: Prior Function Level of Independence: Independent Comments: ADLs, IADLs, driving. using RW, rollator, and cane for mobility   Functional Status:  Mobility: Bed Mobility Overal bed mobility: Needs Assistance Bed Mobility: Rolling Supine to sit: Max assist, +2 for physical assistance, HOB elevated Sit to supine: Max assist, +2 for physical assistance General bed mobility comments: modest use of RUE with cues Transfers Overall transfer level: Needs assistance Transfers: Lateral/Scoot Transfers Sit to Stand: Max assist, +2 physical assistance General transfer comment: performed a 2 person lateral slide transfer OOB to chair, patient able to initiate some RLE elevation during positioning in chair and use of RUE on right arm rest of chair Ambulation/Gait General Gait Details: unable   ADL: ADL Overall ADL's : Needs assistance/impaired Grooming: Wash/dry face, Maximal assistance, Sitting Grooming Details (indicate cue type and reason): Pt able to bring wash cloth to face with Max A for sitting at EOB Upper Body Bathing: Maximal assistance, Bed  level Lower Body Bathing: Total assistance, Bed level Upper Body Dressing : Maximal assistance, Bed level Lower Body Dressing: Total assistance, Bed level Toileting- Clothing Manipulation and Hygiene: Total assistance, Bed level General ADL Comments: Pt presenting with no AROM of LUE, poor cognition, inattention to left side (body and environment), and decreased balance. Pt maintaining sitting at EOB with Max A; moments with Min A. Pt pushing towards L side during sitting and standing. Very poor attention to left side, able to track with Max verbal and visual cues.    Cognition: Cognition Overall Cognitive Status: Impaired/Different from baseline Arousal/Alertness: Awake/alert Orientation Level: Oriented X4 Attention: Sustained, Selective Sustained Attention: Appears intact Selective Attention: Appears intact Memory: Impaired Memory Impairment: Decreased recall of new information, Prospective memory Awareness: Impaired Awareness Impairment: Emergent impairment, Anticipatory impairment(intellecutual awareness developing) Problem Solving: Impaired Problem Solving Impairment: Functional basic Safety/Judgment: Impaired Cognition Arousal/Alertness: Awake/alert(initially lethargic but aroused with activity) Behavior During Therapy: Flat affect Overall Cognitive Status: Impaired/Different from baseline Area of Impairment: Attention, Memory, Following commands, Safety/judgement, Problem solving Current Attention Level: Focused Memory: Decreased short-term memory Following Commands: Follows one step commands inconsistently, Follows one step commands with increased time Safety/Judgement: Decreased awareness of safety, Decreased awareness of deficits Problem Solving: Slow processing, Decreased initiation, Difficulty sequencing, Requires verbal cues, Requires tactile cues General Comments: patient continues to demonstrates left side inattention but  is able to come past midline line with VCs    Physical Exam: Blood pressure 131/90, pulse (!) 59, temperature 98 F (36.7 C), temperature source Axillary, resp. rate 16, SpO2 99 %. Physical Exam  Vitals reviewed. Vitals reviewed. HENT:  Head: Normocephalic.  Eyes: EOM are normal. No discharge Neck: Normal range of motion. Neck supple. No thyromegaly present.  Cardiovascular: Normal rate and regular rhythm. No wheezes Respiratory: Effort normal and breath sounds normal. No respiratory distress.  GI: Soft. Bowel sounds are normal. He exhibits no distension/nontender Skin. Warm and dry Neurological:  Patient is a bit lethargic but arousable. He does have some decreased insight into his deficits. Right gaze preference. Followed simple commands. Speech is dysarthric but intelligible Motor strength is 0/5 in the left deltoid bicep tricep grip 2- at the left hip knee extensor synergy otherwise 0 Right upper extremity is 5/5 in the deltoid bicep tricep grip right lower extremities 4/5 in the right hip flexor knee extensor ankle dorsiflexor Sensation is absent to pinch as well as light touch in the left upper and left lower limb         Lab Results Last 48 Hours        Results for orders placed or performed during the hospital encounter of 05/27/17 (from the past 48 hour(s))  Protime-INR     Status: None    Collection Time: 05/27/17  5:30 PM  Result Value Ref Range    Prothrombin Time 13.2 11.4 - 15.2 seconds    INR 1.01        Comment: Performed at [facility], [address], [place], [place] [address]  APTT     Status: None    Collection Time: 05/27/17  5:30 PM  Result Value Ref Range    aPTT 30 24 - 36 seconds      Comment: Performed at [facility] [address] [place] [address]  CBC     Status: None    Collection Time: 05/27/17  5:30 PM  Result Value Ref Range    WBC 7.5 4.0 - 10.5 K/uL    RBC 4.74 4.22 - 5.81 MIL/uL    Hemoglobin 14.7 13.0 - 17.0 g/dL    HCT 42.4 39.0 - 52.0 %    MCV 89.5  78.0 - 100.0 fL    MCH 31.0 26.0 - 34.0 pg    MCHC 34.7 30.0 - 36.0 g/dL    RDW 13.5 11.5 - 15.5 %    Platelets 218 150 - 400 K/uL      Comment: Performed at [facility] [address] [place], [place] [address]  Differential     Status: None    Collection Time: 05/27/17  5:30 PM  Result Value Ref Range    Neutrophils Relative % 64 %    Neutro Abs 4.8 1.7 - 7.7 K/uL    Lymphocytes Relative 23 %    Lymphs Abs 1.7 0.7 - 4.0 K/uL    Monocytes Relative 10 %    Monocytes Absolute 0.7 0.1 - 1.0 K/uL    Eosinophils Relative 2 %    Eosinophils Absolute 0.2 0.0 - 0.7 K/uL    Basophils Relative 1 %    Basophils Absolute 0.1 0.0 - 0.1 K/uL      Comment: Performed at [facility] [address], [place],  [address]  Comprehensive metabolic panel     Status: Abnormal    Collection Time: 05/27/17  5:30 PM  Result Value Ref Range    Sodium 139 135 - 145 mmol/L    Potassium 4.2 3.5 - 5.1 mmol/L    Chloride 105 101 - 111 mmol/L    CO2 24 22 - 32 mmol/L    Glucose, Bld 128 (H) 65 - 99 mg/dL    BUN 26 (H) 6 - 20 mg/dL    Creatinine, Ser 1.46 (H) 0.61 - 1.24 mg/dL    Calcium 9.3 8.9 - 10.3 mg/dL    Total Protein 6.9 6.5 - 8.1 g/dL    Albumin 3.9 3.5 - 5.0 g/dL    AST 22 15 - 41 U/L    ALT 10 (L) 17 - 63 U/L    Alkaline Phosphatase 76 38 - 126 U/L    Total Bilirubin 1.1 0.3 - 1.2 mg/dL    GFR calc non Af Amer 42 (L) >60 mL/min    GFR calc Af Amer 49 (L) >60 mL/min      Comment: (NOTE) The eGFR has been calculated using the CKD EPI equation. This calculation has not been validated in all clinical situations. eGFR's persistently <60 mL/min signify possible Chronic Kidney Disease.      Anion gap 10 5 - 15      Comment: Performed at [facility] [address] [place], [place] [address]  CBG monitoring, ED     Status: Abnormal    Collection Time: 05/27/17  5:32 PM  Result Value Ref Range    Glucose-Capillary 128 (H) 65 - 99 mg/dL  I-stat troponin, ED      Status: None    Collection Time: 05/27/17  5:38 PM  Result Value Ref Range    Troponin i, poc 0.00 0.00 - 0.08 ng/mL    Comment 3               Comment: Due to the release kinetics of cTnI, a negative result within the first hours of the onset of symptoms does not rule out myocardial infarction with certainty. If myocardial infarction is still suspected, repeat the test at appropriate intervals.    I-Stat Chem 8, ED     Status: Abnormal    Collection Time: 05/27/17  5:39 PM  Result Value Ref Range    Sodium 140 135 - 145 mmol/L    Potassium 4.1 3.5 - 5.1 mmol/L    Chloride 105 101 - 111 mmol/L    BUN 27 (H) 6 - 20 mg/dL    Creatinine, Ser 1.40 (H) 0.61 - 1.24 mg/dL    Glucose, Bld 128 (H) 65 - 99 mg/dL    Calcium, Ion 1.04 (L) 1.15 - 1.40 mmol/L    TCO2 23 22 - 32 mmol/L    Hemoglobin 14.6 13.0 - 17.0 g/dL    HCT 43.0 39.0 - 52.0 %  MRSA PCR Screening     Status: None    Collection Time: 05/28/17 12:11 AM  Result Value Ref Range    MRSA by PCR NEGATIVE NEGATIVE      Comment:        The GeneXpert MRSA Assay (FDA approved for NASAL specimens only), is one component of a comprehensive MRSA colonization surveillance program. It is not intended to diagnose MRSA infection nor to guide or monitor treatment for MRSA infections. Performed at [facility], [place] [address], [place], [place] [address]    Hemoglobin A1c     Status: None    Collection Time: 05/28/17  5:00 AM  Result  Value Ref Range    Hgb A1c MFr Bld 5.4 4.8 - 5.6 %      Comment: (NOTE) Pre diabetes:          5.7%-6.4% Diabetes:              >6.4% Glycemic control for   <7.0% adults with diabetes      Mean Plasma Glucose 108.28 mg/dL      Comment: Performed at [facility] [address], [place], [place] [address]  CBC with Differential/Platelet     Status: Abnormal    Collection Time: 05/28/17  6:11 AM  Result Value Ref Range    WBC 9.2 4.0 - 10.5 K/uL    RBC 4.12 (L) 4.22 - 5.81 MIL/uL     Hemoglobin 12.6 (L) 13.0 - 17.0 g/dL    HCT 36.8 (L) 39.0 - 52.0 %    MCV 89.3 78.0 - 100.0 fL    MCH 30.6 26.0 - 34.0 pg    MCHC 34.2 30.0 - 36.0 g/dL    RDW 13.3 11.5 - 15.5 %    Platelets 216 150 - 400 K/uL    Neutrophils Relative % 91 %    Neutro Abs 8.4 (H) 1.7 - 7.7 K/uL    Lymphocytes Relative 7 %    Lymphs Abs 0.6 (L) 0.7 - 4.0 K/uL    Monocytes Relative 2 %    Monocytes Absolute 0.2 0.1 - 1.0 K/uL    Eosinophils Relative 0 %    Eosinophils Absolute 0.0 0.0 - 0.7 K/uL    Basophils Relative 0 %    Basophils Absolute 0.0 0.0 - 0.1 K/uL      Comment: Performed at [facility] [address] [place], [place] [address]  Basic metabolic panel     Status: Abnormal    Collection Time: 05/28/17  6:11 AM  Result Value Ref Range    Sodium 139 135 - 145 mmol/L    Potassium 3.8 3.5 - 5.1 mmol/L    Chloride 110 101 - 111 mmol/L    CO2 17 (L) 22 - 32 mmol/L    Glucose, Bld 187 (H) 65 - 99 mg/dL    BUN 24 (H) 6 - 20 mg/dL    Creatinine, Ser 1.35 (H) 0.61 - 1.24 mg/dL    Calcium 8.6 (L) 8.9 - 10.3 mg/dL    GFR calc non Af Amer 47 (L) >60 mL/min    GFR calc Af Amer 54 (L) >60 mL/min      Comment: (NOTE) The eGFR has been calculated using the CKD EPI equation. This calculation has not been validated in all clinical situations. eGFR's persistently <60 mL/min signify possible Chronic Kidney Disease.      Anion gap 12 5 - 15      Comment: Performed at [facility] [address] [place], [place] [address]  Lipid panel     Status: None    Collection Time: 05/28/17  6:11 AM  Result Value Ref Range    Cholesterol 133 0 - 200 mg/dL    Triglycerides 66 <150 mg/dL    HDL 43 >40 mg/dL    Total CHOL/HDL Ratio 3.1 RATIO    VLDL 13 0 - 40 mg/dL    LDL Cholesterol 77 0 - 99 mg/dL      Comment:        Total Cholesterol/HDL:CHD Risk Coronary Heart Disease Risk Table  Men   Women  1/2 Average Risk   3.4   3.3  Average Risk       5.0   4.4  2 X Average Risk    9.6   7.1  3 X Average Risk  23.4   11.0        Use the calculated Patient Ratio above and the CHD Risk Table to determine the patient's CHD Risk.        ATP III CLASSIFICATION (LDL):  <100     mg/dL   Optimal  100-129  mg/dL   Near or Above                    Optimal  130-159  mg/dL   Borderline  160-189  mg/dL   High  >190     mg/dL   Very High Performed at [facility] [address] [place], [place] [address]    CBC     Status: Abnormal    Collection Time: 05/29/17  4:55 AM  Result Value Ref Range    WBC 12.4 (H) 4.0 - 10.5 K/uL    RBC 3.85 (L) 4.22 - 5.81 MIL/uL    Hemoglobin 11.5 (L) 13.0 - 17.0 g/dL    HCT 34.6 (L) 39.0 - 52.0 %    MCV 89.9 78.0 - 100.0 fL    MCH 29.9 26.0 - 34.0 pg    MCHC 33.2 30.0 - 36.0 g/dL    RDW 13.7 11.5 - 15.5 %    Platelets 230 150 - 400 K/uL      Comment: Performed at [facility], [place] [address] [place], [place] [address]  Basic metabolic panel     Status: Abnormal    Collection Time: 05/29/17  4:55 AM  Result Value Ref Range    Sodium 140 135 - 145 mmol/L    Potassium 4.1 3.5 - 5.1 mmol/L    Chloride 111 101 - 111 mmol/L    CO2 17 (L) 22 - 32 mmol/L    Glucose, Bld 118 (H) 65 - 99 mg/dL    BUN 25 (H) 6 - 20 mg/dL    Creatinine, Ser 1.32 (H) 0.61 - 1.24 mg/dL    Calcium 8.7 (L) 8.9 - 10.3 mg/dL    GFR calc non Af Amer 48 (L) >60 mL/min    GFR calc Af Amer 55 (L) >60 mL/min      Comment: (NOTE) The eGFR has been calculated using the CKD EPI equation. This calculation has not been validated in all clinical situations. eGFR's persistently <60 mL/min signify possible Chronic Kidney Disease.      Anion gap 12 5 - 15      Comment: Performed at [facility] [address] [place], [place] [address]       Imaging Results (Last 48 hours)  Ct Angio Head W Or Wo Contrast   Result Date: 05/27/2017 CLINICAL DATA:  Code stroke.  Left-sided deficits. EXAM: CT ANGIOGRAPHY HEAD AND NECK CT PERFUSION BRAIN TECHNIQUE:  Multidetector CT imaging of the head and neck was performed using the standard protocol during bolus administration of intravenous contrast. Multiplanar CT image reconstructions and MIPs were obtained to evaluate the vascular anatomy. Carotid stenosis measurements (when applicable) are obtained utilizing NASCET criteria, using the distal internal carotid diameter as the denominator. Multiphase CT imaging of the brain was performed following IV bolus contrast injection. Subsequent parametric perfusion maps were calculated using RAPID software. CONTRAST:  90 mL Isovue 370 COMPARISON:  Head CT 05/27/2017 FINDINGS: CTA NECK FINDINGS Aortic arch: There is moderate calcific atherosclerosis of the aortic arch. There is no aneurysm, dissection or hemodynamically significant stenosis of the visualized ascending aorta and aortic arch. Conventional 3 vessel aortic branching pattern. The visualized proximal subclavian arteries are normal. Right carotid system: The right common carotid origin is widely patent. There is no common carotid or internal carotid artery dissection or aneurysm. Atherosclerotic calcification at the carotid bifurcation without hemodynamically significant stenosis. Left carotid system: The left common carotid origin is widely patent. There is no common carotid or internal carotid artery dissection or aneurysm. Atherosclerotic calcification at the carotid bifurcation without hemodynamically significant stenosis. Vertebral arteries: The vertebral system is left dominant. Both vertebral artery origins are normal. Both vertebral arteries are normal to their confluence with the basilar artery. Skeleton: Multilevel cervical facet arthrosis. No bony spinal canal stenosis. No skull lesion. Other neck: The nasopharynx is clear. The oropharynx and hypopharynx are normal. The epiglottis is normal. The supraglottic larynx, glottis and subglottic larynx are normal. No retropharyngeal collection. The parapharyngeal  spaces are preserved. The parotid and submandibular glands are normal. No sialolithiasis or salivary ductal dilatation. The thyroid gland is normal. There is no cervical lymphadenopathy. Upper chest: Incompletely visualized right pleural effusion. Review of the MIP images confirms the above findings CTA HEAD FINDINGS Anterior circulation: --Intracranial internal carotid arteries: Normal. --Anterior cerebral arteries: The right middle cerebral artery M 1 segment is occluded at its distal aspect. There is very poor collateralization in the right MCA territory. The left middle cerebral artery is normal. --Middle cerebral arteries: Normal. --Posterior communicating arteries: Absent bilaterally. Posterior circulation: --Posterior cerebral arteries: Normal. --Superior cerebellar arteries: Normal. --Basilar artery: Normal. --Anterior inferior cerebellar arteries: Normal. --Posterior inferior cerebellar arteries: Normal. Venous sinuses: As permitted by contrast timing, patent. Anatomic variants: None Delayed phase: Not performed. 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 Review of the MIP images confirms the above findings. CT Brain Perfusion Findings: CBF (<30%) Volume: 36m Perfusion (Tmax>6.0s) volume: 1675mMismatch Volume: 10870mnfarction Location:Right MCA territory IMPRESSION: 1. Emergent large vessel occlusion of the distal M1 segment of the right middle cerebral artery. Very poor collateralization throughout the right MCA territory. 2. Right MCA territory infarct of 52 mL with 108 mL ischemic penumbra. 3. No midline shift or other mass effect.  No acute hemorrhage. 4. Hila convexity meningioma. 5. Carotid and aortic atherosclerosis (ICD10-I70.0) without hemodynamically significant stenosis of the carotid or vertebral arteries. These results were communicated to Dr. EriKerney Elbe 6:27 pm on 05/27/2017 by text page via the [facility]aging system. I am currently awaiting confirmatory telephone call.  Electronically Signed   By: KevUlyses JarredD.   On: 05/27/2017 18:33    Ct Angio Neck W Or Wo Contrast   Result Date: 05/27/2017 CLINICAL DATA:  Code stroke.  Left-sided deficits. EXAM: CT ANGIOGRAPHY HEAD AND NECK CT PERFUSION BRAIN TECHNIQUE: Multidetector CT imaging of the head and neck was performed using the standard protocol during bolus administration of intravenous contrast. Multiplanar CT image reconstructions and MIPs were obtained to evaluate the vascular anatomy. Carotid stenosis measurements (when applicable) are obtained utilizing NASCET criteria, using the distal internal carotid diameter as the denominator. Multiphase CT imaging of the brain was performed following IV bolus contrast injection. Subsequent parametric perfusion maps were calculated using RAPID software. CONTRAST:  90 mL Isovue 370 COMPARISON:  Head CT 05/27/2017 FINDINGS: CTA NECK FINDINGS Aortic arch: There is moderate  calcific atherosclerosis of the aortic arch. There is no aneurysm, dissection or hemodynamically significant stenosis of the visualized ascending aorta and aortic arch. Conventional 3 vessel aortic branching pattern. The visualized proximal subclavian arteries are normal. Right carotid system: The right common carotid origin is widely patent. There is no common carotid or internal carotid artery dissection or aneurysm. Atherosclerotic calcification at the carotid bifurcation without hemodynamically significant stenosis. Left carotid system: The left common carotid origin is widely patent. There is no common carotid or internal carotid artery dissection or aneurysm. Atherosclerotic calcification at the carotid bifurcation without hemodynamically significant stenosis. Vertebral arteries: The vertebral system is left dominant. Both vertebral artery origins are normal. Both vertebral arteries are normal to their confluence with the basilar artery. Skeleton: Multilevel cervical facet arthrosis. No bony spinal canal  stenosis. No skull lesion. Other neck: The nasopharynx is clear. The oropharynx and hypopharynx are normal. The epiglottis is normal. The supraglottic larynx, glottis and subglottic larynx are normal. No retropharyngeal collection. The parapharyngeal spaces are preserved. The parotid and submandibular glands are normal. No sialolithiasis or salivary ductal dilatation. The thyroid gland is normal. There is no cervical lymphadenopathy. Upper chest: Incompletely visualized right pleural effusion. Review of the MIP images confirms the above findings CTA HEAD FINDINGS Anterior circulation: --Intracranial internal carotid arteries: Normal. --Anterior cerebral arteries: The right middle cerebral artery M 1 segment is occluded at its distal aspect. There is very poor collateralization in the right MCA territory. The left middle cerebral artery is normal. --Middle cerebral arteries: Normal. --Posterior communicating arteries: Absent bilaterally. Posterior circulation: --Posterior cerebral arteries: Normal. --Superior cerebellar arteries: Normal. --Basilar artery: Normal. --Anterior inferior cerebellar arteries: Normal. --Posterior inferior cerebellar arteries: Normal. Venous sinuses: As permitted by contrast timing, patent. Anatomic variants: None Delayed phase: Not performed. 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 Review of the MIP images confirms the above findings. CT Brain Perfusion Findings: CBF (<30%) Volume: 49m Perfusion (Tmax>6.0s) volume: 1660mMismatch Volume: 10870mnfarction Location:Right MCA territory IMPRESSION: 1. Emergent large vessel occlusion of the distal M1 segment of the right middle cerebral artery. Very poor collateralization throughout the right MCA territory. 2. Right MCA territory infarct of 52 mL with 108 mL ischemic penumbra. 3. No midline shift or other mass effect.  No acute hemorrhage. 4. Hila convexity meningioma. 5. Carotid and aortic atherosclerosis (ICD10-I70.0) without  hemodynamically significant stenosis of the carotid or vertebral arteries. These results were communicated to Dr. EriKerney Elbe 6:27 pm on 05/27/2017 by text page via the [facility] system. I am currently awaiting confirmatory telephone call. Electronically Signed   By: KevUlyses JarredD.   On: 05/27/2017 18:33    Derek Hays MraJodene Namad Wo Contrast   Result Date: 05/28/2017 CLINICAL DATA:  Stroke.  Endovascular clot retrieval 05/27/2017 EXAM: MRI HEAD WITHOUT CONTRAST MRA HEAD WITHOUT CONTRAST TECHNIQUE: Multiplanar, multiecho pulse sequences of the brain and surrounding structures were obtained without intravenous contrast. Angiographic images of the head were obtained using MRA technique without contrast. COMPARISON:  CTA 05/27/2017 FINDINGS: MRI HEAD FINDINGS Brain: Right MCA infarct with restricted diffusion in the right insula and right parietal lobe. Smaller area of acute infarct in the right frontal lobe. Small areas of acute infarct in the high right parietal lobe. No associated hemorrhage Restricted diffusion in the left parietal cortex compatible with a small area of acute infarct. Restricted diffusion in the left occipital lobe correlating to hypodensity on CT. This is compatible with subacute infarct. Negative for hemorrhage. 1 cm extra-axial  mass left parietal convexity compatible with meningioma. No mass-effect Mild atrophy.  Negative for hydrocephalus. Vascular: Normal arterial flow voids Skull and upper cervical spine: Negative Sinuses/Orbits: Sinuses clear.  Bilateral cataract removal. Other: None MRA HEAD FINDINGS Image quality degraded by motion.  Is Both vertebral arteries patent to the basilar. Basilar widely patent. PICA patent bilaterally. Posterior cerebral arteries patent bilaterally. Right internal carotid artery widely patent. Right anterior cerebral artery widely patent. Mild to moderate stenosis distal right middle cerebral artery. Moderate to severe stenosis proximal right M2 branch.  This vessel was thrombosed previously and appears patent with a significant stenosis which may be due to underlying atherosclerotic disease. Left internal carotid artery patent. Left anterior and middle cerebral arteries patent bilaterally. Negative for aneurysm IMPRESSION: Acute right MCA infarct involving the right insula and frontal parietal lobe. Negative for hemorrhage. Small acute or subacute infarct left parietal lobe. Subacute infarct left occipital lobe. Findings suggest emboli. Status post revascularization of right middle cerebral artery occlusion. There remains moderate to severe stenosis proximal right M2 segment which may be due to underlying atherosclerotic disease. Electronically Signed   By: Franchot Gallo M.D.   On: 05/28/2017 17:24    Derek Hays Brain Wo Contrast   Result Date: 05/28/2017 CLINICAL DATA:  Stroke.  Endovascular clot retrieval 05/27/2017 EXAM: MRI HEAD WITHOUT CONTRAST MRA HEAD WITHOUT CONTRAST TECHNIQUE: Multiplanar, multiecho pulse sequences of the brain and surrounding structures were obtained without intravenous contrast. Angiographic images of the head were obtained using MRA technique without contrast. COMPARISON:  CTA 05/27/2017 FINDINGS: MRI HEAD FINDINGS Brain: Right MCA infarct with restricted diffusion in the right insula and right parietal lobe. Smaller area of acute infarct in the right frontal lobe. Small areas of acute infarct in the high right parietal lobe. No associated hemorrhage Restricted diffusion in the left parietal cortex compatible with a small area of acute infarct. Restricted diffusion in the left occipital lobe correlating to hypodensity on CT. This is compatible with subacute infarct. Negative for hemorrhage. 1 cm extra-axial mass left parietal convexity compatible with meningioma. No mass-effect Mild atrophy.  Negative for hydrocephalus. Vascular: Normal arterial flow voids Skull and upper cervical spine: Negative Sinuses/Orbits: Sinuses clear.  Bilateral  cataract removal. Other: None MRA HEAD FINDINGS Image quality degraded by motion.  Is Both vertebral arteries patent to the basilar. Basilar widely patent. PICA patent bilaterally. Posterior cerebral arteries patent bilaterally. Right internal carotid artery widely patent. Right anterior cerebral artery widely patent. Mild to moderate stenosis distal right middle cerebral artery. Moderate to severe stenosis proximal right M2 branch. This vessel was thrombosed previously and appears patent with a significant stenosis which may be due to underlying atherosclerotic disease. Left internal carotid artery patent. Left anterior and middle cerebral arteries patent bilaterally. Negative for aneurysm IMPRESSION: Acute right MCA infarct involving the right insula and frontal parietal lobe. Negative for hemorrhage. Small acute or subacute infarct left parietal lobe. Subacute infarct left occipital lobe. Findings suggest emboli. Status post revascularization of right middle cerebral artery occlusion. There remains moderate to severe stenosis proximal right M2 segment which may be due to underlying atherosclerotic disease. Electronically Signed   By: Franchot Gallo M.D.   On: 05/28/2017 17:24    [place]   Result Date: 05/29/2017 INDICATION: Acute onset of left-sided hemiplegia, right gaze deviation and dysarthria. Occluded right middle cerebral artery M1 segment on CT angiogram. EXAM: 1. EMERGENT LARGE VESSEL OCCLUSION THROMBOLYSIS (anterior 2. CIRCULATION) COMPARISON:  Head CT angiogram of the head and  neck of 05/27/2017. MEDICATIONS: Ancef 2 g IV was administered within 1 hour of the procedure. ANESTHESIA/SEDATION: General anesthesia. CONTRAST:  Isovue 300 approximately 130 cc. FLUOROSCOPY TIME:  Fluoroscopy Time: 124 minutes 18 seconds (5652 mGy). COMPLICATIONS: None immediate. TECHNIQUE: Following a full explanation of the procedure along with the potential associated complications, an informed witnessed consent  was obtained. The risks of intracranial hemorrhage of 10%, worsening neurological deficit, ventilator dependency, death and inability to revascularize were all reviewed in detail with the patient's family. The patient was then put under general anesthesia by the Department of Anesthesiology at [facility]. The right groin was prepped and draped in the usual sterile fashion. Thereafter using modified Seldinger technique, transfemoral access into the right common femoral artery was obtained without difficulty. Over a 0.035 inch guidewire a 5 French Pinnacle sheath was inserted. Through this, and also over a 0.035 inch guidewire a 5 [place] JB 1 catheter was advanced to the aortic arch region and selectively positioned in the innominate artery and the right common carotid artery. FINDINGS: The innominate artery injection demonstrates wide patency of the right subclavian artery and the origin of the right common carotid artery. The origin of the right vertebral artery is normal with contrast noted ascending in the vessel to the cranial skull base. The right common carotid arteriogram demonstrates the right external carotid artery and its major branches to be widely patent. The right internal carotid artery has a smooth shallow plaque along the posterior wall of the bulb with no significant stenosis by the NASCET criteria. There is moderate tortuosity of the proximal 1/3 of the right internal carotid artery. More distally, the vessel is seen to opacify normally to the cranial skull base. The petrous, cavernous and supraclinoid segments demonstrate wide patency. The right anterior cerebral artery opacifies into the capillary and venous phases. Prompt opacification via the anterior communicating artery of the left anterior cerebral artery A2 segment and distally is noted. Complete angiographic occlusion is seen of the distal 1/3 of the right middle cerebral artery with slow flow in the hypoplastic anterior temporal  branch. PROCEDURE: ENDOVASCULAR REVASCULARIZATION OF THE OCCLUDED RIGHT MIDDLE CEREBRAL ARTERY WITH MECHANICAL THROMBECTOMY USING 4 PASSES WITH A SOLITAIRE FR RETRIEVAL DEVICE, AND 1 PASS WITH THE TREVO PROVUE RETRIEVAL DEVICE WITH COMPLETE REVASCULARIZATION AND ACHIEVING A TICI 2B REVASCULARIZATION The diagnostic JB 1 catheter in the right common carotid artery was exchanged over a 0.035 inch 300 cm Rosen exchange guidewire for an 8 French 55 cm Brite tip neurovascular sheath using biplane roadmap technique and constant fluoroscopic guidance. Good aspiration obtained from the hub of the 8 French neurovascular sheath. This was then connected to continuous heparinized saline infusion. Over the [employer] guidewire, an 8 [place] 85 cm FlowGate balloon guide catheter which had been prepped with 50% contrast and 50% heparinized saline infusion was advanced and positioned in the proximal right internal carotid artery. The guidewire was removed. Good aspiration obtained from the hub of the of FlowGate guide catheter. A gentle contrast injection demonstrated no evidence of spasms, dissections or of intraluminal filling defects. A combination of an 064 Ace intermediary guide catheter inside of which was a Trevo ProVue 021 microcatheter combination was then advanced over a 0.014 inch Softip Synchro micro guidewire to the distal end of the right internal carotid artery supraclinoid segment. The micro guidewire and the microcatheter were advanced into the M1 segment. However, further advancement of the microcatheter and micro guidewire was met with significant resistance and herniation of  the proximal platform secondary to the severe tortuosity of the distal right internal carotid artery in the neck. This combination was then replaced with a combination of a [place] 5 [place] 132 cm guide catheter inside of which was the [employer] 021 microcatheter. This combination was again advanced to the right internal carotid artery  supraclinoid segment. The micro guidewire was then advanced using a torque device through the occluded right middle cerebral artery into a superior division. This was then followed by the microcatheter to the M2 M3 region of the superior division. The guidewire was removed. Significant resistance to aspiration was noted. The microcatheter was then gently retrieved more proximally until there was free aspiration of blood at the hub of the microcatheter. A gentle contrast injection demonstrated slow antegrade flow at the tip of the microcatheter. At this time, a 4 mm x 40 mm Solitaire FR retrieval device was then advanced to the distal end of the microcatheter. The landing zones of the retrieval device were then defined. The O ring on the delivery microcatheter was then loosened. With slight forward gentle traction with the right hand on the delivery micro guidewire, with the left hand the delivery microcatheter was retrieved unsheathing the distal and the proximal portion of the retrieval device. A control arteriogram performed through the [facility] guide catheter in the supraclinoid right ICA demonstrated no significantly revascularization through the retriever. The balloon of the [facility] guide catheter was then inflated in the right internal carotid artery for proximal flow arrest. The proximal portion of the retrieval device was captured into the microcatheter. Thereafter, with vacuum aspiration being performed at the hub of the [facility] guide catheter using a Penumbra aspiration device, the combination was gently retrieved and removed. Aspiration was continued also at the hub of the [place] guide catheter. Clot was noted in the interstices of the retrieval device. Constant aspiration was continued as the balloon of the [facility] guide catheter was deflated. A control arteriogram performed through the [facility] guide catheter in the right internal carotid artery demonstrated no significant improvement in the  occluded right middle cerebral artery. However, there was improved flow in the anterior temporal branch. This was then followed by a second pass with the above combination. Again this time the inferior division of the right middle cerebral artery was entered with deployment of the retrieval device starting in the M2 M3 region and extending the entirety of the occluded inferior division. Again with proximal flow arrest, with the proximal retrieval device being captured into the microcatheter, the combination was again retrieved and removed as constant aspiration using a Penumbra aspiration vacuum device was used constantly. Again noted at this time was a few specks of clot within the interstices of the retrieval device. Proximal flow arrest was then relieved with free back bleed noted at the hub of the 8 [place] FlowGate guide catheter. A gentle contrast injection through the right internal carotid artery demonstrated mild improvement with improved opacification of the right middle cerebral artery distally. However, the inferior and superior divisions remained occluded. A third pass was then made using a Trevo ProVue 4 mm x 30 mm retrieval device via the same combination of microcatheter and the [place] guide catheter. The inferior division was again entered to the M2 M3 regions with the microcatheter. Thereafter the Trevo ProVue device being deployed as mentioned earlier technically. Again a control arteriogram performed through the [place] guide catheter in the right internal carotid artery revealed no significant improvement or  revascularization of the occluded right middle cerebral artery inferior division. However, with proximal flow arrest, and constant aspiration using a Penumbra vacuum device, the combination was retrieved and removed. At this time, the aspirate contained a few specks of clot. However, no gross clot was noted. A control arteriogram performed through the 8 [place] FlowGate guide  catheter, however, demonstrated no trickle flow through the inferior division of the right middle cerebral artery. However, there continued to be clot noted in the superior division and also proximal to this. A fourth pass was made with the Solitaire 4 mm x 40 mm retrieval device this time using a 5 French 115 cm Catalyst 5 [place] guide catheter inside of which was a 021 ProVue microcatheter. The superior division was now entered in the M2 M3 region. After ascertaining safe position of the tip of the microcatheter the Solitaire retrieval device was deployed as mentioned technically above. With proximal flow arrest, and with constant aspiration with the Penumbra aspiration device, this combination was then retrieved and removed as aspiration was continued. Aspiration was continued as the balloon was deflated in the right internal carotid artery. Free back bleed was noted at the hub of the rib 8 [place] FlowGate guide catheter. A gentle contrast injection now demonstrated improved flow in the right middle cerebral M1 segment and also the superior division. The inferior division continued to be occluded. This prompted a fifth pass with the Solitaire FR 4 mm x 40 mm retrieval device again being advanced into the distal end of the microcatheter 021 Trevo ProVue which had been positioned in the M2 M3 region of the inferior division. After adequate free aspiration at the hub of the microcatheter, the retrieval device was advanced and positioned as described above. Again with proximal flow arrest, the right internal carotid artery, the combination of the retrieval device, and the microcatheter and the 5 French 115 cm Catalyst guide catheter was retrieved and removed as constant aspiration was applied using the Penumbra aspiration device. Aspiration was continued as the arrest was reversed by deflating the balloon in the right internal carotid artery. A control arteriogram performed after getting free back bleed at the hub of  the guide catheter demonstrated now opacification of the superior and the inferior divisions with a TICI 2b in the superior and inferior divisions. Significantly improved and attenuated secondary to intracranial arteriosclerosis. At least 2 aliquots of 25 mcg of nitroglycerin were given intra-arterially to ensure reversal of any vasospasm contributing to the narrowing of these vessels. There was modest improvement in the caliber above the superior and the inferior divisions. A final control arteriogram performed through the 5 [place] FlowGate guide catheter in the right internal carotid artery demonstrated continued free flow through the right middle cerebral artery and the right anterior cerebral artery. A blush of contrast was noted overlying the right peri-insular sylvian triangle. There was no mass-effect on the major vessels noted. No evidence of angiographic extravasation was noted. Following the procedure, the patient's blood pressure, neurological status and heart rate appeared stable. The combination of the retrieval device, and the [facility] guide catheter were then retrieved into the abdominal aorta and exchanged over a J-tip guidewire for an 8 [place] Pinnacle sheath. This in turn was then removed with the successful application of an 8 [place] Exoseal closure device. Following this, complete hemostasis was achieved. The right groin appeared soft without evidence of hematoma or bleeding. The distal pulses remained palpable in the dorsalis pedis, and posterior tibial regions. The patient underwent a  Dyna CT of the brain which revealed gryiform hyperdensity over the right anterior frontal lobe operculum region with possibly of mild adjacent subarachnoid hemorrhage. There was no mass effect or midline shift. The ventricles appeared midline and symmetrical. The patient was then extubated without difficulty. Upon recovery the patient demonstrated no change neurologically compared to prior to the procedure. He was  able to obey simple commands. However, he continued to have significant weakness in the left upper and left lower extremities. He was then transported to the neuro ICU for further management. IMPRESSION: Status post endovascular revascularization of occluded right middle cerebral artery with 5 passes with retrieval devices primarily the Solitaire FR 4 mm x 40 mm retrieval device, and once with the Trevo ProVue 4 mm x 30 mm retrieval device achieving a TICI 2b reperfusion. Underline narrowing and attenuation of the M2 M3 branches of the right middle cerebral artery suggestive of intracranial arteriosclerosis. Dyna CT of the brain demonstrating gyriform hyperdensity in the anterior operculum right frontal region with suspicion of mild subarachnoid hemorrhage with contrast in the perisylvian triangle. PLAN: The patient transported to neuro ICU for further stroke management. Electronically Signed   By: Luanne Bras M.D.   On: 05/28/2017 12:14    Ct Cerebral Perfusion W Contrast   Result Date: 05/27/2017 CLINICAL DATA:  Code stroke.  Left-sided deficits. EXAM: CT ANGIOGRAPHY HEAD AND NECK CT PERFUSION BRAIN TECHNIQUE: Multidetector CT imaging of the head and neck was performed using the standard protocol during bolus administration of intravenous contrast. Multiplanar CT image reconstructions and MIPs were obtained to evaluate the vascular anatomy. Carotid stenosis measurements (when applicable) are obtained utilizing NASCET criteria, using the distal internal carotid diameter as the denominator. Multiphase CT imaging of the brain was performed following IV bolus contrast injection. Subsequent parametric perfusion maps were calculated using RAPID software. CONTRAST:  90 mL Isovue 370 COMPARISON:  Head CT 05/27/2017 FINDINGS: CTA NECK FINDINGS Aortic arch: There is moderate calcific atherosclerosis of the aortic arch. There is no aneurysm, dissection or hemodynamically significant stenosis of the visualized  ascending aorta and aortic arch. Conventional 3 vessel aortic branching pattern. The visualized proximal subclavian arteries are normal. Right carotid system: The right common carotid origin is widely patent. There is no common carotid or internal carotid artery dissection or aneurysm. Atherosclerotic calcification at the carotid bifurcation without hemodynamically significant stenosis. Left carotid system: The left common carotid origin is widely patent. There is no common carotid or internal carotid artery dissection or aneurysm. Atherosclerotic calcification at the carotid bifurcation without hemodynamically significant stenosis. Vertebral arteries: The vertebral system is left dominant. Both vertebral artery origins are normal. Both vertebral arteries are normal to their confluence with the basilar artery. Skeleton: Multilevel cervical facet arthrosis. No bony spinal canal stenosis. No skull lesion. Other neck: The nasopharynx is clear. The oropharynx and hypopharynx are normal. The epiglottis is normal. The supraglottic larynx, glottis and subglottic larynx are normal. No retropharyngeal collection. The parapharyngeal spaces are preserved. The parotid and submandibular glands are normal. No sialolithiasis or salivary ductal dilatation. The thyroid gland is normal. There is no cervical lymphadenopathy. Upper chest: Incompletely visualized right pleural effusion. Review of the MIP images confirms the above findings CTA HEAD FINDINGS Anterior circulation: --Intracranial internal carotid arteries: Normal. --Anterior cerebral arteries: The right middle cerebral artery M 1 segment is occluded at its distal aspect. There is very poor collateralization in the right MCA territory. The left middle cerebral artery is normal. --Middle cerebral arteries: Normal. --Posterior communicating arteries:  Absent bilaterally. Posterior circulation: --Posterior cerebral arteries: Normal. --Superior cerebellar arteries: Normal.  --Basilar artery: Normal. --Anterior inferior cerebellar arteries: Normal. --Posterior inferior cerebellar arteries: Normal. Venous sinuses: As permitted by contrast timing, patent. Anatomic variants: None Delayed phase: Not performed. 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 Review of the MIP images confirms the above findings. CT Brain Perfusion Findings: CBF (<30%) Volume: 35m Perfusion (Tmax>6.0s) volume: 168mMismatch Volume: 10875mnfarction Location:Right MCA territory IMPRESSION: 1. Emergent large vessel occlusion of the distal M1 segment of the right middle cerebral artery. Very poor collateralization throughout the right MCA territory. 2. Right MCA territory infarct of 52 mL with 108 mL ischemic penumbra. 3. No midline shift or other mass effect.  No acute hemorrhage. 4. Hila convexity meningioma. 5. Carotid and aortic atherosclerosis (ICD10-I70.0) without hemodynamically significant stenosis of the carotid or vertebral arteries. These results were communicated to Dr. EriKerney Elbe 6:27 pm on 05/27/2017 by text page via the [facility] system. I am currently awaiting confirmatory telephone call. Electronically Signed   By: KevUlyses JarredD.   On: 05/27/2017 18:33    Dg Chest Port 1 View   Result Date: 05/29/2017 CLINICAL DATA:  CHF, CVA EXAM: PORTABLE CHEST 1 VIEW COMPARISON:  Portable chest x-ray of May 28, 2017 FINDINGS: The lungs are adequately inflated. The interstitial markings remain increased greatest on the right. There is partial obscuration of the hemidiaphragms today likely reflecting posterior layering pleural effusions. The cardiac silhouette remains enlarged. The pulmonary vascularity remains engorged but is slightly more distinct today. There is dense calcification in the wall of the thoracic aorta. The ICD is in stable position. There is multilevel degenerative disc disease of the thoracic spine. IMPRESSION: Fairly stable appearance of the chest with exception of  increased small bilateral pleural effusions. CHF with mild pulmonary vascular congestion greatest on the right. Thoracic aortic atherosclerosis. Electronically Signed   By: David  JorMartiniqueD.   On: 05/29/2017 07:54    Dg Chest Port 1 View   Result Date: 05/28/2017 CLINICAL DATA:  86 3ar old and stroke. EXAM: PORTABLE CHEST 1 VIEW COMPARISON:  02/21/2010 FINDINGS: Left dual-chamber cardiac pacemaker. The pacemaker leads are incompletely evaluated. Patient is rotated towards the right. Hazy densities in the right lung. Heart size is within normal limits. Negative for pneumothorax. Atherosclerotic calcifications at the aortic arch. IMPRESSION: Hazy densities in the right lung. Findings are nonspecific but could be related to mild asymmetric edema and recommend continued follow-up. Electronically Signed   By: AdaMarkus DaftD.   On: 05/28/2017 09:06    Vas Us [place] Pseudoaneurysm   Result Date: 05/28/2017  ARTERIAL PSEUDOANEURYSM  Exam: Right groin History: S/p catheterization.  Examination Guidelines: A complete evaluation includes B-mode imaging, spectral doppler, color doppler, and power doppler as needed of all accessible portions of each vessel. Bilateral testing is considered an integral part of a complete examination. Limited examinations for reoccurring indications may be performed as noted. +------------+----------+---------+------+----------+ Right DuplexPSV (cm/s)Waveform PlaqueComment(s) +------------+----------+---------+------+----------+ CFA                   triphasic                 +------------+----------+---------+------+----------+  Final Interpretation No evidence of pseudoaneurysm, AVF, or DVT in the right groin.  VanHarold Barbanectronically signed by VanHarold Barban 05/28/2017 at 12:82:80:03.   --------------------------------------------------------------------------------   Final    Ir Percutaneous Art Thrombectomy/infusion Intracranial Inc Diag Angio   Result Date:  05/29/2017 INDICATION:  Acute onset of left-sided hemiplegia, right gaze deviation and dysarthria. Occluded right middle cerebral artery M1 segment on CT angiogram. EXAM: 1. EMERGENT LARGE VESSEL OCCLUSION THROMBOLYSIS (anterior 2. CIRCULATION) COMPARISON:  Head CT angiogram of the head and neck of 05/27/2017. MEDICATIONS: Ancef 2 g IV was administered within 1 hour of the procedure. ANESTHESIA/SEDATION: General anesthesia. CONTRAST:  Isovue 300 approximately 130 cc. FLUOROSCOPY TIME:  Fluoroscopy Time: 124 minutes 18 seconds (5652 mGy). COMPLICATIONS: None immediate. TECHNIQUE: Following a full explanation of the procedure along with the potential associated complications, an informed witnessed consent was obtained. The risks of intracranial hemorrhage of 10%, worsening neurological deficit, ventilator dependency, death and inability to revascularize were all reviewed in detail with the patient's family. The patient was then put under general anesthesia by the Department of Anesthesiology at [facility]. The right groin was prepped and draped in the usual sterile fashion. Thereafter using modified Seldinger technique, transfemoral access into the right common femoral artery was obtained without difficulty. Over a 0.035 inch guidewire a 5 French Pinnacle sheath was inserted. Through this, and also over a 0.035 inch guidewire a 5 [place] JB 1 catheter was advanced to the aortic arch region and selectively positioned in the innominate artery and the right common carotid artery. FINDINGS: The innominate artery injection demonstrates wide patency of the right subclavian artery and the origin of the right common carotid artery. The origin of the right vertebral artery is normal with contrast noted ascending in the vessel to the cranial skull base. The right common carotid arteriogram demonstrates the right external carotid artery and its major branches to be widely patent. The right internal carotid artery has a  smooth shallow plaque along the posterior wall of the bulb with no significant stenosis by the NASCET criteria. There is moderate tortuosity of the proximal 1/3 of the right internal carotid artery. More distally, the vessel is seen to opacify normally to the cranial skull base. The petrous, cavernous and supraclinoid segments demonstrate wide patency. The right anterior cerebral artery opacifies into the capillary and venous phases. Prompt opacification via the anterior communicating artery of the left anterior cerebral artery A2 segment and distally is noted. Complete angiographic occlusion is seen of the distal 1/3 of the right middle cerebral artery with slow flow in the hypoplastic anterior temporal branch. PROCEDURE: ENDOVASCULAR REVASCULARIZATION OF THE OCCLUDED RIGHT MIDDLE CEREBRAL ARTERY WITH MECHANICAL THROMBECTOMY USING 4 PASSES WITH A SOLITAIRE FR RETRIEVAL DEVICE, AND 1 PASS WITH THE TREVO PROVUE RETRIEVAL DEVICE WITH COMPLETE REVASCULARIZATION AND ACHIEVING A TICI 2B REVASCULARIZATION The diagnostic JB 1 catheter in the right common carotid artery was exchanged over a 0.035 inch 300 cm Rosen exchange guidewire for an 8 French 55 cm Brite tip neurovascular sheath using biplane roadmap technique and constant fluoroscopic guidance. Good aspiration obtained from the hub of the 8 French neurovascular sheath. This was then connected to continuous heparinized saline infusion. Over the [employer] guidewire, an 8 [place] 85 cm FlowGate balloon guide catheter which had been prepped with 50% contrast and 50% heparinized saline infusion was advanced and positioned in the proximal right internal carotid artery. The guidewire was removed. Good aspiration obtained from the hub of the of FlowGate guide catheter. A gentle contrast injection demonstrated no evidence of spasms, dissections or of intraluminal filling defects. A combination of an 064 Ace intermediary guide catheter inside of which was a Trevo ProVue  021 microcatheter combination was then advanced over a 0.014 inch Softip Synchro micro guidewire to the  distal end of the right internal carotid artery supraclinoid segment. The micro guidewire and the microcatheter were advanced into the M1 segment. However, further advancement of the microcatheter and micro guidewire was met with significant resistance and herniation of the proximal platform secondary to the severe tortuosity of the distal right internal carotid artery in the neck. This combination was then replaced with a combination of a [place] 5 [place] 132 cm guide catheter inside of which was the [employer] 021 microcatheter. This combination was again advanced to the right internal carotid artery supraclinoid segment. The micro guidewire was then advanced using a torque device through the occluded right middle cerebral artery into a superior division. This was then followed by the microcatheter to the M2 M3 region of the superior division. The guidewire was removed. Significant resistance to aspiration was noted. The microcatheter was then gently retrieved more proximally until there was free aspiration of blood at the hub of the microcatheter. A gentle contrast injection demonstrated slow antegrade flow at the tip of the microcatheter. At this time, a 4 mm x 40 mm Solitaire FR retrieval device was then advanced to the distal end of the microcatheter. The landing zones of the retrieval device were then defined. The O ring on the delivery microcatheter was then loosened. With slight forward gentle traction with the right hand on the delivery micro guidewire, with the left hand the delivery microcatheter was retrieved unsheathing the distal and the proximal portion of the retrieval device. A control arteriogram performed through the [facility] guide catheter in the supraclinoid right ICA demonstrated no significantly revascularization through the retriever. The balloon of the [facility] guide catheter was then  inflated in the right internal carotid artery for proximal flow arrest. The proximal portion of the retrieval device was captured into the microcatheter. Thereafter, with vacuum aspiration being performed at the hub of the [facility] guide catheter using a Penumbra aspiration device, the combination was gently retrieved and removed. Aspiration was continued also at the hub of the [place] guide catheter. Clot was noted in the interstices of the retrieval device. Constant aspiration was continued as the balloon of the [facility] guide catheter was deflated. A control arteriogram performed through the [facility] guide catheter in the right internal carotid artery demonstrated no significant improvement in the occluded right middle cerebral artery. However, there was improved flow in the anterior temporal branch. This was then followed by a second pass with the above combination. Again this time the inferior division of the right middle cerebral artery was entered with deployment of the retrieval device starting in the M2 M3 region and extending the entirety of the occluded inferior division. Again with proximal flow arrest, with the proximal retrieval device being captured into the microcatheter, the combination was again retrieved and removed as constant aspiration using a Penumbra aspiration vacuum device was used constantly. Again noted at this time was a few specks of clot within the interstices of the retrieval device. Proximal flow arrest was then relieved with free back bleed noted at the hub of the 8 [place] FlowGate guide catheter. A gentle contrast injection through the right internal carotid artery demonstrated mild improvement with improved opacification of the right middle cerebral artery distally. However, the inferior and superior divisions remained occluded. A third pass was then made using a Trevo ProVue 4 mm x 30 mm retrieval device via the same combination of microcatheter and the [place]  guide catheter. The inferior division was again entered to the M2  M3 regions with the microcatheter. Thereafter the Trevo ProVue device being deployed as mentioned earlier technically. Again a control arteriogram performed through the [place] guide catheter in the right internal carotid artery revealed no significant improvement or revascularization of the occluded right middle cerebral artery inferior division. However, with proximal flow arrest, and constant aspiration using a Penumbra vacuum device, the combination was retrieved and removed. At this time, the aspirate contained a few specks of clot. However, no gross clot was noted. A control arteriogram performed through the 8 [place] FlowGate guide catheter, however, demonstrated no trickle flow through the inferior division of the right middle cerebral artery. However, there continued to be clot noted in the superior division and also proximal to this. A fourth pass was made with the Solitaire 4 mm x 40 mm retrieval device this time using a 5 French 115 cm Catalyst 5 [place] guide catheter inside of which was a 021 ProVue microcatheter. The superior division was now entered in the M2 M3 region. After ascertaining safe position of the tip of the microcatheter the Solitaire retrieval device was deployed as mentioned technically above. With proximal flow arrest, and with constant aspiration with the Penumbra aspiration device, this combination was then retrieved and removed as aspiration was continued. Aspiration was continued as the balloon was deflated in the right internal carotid artery. Free back bleed was noted at the hub of the rib 8 [place] FlowGate guide catheter. A gentle contrast injection now demonstrated improved flow in the right middle cerebral M1 segment and also the superior division. The inferior division continued to be occluded. This prompted a fifth pass with the Solitaire FR 4 mm x 40 mm retrieval device again being advanced into the  distal end of the microcatheter 021 Trevo ProVue which had been positioned in the M2 M3 region of the inferior division. After adequate free aspiration at the hub of the microcatheter, the retrieval device was advanced and positioned as described above. Again with proximal flow arrest, the right internal carotid artery, the combination of the retrieval device, and the microcatheter and the 5 French 115 cm Catalyst guide catheter was retrieved and removed as constant aspiration was applied using the Penumbra aspiration device. Aspiration was continued as the arrest was reversed by deflating the balloon in the right internal carotid artery. A control arteriogram performed after getting free back bleed at the hub of the guide catheter demonstrated now opacification of the superior and the inferior divisions with a TICI 2b in the superior and inferior divisions. Significantly improved and attenuated secondary to intracranial arteriosclerosis. At least 2 aliquots of 25 mcg of nitroglycerin were given intra-arterially to ensure reversal of any vasospasm contributing to the narrowing of these vessels. There was modest improvement in the caliber above the superior and the inferior divisions. A final control arteriogram performed through the 5 [place] FlowGate guide catheter in the right internal carotid artery demonstrated continued free flow through the right middle cerebral artery and the right anterior cerebral artery. A blush of contrast was noted overlying the right peri-insular sylvian triangle. There was no mass-effect on the major vessels noted. No evidence of angiographic extravasation was noted. Following the procedure, the patient's blood pressure, neurological status and heart rate appeared stable. The combination of the retrieval device, and the [facility] guide catheter were then retrieved into the abdominal aorta and exchanged over a J-tip guidewire for an 8 [place] Pinnacle sheath. This in turn was then removed  with the successful application of an  8 [place] Exoseal closure device. Following this, complete hemostasis was achieved. The right groin appeared soft without evidence of hematoma or bleeding. The distal pulses remained palpable in the dorsalis pedis, and posterior tibial regions. The patient underwent a Dyna CT of the brain which revealed gryiform hyperdensity over the right anterior frontal lobe operculum region with possibly of mild adjacent subarachnoid hemorrhage. There was no mass effect or midline shift. The ventricles appeared midline and symmetrical. The patient was then extubated without difficulty. Upon recovery the patient demonstrated no change neurologically compared to prior to the procedure. He was able to obey simple commands. However, he continued to have significant weakness in the left upper and left lower extremities. He was then transported to the neuro ICU for further management. IMPRESSION: Status post endovascular revascularization of occluded right middle cerebral artery with 5 passes with retrieval devices primarily the Solitaire FR 4 mm x 40 mm retrieval device, and once with the Trevo ProVue 4 mm x 30 mm retrieval device achieving a TICI 2b reperfusion. Underline narrowing and attenuation of the M2 M3 branches of the right middle cerebral artery suggestive of intracranial arteriosclerosis. Dyna CT of the brain demonstrating gyriform hyperdensity in the anterior operculum right frontal region with suspicion of mild subarachnoid hemorrhage with contrast in the perisylvian triangle. PLAN: The patient transported to neuro ICU for further stroke management. Electronically Signed   By: Luanne Bras M.D.   On: 05/28/2017 12:14    Ct Head Code Stroke Wo Contrast   Result Date: 05/27/2017 CLINICAL DATA:  Code stroke. 84 year old male with left side neurologic deficits. Last seen normal 1630 hrs. EXAM: CT HEAD WITHOUT CONTRAST TECHNIQUE: Contiguous axial images were obtained from the  base of the skull through the vertex without intravenous contrast. COMPARISON:  No prior head CT. FINDINGS: Brain: Confluent hypodensity in the right inferior frontal gyrus near the operculum appears mostly confined to the white matter. No other right MCA territory cytotoxic edema is identified. There is a cytotoxic edema in the medial and posterior left occipital lobe with no regional mass effect. Other scattered white matter hypodensity, but no other cytotoxic edema identified. No acute intracranial hemorrhage identified. No ventriculomegaly. No intracranial mass effect. There does appear to be a small area of chronic cortical encephalomalacia in the left posterior frontal lobe (sagittal image 49). Vascular: Hyperdense right MCA and right MCA bifurcation best demonstrated on coronal image 29 and sagittal image 19. Dominant appearing distal left vertebral artery. Skull: No acute osseous abnormality identified. Sinuses/Orbits: Clear. Other: No acute findings. ASPECTS [facility] Stroke Program Early CT Score) - Ganglionic level infarction (caudate, lentiform nuclei, internal capsule, insula, M1-M3 cortex): 6 (abnormal right M1 segment) - Supraganglionic infarction (M4-M6 cortex): 3 Total score (0-10 with 10 being normal): 9 IMPRESSION: 1. Positive for hyperdense right MCA. Hypodensity in the right frontal lobe appears mostly confined to the white matter. ASPECTS is 9. No hemorrhage or mass effect. 2. Subacute appearing left PCA territory infarct with no hemorrhage or mass effect. 3. These results were communicated to Dr. Cheral Marker at 5:53 pmon 2/11/2019by text page via the [facility] messaging system. Electronically Signed   By: Genevie Ann M.D.   On: 05/27/2017 17:54             Medical Problem List and Plan: 1.  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 Plan to begin ELIQUIS in 10 days from 05/30/2017 5 mg twice a day due to size of infarction. No need to reimage. Discontinue Lovenox and aspirin once ELIQUIS resumed  2.  DVT Prophylaxis/Anticoagulation: Subcutaneous Lovenox. Monitor platelet counts in any signs of bleeding 3. Pain Management: Lyrica 100 mg twice a day, Tylenol as needed 4. Mood: Provide emotional support 5. Neuropsych: This patient is capable of making decisions on his own behalf. 6. Skin/Wound Care: Routine skin checks 7. Fluids/Electrolytes/Nutrition: Routine I&O's with follow-up chemistries 8. Dysphagia. Dysphagia #1 nectar liquids. Follow-up speech therapy 9. Hypertension. Tenormin 50 mg daily. Monitor with increased mobility 10. Atrial fibrillation with history of pacemaker. Cardiac rate controlled. Patient to follow-up with [place] [facility] services [facility]. 11. Hypothyroidism. Synthroid 12. Hyperlipidemia. Zocor     Post Admission Physician Evaluation: 1. Functional deficits secondary  to left hemiparesis, left neglect, left hemisensory deficit secondary to right MCA infarct. 2. Patient is admitted to receive collaborative, interdisciplinary care between the physiatrist, rehab nursing staff, and therapy team. 3. Patient's level of medical complexity and substantial therapy needs in context of that medical necessity cannot be provided at a lesser intensity of care such as a SNF. 4. Patient has experienced substantial functional loss from his/her baseline which was documented above under the "Functional History" and "Functional Status" headings.  Judging by the patient's diagnosis, physical exam, and functional history, the patient has potential for functional progress which will result in measurable gains while on inpatient rehab.  These gains will be of substantial and practical use upon discharge  in facilitating mobility and self-care at the household  level. 5. Physiatrist will provide 24 hour management of medical needs as well as oversight of the therapy plan/treatment and provide guidance as appropriate regarding the interaction of the two. 6. The Preadmission Screening has been reviewed and patient status is unchanged unless otherwise stated above. 7. 24 hour rehab nursing will assist with bladder management, bowel management, safety, skin/wound care, disease management, medication administration, pain management and patient education  and help integrate therapy concepts, techniques,education, etc. 8. PT will assess and treat for/with: pre gait, gait training, endurance , safety, equipment, neuromuscular re education.   Goals are: Min/mod assist. 9. OT will assess and treat for/with: ADLs, Cognitive perceptual skills, Neuromuscular re education, safety, endurance, equipment.   Goals are: min/ModA. Therapy may proceed with showering this patient. 10. SLP will assess and treat for/with: Swallowing, left neglect, right brain language dysfunction, cognitive deficits.  Goals are: Increased attention to the left, safe and adequate p.o. intake. 11. Case Management and Social Worker will assess and treat for psychological issues and discharge planning. 12. Team conference will be held weekly to assess progress toward goals and to determine barriers to discharge. 13. Patient will receive at least 3 hours of therapy per day at least 5 days per week. 14. ELOS: 20-22 days      15. Prognosis:  fair         Derek Hays M.D. [facility] FAAPM&R (Sports Med, Neuromuscular Med) Diplomate Am Board of Electrodiagnostic Med  Derek Hays 05/29/2017

## 2017-05-31 ENCOUNTER — Inpatient Hospital Stay (HOSPITAL_COMMUNITY): Payer: TRICARE For Life (TFL) | Admitting: Physical Therapy

## 2017-05-31 ENCOUNTER — Inpatient Hospital Stay (HOSPITAL_COMMUNITY): Payer: TRICARE For Life (TFL) | Admitting: Occupational Therapy

## 2017-05-31 ENCOUNTER — Inpatient Hospital Stay (HOSPITAL_COMMUNITY): Payer: Medicare Other | Admitting: Speech Pathology

## 2017-05-31 DIAGNOSIS — I69319 Unspecified symptoms and signs involving cognitive functions following cerebral infarction: Secondary | ICD-10-CM

## 2017-05-31 DIAGNOSIS — G8114 Spastic hemiplegia affecting left nondominant side: Secondary | ICD-10-CM

## 2017-05-31 DIAGNOSIS — I639 Cerebral infarction, unspecified: Secondary | ICD-10-CM

## 2017-05-31 LAB — CBC WITH DIFFERENTIAL/PLATELET
BASOS PCT: 1 %
Basophils Absolute: 0 10*3/uL (ref 0.0–0.1)
EOS ABS: 0.3 10*3/uL (ref 0.0–0.7)
Eosinophils Relative: 3 %
HEMATOCRIT: 36.6 % — AB (ref 39.0–52.0)
Hemoglobin: 12.1 g/dL — ABNORMAL LOW (ref 13.0–17.0)
Lymphocytes Relative: 21 %
Lymphs Abs: 1.8 10*3/uL (ref 0.7–4.0)
MCH: 29.8 pg (ref 26.0–34.0)
MCHC: 33.1 g/dL (ref 30.0–36.0)
MCV: 90.1 fL (ref 78.0–100.0)
MONO ABS: 0.8 10*3/uL (ref 0.1–1.0)
MONOS PCT: 10 %
Neutro Abs: 5.4 10*3/uL (ref 1.7–7.7)
Neutrophils Relative %: 65 %
Platelets: 182 10*3/uL (ref 150–400)
RBC: 4.06 MIL/uL — ABNORMAL LOW (ref 4.22–5.81)
RDW: 13.3 % (ref 11.5–15.5)
WBC: 8.2 10*3/uL (ref 4.0–10.5)

## 2017-05-31 LAB — COMPREHENSIVE METABOLIC PANEL
ALBUMIN: 2.9 g/dL — AB (ref 3.5–5.0)
ALT: 12 U/L — ABNORMAL LOW (ref 17–63)
ANION GAP: 9 (ref 5–15)
AST: 24 U/L (ref 15–41)
Alkaline Phosphatase: 68 U/L (ref 38–126)
BILIRUBIN TOTAL: 0.9 mg/dL (ref 0.3–1.2)
BUN: 20 mg/dL (ref 6–20)
CO2: 23 mmol/L (ref 22–32)
Calcium: 8.7 mg/dL — ABNORMAL LOW (ref 8.9–10.3)
Chloride: 106 mmol/L (ref 101–111)
Creatinine, Ser: 1.3 mg/dL — ABNORMAL HIGH (ref 0.61–1.24)
GFR calc non Af Amer: 49 mL/min — ABNORMAL LOW (ref >60)
GFR, EST AFRICAN AMERICAN: 56 mL/min — AB (ref >60)
GLUCOSE: 111 mg/dL — AB (ref 65–99)
POTASSIUM: 3.9 mmol/L (ref 3.5–5.1)
SODIUM: 138 mmol/L (ref 135–145)
TOTAL PROTEIN: 5.4 g/dL — AB (ref 6.5–8.1)

## 2017-05-31 NOTE — Progress Notes (Signed)
Social Work  Social Work Assessment and Plan  Patient Details  Name: Derek Hays MRN: [id number] Date of Birth: 1931/01/13  Today's Date: 05/31/2017  Problem List:  Patient Active Problem List   Diagnosis Date Noted  . 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 Stroke (cerebrum) ([place]) 05/27/2017   Past Medical History:  Past Medical History:  Diagnosis Date  . Atrial fibrillation ([place])   . Hypertension    Past Surgical History:  Past Surgical History:  Procedure Laterality Date  . IR CT HEAD LTD  05/27/2017  . IR PERCUTANEOUS ART THROMBECTOMY/INFUSION INTRACRANIAL INC DIAG ANGIO  05/27/2017  . RADIOLOGY WITH ANESTHESIA N/A 05/27/2017   Procedure: RADIOLOGY WITH ANESTHESIA;  Surgeon: Luanne Bras, MD;  Location: [facility];  Service: Radiology;  Laterality: N/A;   Social History:  reports that  has never smoked. he has never used smokeless tobacco. He reports that he drinks alcohol. He reports that he does not use drugs.  Family / Support Systems Marital Status: Married How Long?: 69 years Patient Roles: Spouse, Parent, Other (Comment)(veteran) Spouse/Significant Other: Rosa [phone] Children: Daughter in [place] and Daughter in [place] Other Supports: recently moved here from [place] 08/2016 Anticipated Caregiver: Wife Ability/Limitations of Caregiver: Wife has health issues and can not physically assist pt at discharge Caregiver Availability: Other (Comment)(supervision level only-may need NHP) Family Dynamics: Pt and wife recently moved from [place] to here last May. Both daughter's are out of town-closest one is in [place]. They have met a few people but not really acclimated here yet. Still don't have a PCP.  Social History Preferred language: English Religion: Christian Cultural Background: No issues Education: Nature conservation officer trained Read: Yes Write: Yes Employment Status: Retired Insurance underwriter Issues: No issues Guardian/Conservator: none-according to MD pt is capable of making his own decisions while here. Wife plans to come daily but has health issues of her own   Abuse/Neglect Abuse/Neglect Assessment Can Be Completed: Yes Physical Abuse: Denies Verbal Abuse: Denies Sexual Abuse: Denies Exploitation of patient/patient's resources: Denies Self-Neglect: Denies  Emotional Status Pt's affect, behavior adn adjustment status: Pt is motivated to do well but has had a severe stroke. He is willing to do his part to improve. He knws his wife can not assist him at discharge physically so he will need to be high level if he wants to go home. He has always been able to be independent and is not used to this role. Recent Psychosocial Issues: Other health issues SDH which resloved itself in 04/2017 took off eliquis wife wonders if best plan to do this Pyschiatric History: No history deferred depression screen due to adjusting to the new unit and exhausted from therapies. Do feel he would benefit from seeing nuero-psych due to aware of his deficits and knows wife can not assist him.  Substance Abuse History: No issues  Patient / Family Perceptions, Expectations & Goals Pt/Family understanding of illness & functional limitations: Pt and wife can explain his stroke and deficits, they talk with the MD and feel they have a good understanding of his treatment plan going forward. Pt has talked with to know the 6-8 months it may take to progress to be able to go home. Premorbid pt/family roles/activities: Husband, father, grandfather, veteran, home owner, retiree, etc Anticipated changes in roles/activities/participation: resume Pt/family expectations/goals: Pt states: " I need to be mobile and able to care for myself, my wife can't help me."  Wife states: " I can try but I need TKR and can barely take care of myself."  [employer]: None Premorbid Home  Care/DME Agencies: None Transportation available at discharge: Wife does Brewing technologist referrals recommended: Neuropsychology, Support group (specify)  Discharge Planning Living Arrangements: Spouse/significant other Support Systems: Spouse/significant other, Children Type of Residence: Private residence Insurance Resources: [employer], Multimedia programmer (specify)([employer]) Museum/gallery curator Resources: Fish farm manager, Other (Comment)(military pension) Financial Screen Referred: No Living Expenses: Own Money Management: Spouse, Patient Does the patient have any problems obtaining your medications?: No Home Management: Both did the home management what wife could not do pt did Patient/Family Preliminary Plans: Depends upon pt's level and how much care he requires. Wife is very limited in what she can do to assist him and it looks like he will require physical care at discharge from rehab. Will await therapy team's evaluations and work on realistic plan for both of them.  Sw Barriers to Discharge: Decreased caregiver support Sw Barriers to Discharge Comments: Wife not physically able to provide care to pt Social Work Anticipated Follow Up Needs: HH/OP, SNF, Support Group  Clinical Impression Pleasant couple who recently moved here to [place] to get away from [place]. Closest child is daughter in [place]. Supports very limited due to recent move. Wife has health issues of her own and can not provide any physical assist to pt at discharge. Will await therapy evaluations and work on a safe plan. Pt is cognitively intact and aware wife can not assist him he reports he was helping her at home prior to admission. Will work on plan. Feel pt would benefit from seeing neuro-psych while here for coping due to this event and the loss of his son in a motorcycle accident  [name] 05/31/2017, 2:23 PM

## 2017-05-31 NOTE — Plan of Care (Signed)
  Progressing RH SKIN INTEGRITY RH STG SKIN FREE OF INFECTION/BREAKDOWN 05/31/2017 0309 - Progressing by Evelena Asa, RN RH STG MAINTAIN SKIN INTEGRITY WITH ASSISTANCE Description STG Maintain Skin Integrity With Assistance. 05/31/2017 0309 - Progressing by Evelena Asa, RN RH STG ABLE TO PERFORM INCISION/WOUND CARE W/ASSISTANCE Description STG Able To Perform Incision/Wound Care With Assistance. 05/31/2017 0309 - Progressing by Evelena Asa, RN RH OTHER STG SKIN INTEGRITY GOALS W/ASSIST Description Other STG Skin Integrity Goals With Assistance. 05/31/2017 0309 - Progressing by Evelena Asa, RN RH SAFETY RH STG ADHERE TO SAFETY PRECAUTIONS W/ASSISTANCE/DEVICE Description STG Adhere to Safety Precautions With Assistance/Device. 05/31/2017 0309 - Progressing by Evelena Asa, RN   Not Progressing RH BOWEL ELIMINATION RH STG MANAGE BOWEL WITH ASSISTANCE Description STG Manage Bowel with Assistance. 05/31/2017 0309 - Not Progressing by Evelena Asa, RN RH STG MANAGE BOWEL W/MEDICATION W/ASSISTANCE Description STG Manage Bowel with Medication with Assistance. 05/31/2017 0309 - Not Progressing by Evelena Asa, RN RH STG MANAGE BOWEL W/EQUIPMENT W/ASSISTANCE Description STG Manage Bowel With Equipment With Assistance 05/31/2017 0309 - Not Progressing by Evelena Asa, RN RH OTHER STG BOWEL ELIMINATION GOALS W/ASSIST Description Other STG Bowel Elimination Goals With Assistance. 05/31/2017 0309 - Not Progressing by Evelena Asa, RN RH BLADDER ELIMINATION RH STG MANAGE BLADDER WITH ASSISTANCE Description STG Manage Bladder With Assistance 05/31/2017 0309 - Not Progressing by Evelena Asa, RN RH STG MANAGE BLADDER WITH MEDICATION WITH ASSISTANCE Description STG Manage Bladder With Medication With Assistance. 05/31/2017 0309 - Not Progressing by Evelena Asa, RN RH STG MANAGE BLADDER WITH EQUIPMENT WITH  ASSISTANCE Description STG Manage Bladder With Equipment With Assistance 05/31/2017 0309 - Not Progressing by Evelena Asa, RN RH OTHER STG BLADDER ELIMINATION GOALS W/ASSIST Description Other STG Bladder Elimination Goals With Assistance 05/31/2017 0309 - Not Progressing by Evelena Asa, RN

## 2017-05-31 NOTE — Evaluation (Signed)
Speech Language Pathology Assessment and Plan  Patient Details  Name: Derek Hays MRN: [id number] Date of Birth: December 29, 1930  SLP Diagnosis: Cognitive Impairments;Dysphagia;Dysarthria  Rehab Potential: Good ELOS: 3 to 4 weeks    Today's Date: 05/31/2017  SLP Individual Time: 1478-2956 and  1330-1400 SLP Individual Time Calculation (min): 30 min and 30 min   Problem List:  Patient Active Problem List   Diagnosis Date Noted  . 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 Stroke (cerebrum) ([place]) 05/27/2017   Past Medical History:  Past Medical History:  Diagnosis Date  . Atrial fibrillation ([place])   . Hypertension    Past Surgical History:  Past Surgical History:  Procedure Laterality Date  . IR CT HEAD LTD  05/27/2017  . IR PERCUTANEOUS ART THROMBECTOMY/INFUSION INTRACRANIAL INC DIAG ANGIO  05/27/2017  . RADIOLOGY WITH ANESTHESIA N/A 05/27/2017   Procedure: RADIOLOGY WITH ANESTHESIA;  Surgeon: Luanne Bras, MD;  Location: [facility];  Service: Radiology;  Laterality: N/A;    Assessment / Plan / Recommendation Clinical Impression Derek Hays a 84 y.o.right handed malewith history of hypertension, atrial fibrillation/pacemaker2016 on Eliquisin the past but discontinuedafter traumatic SDH September 2018 with repeat head CT scan 05/10/2017 showed SDH completely reabsorbedfollowed by Dr Bridgette Habermann [facility]. Per chart review patient lives with spouse. Independent using a rolling walker/cane and still driving. One level home with one step to entry. Presented2/11/2019with acute onset of left-sided weakness as well as slurred speech. CT of the head positive for hyperdense right MCA. No hemorrhage or mass effect. Subacute appearing left PCA territory infarct. CT cerebral perfusion scan emergent large vessel occlusion of the distal M1 segment of the right middle cerebral artery. Underwent revascularization per  interventional radiology.Echocardiogram with ejection fraction 21% grade 2 diastolic dysfunction.MRI/MRA02/03/2018 acute right MCA infarct involving the right insula and frontal parietal lobe. Small acute or subacute infarct left parietal lobe.Marland KitchenMRA status post revascularization of right middle cerebral artery occlusion. There remain moderate to severe stenosis proximal right M2 segment which felt to be due to underlying atherosclerotic disease.Presently on aspirin for CVA prophylaxisand planned is to resume ELIQUISin10 days due to size of infarction.Marland KitchenDysphagia #1 nectar thick liquid diet.Physicaland occupationaltherapy evaluationscompleted 05/28/2017 with recommendations of physical medicine rehabilitation consult.Patient was admitted for a comprehensive rehabilitation program on 05/29/17.   Bedside Swallow Evaluation and Cognitive Linguistic Evaluations were completed on 05/31/17. Pt presents with moderate oropharyngeal dysphagia. Pt's oral phase is c/b decreased labial seal with anterior spillage on left and overall decreased bolus manipulation with puree. Pt with moderate pharyngeal phase dysphagia c/b wet vocal quality and delayed throat clear with nectar by cup possibly indicative of delayed swallow initiation and pharyngeal residue. Pt able to cough and reswallow to clear wetness. Recommend continuing dysphagia 1 with nectar thick liquids and full nursing supervision to control bolus size and promote complete oral clearing. Recommend ST trial thin liquids with consistent use of chin tuck. Pt also presents with mild to moderate cognitive deficits as evidenced by decreased ability to perform basic problem solving, has right gaze preference with decreasedsustained/selective attention and awareness. Skilled ST is required to address the above mentioned deficits, increase functional independence and reduce caregiver burden. Anticipate that pt may require additional skilled services in SNF at time of  discharge.    Skilled Therapeutic Interventions          Skilled treatment session focused on completion of bedside swallow and cognitive linguistic evaluations, see above. Pt required Max  A cues to scan to midline, demonstrate sustained/selective attention, follow compensatory swallow instructions and to indicate any intellectual awareness.    SLP Assessment  Patient will need skilled Speech Lanaguage Pathology Services during CIR admission    Recommendations  SLP Diet Recommendations: Dysphagia 1 (Puree);Nectar Liquid Administration via: Cup Medication Administration: Crushed with puree Supervision: Staff to assist with self feeding;Full supervision/cueing for compensatory strategies Compensations: Slow rate;Small sips/bites;Lingual sweep for clearance of pocketing;Monitor for anterior loss;Clear throat intermittently Postural Changes and/or Swallow Maneuvers: Seated upright 90 degrees Oral Care Recommendations: Oral care BID Patient destination: [facility] (SNF) Follow up Recommendations: 24 hour supervision/assistance;Skilled Nursing facility Equipment Recommended: To be determined    SLP Frequency 3 to 5 out of 7 days   SLP Duration  SLP Intensity  SLP Treatment/Interventions 3 to 4 weeks  Minumum of 1-2 x/day, 30 to 90 minutes  Cognitive remediation/compensation;Cueing hierarchy;Dysphagia/aspiration precaution training;Functional tasks;Patient/family education;Therapeutic Activities;Speech/Language facilitation;Environmental controls;Internal/external aids    Pain    Prior Functioning Cognitive/Linguistic Baseline: Information not available Type of Home: House  Lives With: Spouse Available Help at Discharge: Family;Available 24 hours/day Vocation: Retired  Function:  Eating Eating   Modified Consistency Diet: Yes Eating Assist Level: Set up assist for;Supervision or verbal cues;Helper feeds patient;Helper checks for pocketed food;Helper scoops food on  utensil;Help managing cup/glass;Helper brings food to mouth;More than reasonable amount of time   Eating Set Up Assist For: Opening containers Helper Scoops Food on Utensil: Occasionally Helper Brings Food to Mouth: Occasionally   Cognition Comprehension Comprehension assist level: Understands basic 75 - 89% of the time/ requires cueing 10 - 24% of the time  Expression   Expression assist level: Expresses basic 75 - 89% of the time/requires cueing 10 - 24% of the time. Needs helper to occlude trach/needs to repeat words.  Social Interaction Social Interaction assist level: Interacts appropriately 75 - 89% of the time - Needs redirection for appropriate language or to initiate interaction.  Problem Solving Problem solving assist level: Solves basic 75 - 89% of the time/requires cueing 10 - 24% of the time  Memory Memory assist level: Recognizes or recalls 75 - 89% of the time/requires cueing 10 - 24% of the time   Short Term Goals: Week 1: SLP Short Term Goal 1 (Week 1): Pt will consume dysphagia 1 with nectar thick liquids with minimal overt s/s of aspiration and Mod A cues for use of compensatory swallow strategies.  SLP Short Term Goal 2 (Week 1): Pt will consume trials of thin liquids with Mod A cues for chin tuck and minimal overt s/s of aspiration.  SLP Short Term Goal 3 (Week 1): Pt will demonstrate intellectual awareness by answering yes/no questions regarding physical and swallowing deficits with Mod A cues.  SLP Short Term Goal 4 (Week 1): Pt will scan to midline to locate objects in 50% of opportunities and Max A cues.  SLP Short Term Goal 5 (Week 1): Pt will complete basic familiar tasks with Min A cues.  Refer to Care Plan for Long Term Goals  Recommendations for other services: None   Discharge Criteria: Patient will be discharged from SLP if patient refuses treatment 3 consecutive times without medical reason, if treatment goals not met, if there is a change in medical status,  if patient makes no progress towards goals or if patient is discharged from hospital.  The above assessment, treatment plan, treatment alternatives and goals were discussed and mutually agreed upon: by patient and by family  Analysse Quinonez Rutherford Nail  05/31/2017, 3:05 PM

## 2017-05-31 NOTE — Progress Notes (Signed)
Occupational Therapy Assessment and Plan  Patient Details  Name: Derek Hays MRN: [id number] Date of Birth: Dec 13, 1930  OT Diagnosis: abnormal posture, ataxia, cognitive deficits, disturbance of vision, flaccid hemiplegia and hemiparesis, hemiplegia affecting non-dominant side and muscle weakness (generalized) Rehab Potential: Rehab Potential (ACUTE ONLY): Fair ELOS: 3-4 weeks   Today's Date: 05/31/2017 OT Individual Time: 1100-1155 OT Individual Time Calculation (min): 55 min     Problem List:  Patient Active Problem List   Diagnosis Date Noted  . 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 Stroke (cerebrum) ([place]) 05/27/2017    Past Medical History:  Past Medical History:  Diagnosis Date  . Atrial fibrillation ([place])   . Hypertension    Past Surgical History:  Past Surgical History:  Procedure Laterality Date  . IR CT HEAD LTD  05/27/2017  . IR PERCUTANEOUS ART THROMBECTOMY/INFUSION INTRACRANIAL INC DIAG ANGIO  05/27/2017  . RADIOLOGY WITH ANESTHESIA N/A 05/27/2017   Procedure: RADIOLOGY WITH ANESTHESIA;  Surgeon: Luanne Bras, MD;  Location: [facility];  Service: Radiology;  Laterality: N/A;    Assessment & Plan Clinical Impression: Derek Hays a 84 y.o.right handed malewith history of hypertension, atrial fibrillation/pacemaker2016 on Eliquisin the past but discontinuedafter traumatic SDH September 2018 with repeat head CT scan 05/10/2017 showed SDH completely reabsorbedfollowed by Dr Bridgette Habermann [facility]. Per chart review patient lives with spouse. Independent using a rolling walker/cane and still driving. One level home with one step to entry. Presented2/11/2019with acute onset of left-sided weakness as well as slurred speech. CT of the head positive for hyperdense right MCA. No hemorrhage or mass effect. Subacute appearing left PCA territory infarct. CT cerebral perfusion scan emergent large vessel  occlusion of the distal M1 segment of the right middle cerebral artery. Underwent revascularization per interventional radiology.Echocardiogram with ejection fraction 94% grade 2 diastolic dysfunction.MRI/MRA02/03/2018 acute right MCA infarct involving the right insula and frontal parietal lobe. Small acute or subacute infarct left parietal lobe.Marland KitchenMRA status post revascularization of right middle cerebral artery occlusion. There remain moderate to severe stenosis proximal right M2 segment which felt to be due to underlying atherosclerotic disease.Presently on aspirin for CVA prophylaxisand planned is to resume ELIQUISin10 days due to size of infarction.Marland KitchenDysphagia #1 nectar thick liquid diet.Physicaland occupationaltherapy evaluationscompleted 05/28/2017 with recommendations of physical medicine rehabilitation consult.Patient was admitted for a comprehensive rehabilitation program  Patient transferred to CIR on 05/30/2017 .    Patient currently requires total with basic self-care skills secondary to muscle weakness, decreased cardiorespiratoy endurance, abnormal tone, ataxia and decreased coordination, decreased visual acuity, decreased visual perceptual skills, decreased visual motor skills, field cut and hemianopsia, decreased attention to left and left side neglect, decreased initiation, decreased attention, decreased awareness, decreased problem solving, decreased safety awareness, decreased memory and delayed processing and decreased sitting balance, decreased standing balance, decreased postural control, hemiplegia and decreased balance strategies.  Prior to hospitalization, patient could complete ADLs/IADLs with modified independent .  Patient will benefit from skilled intervention to decrease level of assist with basic self-care skills and increase independence with basic self-care skills prior to discharge home with care partner.  Anticipate patient will require moderate physical assestance  and follow up home health.  OT - End of Session Activity Tolerance: Tolerates 10 - 20 min activity with multiple rests Endurance Deficit: Yes OT Assessment Rehab Potential (ACUTE ONLY): Fair OT Barriers to Discharge: Decreased caregiver support OT Barriers to Discharge Comments: Pt's 67 year old wife is only one able  to provide assist at d/c, suspect pt will require at least mod A at d/c OT Patient demonstrates impairments in the following area(s): Balance;Cognition;Safety;Edema;Sensory;Endurance;Skin Integrity;Motor;Vision OT Basic ADL's Functional Problem(s): Eating;Grooming;Bathing;Dressing;Toileting OT Transfers Functional Problem(s): Toilet OT Additional Impairment(s): Fuctional Use of Upper Extremity OT Plan OT Intensity: Minimum of 1-2 x/day, 45 to 90 minutes OT Frequency: 5 out of 7 days OT Duration/Estimated Length of Stay: 3-4 weeks OT Treatment/Interventions: Balance/vestibular training;Discharge planning;Functional electrical stimulation;Pain management;Self Care/advanced ADL retraining;Therapeutic Activities;UE/LE Coordination activities;Visual/perceptual remediation/compensation;Therapeutic Exercise;Skin care/wound managment;Patient/family education;Functional mobility training;Disease mangement/prevention;Cognitive remediation/compensation;DME/adaptive equipment instruction;Neuromuscular re-education;Psychosocial support;Splinting/orthotics;UE/LE Strength taining/ROM;Wheelchair propulsion/positioning OT Self Feeding Anticipated Outcome(s): Set-up/supervision OT Basic Self-Care Anticipated Outcome(s): Min-mod A OT Toileting Anticipated Outcome(s): Mod A OT Bathroom Transfers Anticipated Outcome(s): Mod A OT Recommendation Patient destination: [facility] (SNF) Follow Up Recommendations: Skilled nursing facility Equipment Recommended: To be determined   Skilled Therapeutic Intervention Pt seen for OT Eval and ADL bathing/dressing session. Pt asleep in  tilt-in-space w/c upon arrival, easily awoken and agreeable to tx session.  Bathing/dressing completed from w/c level at sink, pt requiring max cuing to attend to L side of body during bathing tasks as well as to locate needed items placed on L side of sink ledge. Pt required total A for LB. He demonstrated difficulty following directions and required significantly increased time for processing. Oral care completed via suction toothbrush with total A set-up and min A. Pt taken on tour of unit in w/c, required max multimodal cuing to turn head and attend to items on L side.  Pt returned to room at end of session, agreeable to staying up in w/c to eat lunch. Pt left slightly reclined in chair with QRB donned and all needs in reach. Reviewed use of call bell. RN made aware of pt's position and therapist recommendation for +2 maximove transfer back to bed following lunch.   OT Evaluation Precautions/Restrictions  Precautions Precautions: Fall Precaution Comments:  neglect Restrictions Weight Bearing Restrictions: No General Chart Reviewed: Yes Pain Pain Assessment Pain Score: 0-No pain Home Living/Prior Functioning Home Living Available Help at Discharge: Family, Available 24 hours/day Type of Home: House Home Access: Stairs to enter Technical brewer of Steps: 1 Home Layout: One level Bathroom Shower/Tub: Multimedia programmer: Standard  Lives With: Spouse IADL History Current License: Yes Mode of Transportation: Musician Occupation: Retired Type of Occupation: Retired from the [employer] and Hobbies: Likes to travel Prior Function Level of Independence: Requires assistive device for independence, Independent with basic ADLs, Independent with homemaking with ambulation, Independent with transfers  Able to Take Stairs?: Yes Driving: Yes Comments: ADLs, iADLs, driving. using RW, rollator, and cane for mobility Vision Baseline Vision/History: Wears glasses Wears  Glasses: At all times Patient Visual Report: No change from baseline Vision Assessment?: Vision impaired- to be further tested in functional context Eye Alignment: Impaired (comment) Ocular Range of Motion: Restricted on the left Alignment/Gaze Preference: Head turned;Gaze right Visual Fields: Left homonymous hemianopsia;Left visual field deficit Additional Comments: L inattention and field cut Perception  Perception: Impaired Inattention/Neglect: Does not attend to left side of body;Does not attend to left visual field Praxis Praxis: Impaired Praxis Impairment Details: Initiation Cognition Arousal/Alertness: Awake/alert Orientation Level: Person;Place;Situation Person: Oriented Place: Oriented Situation: Oriented Year: 2019 Month: February Day of Week: Correct Memory: Impaired Memory Impairment: Decreased recall of new information;Prospective memory Immediate Memory Recall: Sock;Blue;Bed Memory Recall: Sock;Blue;Bed Memory Recall Sock: Without Cue Memory Recall Blue: Without Cue Memory Recall Bed: Without Cue Attention: Sustained;Selective Sustained Attention: Appears intact Selective  Attention: Appears intact Awareness: Impaired Awareness Impairment: Emergent impairment;Anticipatory impairment Problem Solving: Impaired Problem Solving Impairment: Functional basic;Verbal basic Safety/Judgment: Impaired Sensation Sensation Light Touch: Impaired Detail Light Touch Impaired Details: Absent LUE Proprioception: Impaired Detail Proprioception Impaired Details: Impaired LUE Coordination Gross Motor Movements are Fluid and Coordinated: No Fine Motor Movements are Fluid and Coordinated: No Coordination and Movement Description: Dense L hemiplegia Finger Nose Finger Test: Decreased speed and accuracy R UE, unable to perform L UE due to faccidity Motor  Motor Motor: Hemiplegia Motor - Skilled Clinical Observations: Dense  hemiplegia   Trunk/Postural Assessment  Cervical  Assessment Cervical Assessment: Exceptions to WFL(Head tilt) Thoracic Assessment Thoracic Assessment: Exceptions to WFL(Kyphotic; rounded shoulders) Lumbar Assessment Lumbar Assessment: Exceptions to WFL(Posterior pelvic tilt) Postural Control Postural Control: Deficits on evaluation(Severely delayed- non-existant righting reactions)  Extremity/Trunk Assessment RUE Assessment RUE Assessment: Within Functional Limits LUE Assessment LUE Assessment: Exceptions to BWL(8/9; unable to elicit any movement)   See Function Navigator for Current Functional Status.   Refer to Care Plan for Long Term Goals  Recommendations for other services: None    Discharge Criteria: Patient will be discharged from OT if patient refuses treatment 3 consecutive times without medical reason, if treatment goals not met, if there is a change in medical status, if patient makes no progress towards goals or if patient is discharged from hospital.  The above assessment, treatment plan, treatment alternatives and goals were discussed and mutually agreed upon: by patient  Trentan Trippe L 05/31/2017, 12:26 PM

## 2017-05-31 NOTE — Progress Notes (Signed)
Kirsteins, Luanna Salk, MD  Physician  Physical Medicine and Rehabilitation  Consult Note  Signed  Date of Service:  05/28/2017 2:31 PM       Related encounter: ED to Hosp-Admission (Discharged) from 05/27/2017 in [facility] [place] 3W [facility]      Signed      Expand All Collapse All       [] Hide copied text  [] Hover for details        Physical Medicine and Rehabilitation Consult Reason for Consult: Left-sided weakness Referring Physician: Dr.Xu   HPI: Derek Hays is a 84 y.o. right handed male with history of hypertension, atrial fibrillation/pacemaker 2016 on Eliquis in the past but discontinued by cardiology for concern of bleeding risk after patient with recent fall followed by Dr Bridgette Habermann [facility]. Per chart review patient lives with spouse. Independent using a rolling walker/cane and still driving. One level home with one step to entry. Presented 05/27/2017 with acute onset of left-sided weakness as well as slurred speech. CT of the head positive for hyperdense right MCA. No hemorrhage or mass effect. Subacute appearing left PCA territory infarct. CT cerebral perfusion scan emergent large vessel occlusion of the distal M1 segment of the right middle cerebral artery. Underwent revascularization per interventional radiology. Echocardiogram with ejection fraction 65% grade 2 diastolic dysfunction. MRI/MRA 05/28/2017 acute right MCA infarct involving the right insula and frontal parietal lobe. Small acute or subacute infarct left parietal lobe.[name] MRA status post revascularization of right middle cerebral artery occlusion. There remain moderate to severe stenosis proximal right M2 segment which felt to be due to underlying atherosclerotic disease. Presently on aspirin for CVA prophylaxis. Physical and occupational therapy evaluations completed 05/28/2017 with recommendations of physical medicine rehabilitation consult.  Seen by speech therapy initially made n.p.o. but after  modified upgraded to D1 nectar Warning to wife patient used a walker at times.  Wife also uses a walker mainly when she goes out  Review of Systems  Constitutional: Negative for chills and fever.  HENT: Negative for hearing loss.   Eyes: Negative for blurred vision and double vision.  Respiratory: Negative for cough and shortness of breath.   Cardiovascular: Positive for palpitations. Negative for chest pain and leg swelling.  Gastrointestinal: Positive for constipation. Negative for nausea and vomiting.  Genitourinary: Positive for urgency. Negative for dysuria, flank pain and hematuria.  Musculoskeletal: Positive for joint pain and myalgias.  Skin: Negative for rash.  Neurological: Positive for sensory change, speech change and focal weakness.  All other systems reviewed and are negative.      Past Medical History:  Diagnosis Date  . Atrial fibrillation ([place])   . Hypertension         Past Surgical History:  Procedure Laterality Date  . RADIOLOGY WITH ANESTHESIA N/A 05/27/2017   Procedure: RADIOLOGY WITH ANESTHESIA;  Surgeon: Luanne Bras, MD;  Location: [facility];  Service: Radiology;  Laterality: N/A;   History reviewed. No pertinent family history. Social History:  reports that  has never smoked. he has never used smokeless tobacco. He reports that he drinks alcohol. He reports that he does not use drugs. Allergies: No Known Allergies       Medications Prior to Admission  Medication Sig Dispense Refill  . atenolol (TENORMIN) 50 MG tablet Take 50 mg by mouth daily.    [name] Dextran 70-Hypromellose, PF, (ARTIFICIAL TEARS PF) 0.1-0.3 % SOLN Place 1 drop into both eyes every 6 (six) hours as needed (for dry eyes).    . furosemide (  LASIX) 20 MG tablet Take 20 mg by mouth daily as needed for fluid.    [name] latanoprost (XALATAN) 0.005 % ophthalmic solution Place 1 drop into both eyes at bedtime.    [name] levothyroxine (SYNTHROID, LEVOTHROID) 112 MCG tablet Take 112 mcg by  mouth daily before breakfast.    . NIFEdipine (ADALAT CC) 90 MG 24 hr tablet Take 90 mg by mouth daily.    . pregabalin (LYRICA) 100 MG capsule Take 100 mg by mouth 2 (two) times daily.    . simvastatin (ZOCOR) 40 MG tablet Take 40 mg by mouth at bedtime.    . timolol (TIMOPTIC-XE) 0.5 % ophthalmic gel-forming Place 1 drop into the left eye every morning.      Home: Home Living Family/patient expects to be discharged to:: Private residence Living Arrangements: Spouse/significant other Available Help at Discharge: Family, Available 24 hours/day Type of Home: House Home Access: Stairs to enter Technical brewer of Steps: 1 Home Layout: One level Bathroom Shower/Tub: Multimedia programmer: Standard(BSC) Home Equipment: Bedside commode, Grab bars - tub/shower, Grab bars - toilet, Environmental consultant - 2 wheels, Cane - single point, Crutches  Functional History: Prior Function Level of Independence: Independent Comments: ADLs, iADLs, driving. using RW, rollator, and cane for mobility Functional Status:  Mobility: Bed Mobility Overal bed mobility: Needs Assistance Bed Mobility: Supine to Sit, Sit to Supine Supine to sit: Max assist, +2 for physical assistance, HOB elevated Sit to supine: Max assist, +2 for physical assistance General bed mobility comments: assist to pivot legs to EOB and elevate trunk with HOB 35 degrees. With return to supine assist to bend onto right forearm with assist to bring legs onto surface, total assist to slide up in bed Transfers Overall transfer level: Needs assistance Transfers: Sit to/from Stand Sit to Stand: +2 physical assistance, Max assist General transfer comment: bil knees blocked, cues for anterior right translation with assist to rise, pt unable to achieve fully upright. x 2 trials with belt and pad with attempt to pivot hips toward right pt resitant despite 2 person assist and unable to budge hips. Returned to sitting for lift to chair  however after pad placed lift would not function and pt returned to supine Ambulation/Gait General Gait Details: unable  ADL: ADL Overall ADL's : Needs assistance/impaired Grooming: Wash/dry face, Maximal assistance, Sitting Grooming Details (indicate cue type and reason): Pt able to bring wash cloth to face with Max A for sitting at EOB Upper Body Bathing: Maximal assistance, Bed level Lower Body Bathing: Total assistance, Bed level Upper Body Dressing : Maximal assistance, Bed level Lower Body Dressing: Total assistance, Bed level Toileting- Clothing Manipulation and Hygiene: Total assistance, Bed level General ADL Comments: Pt presenting with no AROM of LUE, poor cognition, inattention to left side (body and environment), and decreased balance. Pt maintaining sitting at EOB with Max A; moments with Min A. Pt pushing towards L side during sitting and standing. Very poor attention to left side, able to track with Max verbal and visual cues.   Cognition: Cognition Overall Cognitive Status: Impaired/Different from baseline Orientation Level: Oriented X4 Cognition Arousal/Alertness: Awake/alert Behavior During Therapy: Flat affect Overall Cognitive Status: Impaired/Different from baseline Area of Impairment: Attention, Memory, Following commands, Safety/judgement, Problem solving Current Attention Level: Focused Memory: Decreased short-term memory Following Commands: Follows one step commands inconsistently, Follows one step commands with increased time Safety/Judgement: Decreased awareness of safety, Decreased awareness of deficits Problem Solving: Slow processing, Decreased initiation, Difficulty sequencing, Requires verbal cues, Requires tactile  cues General Comments: left neglect,  pinching his cheek on the left stating it is a washcloth, holding his LUE with RUE stated it is this therapists arm, pt perseverating on kids and wifes location in room  Blood pressure 115/65, pulse  (!) 59, temperature 98.7 F (37.1 C), resp. rate (!) 23, SpO2 99 %. Physical Exam  Vitals reviewed. HENT:  Head: Normocephalic.  Eyes: EOM are normal.  Neck: Normal range of motion. Neck supple. No thyromegaly present.  Cardiovascular: Normal rate and regular rhythm.  Respiratory: Effort normal and breath sounds normal. No respiratory distress.  GI: Soft. Bowel sounds are normal. He exhibits no distension.  Neurological:  Patient is a bit lethargic but arousable. He does have some decreased insight into his deficits. Right gaze preference. Followed simple commands. Speech is dysarthric but intelligible.  Skin: Skin is warm and dry.  Motor strength is 0/5 in the left deltoid bicep tricep grip 2- at the left hip knee extensor synergy otherwise 0 Right upper extremity is 5/5 in the deltoid bicep tricep grip right lower extremities 4/5 in the right hip flexor knee extensor ankle dorsiflexor Sensation is absent to pinch as well as light touch in the left upper and left lower limb            Assessment/Plan: Diagnosis: Right MCA infarct with left hemiparesis left neglect left hemisensory deficits as well as dysphagia 1. Does the need for close, 24 hr/day medical supervision in concert with the patient's rehab needs make it unreasonable for this patient to be served in a less intensive setting? Yes 2. Co-Morbidities requiring supervision/potential complications: Atrial fibrillation, hypertension 3. Due to bladder management, bowel management, safety, skin/wound care, disease management, medication administration, pain management and patient education, does the patient require 24 hr/day rehab nursing? Yes 4. Does the patient require coordinated care of a physician, rehab nurse, PT (1-2 hrs/day, 5 days/week), OT (1-2 hrs/day, 5 days/week) and SLP (.5-1 hrs/day, 5 days/week) to address physical and functional deficits in the context of the above medical diagnosis(es)? Yes Addressing deficits in  the following areas: balance, endurance, locomotion, strength, transferring, bowel/bladder control, bathing, dressing, feeding, grooming, toileting, cognition, speech, swallowing and psychosocial support 5. Can the patient actively participate in an intensive therapy program of at least 3 hrs of therapy per day at least 5 days per week? Yes 6. The potential for patient to make measurable gains while on inpatient rehab is fair 7. Anticipated functional outcomes upon discharge from inpatient rehab are min assist and mod assist  with PT, min assist and mod assist with OT, Safe and adequate p.o. fluid and caloric intake with SLP. 8. Estimated rehab length of stay to reach the above functional goals is: 19-22 days 9. Anticipated D/C setting: Home versus SNF 10. Anticipated post D/C treatments: [place] therapy 11. Overall Rehab/Functional Prognosis: fair  RECOMMENDATIONS: This patient's condition is appropriate for continued rehabilitative care in the following setting: CIR Patient has agreed to participate in recommended program. Yes Note that insurance prior authorization may be required for reimbursement for recommended care.  Comment: May need SNF post CIR  Charlett Blake M.D. [facility] FAAPM&[id number] (Sports Med, Neuromuscular Med) Diplomate Am Board of Electrodiagnostic Med  Elizabeth Sauer 05/28/2017          Revision History                        Routing History

## 2017-05-31 NOTE — Progress Notes (Signed)
Chaplain replied to consult and prayed with the family.  The husband and wife couple were seeking prayer for healing and strength.  The wife of the PT talked about losing a child many years ago and life being married for 95 years

## 2017-05-31 NOTE — Care Management Note (Signed)
[facility] Individual Statement of Services  Patient Name:  Derek Hays  Date:  05/31/2017  Welcome to the [place].  Our goal is to provide you with an individualized program based on your diagnosis and situation, designed to meet your specific needs.  With this comprehensive rehabilitation program, you will be expected to participate in at least 3 hours of rehabilitation therapies Monday-Friday, with modified therapy programming on the weekends.  Your rehabilitation program will include the following services:  Physical Therapy (PT), Occupational Therapy (OT), Speech Therapy (ST), 24 hour per day rehabilitation nursing, Therapeutic Recreaction (TR), Neuropsychology, Case Management (Social Worker), Rehabilitation Medicine, Nutrition Services and Pharmacy Services  Weekly team conferences will be held on Wednesday to discuss your progress.  Your Social Worker will talk with you frequently to get your input and to update you on team discussions.  Team conferences with you and your family in attendance may also be held.  Expected length of stay: 2-3 weeks  Overall anticipated outcome: min-mod level  Depending on your progress and recovery, your program may change. Your Social Worker will coordinate services and will keep you informed of any changes. Your Social Worker's name and contact numbers are listed  below.  The following services may also be recommended but are not provided by the [facility] will be made to provide these services after discharge if needed.  Arrangements include referral to agencies that provide these services.  Your insurance has been verified to be:  Medicare & Tricare Your primary doctor is: None recently moved  Pertinent information will be shared with your doctor and your insurance  company.  Social Worker:  Ovidio Kin, [place] or ([phone]  Information discussed with and copy given to patient by: [name], 05/31/2017, 1:51 PM

## 2017-05-31 NOTE — IPOC Note (Addendum)
Overall Plan of Care [facility]) Patient Details Name: Derek Hays MRN: [id number] DOB: 07/22/1930  Admitting Diagnosis: <principal problem not specified>  Hospital Problems: Active Problems:   Right middle cerebral artery stroke [facility])     Functional Problem List: Nursing Bladder, Bowel, Edema, Endurance, Nutrition  PT Balance, Perception, Behavior, Safety, Edema, Sensory, Endurance, Skin Integrity, Motor, Nutrition, Pain  OT Balance, Cognition, Safety, Edema, Sensory, Endurance, Skin Integrity, Motor, Vision  SLP Cognition, Nutrition  TR         Basic ADL's: OT Eating, Grooming, Bathing, Dressing, Toileting     Advanced  ADL's: OT       Transfers: PT Bed Mobility, Bed to Chair, Car, [employer], Floor  OT Toilet     Locomotion: PT Ambulation, Emergency planning/management officer, Stairs     Additional Impairments: OT Fuctional Use of Upper Extremity  SLP Swallowing, Communication, Social Cognition expression Problem Solving, Memory, Attention, Awareness  TR      Anticipated Outcomes Item Anticipated Outcome  Self Feeding Set-up/supervision  Swallowing  Min A with least restrictive diet   Basic self-care  Min-mod A  Toileting  Mod A   Bathroom Transfers Mod A  Bowel/Bladder  I/O cath with max assist, LBM 05/28/17  Transfers  Min-mod assist with LRAD  Locomotion  Min assist at [facility] level   Communication  Supervision  Cognition  Min A   Pain  less than 2  Safety/Judgment  Pt will remain free from falls   Therapy Plan: PT Intensity: Minimum of 1-2 x/day ,45 to 90 minutes PT Frequency: 5 out of 7 days PT Duration Estimated Length of Stay: 3.5-4 weeks  OT Intensity: Minimum of 1-2 x/day, 45 to 90 minutes OT Frequency: 5 out of 7 days OT Duration/Estimated Length of Stay: 3-4 weeks SLP Intensity: Minumum of 1-2 x/day, 30 to 90 minutes SLP Frequency: 3 to 5 out of 7 days SLP Duration/Estimated Length of Stay: 3 to 4 weeks    Team Interventions: Nursing Interventions  Patient/Family Education, Bladder Management, Pain Management, Bowel Management, Medication Management, Dysphagia/Aspiration Precaution Training  PT interventions Ambulation/gait training, Training and development officer, Cognitive remediation/compensation, Discharge planning, Community reintegration, DME/adaptive equipment instruction, Functional electrical stimulation, Disease management/prevention, Functional mobility training, Patient/family education, Pain management, Neuromuscular re-education, Psychosocial support, Skin care/wound management, Splinting/orthotics, Therapeutic Activities, Stair training, UE/LE Strength taining/ROM, Wheelchair propulsion/positioning, UE/LE Coordination activities, Therapeutic Exercise, Visual/perceptual remediation/compensation  OT Interventions Balance/vestibular training, Discharge planning, Functional electrical stimulation, Pain management, Self Care/advanced ADL retraining, Therapeutic Activities, UE/LE Coordination activities, Visual/perceptual remediation/compensation, Therapeutic Exercise, Skin care/wound managment, Patient/family education, Functional mobility training, Disease mangement/prevention, Cognitive remediation/compensation, DME/adaptive equipment instruction, Neuromuscular re-education, Psychosocial support, Splinting/orthotics, UE/LE Strength taining/ROM, Wheelchair propulsion/positioning  SLP Interventions Cognitive remediation/compensation, English as a second language teacher, Dysphagia/aspiration precaution training, Functional tasks, Patient/family education, Therapeutic Activities, Speech/Language facilitation, Environmental controls, Internal/external aids  TR Interventions    SW/CM Interventions Discharge Planning, Psychosocial Support, Patient/Family Education   Barriers to Discharge MD  Medical stability  Nursing      PT Inaccessible home environment, Decreased caregiver support, Home environment access/layout, Lack of/limited family support    OT Decreased  caregiver support Pt's 46 year old wife is only one able to provide assist at d/c, suspect pt will require at least 84 year old wife is only one able to provide assist at d/c, suspect pt will require at least mod A at d/c  SLP Lack of/limited family support pt states that he takes care of wife  SW Decreased caregiver support Wife not physically able to provide care to pt   Team Discharge Planning: Destination: PT-Home(vs SNF) ,OT- [place] (SNF) , [place] (SNF)  Projected Follow-up: PT-Skilled nursing facility, Home health PT, OT-  Skilled nursing facility, SLP-24 hour supervision/assistance, Skilled Nursing facility Projected Equipment Needs: PT-Wheelchair (measurements), Wheelchair cushion (measurements), Rolling walker with 5" wheels, OT- To be determined, SLP-To be determined Equipment Details: PT- , OT-  Patient/family involved in discharge planning: PT- Patient,  OT-Patient, SLP-Patient  MD ELOS: 21-23d Medical Rehab Prognosis:  Fair Assessment:  84 y.o.right handed malewith history of hypertension, atrial fibrillation/pacemaker2016 on Eliquisin the past but discontinuedafter traumatic SDH September 2018 with repeat head CT scan 05/10/2017 showed SDH completely reabsorbedfollowed by Dr Bridgette [facility]. Per chart review patient lives with spouse. Independent using a rolling walker/cane and still driving. One level home with one step to entry. Presented2/11/2019with acute onset of left-sided weakness as well as slurred speech. CT of the head positive for hyperdense right MCA. No hemorrhage or mass effect. Subacute appearing left PCA territory infarct. CT cerebral perfusion scan emergent large vessel occlusion of the distal M1 segment of the right middle cerebral artery. Underwent revascularization per interventional radiology.Echocardiogram with ejection fraction 67% grade 2 diastolic dysfunction.MRI/MRA02/03/2018 acute right MCA infarct involving the right insula and frontal parietal lobe. Small acute or subacute infarct left parietal  lobe.Marland KitchenMRA status post revascularization of right middle cerebral artery occlusion. There remain moderate to severe stenosis proximal right M2 segment which felt to be due to underlying atherosclerotic disease.Presently on aspirin for CVA prophylaxisand planned is to resume ELIQUISin10 days due to size of infarction.Marland KitchenDysphagia #1 nectar thick liquid diet.   Now requiring 24/7 Rehab RN,MD, as well as CIR level PT, OT and SLP.  Treatment team will focus on ADLs and mobility with goals set at [facility] See Team Conference Notes for weekly updates to the plan of care

## 2017-05-31 NOTE — Progress Notes (Signed)
Derek Diones, RN  Rehab Admission Coordinator  Physical Medicine and Rehabilitation  PMR Pre-admission  Signed  Date of Service:  05/30/2017 10:36 AM       Related encounter: ED to Hosp-Admission (Discharged) from 05/27/2017 in [facility]      Signed            [] Hide copied text  [] Hover for details   PMR Admission Coordinator Pre-Admission Assessment  Patient: Derek Hays is an 84 y.o., male MRN: [id number] DOB: [birth year] Height:   Weight:                                                                                                                                    Insurance Information HMO: No    PPO:       PCP:       IPA:       80/20:       OTHER:   PRIMARY:  Medicare A and B      Policy#: [id number]      Subscriber: Patient CM Name:        Phone#:       Fax#:   No Pre-Cert#:        Employer:  Retired Benefits:  Phone #:       Name: Checked in Passport one source [employer] Date: A=02/14/98 and B=10/15/98     Deduct: $1364      Out of Pocket Max: none      Life Max: Unlimited CIR: 100%      SNF: 100 days Outpatient: 80%     Co-Pay: 20% Home Health: 100%      Co-Pay: none DME: 80%     Co-Pay: 20% Providers: patient's choice  SECONDARY: Tricare for Life      Policy#: [id number]      Subscriber:  patient CM Name:        Phone#:       Fax#:   Pre-Cert#:        Employer: Retired Benefits:  Phone #: [phone]     Name:   Eff. Date:       Deduct:        Out of Pocket Max:        Life Max:   CIR:        SNF:   Outpatient:       Co-Pay:   Home Health:        Co-Pay:   DME:       Co-Pay:    Emergency Contact Information        Contact Information    Name Relation Home Work [place] [phone]  [phone]     Current Medical History  Patient Admitting Diagnosis:  R MCA infarct  History of Present Illness: An 84 y.o.right handed malewith history of hypertension, atrial fibrillation/pacemaker2016 on  Eliquisin the past but discontinuedafter  traumatic SDH September 2018 with repeat head CT scan 05/10/2017 showed SDH completely reabsorbedfollowed by Dr Bridgette Habermann [facility]. Per chart review patient lives with spouse. Independent using a rolling walker/cane and still driving. One level home with one step to entry. Presented2/11/2019with acute onset of left-sided weakness as well as slurred speech. CT of the head positive for hyperdense right MCA. No hemorrhage or mass effect. Subacute appearing left PCA territory infarct. CT cerebral perfusion scan emergent large vessel occlusion of the distal M1 segment of the right middle cerebral artery. Underwent revascularization per interventional radiology.Echocardiogram with ejection fraction 56% grade 2 diastolic dysfunction.MRI/MRA02/03/2018 acute right MCA infarct involving the right insula and frontal parietal lobe. Small acute or subacute infarct left parietal lobe.Marland KitchenMRA status post revascularization of right middle cerebral artery occlusion. There remain moderate to severe stenosis proximal right M2 segment which felt to be due to underlying atherosclerotic disease.Presently on aspirin for CVA prophylaxisand planned is to resume ELIQUIS7-10 days due to size of infarction.Marland KitchenDysphagia #1 nectar thick liquid diet.Physicaland occupationaltherapy evaluationscompleted 05/28/2017 with recommendations of physical medicine rehabilitation consult.Patient to be admitted for a comprehensive inpatient rehabilitation program.   Total: 13=NIH  Past Medical History      Past Medical History:  Diagnosis Date  . Atrial fibrillation ([place])   . Hypertension     Family History  family history is not on file.  Prior Rehab/Hospitalizations: No rehab recently.  Has the patient had major surgery during 100 days prior to admission? No  Current Medications   Current Facility-Administered Medications:  .   stroke: mapping our early stages of recovery book, ,  Does not apply, Once, Kerney Elbe, MD .  acetaminophen (TYLENOL) tablet 650 mg, 650 mg, Oral, Q4H PRN **OR** acetaminophen (TYLENOL) solution 650 mg, 650 mg, Per Tube, Q4H PRN **OR** acetaminophen (TYLENOL) suppository 650 mg, 650 mg, Rectal, Q4H PRN, Kerney Elbe, MD .  aspirin EC tablet 325 mg, 325 mg, Oral, Daily, 325 mg at 05/29/17 1008 **OR** aspirin suppository 300 mg, 300 mg, Rectal, Daily, Rosalin Hawking, MD .  atenolol (TENORMIN) tablet 50 mg, 50 mg, Oral, Daily, Kerney Elbe, MD, 50 mg at 05/29/17 1008 .  chlorhexidine (PERIDEX) 0.12 % solution 15 mL, 15 mL, Mouth Rinse, BID, Rosalin Hawking, MD, 15 mL at 05/29/17 2200 .  enoxaparin (LOVENOX) injection 40 mg, 40 mg, Subcutaneous, Q24H, Rosalin Hawking, MD, 40 mg at 05/29/17 2200 .  furosemide (LASIX) tablet 20 mg, 20 mg, Oral, Daily PRN, Kerney Elbe, MD .  labetalol (NORMODYNE,TRANDATE) injection 10-20 mg, 10-20 mg, Intravenous, Q2H PRN, Rosalin Hawking, MD .  latanoprost (XALATAN) 0.005 % ophthalmic solution 1 drop, 1 drop, Both Eyes, QHS, Kerney Elbe, MD, 1 drop at 05/29/17 2201 .  levothyroxine (SYNTHROID, LEVOTHROID) tablet 112 mcg, 112 mcg, Oral, QAC breakfast, Kerney Elbe, MD, 112 mcg at 05/29/17 1000 .  nitroGLYCERIN 25 mcg in sodium chloride 0.9 % 59.71 mL (25 mcg/mL) syringe, , , PRN, Luanne Bras, MD, 25 mcg at 05/27/17 2205 .  polyvinyl alcohol (LIQUIFILM TEARS) 1.4 % ophthalmic solution 1 drop, 1 drop, Both Eyes, Q6H PRN, Kerney Elbe, MD .  pregabalin (LYRICA) capsule 100 mg, 100 mg, Oral, BID, Kerney Elbe, MD, 100 mg at 05/29/17 2159 .  RESOURCE THICKENUP CLEAR, , Oral, PRN, Rosalin Hawking, MD .  senna-docusate (Senokot-S) tablet 1 tablet, 1 tablet, Oral, QHS PRN, Kerney Elbe, MD .  simvastatin (ZOCOR) tablet 40 mg, 40 mg, Oral, QHS, Kerney Elbe, MD, 40 mg at 05/29/17 2200 .  tamsulosin (FLOMAX) capsule 0.4 mg,  0.4 mg, Oral, QPC supper, Costello, Mary A, NP .  tamsulosin (FLOMAX) capsule 0.8 mg, 0.8 mg, Oral, Once,  Costello, Mary A, NP .  timolol (TIMOPTIC-XR) 0.5 % ophthalmic gel-forming 1 drop, 1 drop, Left Eye, q morning - 10a, Kerney Elbe, MD, 1 drop at 05/29/17 1008  Patients Current Diet: Fall precautions DIET - DYS 1 Room service appropriate? Yes; Fluid consistency: Nectar Thick  Precautions / Restrictions Precautions Precautions: Fall Precaution Comments: left neglect, pusher Restrictions Weight Bearing Restrictions: No   Has the patient had 2 or more falls or a fall with injury in the past year?No.  Patient reports 1 fall this past year trying to open his shed.  Prior Activity Level Community (5-7x/wk): Went out daily.  Was driving.  Home Assistive Devices / Equipment Home Assistive Devices/Equipment: None Home Equipment: Bedside commode, Grab bars - tub/shower, Grab bars - toilet, Walker - 2 wheels, Cane - single point, Crutches  Prior Device Use: Indicate devices/aids used by the patient prior to current illness, exacerbation or injury? Cane and rollator.  Prior Functional Level Prior Function Level of Independence: Independent Comments: ADLs, IADLs, driving. using RW, rollator, and cane for mobility  Self Care: Did the patient need help bathing, dressing, using the toilet or eating?  Independent  Indoor Mobility: Did the patient need assistance with walking from room to room (with or without device)? Independent  Stairs: Did the patient need assistance with internal or external stairs (with or without device)? Independent  Functional Cognition: Did the patient need help planning regular tasks such as shopping or remembering to take medications? Independent  Current Functional Level Cognition  Arousal/Alertness: Awake/alert Overall Cognitive Status: Impaired/Different from baseline Current Attention Level: Focused Orientation Level: Oriented X4 Following Commands: Follows one step commands inconsistently, Follows one step commands with increased  time Safety/Judgement: Decreased awareness of safety, Decreased awareness of deficits General Comments: patient continues to demonstrates left side inattention but is able to come past midline line with VCs Attention: Sustained, Selective Sustained Attention: Appears intact Selective Attention: Appears intact Memory: Impaired Memory Impairment: Decreased recall of new information, Prospective memory Awareness: Impaired Awareness Impairment: Emergent impairment, Anticipatory impairment(intellecutual awareness developing) Problem Solving: Impaired Problem Solving Impairment: Functional basic Safety/Judgment: Impaired    Extremity Assessment (includes Sensation/Coordination)  Upper Extremity Assessment: LUE deficits/detail LUE Deficits / Details: Brunstrom stage 1 with no active movement. Pt with neglect of left side and poor sensation LUE Sensation: decreased light touch, decreased proprioception LUE Coordination: decreased fine motor, decreased gross motor  Lower Extremity Assessment: Defer to PT evaluation LLE Deficits / Details: grossly 2/5 pt able to move it spontaneously but not moving it on command    ADLs  Overall ADL's : Needs assistance/impaired Grooming: Wash/dry face, Maximal assistance, Sitting Grooming Details (indicate cue type and reason): Pt able to bring wash cloth to face with Max A for sitting at EOB Upper Body Bathing: Maximal assistance, Bed level Lower Body Bathing: Total assistance, Bed level Upper Body Dressing : Maximal assistance, Bed level Lower Body Dressing: Total assistance, Bed level Toileting- Clothing Manipulation and Hygiene: Total assistance, Bed level General ADL Comments: Pt presenting with no AROM of LUE, poor cognition, inattention to left side (body and environment), and decreased balance. Pt maintaining sitting at EOB with Max A; moments with Min A. Pt pushing towards L side during sitting and standing. Very poor attention to left side, able  to track with Max verbal and visual cues.     Mobility  Overal bed mobility:  Needs Assistance Bed Mobility: Rolling Supine to sit: Max assist, +2 for physical assistance, HOB elevated Sit to supine: Max assist, +2 for physical assistance General bed mobility comments: modest use of RUE with cues    Transfers  Overall transfer level: Needs assistance Transfers: Lateral/Scoot Transfers Sit to Stand: Max assist, +2 physical assistance General transfer comment: performed a 2 person lateral slide transfer OOB to chair, patient able to initiate some RLE elevation during positioning in chair and use of RUE on right arm rest of chair    Ambulation / Gait / Stairs / Wheelchair Mobility  Ambulation/Gait General Gait Details: unable    Posture / Balance Dynamic Sitting Balance Sitting balance - Comments: very minimal trunk engagement with activity, able to use RUE to pull to upright and reposition with max assist Balance Overall balance assessment: Needs assistance Sitting-balance support: Single extremity supported Sitting balance-Leahy Scale: Poor Sitting balance - Comments: very minimal trunk engagement with activity, able to use RUE to pull to upright and reposition with max assist Postural control: Left lateral lean, Posterior lean Standing balance-Leahy Scale: Zero    Special needs/care consideration BiPAP/CPAP No CPM No Continuous Drip IV No Dialysis No         Life Vest* No Oxygen No Special Bed No Trach Size No Wound Vac (area) No      Skin Itchy skin from past injury in [place]                            Bowel mgmt:No documented BM since admission. Bladder mgmt: Incontinence Diabetic mgmt Yes, controlled by diet    Previous Home Environment Living Arrangements: Spouse/significant other  Lives With: Spouse Available Help at Discharge: Family, Available 24 hours/day Type of Home: House Home Layout: One level Home Access: Stairs to enter [employer]  of Steps: 1 Bathroom Shower/Tub: Multimedia programmer: Standard(BSC) Home Care Services: No  Discharge Living Setting Plans for Discharge Living Setting: House, Lives with (comment)(Lives with wife.) Patient and his wife have been together for 60 years. Type of Home at Discharge: House Discharge Home Layout: One level Discharge Home Access: Stairs to enter Entrance Stairs-Number of Steps: 1 step at the front and 1 step at the garage entry Does the patient have any problems obtaining your medications?: No  Social/Family/Support Systems Patient Roles: Spouse(Has a wife.) Contact Information: Fredi Geiler - wife - [phone] Anticipated Caregiver: Wife Ability/Limitations of Caregiver: Wife cannot do physical assistance, but can provide supervision Caregiver Availability: 24/7 Discharge Plan Discussed with Primary Caregiver: Yes Is Caregiver In Agreement with Plan?: Yes Does Caregiver/Family have Issues with Lodging/Transportation while Pt is in Rehab?: No  Goals/Additional Needs Patient/Family Goal for Rehab: PT/OT min to mod assist, SLP supervision to mod I goals Expected length of stay: 19-22 days Cultural Considerations: Catholic Dietary Needs: Dys 1, nectar thick liquids Equipment Needs: TBD Additional Information: Wife reports that patient and she recently moved from [place], [place] Pt/Family Agrees to Admission and willing to participate: Yes Program Orientation Provided & Reviewed with Pt/Caregiver Including Roles  & Responsibilities: Yes  Decrease burden of Care through IP rehab admission: Yes  Possible need for SNF placement upon discharge: May need SNF after CIR if wife cannot manage functional level achieved at home after discharge.    Patient Condition: This patient's medical and functional status has changed since the consult dated: 05/28/17 in which the Rehabilitation Physician determined and documented that the patient's condition is  appropriate for  intensive rehabilitative care in an inpatient rehabilitation facility. See "History of Present Illness" (above) for medical update. Functional changes are: Currently requiring max assist for sit to stand. Patient's medical and functional status update has been discussed with the Rehabilitation physician and patient remains appropriate for inpatient rehabilitation. Will admit to inpatient rehab today.  Preadmission Screen Completed By:  Derek Hays, 05/30/2017 10:57 AM ______________________________________________________________________   Discussed status with Dr. Letta Pate on 05/30/17 at 1057 and received telephone approval for admission today.  Admission Coordinator:  Derek Hays, time 1057/Date 05/30/17             Cosigned by: Charlett Blake, MD at 05/30/2017 11:20 AM  Revision History

## 2017-05-31 NOTE — Progress Notes (Signed)
Subjective/Complaints: Patient does not remember me from yesterday.  He states that he did sleep well.  He has difficulty with feeding himself.  Most of his breakfast is on a towel spread across his chest. No coughing or shortness of breath noted ROS: Limited secondary to mental status Objective: Vital Signs: Blood pressure 132/74, pulse 60, temperature 98 F (36.7 C), temperature source Axillary, resp. rate 16, height 6' (1.829 m), weight 100.5 kg (221 lb 9 oz), SpO2 98 %. Dg Swallowing Func-speech Pathology  Result Date: 05/29/2017 Objective Swallowing Evaluation: Type of Study: MBS-Modified Barium Swallow Study  Patient Details Name: Derek Hays MRN: [id number] Date of Birth: [birth year] Today's Date: 05/29/2017 Time: SLP Start Time (ACUTE ONLY): 1020 -SLP Stop Time (ACUTE ONLY): 1043 SLP Time Calculation (min) (ACUTE ONLY): 23 min Past Medical History: Past Medical History: Diagnosis Date . Atrial fibrillation ([place])  . Hypertension  Past Surgical History: Past Surgical History: Procedure Laterality Date . IR CT HEAD LTD  05/27/2017 . IR PERCUTANEOUS ART THROMBECTOMY/INFUSION INTRACRANIAL INC DIAG ANGIO  05/27/2017 . RADIOLOGY WITH ANESTHESIA N/A 05/27/2017  Procedure: RADIOLOGY WITH ANESTHESIA;  Surgeon: Luanne Bras, MD;  Location: [facility];  Service: Radiology;  Laterality: N/A; HPI: Mr.Derek Hays a 84 y.o.malewith history of A. fibs/ppacemaker not on AC due to recent?? SDH, hypertensionadmitted foracute onset left-sided weaknessand right gaze. No tPA given due toleftPCA in the right MCA hypodensity on CT.Status post endovascular treatment. MRI shows Acute right MCA infarct involving the right insula and frontal parietal lobe. Small acute or subacute infarct left parietal lobe. Subacute infarct left occipital lobe. Findings suggest emboli.  No Data Recorded Assessment / Plan / Recommendation CHL IP CLINICAL IMPRESSIONS 05/29/2017 Clinical Impression Pt demonstrates a moderate  oral dysphagia 84 with left lingual and lingual weakness with anterior spillage and pooling, slow masticationa nd bolus formation with left buccal residuals. A straw is beneficial for oral containment with liquids. There is a delay in swallow initiation intermittently, though even in best timing, there is slightly incomplete laryngeal closure. At best there is flash penetration of nectar thick liquids and at worst, silent aspiration of thin liquids. A chin tuck with a straw with thin liquids consistently eliminated aspiration. Recommend pt initiate a dys 1 (puree) diet with nectar thick liquids with trials of thin liquids with a chin tuck in further therapy sessions. Pills may be given whole in puree. Will f/u for further tolerance and interventions.  SLP Visit Diagnosis Dysphagia, oropharyngeal phase (R13.12) Attention and concentration deficit following -- Frontal lobe and executive function deficit following -- Impact on safety and function Moderate aspiration risk   CHL IP TREATMENT RECOMMENDATION 05/29/2017 Treatment Recommendations Therapy as outlined in treatment plan below   Prognosis 05/29/2017 Prognosis for Safe Diet Advancement Good Barriers to Reach Goals Cognitive deficits Barriers/Prognosis Comment -- CHL IP DIET RECOMMENDATION 05/29/2017 SLP Diet Recommendations Dysphagia 1 (Puree) solids;Nectar thick liquid Liquid Administration via Straw Medication Administration Whole meds with puree Compensations Slow rate;Small sips/bites;Lingual sweep for clearance of pocketing;Monitor for anterior loss;Clear throat intermittently Postural Changes Remain semi-upright after after feeds/meals (Comment);Seated upright at 90 degrees   CHL IP OTHER RECOMMENDATIONS 05/29/2017 Recommended Consults -- Oral Care Recommendations Oral care BID Other Recommendations Have oral suction available   CHL IP FOLLOW UP RECOMMENDATIONS 05/29/2017 Follow up Recommendations Inpatient Rehab   CHL IP FREQUENCY AND DURATION 05/29/2017 Speech  Therapy Frequency (ACUTE ONLY) min 2x/week Treatment Duration 2 weeks      CHL IP ORAL PHASE 05/29/2017 Oral Phase Impaired Oral -  Pudding Teaspoon -- Oral - Pudding Cup -- Oral - Honey Teaspoon -- Oral - Honey Cup -- Oral - Nectar Teaspoon -- Oral - Nectar Cup -- Oral - Nectar Straw Other (Comment) Oral - Thin Teaspoon -- Oral - Thin Cup -- Oral - Thin Straw Premature spillage;Decreased bolus cohesion;Pocketing in anterior sulcus;Left pocketing in lateral sulci Oral - Puree Left pocketing in lateral sulci Oral - Mech Soft Premature spillage;Decreased bolus cohesion;Pocketing in anterior sulcus;Left pocketing in lateral sulci;Delayed oral transit;Lingual/palatal residue;Weak lingual manipulation;Reduced posterior propulsion Oral - Regular -- Oral - Multi-Consistency -- Oral - Pill -- Oral Phase - Comment --  CHL IP PHARYNGEAL PHASE 05/29/2017 Pharyngeal Phase Impaired Pharyngeal- Pudding Teaspoon -- Pharyngeal -- Pharyngeal- Pudding Cup -- Pharyngeal -- Pharyngeal- Honey Teaspoon -- Pharyngeal -- Pharyngeal- Honey Cup -- Pharyngeal -- Pharyngeal- Nectar Teaspoon -- Pharyngeal -- Pharyngeal- Nectar Cup -- Pharyngeal -- Pharyngeal- Nectar Straw Penetration/Aspiration before swallow;Penetration/Aspiration during swallow;Reduced airway/laryngeal closure;Delayed swallow initiation-vallecula Pharyngeal Material enters airway, CONTACTS cords and then ejected out;Material enters airway, remains ABOVE vocal cords then ejected out;Material does not enter airway Pharyngeal- Thin Teaspoon -- Pharyngeal -- Pharyngeal- Thin Cup -- Pharyngeal -- Pharyngeal- Thin Straw Penetration/Aspiration before swallow;Penetration/Aspiration during swallow;Reduced airway/laryngeal closure;Trace aspiration;Delayed swallow initiation-pyriform sinuses Pharyngeal Material enters airway, passes BELOW cords without attempt by patient to eject out (silent aspiration);Material enters airway, CONTACTS cords and then ejected out Pharyngeal- Puree Delayed  swallow initiation-vallecula Pharyngeal -- Pharyngeal- Mechanical Soft Delayed swallow initiation-vallecula Pharyngeal -- Pharyngeal- Regular -- Pharyngeal -- Pharyngeal- Multi-consistency -- Pharyngeal -- Pharyngeal- Pill -- Pharyngeal -- Pharyngeal Comment --  No flowsheet data found. No flowsheet data found. Herbie [place], [place] CCC-SLP [phone] Othelia Pulling, Katherene Ponto 05/29/2017, 1:27 PM              Results for orders placed or performed during the hospital encounter of 05/27/17 (from the past 72 hour(s))  CBC     Status: Abnormal   Collection Time: 05/29/17  4:55 AM  Result Value Ref Range   WBC 12.4 (H) 4.0 - 10.5 K/uL   RBC 3.85 (L) 4.22 - 5.81 MIL/uL   Hemoglobin 11.5 (L) 13.0 - 17.0 g/dL   HCT 34.6 (L) 39.0 - 52.0 %   MCV 89.9 78.0 - 100.0 fL   MCH 29.9 26.0 - 34.0 pg   MCHC 33.2 30.0 - 36.0 g/dL   RDW 13.7 11.5 - 15.5 %   Platelets 230 150 - 400 K/uL    Comment: Performed at [facility], [place] [address] [place], [place] [address]  Basic metabolic panel     Status: Abnormal   Collection Time: 05/29/17  4:55 AM  Result Value Ref Range   Sodium 140 135 - 145 mmol/L   Potassium 4.1 3.5 - 5.1 mmol/L   Chloride 111 101 - 111 mmol/L   CO2 17 (L) 22 - 32 mmol/L   Glucose, Bld 118 (H) 65 - 99 mg/dL   BUN 25 (H) 6 - 20 mg/dL   Creatinine, Ser 1.32 (H) 0.61 - 1.24 mg/dL   Calcium 8.7 (L) 8.9 - 10.3 mg/dL   GFR calc non Af Amer 48 (L) >60 mL/min   GFR calc Af Amer 55 (L) >60 mL/min    Comment: (NOTE) The eGFR has been calculated using the CKD EPI equation. This calculation has not been validated in all clinical situations. eGFR's persistently <60 mL/min signify possible Chronic Kidney Disease.    Anion gap 12 5 - 15    Comment: Performed at [facility], [place] [address] [place], [place] [address]  CBC     Status: Abnormal   Collection Time: 05/30/17  2:39 AM  Result Value Ref Range   WBC 9.7 4.0 - 10.5 K/uL   RBC 3.92 (L) 4.22 - 5.81 MIL/uL   Hemoglobin 11.9 (L)  13.0 - 17.0 g/dL   HCT 35.4 (L) 39.0 - 52.0 %   MCV 90.3 78.0 - 100.0 fL   MCH 30.4 26.0 - 34.0 pg   MCHC 33.6 30.0 - 36.0 g/dL   RDW 13.7 11.5 - 15.5 %   Platelets 199 150 - 400 K/uL    Comment: Performed at [facility], [place] [address], [place], [place] [address]  Basic metabolic panel     Status: Abnormal   Collection Time: 05/30/17  2:39 AM  Result Value Ref Range   Sodium 139 135 - 145 mmol/L   Potassium 4.0 3.5 - 5.1 mmol/L   Chloride 109 101 - 111 mmol/L   CO2 20 (L) 22 - 32 mmol/L   Glucose, Bld 133 (H) 65 - 99 mg/dL   BUN 23 (H) 6 - 20 mg/dL   Creatinine, Ser 1.25 (H) 0.61 - 1.24 mg/dL   Calcium 8.6 (L) 8.9 - 10.3 mg/dL   GFR calc non Af Amer 51 (L) >60 mL/min   GFR calc Af Amer 59 (L) >60 mL/min    Comment: (NOTE) The eGFR has been calculated using the CKD EPI equation. This calculation has not been validated in all clinical situations. eGFR's persistently <60 mL/min signify possible Chronic Kidney Disease.    Anion gap 10 5 - 15    Comment: Performed at [facility] [address], [place],  [address]  Glucose, capillary     Status: None   Collection Time: 05/30/17  6:22 AM  Result Value Ref Range   Glucose-Capillary 96 65 - 99 mg/dL   Comment 1 Notify RN    Comment 2 Document in Chart      HEENT: Exhibits pocketing during feeding Cardio: RRR and No murmur Resp: CTA B/L and Unlabored GI: BS positive and Nontender and non-distended Extremity:  Edema Dorsum of left hand and wrist Skin:   Other Multiple ecchymoses left upper extremity Neuro: Alert/Oriented, Abnormal Sensory Decreased sensation left upper and left lower limb, Abnormal Motor 0/5 left upper extremity, 2- hip knee extensor synergy in the left lower extremity otherwise 0.  Right side is normal, Tone:  Hypotonia, Dysarthric and Inattention Musc/Skel:  Other No pain with upper or lower limb range of motion General no acute distress   Assessment/Plan: 1. Functional deficits  secondary to right MCA infarct with left hemiparesis left neglect left hemisensory deficits as well as cognitive deficits which require 3+ hours per day of interdisciplinary therapy in a comprehensive inpatient rehab setting. Physiatrist is providing close team supervision and 24 hour management of active medical problems listed below. Physiatrist and rehab team continue to assess barriers to discharge/monitor patient progress toward functional and medical goals. FIM:       Function - Toileting Toileting activity did not occur: No continent bowel/bladder event                          Medical Problem List and Plan: 1.Left hemiparesis, left neglect, left hemisensory deficitssecondary to right MCA infarction/distal M1 segment occlusion status post revascularizationas well as history of traumatic SDH September 2019 with follow-up CT the head 05/10/2017 showed SDH  completely reabsorbed.  CIR PT, OT, SLP evals 2/15 2. DVT Prophylaxis/Anticoagulation: Subcutaneous Lovenox. Monitor platelet counts in any signs of bleeding.Plan to beginELIQUIS  06/09/2017 5 mg twice a day due to size of infarction. No need to reimage. Discontinue Lovenox and aspirin onceELIQUISresumed 3. Pain Management:Lyrica 100 mg twice a day, Tylenol as needed 4. Mood:Provide emotional support 5. Neuropsych: This patientiscapable of making decisions on hisown behalf. 6. Skin/Wound Care:Routine skin checks 7. Fluids/Electrolytes/Nutrition:Routine I&O's low intake may need IVF but BMET shows stable BUN and Creat 8.Dysphagia. Dysphagia #1 nectar liquids.Fluctuating level of alertness may influence swallow fxn  Follow-up speech therapy 9.Hypertension. Tenormin 50 mg daily. Monitor with increased mobility 10.Atrial fibrillation with history of pacemaker. Cardiac rate controlled. Patient [facility]. 11.Hypothyroidism. Synthroid 12.Hyperlipidemia.  Zocor   LOS (Days) 1 A FACE TO FACE EVALUATION WAS PERFORMED  Charlett Blake 05/31/2017, 6:35 AM

## 2017-05-31 NOTE — Evaluation (Signed)
Physical Therapy Assessment and Plan  Patient Details  Name: Derek Hays MRN: [id number] Date of Birth: 22-Jun-1930  PT Diagnosis: Abnormal posture, Abnormality of gait, Hemiplegia non-dominant, Hypotonia, Impaired cognition, Impaired sensation and Muscle weakness Rehab Potential:   ELOS:     Today's Date: 05/31/2017 PT Individual Time: 0900-1000 AND 1630-1640 PT Individual Time Calculation (min): 60 min  And 10 min   Problem List:  Patient Active Problem List   Diagnosis Date Noted  . 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 Stroke (cerebrum) ([place]) 05/27/2017    Past Medical History:  Past Medical History:  Diagnosis Date  . Atrial fibrillation ([place])   . Hypertension    Past Surgical History:  Past Surgical History:  Procedure Laterality Date  . IR CT HEAD LTD  05/27/2017  . IR PERCUTANEOUS ART THROMBECTOMY/INFUSION INTRACRANIAL INC DIAG ANGIO  05/27/2017  . RADIOLOGY WITH ANESTHESIA N/A 05/27/2017   Procedure: RADIOLOGY WITH ANESTHESIA;  Surgeon: Luanne Bras, MD;  Location: [facility];  Service: Radiology;  Laterality: N/A;    Assessment & Plan Clinical Impression: Patient is a 84 y.o.right handed malewith history of hypertension, atrial fibrillation/pacemaker2016 on Eliquisin the past but discontinuedafter traumatic SDH September 2018 with repeat head CT scan 05/10/2017 showed SDH completely reabsorbedfollowed by Dr Bridgette Habermann [facility]. Per chart review patient lives with spouse. Independent using a rolling walker/cane and still driving. One level home with one step to entry. Presented2/11/2019with acute onset of left-sided weakness as well as slurred speech. CT of the head positive for hyperdense right MCA. No hemorrhage or mass effect. Subacute appearing left PCA territory infarct. CT cerebral perfusion scan emergent large vessel occlusion of the distal M1 segment of the right middle cerebral artery.  Underwent revascularization per interventional radiology.Echocardiogram with ejection fraction 15% grade 2 diastolic dysfunction.MRI/MRA02/03/2018 acute right MCA infarct involving the right insula and frontal parietal lobe. Small acute or subacute infarct left parietal lobe.Marland KitchenMRA status post revascularization of right middle cerebral artery occlusion. There remain moderate to severe stenosis proximal right M2 segment which felt to be due to underlying atherosclerotic disease.Presently on aspirin for CVA prophylaxisand planned is to resume ELIQUISin10 days due to size of infarction.  Patient transferred to CIR on 05/30/2017 .   Patient currently requires total with mobility secondary to muscle weakness and muscle paralysis, decreased cardiorespiratoy endurance, abnormal tone, unbalanced muscle activation, motor apraxia, decreased coordination and decreased motor planning, decreased visual acuity, decreased attention to left, left side neglect, decreased motor planning and ideational apraxia, decreased initiation, decreased attention, decreased awareness, decreased problem solving, decreased safety awareness, decreased memory and delayed processing and decreased sitting balance, decreased standing balance, decreased postural control, hemiplegia and decreased balance strategies.  Prior to hospitalization, patient was modified independent  with mobility and lived with   in a House home.  Home access is 1Stairs to enter.  Patient will benefit from skilled PT intervention to maximize safe functional mobility, minimize fall risk and decrease caregiver burden for planned discharge home with 24 hour assist.  Anticipate patient will benefit from follow up [facility] [place] at discharge.     Skilled Therapeutic Intervention PT instructed patient in PT Evaluation and initiated treatment intervention; see below for results. PT educated patient in [place], rehab potential, rehab goals, and discharge recommendations. Pt left sitting  in TIS WC with call bell in reach an all needs met.   Session 2.  Pt received supine in bed, asleep. Pt aroused with significant effort  from PT. Pt attempted to  instructed in rolling R and L, poor attention to task due to fatigue and lethargy. Pt left supine in bed with all needs met.       PT Evaluation Precautions/Restrictions   fall, pushers syndrome General   Vital Signs  Pain   Home Living/Prior Functioning Home Living Available Help at Discharge: Family;Available 24 hours/day Type of Home: House Home Access: Stairs to enter [employer] of Steps: 1 Home Layout: One level Bathroom Shower/Tub: Multimedia programmer: Standard(BSC) Prior Function Level of Independence: Requires assistive device for independence  Able to Take Stairs?: Yes Driving: Yes Comments: ADLs, iADLs, driving. using RW, rollator, and cane for mobility Vision/Perception  Vision - Assessment Eye Alignment: Impaired (comment) Ocular Range of Motion: Restricted on the left Alignment/Gaze Preference: Head turned(R) Perception Perception: Impaired Inattention/Neglect: Does not attend to right visual field;Does not attend to right side of body Praxis Praxis: Impaired Praxis Impairment Details: Initiation  Cognition Overall Cognitive Status: Impaired/Different from baseline Arousal/Alertness: Awake/alert Attention: Selective Sustained Attention: Appears intact Selective Attention: Appears intact Memory: Impaired Memory Impairment: Decreased recall of new information;Prospective memory Awareness: Impaired Awareness Impairment: Emergent impairment;Anticipatory impairment Problem Solving: Impaired Problem Solving Impairment: Verbal basic;Functional basic Safety/Judgment: Impaired Sensation Sensation Light Touch: Impaired Detail Light Touch Impaired Details: Absent LUE;Absent LLE Proprioception: Impaired Detail Proprioception Impaired Details: Impaired LUE;Impaired  LLE Coordination Gross Motor Movements are Fluid and Coordinated: No Fine Motor Movements are Fluid and Coordinated: No Coordination and Movement Description: Dense L hemiplegia Finger Nose Finger Test: Decreased speed and accuracy R UE, unable to perform L UE due to faccidity Motor  Motor Motor: Hemiplegia;Abnormal tone;Abnormal postural alignment and control;Motor apraxia;Motor perseverations Motor - Skilled Clinical Observations: Dense  hemiplegia  Mobility Bed Mobility Bed Mobility: Rolling Right;Rolling Left;Supine to Sit;Sit to Supine Rolling Right: 2: Max assist Rolling Right Details (indicate cue type and reason): with rails  Rolling Left Details (indicate cue type and reason): with rail  Supine to Sit: 1: +1 Total assist Sit to Supine: 1: +2 Total assist Transfers Transfers: Yes Lateral/Scoot Transfers: 1: +1 Total assist Locomotion  Ambulation Ambulation: No Gait Gait: No Stairs / Additional Locomotion Stairs: No Wheelchair Mobility Wheelchair Mobility: No  Trunk/Postural Assessment  Cervical Assessment Cervical Assessment: Exceptions to WFL(Head tilt) Thoracic Assessment Thoracic Assessment: Exceptions to WFL(Kyphotic; rounded shoulders) Lumbar Assessment Lumbar Assessment: Exceptions to WFL(Posterior pelvic tilt) Postural Control Postural Control: Deficits on evaluation(Severely delayed- non-existant righting reactions)  Balance Balance Balance Assessed: Yes Static Sitting Balance Static Sitting - Balance Support: Right upper extremity supported Static Sitting - Level of Assistance: 2: Max assist Dynamic Sitting Balance Dynamic Sitting - Balance Support: Right upper extremity supported Dynamic Sitting - Level of Assistance: 2: Max assist;1: +1 Total assist Sitting balance - Comments: Pushers syndrome  Extremity Assessment      RLE Assessment RLE Assessment: Exceptions to [facility] RLE Strength RLE Overall Strength Comments: generalized weakness 4-/5  proximal to distal LLE Assessment LLE Assessment: Exceptions to [facility] LLE Strength LLE Overall Strength Comments: 3/5 hip extension. 2+/5 knee extention. 1/5 hip flexion and ankle DF/PF.    See Function Navigator for Current Functional Status.   Refer to Care Plan for Long Term Goals  Recommendations for other services: None   Discharge Criteria: Patient will be discharged from PT if patient refuses treatment 3 consecutive times without medical reason, if treatment goals not met, if there is a change in medical status, if patient makes no progress towards goals or if patient  is discharged from hospital.  The above assessment, treatment plan, treatment alternatives and goals were discussed and mutually agreed upon: by patient  [name] 05/31/2017, 10:10 AM

## 2017-06-01 ENCOUNTER — Inpatient Hospital Stay (HOSPITAL_COMMUNITY): Payer: TRICARE For Life (TFL)

## 2017-06-01 ENCOUNTER — Inpatient Hospital Stay (HOSPITAL_COMMUNITY): Payer: TRICARE For Life (TFL) | Admitting: Physical Therapy

## 2017-06-01 NOTE — Progress Notes (Signed)
Subjective/Complaints: No new complaints. Slept well.   ROS: pt denies nausea, vomiting, diarrhea, cough, shortness of breath or chest pain   Objective: Vital Signs: Blood pressure (!) 153/81, pulse 60, temperature 97.7 F (36.5 C), temperature source Oral, resp. rate 18, height 6' (1.829 m), weight 100.5 kg (221 lb 9 oz), SpO2 100 %. No results found. Results for orders placed or performed during the hospital encounter of 05/30/17 (from the past 72 hour(s))  CBC WITH DIFFERENTIAL     Status: Abnormal   Collection Time: 05/31/17  6:51 AM  Result Value Ref Range   WBC 8.2 4.0 - 10.5 K/uL   RBC 4.06 (L) 4.22 - 5.81 MIL/uL   Hemoglobin 12.1 (L) 13.0 - 17.0 g/dL   HCT 36.6 (L) 39.0 - 52.0 %   MCV 90.1 78.0 - 100.0 fL   MCH 29.8 26.0 - 34.0 pg   MCHC 33.1 30.0 - 36.0 g/dL   RDW 13.3 11.5 - 15.5 %   Platelets 182 150 - 400 K/uL   Neutrophils Relative % 65 %   Neutro Abs 5.4 1.7 - 7.7 K/uL   Lymphocytes Relative 21 %   Lymphs Abs 1.8 0.7 - 4.0 K/uL   Monocytes Relative 10 %   Monocytes Absolute 0.8 0.1 - 1.0 K/uL   Eosinophils Relative 3 %   Eosinophils Absolute 0.3 0.0 - 0.7 K/uL   Basophils Relative 1 %   Basophils Absolute 0.0 0.0 - 0.1 K/uL    Comment: Performed at [facility], [address] [place], [place] [address]  Comprehensive metabolic panel     Status: Abnormal   Collection Time: 05/31/17  6:51 AM  Result Value Ref Range   Sodium 138 135 - 145 mmol/L   Potassium 3.9 3.5 - 5.1 mmol/L   Chloride 106 101 - 111 mmol/L   CO2 23 22 - 32 mmol/L   Glucose, Bld 111 (H) 65 - 99 mg/dL   BUN 20 6 - 20 mg/dL   Creatinine, Ser 1.30 (H) 0.61 - 1.24 mg/dL   Calcium 8.7 (L) 8.9 - 10.3 mg/dL   Total Protein 5.4 (L) 6.5 - 8.1 g/dL   Albumin 2.9 (L) 3.5 - 5.0 g/dL   AST 24 15 - 41 U/L   ALT 12 (L) 17 - 63 U/L   Alkaline Phosphatase 68 38 - 126 U/L   Total Bilirubin 0.9 0.3 - 1.2 mg/dL   GFR calc non Af Amer 49 (L) >60 mL/min   GFR calc Af Amer 56 (L) >60 mL/min   Comment: (NOTE) The eGFR has been calculated using the CKD EPI equation. This calculation has not been validated in all clinical situations. eGFR's persistently <60 mL/min signify possible Chronic Kidney Disease.    Anion gap 9 5 - 15    Comment: Performed at [facility] [address], [place],  [address]     HEENT: Exhibits pocketing during feeding Cardio: RRR without murmur. No JVD  Resp: CTA Bilaterally without wheezes or rales. Normal effort  GI: BS positive and Nontender and non-distended Extremity:  Edema Dorsum of left hand and wrist Skin:   Other Multiple ecchymoses left upper extremity Neuro: Alert/Oriented, Abnormal Sensory Decreased sensation left upper and left lower limb, Abnormal Motor 0/5 left upper extremity, 2- hip knee extensor synergy in the left lower extremity otherwise 0.  Right side is normal, Tone:  Hypotonia, Dysarthric and Inattention---stable Musc/Skel:  Other No pain with upper or lower limb range of motion General  no acute distress   Assessment/Plan: 1. 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 Physiatrist is providing close team supervision and 24 hour management of active medical problems listed below. Physiatrist and rehab team continue to assess barriers to discharge/monitor patient progress toward functional and medical goals. FIM: Function - Bathing Position: Wheelchair/chair at sink Body parts bathed by patient: Abdomen, Chest Body parts bathed by helper: Right arm, Right lower leg, Left arm, Left lower leg, Front perineal area, Buttocks, Right upper leg, Left upper leg, Back Assist Level: 2 helpers  Function- Upper Body Dressing/Undressing What is the patient wearing?: Hospital gown Assist Level: (Total A) Function - Lower Body  Dressing/Undressing What is the patient wearing?: Non-skid slipper socks Position: (No pants available, however, would be total A +2 bed level) Non-skid slipper socks- Performed by helper: Don/doff right sock, Don/doff left sock Assist for footwear: Dependant  Function - Toileting Toileting activity did not occur: N/A(gown) Toileting steps completed by helper: Adjust clothing prior to toileting, Performs perineal hygiene, Adjust clothing after toileting Assist level: Two helpers(Bed level total A)  Function - Air cabin crew transfer activity did not occur: Safety/medical concerns  Function - Chair/bed transfer Chair/bed transfer method: Lateral scoot Chair/bed transfer assist level: Maximal assist (Pt 25 - 49%/lift and lower) Chair/bed transfer assistive device: Sliding board, Armrests  Function - Locomotion: Wheelchair Will patient use wheelchair at discharge?: Yes Type: Manual Wheelchair activity did not occur: Safety/medical concerns Wheel 50 feet with 2 turns activity did not occur: Safety/medical concerns Wheel 150 feet activity did not occur: Safety/medical concerns Function - Locomotion: Ambulation Ambulation activity did not occur: Safety/medical concerns Walk 10 feet activity did not occur: Safety/medical concerns Walk 50 feet with 2 turns activity did not occur: Safety/medical concerns Walk 150 feet activity did not occur: Safety/medical concerns Walk 10 feet on uneven surfaces activity did not occur: Safety/medical concerns  Function - Comprehension Comprehension: Auditory Comprehension assistive device: Hearing aids Comprehension assist level: Understands basic 50 - 74% of the time/ requires cueing 25 - 49% of the time  Function - Expression Expression: Verbal Expression assist level: Expresses basic 75 - 89% of the time/requires cueing 10 - 24% of the time. Needs helper to occlude trach/needs to repeat words.  Function - Social Interaction Social  Interaction assist level: Interacts appropriately 75 - 89% of the time - Needs redirection for appropriate language or to initiate interaction.  Function - Problem Solving Problem solving assist level: Solves basic 25 - 49% of the time - needs direction more than half the time to initiate, plan or complete simple activities  Function - Memory Memory assist level: Recognizes or recalls 75 - 89% of the time/requires cueing 10 - 24% of the time Patient normally able to recall (first 3 days only): That he or she is in a hospital  Medical Problem List and Plan: 1.Left hemiparesis, left neglect, left hemisensory deficitssecondary to right MCA infarction/distal M1 segment occlusion status post revascularizationas well as history of traumatic SDH September 2019 with follow-up CT the head 05/10/2017 showed SDH completely reabsorbed.  CIR PT, OT, SLP ongoing 2. DVT Prophylaxis/Anticoagulation: Subcutaneous Lovenox. Monitor platelet counts in any signs of bleeding.Plan to beginELIQUIS  06/09/2017 5 mg twice a day due to size of infarction. No need to reimage. Discontinue Lovenox and aspirin onceELIQUISresumed 3. Pain Management:Lyrica 100 mg twice  a day, Tylenol as needed 4. Mood:Provide emotional support 5. Neuropsych: This patientiscapable of making decisions on hisown behalf. 6. Skin/Wound Care:Routine skin checks 7. Fluids/Electrolytes/Nutrition:Routine I&O's low intake may need IVF but BMET shows stable BUN and Creat 8.Dysphagia. Dysphagia #1 nectar liquids.  -continue to follow for tolerance 9.Hypertension. Tenormin 50 mg daily.  Fair control at present 10.Atrial fibrillation with history of pacemaker. Cardiac rate controlled. Patient to follow-up with [facility]. 11.Hypothyroidism. Synthroid 12.Hyperlipidemia. Zocor   LOS (Days) 2 A FACE TO FACE EVALUATION WAS PERFORMED  Leilene Diprima T 2020-10-617, 1:00 PM

## 2017-06-01 NOTE — Plan of Care (Signed)
  Not Progressing RH BOWEL ELIMINATION RH STG MANAGE BOWEL WITH ASSISTANCE Description STG Manage Bowel with Mod Assistance.    09/30/2017 1041 - Not Progressing by Brita Romp, RN Note Pt LBM 05/28/17 sorbitol given today RH STG MANAGE BOWEL W/MEDICATION W/ASSISTANCE Description STG Manage Bowel with Medication with Max Assistance.   09/30/2017 1041 - Not Progressing by Brita Romp, RN Note Pt LBM 05/28/17 sorbitol given today

## 2017-06-01 NOTE — Progress Notes (Signed)
Physical Therapy Session Note  Patient Details  Name: Derek Hays MRN: [id number] Date of Birth: 12/06/1930  Today's Date: 06/01/2017 PT Individual Time: 1415-1515 PT Individual Time Calculation (min): 60 min   Short Term Goals: Week 1:  PT Short Term Goal 1 (Week 1): Pt will perform sit<>supine with max assist  PT Short Term Goal 2 (Week 1): Pt will maintain sitting balance x 1 min with mod assist  PT Short Term Goal 3 (Week 1): Pt will initiate WC mobility  PT Short Term Goal 4 (Week 1): Pt will transfer to WC with max assist of 1   Skilled Therapeutic Interventions/Progress Updates:   Pt received sitting in WC and agreeable to PT. PT treatment focused on arousal and postural control. SB transfer to and from mat table with +2 total assist. Pt sitting balance with R lateral weight shift to tap shoulder on wall(~5 inch from midline) and return to midline 2 x 15. Max cues for attention to the L with forced visual scanning and thoracic/cervical extension to name objects on L. Sustained sitting EOM with mirror feed back, Pt able to maintain midline sitting with only supervision support up to 30 sec. Patient returned to room and left sitting in TIS WC with call bell in reach and all needs met.         Therapy Documentation Precautions:  Precautions Precautions: Fall Precaution Comments:  neglect Restrictions Weight Bearing Restrictions: No   Pain: Denies   See Function Navigator for Current Functional Status.   Therapy/Group: Individual Therapy   E  06/01/2017, 6:04 PM  

## 2017-06-01 NOTE — Progress Notes (Signed)
Physical Therapy Session Note  Patient Details  Name: KARRIEM MUENCH MRN: [id number] Date of Birth: 11/22/1930  Today's Date: 11/15/202019 PT Individual Time: 1050-1130 PT Individual Time Calculation (min): 40 min   Short Term Goals: Week 1:  PT Short Term Goal 1 (Week 1): Pt will perform sit<>supine with max assist  PT Short Term Goal 2 (Week 1): Pt will maintain sitting balance x 1 min with mod assist  PT Short Term Goal 3 (Week 1): Pt will initiate WC mobility  PT Short Term Goal 4 (Week 1): Pt will transfer to [facility] with max assist of 1   Skilled Therapeutic Interventions/Progress Updates: Pt presented in bed with NT present feeding breakfast, pt agreeable to therapy. Pt agreeable to attempt SB transfer to [place] chair. Performed supine to sit with HOB elevated. Pt required total assist for LLE management, and maxA for supine to sit for truncal support. Once in sitting pt unable to maintain sitting balance without support x1. Pt provided with max multimodal cues for achieving neutral head with pt unable to maintain. Pt able to achieve sitting balance with mod support x 1 for approx 2 min to allow PTA for SB set up. Performed SB transfer to L maxA x 2 with PTA providing max multimodal cues for increasing L lateral lean to improve head hips relationship. Pt transported to rehab gym and participated in reaching and for playing cards identifying and crossing midline to place on back of mirror. Pt able to identify 5/5 cards however accurately placed 2/5. Pt returned to room and QRB placed, pt then handed off to SLP for next session.      Therapy Documentation Precautions:  Precautions Precautions: Fall Precaution Comments:  neglect Restrictions Weight Bearing Restrictions: No General:   Vital Signs:  Pain: Pain Assessment Pain Assessment: No/denies pain   See Function Navigator for Current Functional Status.   Therapy/Group: Individual Therapy  Kavish Lafitte  Dayle Mcnerney,  PTA  11/15/202019, 12:27 PM

## 2017-06-01 NOTE — Progress Notes (Signed)
Speech Language Pathology Daily Session Note  Patient Details  Name: Derek Hays MRN: [id number] Date of Birth: [birth year]  Today's Date: 2020/06/1417 SLP Individual Time: 1130-1200 SLP Individual Time Calculation (min): 30 min  Short Term Goals: Week 1: SLP Short Term Goal 1 (Week 1): Pt will consume dysphagia 1 with nectar thick liquids with minimal overt s/s of aspiration and Mod A cues for use of compensatory swallow strategies.  SLP Short Term Goal 2 (Week 1): Pt will consume trials of thin liquids with Mod A cues for chin tuck and minimal overt s/s of aspiration.  SLP Short Term Goal 3 (Week 1): Pt will demonstrate intellectual awareness by answering yes/no questions regarding physical and swallowing deficits with Mod A cues.  SLP Short Term Goal 4 (Week 1): Pt will scan to midline to locate objects in 50% of opportunities and Max A cues.  SLP Short Term Goal 5 (Week 1): Pt will complete basic familiar tasks with Min A cues.  Skilled Therapeutic Interventions:Skilled ST services focused on swallow and cognitive skills. SLP facilitated PO consumption of late breakfast and then lunch tray of dys 1 and NTL.Pt required Mod A verbal cues to utilize swallow strategies with intermittent throat clear and demonstrated 2 overt s/s aspiration with delayed cough following mxture of soilds and liquids. Pt required max A verbal cues to wipe left labial due to spillage during PO consumption. Pt required max-total assistance locating items left of midline on tray. SLP attempted basic 3 step and then 2 step sequence card to address problem solving skills, however pt demonstrated impairment in understanding requested actions. SLP notfied nurse to finish supervision of meal. Pt was left in room with call bell within reach. Reccomend to continue skilled ST services.      Function:  Eating Eating   Modified Consistency Diet: Yes Eating Assist Level: Set up assist for;Supervision or verbal cues;Helper  feeds patient;Helper checks for pocketed food;Helper scoops food on utensil;Help managing cup/glass;Helper brings food to mouth;More than reasonable amount of time           Cognition Comprehension Comprehension assist level: Understands basic 50 - 74% of the time/ requires cueing 25 - 49% of the time  Expression   Expression assist level: Expresses basic 75 - 89% of the time/requires cueing 10 - 24% of the time. Needs helper to occlude trach/needs to repeat words.  Social Interaction Social Interaction assist level: Interacts appropriately 75 - 89% of the time - Needs redirection for appropriate language or to initiate interaction.  Problem Solving Problem solving assist level: Solves basic 25 - 49% of the time - needs direction more than half the time to initiate, plan or complete simple activities  Memory Memory assist level: Recognizes or recalls 75 - 89% of the time/requires cueing 10 - 24% of the time    Pain Pain Assessment Pain Assessment: No/denies pain  Therapy/Group: Individual Therapy  Trindon Dorton  [facility] 2020/06/1417, 12:14 PM

## 2017-06-01 NOTE — Progress Notes (Signed)
Occupational Therapy Session Note  Patient Details  Name: Derek Hays MRN: [id number] Date of Birth: 1931-03-24  Today's Date: 08-06-202019 OT Individual Time: 7017-7939 OT Individual Time Calculation (min): 60 min    Short Term Goals: Week 1:  OT Short Term Goal 1 (Week 1): Pt will transfer with assist +1 in order to decrease caregiver burden OT Short Term Goal 2 (Week 1): Pt will attend to L UE during bathing task with mod cuing OT Short Term Goal 3 (Week 1): Pt will sit EOB with mod A in prep for functional task  Skilled Therapeutic Interventions/Progress Updates:    1:1. Pt received in TIS asleep but easily aroused. OT propels w/c to/from all tx spaces for energy conservation/time management. Pt sits upright in TIS at dynavision for 2x2 min visual scanning with MAX multimodal cueing for first round and MAX VC for second round to locate lights/turn head in bottom 2 quadrants. Pt requires 18.8 seconds to locate items on L and 5.1 seconds to locate buttons on R. Pt completes slide board transfer total +2 using chuck pad under buttocks to decrease sheering forces with VC for hand placement throughout transfer and head hips relationship. Pt sits EOM with overall mod and occasional min A for sitting balance with RUE weight bearing onto forearm on block and OT support upright posture/elongation of L side body with OT's LE behind patient. Pt reaches outside R BOS to obtain clothespin to facilitate weight shifting R and crosses midline to L to place on basketball net above shoulder height to encourage cervical extension/rotation to L during dynamic reaching task. Exited session with pt semi reclined in bed, LUE supported on pillow and bed exit alarm on/call bell in reach.   Therapy Documentation Precautions:  Precautions Precautions: Fall Precaution Comments:  neglect Restrictions Weight Bearing Restrictions: No  See Function Navigator for Current Functional Status.   Therapy/Group:  Individual Therapy  Tonny Branch 08-06-202019, 4:54 PM

## 2017-06-02 ENCOUNTER — Inpatient Hospital Stay (HOSPITAL_COMMUNITY): Payer: TRICARE For Life (TFL)

## 2017-06-02 ENCOUNTER — Encounter (HOSPITAL_COMMUNITY): Payer: Self-pay

## 2017-06-02 ENCOUNTER — Inpatient Hospital Stay (HOSPITAL_COMMUNITY): Payer: TRICARE For Life (TFL) | Admitting: Occupational Therapy

## 2017-06-02 DIAGNOSIS — R1312 Dysphagia, oropharyngeal phase: Secondary | ICD-10-CM

## 2017-06-02 DIAGNOSIS — K5901 Slow transit constipation: Secondary | ICD-10-CM

## 2017-06-02 MED ORDER — ASPIRIN 300 MG RE SUPP
300.0000 mg | Freq: Every day | RECTAL | Status: DC
  Filled 2017-06-02: qty 1

## 2017-06-02 MED ORDER — SORBITOL 70 % SOLN
30.0000 mL | Status: AC
  Administered 2017-06-02: 30 mL via ORAL
  Filled 2017-06-02: qty 30

## 2017-06-02 MED ORDER — BISACODYL 10 MG RE SUPP
10.0000 mg | Freq: Every day | RECTAL | Status: DC | PRN
  Administered 2017-06-10: 10 mg via RECTAL
  Filled 2017-06-02: qty 1

## 2017-06-02 MED ORDER — ASPIRIN 325 MG PO TABS
325.0000 mg | ORAL_TABLET | Freq: Every day | ORAL | Status: DC
  Administered 2017-06-02 – 2017-06-09 (×8): 325 mg via ORAL
  Filled 2017-06-02 (×8): qty 1

## 2017-06-02 NOTE — Progress Notes (Signed)
Subjective/Complaints: Nurse reports that patient's liquid intake is poor and that his urine appears somewhat concentrated.  Patient denies complaints today.  Patient is constipated  ROS: pt denies nausea, vomiting, diarrhea, cough, shortness of breath or chest pain   Objective: Vital Signs: Blood pressure 112/69, pulse 60, temperature 97.6 F (36.4 C), temperature source Oral, resp. rate 18, height 6' (1.829 m), weight 100.5 kg (221 lb 9 oz), SpO2 98 %. No results found. Results for orders placed or performed during the hospital encounter of 05/30/17 (from the past 72 hour(s))  CBC WITH DIFFERENTIAL     Status: Abnormal   Collection Time: 05/31/17  6:51 AM  Result Value Ref Range   WBC 8.2 4.0 - 10.5 K/uL   RBC 4.06 (L) 4.22 - 5.81 MIL/uL   Hemoglobin 12.1 (L) 13.0 - 17.0 g/dL   HCT 36.6 (L) 39.0 - 52.0 %   MCV 90.1 78.0 - 100.0 fL   MCH 29.8 26.0 - 34.0 pg   MCHC 33.1 30.0 - 36.0 g/dL   RDW 13.3 11.5 - 15.5 %   Platelets 182 150 - 400 K/uL   Neutrophils Relative % 65 %   Neutro Abs 5.4 1.7 - 7.7 K/uL   Lymphocytes Relative 21 %   Lymphs Abs 1.8 0.7 - 4.0 K/uL   Monocytes Relative 10 %   Monocytes Absolute 0.8 0.1 - 1.0 K/uL   Eosinophils Relative 3 %   Eosinophils Absolute 0.3 0.0 - 0.7 K/uL   Basophils Relative 1 %   Basophils Absolute 0.0 0.0 - 0.1 K/uL    Comment: Performed at [facility], [address], [place], [place] [address]  Comprehensive metabolic panel     Status: Abnormal   Collection Time: 05/31/17  6:51 AM  Result Value Ref Range   Sodium 138 135 - 145 mmol/L   Potassium 3.9 3.5 - 5.1 mmol/L   Chloride 106 101 - 111 mmol/L   CO2 23 22 - 32 mmol/L   Glucose, Bld 111 (H) 65 - 99 mg/dL   BUN 20 6 - 20 mg/dL   Creatinine, Ser 1.30 (H) 0.61 - 1.24 mg/dL   Calcium 8.7 (L) 8.9 - 10.3 mg/dL   Total Protein 5.4 (L) 6.5 - 8.1 g/dL   Albumin 2.9 (L) 3.5 - 5.0 g/dL   AST 24 15 - 41 U/L   ALT 12 (L) 17 - 63 U/L   Alkaline Phosphatase 68 38 - 126 U/L    Total Bilirubin 0.9 0.3 - 1.2 mg/dL   GFR calc non Af Amer 49 (L) >60 mL/min   GFR calc Af Amer 56 (L) >60 mL/min    Comment: (NOTE) The eGFR has been calculated using the CKD EPI equation. This calculation has not been validated in all clinical situations. eGFR's persistently <60 mL/min signify possible Chronic Kidney Disease.    Anion gap 9 5 - 15    Comment: Performed at [facility] [address], [place], [place] [address]     HEENT: Exhibits pocketing during feeding Cardio: RRR without murmur. No JVD  Resp: CTA Bilaterally without wheezes or rales. Normal effort   GI: BS positive and Nontender and non-distended Extremity:  Edema Dorsum of left hand and wrist Skin:   Other Multiple ecchymoses left upper extremity Neuro: Alert/Oriented, Abnormal Sensory Decreased sensation left upper and left lower limb, Abnormal Motor 0/5 left upper extremity, 2- hip knee extensor synergy in the left lower extremity otherwise 0.  Right side is normal,  Tone:  Hypotonia, Dysarthric and Inattention--- no changes Musc/Skel:  Other No pain with upper or lower limb range of motion General no acute distress   Assessment/Plan: 1. 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 Physiatrist is providing close team supervision and 24 hour management of active medical problems listed below. Physiatrist and rehab team continue to assess barriers to discharge/monitor patient progress toward functional and medical goals. FIM: Function - Bathing Position: Wheelchair/chair at sink Body parts bathed by patient: Abdomen, Chest Body parts bathed by helper: Right arm, Right lower leg, Left arm, Left lower leg, Front perineal area, Buttocks, Right upper leg, Left upper leg, Back Assist Level: 2 helpers  Function-  Upper Body Dressing/Undressing What is the patient wearing?: Hospital gown Assist Level: (Total A) Function - Lower Body Dressing/Undressing What is the patient wearing?: Non-skid slipper socks Position: (No pants available, however, would be total A +2 bed level) Non-skid slipper socks- Performed by helper: Don/doff right sock, Don/doff left sock Assist for footwear: Dependant  Function - Toileting Toileting activity did not occur: N/A(gown) Toileting steps completed by helper: Adjust clothing prior to toileting, Performs perineal hygiene, Adjust clothing after toileting Assist level: Two helpers  Function - Air cabin crew transfer activity did not occur: Safety/medical concerns  Function - Chair/bed transfer Chair/bed transfer method: Lateral scoot Chair/bed transfer assist level: 2 helpers Chair/bed transfer assistive device: Sliding board, Armrests  Function - Locomotion: Wheelchair Will patient use wheelchair at discharge?: Yes Type: Manual Wheelchair activity did not occur: Safety/medical concerns Wheel 50 feet with 2 turns activity did not occur: Safety/medical concerns Wheel 150 feet activity did not occur: Safety/medical concerns Function - Locomotion: Ambulation Ambulation activity did not occur: Safety/medical concerns Walk 10 feet activity did not occur: Safety/medical concerns Walk 50 feet with 2 turns activity did not occur: Safety/medical concerns Walk 150 feet activity did not occur: Safety/medical concerns Walk 10 feet on uneven surfaces activity did not occur: Safety/medical concerns  Function - Comprehension Comprehension: Visual Comprehension assistive device: Hearing aids Comprehension assist level: Understands basic 50 - 74% of the time/ requires cueing 25 - 49% of the time  Function - Expression Expression: Verbal Expression assist level: Expresses basic 75 - 89% of the time/requires cueing 10 - 24% of the time. Needs helper to occlude  trach/needs to repeat words.  Function - Social Interaction Social Interaction assist level: Interacts appropriately 75 - 89% of the time - Needs redirection for appropriate language or to initiate interaction.  Function - Problem Solving Problem solving assist level: Solves basic 25 - 49% of the time - needs direction more than half the time to initiate, plan or complete simple activities  Function - Memory Memory assist level: Recognizes or recalls 75 - 89% of the time/requires cueing 10 - 24% of the time Patient normally able to recall (first 3 days only): That he or she is in a hospital  Medical Problem List and Plan: 1.Left hemiparesis, left neglect, left hemisensory deficitssecondary to right MCA infarction/distal M1 segment occlusion status post revascularizationas well as history of traumatic SDH September 2019 with follow-up CT the head 05/10/2017 showed SDH completely reabsorbed.  CIR PT, OT, SLP ongoing 2. DVT Prophylaxis/Anticoagulation: Subcutaneous Lovenox. Monitor platelet counts in any signs of bleeding.Plan to beginELIQUIS  06/09/2017 5 mg twice a day due to size of infarction. No need  to reimage. Discontinue Lovenox and aspirin onceELIQUISresumed 3. Pain Management:Lyrica 100 mg twice a day, Tylenol as needed 4. Mood:Provide emotional support 5. Neuropsych: This patientiscapable of making decisions on hisown behalf. 6. Skin/Wound Care:Routine skin checks 7. Fluids/Electrolytes/Nutrition:Routine I&O's low intake may need IVF but BMET shows stable BUN and Creat 8.Dysphagia. Dysphagia #1 nectar liquids.  -continue to follow for tolerance   -Nurse will work with patient to improve fluid intake today.  Recheck labs tomorrow morning. 9.Hypertension. Tenormin 50 mg daily.  Fair control at present 10.Atrial fibrillation with history of pacemaker. Cardiac rate controlled. Patient to follow-up with Dr.Fargencardiology services [facility]. 11.Hypothyroidism.  Synthroid 12.Hyperlipidemia. Zocor 13.  Constipation: lax/soft + Dulcolax suppository today if needed.   LOS (Days) 3 A FACE TO FACE EVALUATION WAS PERFORMED  SWARTZ,ZACHARY T 06/02/2017, 9:31 AM

## 2017-06-02 NOTE — Progress Notes (Signed)
Occupational Therapy Session Note  Patient Details  Name: Derek Hays MRN: [id number] Date of Birth: [birth year]  Today's Date: 06/02/2017 OT Individual Time: 3709-6438 OT Individual Time Calculation (min): 85 min   Skilled Therapeutic Interventions/Progress Updates:    1:1. Rn requesting to get patient on [facility] to attempt to void bowel again. Throughout session pt rolls to place/remove sling and don new brief with MIN A to roll L (multi modal cueing to cross midline to find bed rail) and MAX A to roll R. Pt transfers onto [facility] with total A in maxi move to void bowel with increased time. Pt returns to bed in same manner for hygiene and bathing. Pt bathes UB in bed with A to wash RUE and hold LUE for pt to actively wash. Pt retrieve items from OT on L side with VC to turn head when reaching for objects to attend to L visual field as pt will just reach aimlessly to L without cueing. Pt applies lotion to LUE for attention/sensory input. OT bathes buttocks and BLE in bed/applies socks. OT educates family and pt on stroke recovery process, subluxation, sensory changes, weight bearing and bed positioning. Exited session with pt seated in bed, call light in reach and all need met.   Therapy Documentation Precautions:  Precautions Precautions: Fall Precaution Comments:  neglect Restrictions Weight Bearing Restrictions: No  See Function Navigator for Current Functional Status.   Therapy/Group: Individual Therapy  Tonny Branch 06/02/2017, 5:09 PM

## 2017-06-02 NOTE — Plan of Care (Signed)
  Not Progressing RH BOWEL ELIMINATION RH STG MANAGE BOWEL WITH ASSISTANCE Description STG Manage Bowel with Mod Assistance.    06/02/2017 4166 - Not Progressing by Brita Romp, RN RH STG MANAGE BOWEL W/MEDICATION W/ASSISTANCE Description STG Manage Bowel with Medication with Max Assistance.   06/02/2017 0630 - Not Progressing by Brita Romp, RN   No BM since 05/28/17. Sorbitol given 06/01/17 and 06/02/17

## 2017-06-02 NOTE — Progress Notes (Signed)
Occupational Therapy Session Note  Patient Details  Name: TRAVER MECKES MRN: [id number] Date of Birth: 1931-01-27  Today's Date: 06/02/2017 OT Individual Time: 1115-1200 OT Individual Time Calculation (min): 45 min    Short Term Goals: Week 1:  OT Short Term Goal 1 (Week 1): Pt will transfer with assist +1 in order to decrease caregiver burden OT Short Term Goal 2 (Week 1): Pt will attend to L UE during bathing task with mod cuing OT Short Term Goal 3 (Week 1): Pt will sit EOB with mod A in prep for functional task  Skilled Therapeutic Interventions/Progress Updates:    Pt seen for OT session focusing on functional mobility and upright tolerance. Pt asleep in supine upon arrival, easily awoken and agreeable to tx session. He kept eyes closed majority of session, however, remained easily aroused and stated he was awake during session he just preferred to have eyes closed.  He rolled in bed with initially requiring +2 for both directions, however, after several trial, progressed to min A rolling to L with max cuing and +2 for rolling to R. Pt unable to turn head past midline to L when rolling despite max multimodal cuing.  Upon rolling to don pants and brief, pt voiced need for BM. Attempted to have pt use bed pan and raise HOB in sitting position, however, pt unable to void in this postion. Therefore, doffed pants and brief and completed bare bottom maximove transfer to [facility] with +2 assist. Pt left seated on BSC, supported in maximove sling/ device and hand off to RN.  Therapy Documentation Precautions:  Precautions Precautions: Fall Precaution Comments:  neglect Restrictions Weight Bearing Restrictions: No Pain:   No/ denies pain  See Function Navigator for Current Functional Status.   Therapy/Group: Individual Therapy  Danaya Geddis L 06/02/2017, 6:49 AM

## 2017-06-03 ENCOUNTER — Inpatient Hospital Stay (HOSPITAL_COMMUNITY): Payer: TRICARE For Life (TFL) | Admitting: Speech Pathology

## 2017-06-03 ENCOUNTER — Inpatient Hospital Stay (HOSPITAL_COMMUNITY): Payer: TRICARE For Life (TFL) | Admitting: Occupational Therapy

## 2017-06-03 ENCOUNTER — Inpatient Hospital Stay (HOSPITAL_COMMUNITY): Payer: TRICARE For Life (TFL)

## 2017-06-03 LAB — BASIC METABOLIC PANEL
Anion gap: 9 (ref 5–15)
BUN: 22 mg/dL — AB (ref 6–20)
CALCIUM: 8.3 mg/dL — AB (ref 8.9–10.3)
CO2: 24 mmol/L (ref 22–32)
CREATININE: 1.31 mg/dL — AB (ref 0.61–1.24)
Chloride: 105 mmol/L (ref 101–111)
GFR calc Af Amer: 56 mL/min — ABNORMAL LOW (ref >60)
GFR, EST NON AFRICAN AMERICAN: 48 mL/min — AB (ref >60)
GLUCOSE: 107 mg/dL — AB (ref 65–99)
POTASSIUM: 4.3 mmol/L (ref 3.5–5.1)
SODIUM: 138 mmol/L (ref 135–145)

## 2017-06-03 LAB — URINALYSIS, ROUTINE W REFLEX MICROSCOPIC
BILIRUBIN URINE: NEGATIVE
Glucose, UA: NEGATIVE mg/dL
HGB URINE DIPSTICK: NEGATIVE
KETONES UR: NEGATIVE mg/dL
Leukocytes, UA: NEGATIVE
Nitrite: NEGATIVE
PROTEIN: NEGATIVE mg/dL
Specific Gravity, Urine: 1.02 (ref 1.005–1.030)
pH: 6 (ref 5.0–8.0)

## 2017-06-03 NOTE — Progress Notes (Signed)
Subjective/Complaints: Difficult to awaken this am Sleeping arouses to physical stim, is HOH  ROS: pt denies nausea, vomiting, diarrhea, cough, shortness of breath or chest pain   Objective: Vital Signs: Blood pressure 118/66, pulse 60, temperature 98.5 F (36.9 C), temperature source Oral, resp. rate 17, height 6' (1.829 m), weight 100.5 kg (221 lb 9 oz), SpO2 98 %. No results found. Results for orders placed or performed during the hospital encounter of 05/30/17 (from the past 72 hour(s))  Basic metabolic panel     Status: Abnormal   Collection Time: 06/03/17  4:33 AM  Result Value Ref Range   Sodium 138 135 - 145 mmol/L   Potassium 4.3 3.5 - 5.1 mmol/L   Chloride 105 101 - 111 mmol/L   CO2 24 22 - 32 mmol/L   Glucose, Bld 107 (H) 65 - 99 mg/dL   BUN 22 (H) 6 - 20 mg/dL   Creatinine, Ser 1.31 (H) 0.61 - 1.24 mg/dL   Calcium 8.3 (L) 8.9 - 10.3 mg/dL   GFR calc non Af Amer 48 (L) >60 mL/min   GFR calc Af Amer 56 (L) >60 mL/min    Comment: (NOTE) The eGFR has been calculated using the CKD EPI equation. This calculation has not been validated in all clinical situations. eGFR's persistently <60 mL/min signify possible Chronic Kidney Disease.    Anion gap 9 5 - 15    Comment: Performed at [facility] [address] [place],  [address]     HEENT: Exhibits pocketing during feeding Cardio: RRR without murmur. No JVD  Resp: CTA Bilaterally without wheezes or rales. Normal effort   GI: BS positive and Nontender and non-distended Extremity:  Edema Dorsum of left hand and wrist Skin:   Other Multiple ecchymoses left upper extremity Neuro: Alert/Oriented, Abnormal Sensory Decreased sensation left upper and left lower limb, Abnormal Motor 0/5 left upper extremity, 2- hip knee extensor synergy in the left lower extremity otherwise 0.  Right side is normal, Tone:  Hypotonia, Dysarthric and Inattention--- no changes Musc/Skel:  Other No pain with upper or lower limb range  of motion General no acute distress   Assessment/Plan: 1. 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 Physiatrist is providing close team supervision and 24 hour management of active medical problems listed below. Physiatrist and rehab team continue to assess barriers to discharge/monitor patient progress toward functional and medical goals. FIM: Function - Bathing Position: Wheelchair/chair at sink Body parts bathed by patient: Abdomen, Chest Body parts bathed by helper: Right arm, Right lower leg, Left arm, Left lower leg, Front perineal area, Buttocks, Right upper leg, Left upper leg, Back Assist Level: 2 helpers  Function- Upper Body Dressing/Undressing What is the patient wearing?: Hospital gown Assist Level: (Total A) Function - Lower Body Dressing/Undressing What is the patient wearing?: Non-skid slipper socks Position: (No pants available, however, would be total A +2 bed level) Non-skid slipper socks- Performed by helper: Don/doff right sock, Don/doff left sock Assist for footwear: Dependant  Function - Toileting Toileting activity did not occur: N/A(gown) Toileting steps completed by helper: Adjust clothing prior to toileting, Performs perineal hygiene, Adjust clothing after toileting Assist level: Two helpers  Function - Air cabin crew transfer activity did not occur: Safety/medical concerns Toilet transfer assistive device: Bedside commode, Mechanical lift Mechanical lift: Maximove Assist level  to bedside commode (at bedside): Dependent (Pt equals 0%) Assist level from bedside commode (at bedside): Dependent (Pt equals 0%)  Function - Chair/bed transfer Chair/bed transfer method: Lateral scoot Chair/bed transfer assist level: 2 helpers Chair/bed transfer  assistive device: Sliding board, Armrests  Function - Locomotion: Wheelchair Will patient use wheelchair at discharge?: Yes Type: Manual Wheelchair activity did not occur: Safety/medical concerns Wheel 50 feet with 2 turns activity did not occur: Safety/medical concerns Wheel 150 feet activity did not occur: Safety/medical concerns Function - Locomotion: Ambulation Ambulation activity did not occur: Safety/medical concerns Walk 10 feet activity did not occur: Safety/medical concerns Walk 50 feet with 2 turns activity did not occur: Safety/medical concerns Walk 150 feet activity did not occur: Safety/medical concerns Walk 10 feet on uneven surfaces activity did not occur: Safety/medical concerns  Function - Comprehension Comprehension: Visual Comprehension assistive device: Hearing aids Comprehension assist level: Understands basic 50 - 74% of the time/ requires cueing 25 - 49% of the time  Function - Expression Expression: Verbal Expression assist level: Expresses basic 75 - 89% of the time/requires cueing 10 - 24% of the time. Needs helper to occlude trach/needs to repeat words.  Function - Social Interaction Social Interaction assist level: Interacts appropriately 75 - 89% of the time - Needs redirection for appropriate language or to initiate interaction.  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  CIR PT, OT, SLP , ? Need for 15/7 2. DVT Prophylaxis/Anticoagulation: Subcutaneous Lovenox. Monitor platelet counts in any signs of bleeding.Plan to beginELIQUIS  06/09/2017 5 mg twice a day due to size of infarction. No need to reimage. Discontinue Lovenox and aspirin onceELIQUISresumed 3. Pain Management:Lyrica 100 mg twice a day, Tylenol as needed 4. Mood:Provide emotional support 5. Neuropsych: This patientiscapable of making decisions on hisown behalf. 6. Skin/Wound Care:Routine skin checks 7. Fluids/Electrolytes/Nutrition:Routine I&O's low intake may need IVF but BMET shows stable BUN and Creat 8.Dysphagia. Dysphagia #1 nectar liquids.  -continue to follow for tolerance    Recheck labs Normal 9.Hypertension. Tenormin 50 mg daily.  Fair control at present Vitals:   06/02/17 1300 06/03/17 0500  BP: 121/61 118/66  Pulse: 60 60  Resp: 18 17  Temp: 99.9 F (37.7 C) 98.5 F (36.9 C)  SpO2: 98% 98%  Controlled 2/18 10.Atrial fibrillation with history of pacemaker. Cardiac rate controlled. Patient to follow-up with Dr.Fargencardiology services [facility]. 11.Hypothyroidism. Synthroid 12.Hyperlipidemia. Zocor 13.  Constipation: lax/soft + Dulcolax suppository today if needed. 14.  Urine strong odor reported , along with poor level of arousal check UA, C and S  LOS (Days) 4 A FACE TO FACE EVALUATION WAS PERFORMED  Charlett Blake 06/03/2017, 7:38 AM

## 2017-06-03 NOTE — Progress Notes (Signed)
Speech Language Pathology Daily Session Note  Patient Details  Name: Derek Hays MRN: [id number] Date of Birth: 1930/04/17  Today's Date: 06/03/2017 SLP Individual Time: 1005-1100 SLP Individual Time Calculation (min): 55 min  Short Term Goals: Week 1: SLP Short Term Goal 1 (Week 1): Pt will consume dysphagia 1 with nectar thick liquids with minimal overt s/s of aspiration and Mod A cues for use of compensatory swallow strategies.  SLP Short Term Goal 2 (Week 1): Pt will consume trials of thin liquids with Mod A cues for chin tuck and minimal overt s/s of aspiration.  SLP Short Term Goal 3 (Week 1): Pt will demonstrate intellectual awareness by answering yes/no questions regarding physical and swallowing deficits with Mod A cues.  SLP Short Term Goal 4 (Week 1): Pt will scan to midline to locate objects in 50% of opportunities and Max A cues.  SLP Short Term Goal 5 (Week 1): Pt will complete basic familiar tasks with Min A cues.  Skilled Therapeutic Interventions: Skilled treatment session focused on dysphagia and cognition goals. SLP facilitated session by providing Max A faded to Mod A cues to implement chin tuck with sips of thin liquids via cup and straw. Pt with overall difficulty coordinating sip, chin tuck and swallow. Of note, pt with cough x 2 throughout 8 oz consumption with history of silent aspiration. SLP further facilitated session by providing Max A cues to locate objects to left of midline and required Max A cues to answer questions about deficits. Pt required Mod A cues to complete (really maintain task to completion). Pt was returned to room, left upright in wheelchair, safety belt on and all needs within reach.      Function:  Eating Eating   Modified Consistency Diet: No(Trials of thin water with SLP) Eating Assist Level: Set up assist for;Supervision or verbal cues;Helper checks for pocketed food;More than reasonable amount of time            Cognition Comprehension Comprehension assist level: Understands basic 75 - 89% of the time/ requires cueing 10 - 24% of the time  Expression   Expression assist level: Expresses basic 75 - 89% of the time/requires cueing 10 - 24% of the time. Needs helper to occlude trach/needs to repeat words.  Social Interaction Social Interaction assist level: Interacts appropriately 75 - 89% of the time - Needs redirection for appropriate language or to initiate interaction.  Problem Solving Problem solving assist level: Solves basic 50 - 74% of the time/requires cueing 25 - 49% of the time  Memory Memory assist level: Recognizes or recalls 75 - 89% of the time/requires cueing 10 - 24% of the time    Pain    Therapy/Group: Individual Therapy  Ngan Qualls 06/03/2017, 2:47 PM

## 2017-06-03 NOTE — Progress Notes (Signed)
Physical Therapy Session Note  Patient Details  Name: Derek Hays MRN: [id number] Date of Birth: February 11, 1931  Today's Date: 06/03/2017 PT Individual Time: 5400-8676 PT Individual Time Calculation (min): 70 min   Short Term Goals: Week 1:  PT Short Term Goal 1 (Week 1): Pt will perform sit<>supine with max assist  PT Short Term Goal 2 (Week 1): Pt will maintain sitting balance x 1 min with mod assist  PT Short Term Goal 3 (Week 1): Pt will initiate WC mobility  PT Short Term Goal 4 (Week 1): Pt will transfer to [facility] with max assist of 1   Skilled Therapeutic Interventions/Progress Updates: Pt presented in TIS with nsg and dgt present completing lunch and agreeable to therapy. PTA donned socks and shoes total assist. Pt transported to rehab gym and instructed in SB transfer. Performed SB transfer to R maxA x 2 with multimocal cues for increasing head/hips relationship. HOH assist for placement of R hand and sequencing. Participated on sitting balance activities at [facility] and use of theraband around neck. Pt initially requiring modA for static balance fading to minA to min guard. Pt required reinforcement for maintaining midline once achieved. Pt participated in reaching for cups with RUE and use of obliques to return to midline. Pt required minA when reaching forward left due to decreased righting reactions to L. Pt participated in lateral leans L/R with pt requiring minA to return to neutral from L side.Trialed sit to stand x 2 with total assist x 2. Pt required cues for increasing forward lean and to push through RLE. Pt returned to [place] chair via SB transfer to R with pt able to reach for armrest and pull to facilitate transfer. Pt returned to room and performed SB transfer to R maxA x 2, pt able to perform lateral lean to R and total assist for BLE management. Pt left in bed with bed alarm on, call bell within reach and needs met.      Therapy Documentation Precautions:   Precautions Precautions: Fall Precaution Comments:  neglect Restrictions Weight Bearing Restrictions: No General:   Vital Signs: Therapy Vitals Temp: 98 F (36.7 C) Temp Source: Oral Pulse Rate: 79 Resp: 15 BP: 125/86 Patient Position (if appropriate): Lying Oxygen Therapy SpO2: 93 % O2 Device: Not Delivered  See Function Navigator for Current Functional Status.   Therapy/Group: Individual Therapy  Derek Resler  [name], PTA  06/03/2017, 4:12 PM

## 2017-06-03 NOTE — Progress Notes (Signed)
Occupational Therapy Session Note  Patient Details  Name: Derek Hays MRN: [id number] Date of Birth: Apr 13, 1931  Today's Date: 06/03/2017 OT Individual Time: 2263-3354 OT Individual Time Calculation (min): 60 min    Short Term Goals: Week 1:  OT Short Term Goal 1 (Week 1): Pt will transfer with assist +1 in order to decrease caregiver burden OT Short Term Goal 2 (Week 1): Pt will attend to L UE during bathing task with mod cuing OT Short Term Goal 3 (Week 1): Pt will sit EOB with mod A in prep for functional task  Skilled Therapeutic Interventions/Progress Updates:    Pt seen for OT session focusing on functional mobility, upright toelrance, and ADL re-training. PT asleep in supine upon arrival, difficult to arouse though would respond back appropriately.  Pants donned total A from bed level, pt rolling with min-mod A to L and max A to R for pants to be pulled up. Maximove sling also placed during this time and lift used to transfer pt to tilt-in-space w/c. Pt with increased alertness once upright. He completed grooming tasks from w/c level at sink, able to locate items on L side of sink ledge with increased time and cuing for initiation to look to L side. Pt able to don R hearing aid, assist required for L, though pt able to direct caregiver with instructions of task.  Pt taken to therapy day room to eat breakfast, total A for set-up of meal. He was able to locate utensils placed on his L side and attended to all areas of his plate with min A. Min cuing provided throughout for swallowing pre-cautions per SLP orders.  Pt taken back to room at end of session, left tilted in w/c with all needs in reach, NT made aware of pt's position.   Therapy Documentation Precautions:  Precautions Precautions: Fall Precaution Comments:  neglect Restrictions Weight Bearing Restrictions: No Pain:   No/denies pain  See Function Navigator for Current Functional Status.   Therapy/Group: Individual  Therapy  Kathrene Sinopoli L 06/03/2017, 7:10 AM

## 2017-06-04 ENCOUNTER — Inpatient Hospital Stay (HOSPITAL_COMMUNITY): Payer: TRICARE For Life (TFL) | Admitting: Occupational Therapy

## 2017-06-04 ENCOUNTER — Inpatient Hospital Stay (HOSPITAL_COMMUNITY): Payer: TRICARE For Life (TFL)

## 2017-06-04 ENCOUNTER — Inpatient Hospital Stay (HOSPITAL_COMMUNITY): Payer: TRICARE For Life (TFL) | Admitting: *Deleted

## 2017-06-04 NOTE — Plan of Care (Signed)
  RH BLADDER ELIMINATION RH STG MANAGE BLADDER WITH MEDICATION WITH ASSISTANCE Description STG Manage Bladder With Medication With min Assistance.  06/04/2017 1049 - Progressing by Dietrich Pates, RN RH STG MANAGE BLADDER WITH EQUIPMENT WITH ASSISTANCE Description STG Manage Bladder With Equipment With total Assistance  06/04/2017 1049 - Progressing by Dietrich Pates, RN   RH BLADDER ELIMINATION RH STG MANAGE BLADDER WITH EQUIPMENT WITH ASSISTANCE Description STG Manage Bladder With Equipment With total Assistance  06/04/2017 1049 - Progressing by Dietrich Pates, RN   RH SKIN INTEGRITY RH STG MAINTAIN SKIN INTEGRITY WITH ASSISTANCE Description STG Maintain Skin Integrity With max Assistance.  06/04/2017 1049 - Progressing by Dietrich Pates, RN   RH SAFETY RH STG ADHERE TO SAFETY PRECAUTIONS W/ASSISTANCE/DEVICE Description STG Adhere to Safety Precautions With max  Assistance/Device.  06/04/2017 1049 - Progressing by Dietrich Pates, RN

## 2017-06-04 NOTE — Progress Notes (Signed)
Subjective/Complaints: More alert., no pain c/o  ROS: pt denies nausea, vomiting, diarrhea, cough, shortness of breath or chest pain   Objective: Vital Signs: Blood pressure 127/64, pulse 88, temperature 97.8 F (36.6 C), temperature source Oral, resp. rate 15, height 6' (1.829 m), weight 100.5 kg (221 lb 9 oz), SpO2 100 %. No results found. Results for orders placed or performed during the hospital encounter of 05/30/17 (from the past 72 hour(s))  Basic metabolic panel     Status: Abnormal   Collection Time: 06/03/17  4:33 AM  Result Value Ref Range   Sodium 138 135 - 145 mmol/L   Potassium 4.3 3.5 - 5.1 mmol/L   Chloride 105 101 - 111 mmol/L   CO2 24 22 - 32 mmol/L   Glucose, Bld 107 (H) 65 - 99 mg/dL   BUN 22 (H) 6 - 20 mg/dL   Creatinine, Ser 1.31 (H) 0.61 - 1.24 mg/dL   Calcium 8.3 (L) 8.9 - 10.3 mg/dL   GFR calc non Af Amer 48 (L) >60 mL/min   GFR calc Af Amer 56 (L) >60 mL/min    Comment: (NOTE) The eGFR has been calculated using the CKD EPI equation. This calculation has not been validated in all clinical situations. eGFR's persistently <60 mL/min signify possible Chronic Kidney Disease.    Anion gap 9 5 - 15    Comment: Performed at [facility] [address] [place], [place] [address]  Urinalysis, Routine w reflex microscopic     Status: None   Collection Time: 06/03/17  5:08 PM  Result Value Ref Range   Color, Urine YELLOW YELLOW   APPearance CLEAR CLEAR   Specific Gravity, Urine 1.020 1.005 - 1.030   pH 6.0 5.0 - 8.0   Glucose, UA NEGATIVE NEGATIVE mg/dL   Hgb urine dipstick NEGATIVE NEGATIVE   Bilirubin Urine NEGATIVE NEGATIVE   Ketones, ur NEGATIVE NEGATIVE mg/dL   Protein, ur NEGATIVE NEGATIVE mg/dL   Nitrite NEGATIVE NEGATIVE   Leukocytes, UA NEGATIVE NEGATIVE    Comment: Performed at [facility] [address], [place], [place] [address]     HEENT: Exhibits pocketing during feeding Cardio: RRR without murmur. No JVD  Resp: CTA  Bilaterally without wheezes or rales. Normal effort   GI: BS positive and Nontender and non-distended Extremity:  Edema Dorsum of left hand and wrist Skin:   Other Multiple ecchymoses left upper extremity Neuro: Alert/Oriented, Abnormal Sensory Decreased sensation left upper and left lower limb, Abnormal Motor 0/5 left upper extremity, 2- hip knee extensor synergy in the left lower extremity otherwise 0.  Right side is normal, Tone:  Hypotonia, Dysarthric and Inattention--- no changes Musc/Skel:  Other No pain with upper or lower limb range of motion General no acute distress   Assessment/Plan: 1. 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 Physiatrist is providing close team supervision and 24 hour management of active medical problems listed below. Physiatrist and rehab team continue to assess barriers to discharge/monitor patient progress toward functional and medical goals. FIM: Function - Bathing Position: Wheelchair/chair at sink Body parts bathed by patient: Abdomen, Chest Body parts bathed by helper: Right arm, Right lower leg, Left arm, Left lower leg, Front perineal area, Buttocks, Right upper leg, Left upper leg, Back Assist Level: 2 helpers  Function- Upper Body Dressing/Undressing What is  the patient wearing?: Hospital gown Assist Level: (Total A) Function - Lower Body Dressing/Undressing What is the patient wearing?: Non-skid slipper socks, Pants Position: Bed Pants- Performed by helper: Thread/unthread right pants leg, Thread/unthread left pants leg, Pull pants up/down Non-skid slipper socks- Performed by helper: Don/doff right sock, Don/doff left sock Assist for footwear: Dependant Assist for lower body dressing: 2 Helpers  Function - Toileting Toileting activity did not  occur: N/A(gown) Toileting steps completed by helper: Adjust clothing prior to toileting, Performs perineal hygiene, Adjust clothing after toileting Assist level: Two helpers  Function - Air cabin crew transfer activity did not occur: Safety/medical concerns Toilet transfer assistive device: Bedside commode, Mechanical lift Mechanical lift: Maximove Assist level to bedside commode (at bedside): Dependent (Pt equals 0%) Assist level from bedside commode (at bedside): Dependent (Pt equals 0%)  Function - Chair/bed transfer Chair/bed transfer method: Lateral scoot Chair/bed transfer assist level: 2 helpers Chair/bed transfer assistive device: Sliding board, Armrests  Function - Locomotion: Wheelchair Will patient use wheelchair at discharge?: Yes Type: Manual Wheelchair activity did not occur: Safety/medical concerns Wheel 50 feet with 2 turns activity did not occur: Safety/medical concerns Wheel 150 feet activity did not occur: Safety/medical concerns Function - Locomotion: Ambulation Ambulation activity did not occur: Safety/medical concerns Walk 10 feet activity did not occur: Safety/medical concerns Walk 50 feet with 2 turns activity did not occur: Safety/medical concerns Walk 150 feet activity did not occur: Safety/medical concerns Walk 10 feet on uneven surfaces activity did not occur: Safety/medical concerns  Function - Comprehension Comprehension: Auditory Comprehension assistive device: Hearing aids Comprehension assist level: Understands basic 75 - 89% of the time/ requires cueing 10 - 24% of the time  Function - Expression Expression: Verbal Expression assist level: Expresses basic 75 - 89% of the time/requires cueing 10 - 24% of the time. Needs helper to occlude trach/needs to repeat words.  Function - Social Interaction Social Interaction assist level: Interacts appropriately 75 - 89% of the time - Needs redirection for appropriate language or to initiate  interaction.  Function - Problem Solving Problem solving assist level: Solves basic 50 - 74% of the time/requires cueing 25 - 49% of the time  Function - Memory Memory assist level: Recognizes or recalls 75 - 89% of the time/requires cueing 10 - 24% of the time Patient normally able to recall (first 3 days only): That he or she is in a hospital, Current season  Medical Problem List and Plan: 1.Left hemiparesis, left neglect, left hemisensory deficitssecondary to right MCA infarction/distal M1 segment occlusion status post revascularizationas well as history of traumatic SDH September 2019 with follow-up CT the head 05/10/2017 showed SDH completely reabsorbed.  CIR PT, OT, SLP , team conf in am 2. DVT Prophylaxis/Anticoagulation: Subcutaneous Lovenox. Monitor platelet counts in any signs of bleeding.Plan to beginELIQUIS  06/09/2017 5 mg twice a day due to size of infarction. No need to reimage. Discontinue Lovenox and aspirin onceELIQUISresumed 3. Pain Management:Lyrica 100 mg twice a day, Tylenol as needed, no neuropathic pain c/os 4. Mood:Provide emotional support 5. Neuropsych: This patientiscapable of making decisions on hisown behalf. 6. Skin/Wound Care:Routine skin checks 7. Fluids/Electrolytes/Nutrition:Routine I&O's low intake may need IVF but BMET shows stable BUN and Creat 8.Dysphagia. Dysphagia #1 nectar liquids.  -continue to follow for tolerance    Recheck labs Normal 9.Hypertension. Tenormin 50 mg daily.  Fair control at present Vitals:   06/03/17 1500 06/04/17 0010  BP: 125/86 127/64  Pulse: 79 88  Resp: 15 15  Temp: 98  F (36.7 C) 97.8 F (36.6 C)  SpO2: 93% 100%  Controlled 2/19 10.Atrial fibrillation with history of pacemaker. Cardiac rate controlled. Patient to follow-up with Dr[facility]. 11.Hypothyroidism. Synthroid 12.Hyperlipidemia. Zocor 13.  Constipation: lax/soft + Dulcolax suppository today if needed. 14.  Urine  strong odor reported , along with poor level of arousal, Neg UA, C and S pnd  LOS (Days) 5 A FACE TO FACE EVALUATION WAS PERFORMED  Charlett Blake 06/04/2017, 7:43 AM

## 2017-06-04 NOTE — Progress Notes (Signed)
Physical Therapy Session Note  Patient Details  Name: Derek Hays MRN: [id number] Date of Birth: [birth year]  Today's Date: 06/04/2017 PT Individual Time: 1100-1200 PT Individual Time Calculation (min): 60 min   Short Term Goals: Week 1:  PT Short Term Goal 1 (Week 1): Pt will perform sit<>supine with max assist  PT Short Term Goal 2 (Week 1): Pt will maintain sitting balance x 1 min with mod assist  PT Short Term Goal 3 (Week 1): Pt will initiate WC mobility  PT Short Term Goal 4 (Week 1): Pt will transfer to [facility] with max assist of 1   Skilled Therapeutic Interventions/Progress Updates: Pt presented in bed noting urge for BM. Nsg arrived concurrently for pressure check. Pt performed rolling to L maxA x2 with HOH asssit for RUE placement to bed rail. PTA provided assist for hip positioning. Pt already with hoyer pad in place. Pt transferred via [facility] to [facility] to complete toileting (+BM) with PTA providing multimodal cues for maintaining midline. Pt returned to bed via Harrel Lemon and pt performed rolling L/R maxL, modA R to don brief. Total assist for threading pants and pt able to complete rolling L/R in same manner as prior. Pt performed supine to sit maxA with HOB elevated and verbal cues for use of bed rail to assist with truncal support. Pt performed SB transfer to L maxA x2 to [place] chair. Pt positioned in chair for comfort and half lap tray placed. Pt left in chair with QRB place, call bell within reach and needs met.      Therapy Documentation Precautions:  Precautions Precautions: Fall Precaution Comments:  neglect Restrictions Weight Bearing Restrictions: No General:   Vital Signs: Therapy Vitals Temp: 97.7 F (36.5 C) Temp Source: Oral Pulse Rate: (!) 105 Resp: 18 BP: 124/68 Patient Position (if appropriate): Sitting Pain: Pain Assessment Pain Assessment: No/denies pain  See Function Navigator for Current Functional Status.   Therapy/Group: Individual Therapy  Kodi Steil  Avani Sensabaugh, PTA  06/04/2017, 3:43 PM

## 2017-06-04 NOTE — Progress Notes (Signed)
Occupational Therapy Session Note  Patient Details  Name: Derek Hays MRN: [id number] Date of Birth: 14-Apr-1931  Today's Date: 06/04/2017 OT Individual Time: 2585-2778 OT Individual Time Calculation (min): 60 min    Short Term Goals: Week 1:  OT Short Term Goal 1 (Week 1): Pt will transfer with assist +1 in order to decrease caregiver burden OT Short Term Goal 2 (Week 1): Pt will attend to L UE during bathing task with mod cuing OT Short Term Goal 3 (Week 1): Pt will sit EOB with mod A in prep for functional task  Skilled Therapeutic Interventions/Progress Updates:    Pt seen for OT session focusing on functional transfers and ADL re-training. Pt asleep in supine upon arrival, easily awoken and agreeable to tx session.  He completed bed mobility at overall mod A with multimodal cuing for sequencing, technique and attention to L. Pt found to have been incontinent of stool. Total A +2 for hygiene and pants to be donned. He transferred to sitting EOB with max A +1. Pt sat EOB while shoes donned. He demonstrated in-secuirty in sitting with R UE supported on bed rail despite it assisting him to remain at midline. Pt with posterior bias despite max cuing for "nose over knees" PT able to sit intermittently with guarding assist for several seconds, however, max A overall for sitting balance EOB.  He completed x2sliding board transfers during session, max A +2 with max multimodal cuing for head/hip relationship and hand placement.  While seated in tilt-in-space w/c, kinesiotape applied to pt's L shoulder for subluxation and increased proprioceptive input. No sublux noted at this time.  Pt donned shirt with max A, hand over hand assist for using L UE in functional manner, pt demonstrating increased attention to L UE during dressing task today. At end of session pt stated he felt he had BM. Returned to bed, max A +1 to return to supine. Pt did not void, however, felt like he could cont to have BM.  Therefore, placed maximove sling under pt  In prep for transfer to [facility] using lift. Therapist unable to assist due to time constraints, RN made aware of pt's position and need for assist ot transfer to [facility]. Pt left in supine with bed alarm on and all needs in reach.   Therapy Documentation Precautions:  Precautions Precautions: Fall Precaution Comments:  neglect Restrictions Weight Bearing Restrictions: No Pain:   No/denies pain   See Function Navigator for Current Functional Status.   Therapy/Group: Individual Therapy  Deven Audi L 06/04/2017, 7:03 AM

## 2017-06-04 NOTE — Progress Notes (Signed)
Speech Language Pathology Daily Session Note  Patient Details  Name: Derek Hays MRN: [id number] Date of Birth: 1930-09-02  Today's Date: 06/04/2017 SLP Individual Time: 3295-1884 / 1660-6301 SLP Individual Time Calculation (min): 28 min and 25 min  Short Term Goals: Week 1: SLP Short Term Goal 1 (Week 1): Pt will consume dysphagia 1 with nectar thick liquids with minimal overt s/s of aspiration and Mod A cues for use of compensatory swallow strategies.  SLP Short Term Goal 2 (Week 1): Pt will consume trials of thin liquids with Mod A cues for chin tuck and minimal overt s/s of aspiration.  SLP Short Term Goal 3 (Week 1): Pt will demonstrate intellectual awareness by answering yes/no questions regarding physical and swallowing deficits with Mod A cues.  SLP Short Term Goal 4 (Week 1): Pt will scan to midline to locate objects in 50% of opportunities and Max A cues.  SLP Short Term Goal 5 (Week 1): Pt will complete basic familiar tasks with Min A cues.  Skilled Therapeutic Interventions: #1 Skilled ST services focused on cognitive skills. Pt was asleep in tilting chair upon entering room, pt required max A verbal and tactile cues to gain arousal/attention. SLP put in hearing airs to aid in arousal and following directions. Pt demonstrated verbal response to basic questions pertaining to immediate environment with max a verbal and visual cues. SLP attempted color sorting task, however due to pt's impairment in attention was unable to participate in task. SLP informed pt she would return at a later time. Pt was left in room with call bell within reach. Reccomend to continue skilled ST services.   #2 Skilled ST services focused on swallow skills. SLP facilitated oral care with suction toothbrush prior to trials of thin liquid via cup sips.Pt was unable to recall swallow strategies with thin and required max-mod a verbal cues during trials to utilize swallow stratgey.Pt demonstrated clear vocal  quality and 1 cough during trials. SLP reviewed with pt and wife about the possibly of water protocol and follow up MBS, however depend on pt's ability to consistently follow direction and maintain alertness.Pt and wife stated understanding. Pt was left in room with call bell within reach. Reccomend to continue skilled ST services.   Function:  Eating Eating   Modified Consistency Diet: No(thin trials ) Eating Assist Level: Set up assist for;Supervision or verbal cues;More than reasonable amount of time           Cognition Comprehension Comprehension assist level: Understands basic 75 - 89% of the time/ requires cueing 10 - 24% of the time  Expression   Expression assist level: Expresses basic 75 - 89% of the time/requires cueing 10 - 24% of the time. Needs helper to occlude trach/needs to repeat words.  Social Interaction Social Interaction assist level: Interacts appropriately 75 - 89% of the time - Needs redirection for appropriate language or to initiate interaction.  Problem Solving Problem solving assist level: Solves basic 50 - 74% of the time/requires cueing 25 - 49% of the time  Memory Memory assist level: Recognizes or recalls 75 - 89% of the time/requires cueing 10 - 24% of the time    Pain Pain Assessment Pain Assessment: No/denies pain  Therapy/Group: Individual Therapy  Verble Styron  [facility] 06/04/2017, 2:59 PM

## 2017-06-04 NOTE — Progress Notes (Signed)
Occupational Therapy Session Note  Patient Details  Name: Derek Hays MRN: [id number] Date of Birth: August 05, 1930  Today's Date: 06/04/2017 OT Individual Time: 1510-1540 OT Individual Time Calculation (min): 30 min    Short Term Goals: Week 1:  OT Short Term Goal 1 (Week 1): Pt will transfer with assist +1 in order to decrease caregiver burden OT Short Term Goal 2 (Week 1): Pt will attend to L UE during bathing task with mod cuing OT Short Term Goal 3 (Week 1): Pt will sit EOB with mod A in prep for functional task  Skilled Therapeutic Interventions/Progress Updates:    Pt completed transfer via sliding board from wheelchair to the bed with mod assist after placement of the board, and with transferring to the right side.  Once on the side of the bed worked on sitting balance with LUE in weightbearing beside of him.  Min assist needed for static balance with max assist for dynamic sitting balance.  When asked if he was sitting up straight he would reply "no" and could accurately point to the left side as to the side he was leaning, but needed assist to initiate and complete midline balance.  Transitioned to supine with total assist with pt's LUE positioned on pillows for support.  Noted pt could demonstrate some slight knee and hip flexion to command from supine position as well.  Finished session with call button and phone in reach and bed alarm in place.  Pt's spouse present for session as well.  Discussed briefly expectations of needing a longer therapy time greater than 3-4 weeks to get pt to a level she can assist with.  She voiced agreement as well.  Therapy Documentation Precautions:  Precautions Precautions: Fall Precaution Comments:  neglect, severe L hemiparesis Restrictions Weight Bearing Restrictions: No  Pain: Pain Assessment Pain Assessment: Faces Pain Score: Asleep Faces Pain Scale: Hurts a little bit Pain Type: Acute pain Pain Location: Shoulder Pain Orientation:  Left Pain Descriptors / Indicators: Discomfort Pain Intervention(s): Repositioned ADL: See Function Navigator for Current Functional Status.   Therapy/Group: Individual Therapy  Eliya Geiman OTR/L 06/04/2017, 4:36 PM

## 2017-06-05 ENCOUNTER — Inpatient Hospital Stay (HOSPITAL_COMMUNITY): Payer: TRICARE For Life (TFL) | Admitting: Occupational Therapy

## 2017-06-05 ENCOUNTER — Inpatient Hospital Stay (HOSPITAL_COMMUNITY): Payer: TRICARE For Life (TFL)

## 2017-06-05 ENCOUNTER — Inpatient Hospital Stay (HOSPITAL_COMMUNITY): Payer: Medicare Other | Admitting: Speech Pathology

## 2017-06-05 ENCOUNTER — Inpatient Hospital Stay (HOSPITAL_COMMUNITY): Payer: TRICARE For Life (TFL) | Admitting: Physical Therapy

## 2017-06-05 ENCOUNTER — Inpatient Hospital Stay (HOSPITAL_COMMUNITY): Payer: TRICARE For Life (TFL) | Admitting: Psychology

## 2017-06-05 LAB — URINE CULTURE: CULTURE: NO GROWTH

## 2017-06-05 MED ORDER — BETHANECHOL CHLORIDE 10 MG PO TABS
5.0000 mg | ORAL_TABLET | Freq: Three times a day (TID) | ORAL | Status: DC
  Administered 2017-06-05 – 2017-06-16 (×33): 5 mg via ORAL
  Filled 2017-06-05 (×33): qty 1

## 2017-06-05 NOTE — Patient Care Conference (Signed)
Inpatient RehabilitationTeam Conference and Plan of Care Update Date: 06/05/2017   Time: 10:45 AM    Patient Name: Derek Hays      Medical Record Number: [id number]  Date of Birth: 1930/07/20 Sex: Male         Room/Bed: 4W07C/4W07C-01 Payor Info: Payor: MEDICARE / Plan: MEDICARE PART A AND B / Product Type: *No Product type* /    Admitting Diagnosis: Rt CVA  Admit Date/Time:  05/30/2017  3:17 PM Admission Comments: No comment available   Primary Diagnosis:  <principal problem not specified> Principal Problem: <principal problem not specified>  Patient Active Problem List   Diagnosis Date Noted  . 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 Stroke (cerebrum) ([place]) 05/27/2017    Expected Discharge Date: Expected Discharge Date: 06/14/17  Team Members Present: Physician leading conference: Dr. Alysia Penna Social Worker Present: Ovidio Kin, LCSW Nurse Present: Other (comment)(Denise Lloyd-RN) PT Present: Barrie Folk, PT;Rosita Dechalus, PTA OT Present: Napoleon Form, OT SLP Present: Stormy Fabian, SLP PPS Coordinator present : Daiva Nakayama, RN, CRRN     Current Status/Progress Goal Weekly Team Focus  Medical   Patient with severe left hemiparesis and left neglect.  Has awareness of deficits  Upgrade mobility to 1 person assist, maintain medical stability  Work on bladder emptying   Bowel/Bladder     Continence   Incontinent B & B-urinary rentension   Timed tolieting-MD bladder meds  Swallow/Nutrition/ Hydration   dysphagia 1 with nectar thick liquids  Min A to supervision with least restrictive diet  consumption of dysphagia 1 with nectar trials of water with SLP only and Max A cues to use chin tuck   ADL's   Mod-max A sitting balance; +2 sliding board transfers; total A +2 LB dressing; max A UB bathing/dressing  Min A overall will likely downgrade due to slow pt progress  Static sitting banace,  functional transfers, ADL re-training, neuro re-ed   Mobility   MaxA bed mobility, max/maxA sitting balance, maxA x 2 SB transfers, total assist standing  min-modA  sitting balance, transfers, L NMR, d/c planning   Communication             Safety/Cognition/ Behavioral Observations  Mod A to Max A for basic problem solving, possibly d/t left inattention and no overall awareness  Min A with basic  basic problem solving, overall attention and continuation of tasks, intellectual awareness   Pain             Skin        monitor skin currently no issues        *See Care Plan and progress notes for long and short-term goals.     Barriers to Discharge  Current Status/Progress Possible Resolutions Date Resolved   Physician    Neurogenic Bowel & Bladder;Decreased caregiver support;Medical stability  Requiring intermittent catheterization, wife cannot provide physical assistance  Slow progress towards goals  Likely will need SNF      Nursing                  PT                    OT                  SLP Lack of/limited family support              SW  Discharge Planning/Teaching Needs:  Wife has health issues of her own and can not provide physical assist, will probably need NHP      Team Discussion:  Goals mod/max level of assist. Pt has had a severe CVA. I & O cathing required due to retention. MBS on Friday currently on Dys 1 nectar thick liquid diet. Pain in shoulder MD aware of. Currently total assist plus 2. Do not feel wife will be able to provide the care pt will require due to her own health issues.  Revisions to Treatment Plan:  NHP    Continued Need for Acute Rehabilitation Level of Care: The patient requires daily medical management by a physician with specialized training in physical medicine and rehabilitation for the following conditions: Daily direction of a multidisciplinary physical rehabilitation program to ensure safe treatment while eliciting the  highest outcome that is of practical value to the patient.: Yes Daily medical management of patient stability for increased activity during participation in an intensive rehabilitation regime.: Yes Daily analysis of laboratory values and/or radiology reports with any subsequent need for medication adjustment of medical intervention for : Neurological problems;Urological problems  [name] 06/07/2017, 8:56 AM

## 2017-06-05 NOTE — Progress Notes (Signed)
Occupational Therapy Session Note  Patient Details  Name: Derek Hays MRN: [id number] Date of Birth: 07/23/1930  Today's Date: 06/05/2017 OT Individual Time: 3662-9476 OT Individual Time Calculation (min): 60 min    Short Term Goals: Week 1:  OT Short Term Goal 1 (Week 1): Pt will transfer with assist +1 in order to decrease caregiver burden OT Short Term Goal 2 (Week 1): Pt will attend to L UE during bathing task with mod cuing OT Short Term Goal 3 (Week 1): Pt will sit EOB with mod A in prep for functional task  Skilled Therapeutic Interventions/Progress Updates:    Pt seen for OT session focusing on functional mobility and upright tolerance. Pt in supine upon arrival, awake and agreeable to tx session. He voiced need to have BM. Rolled with mod-max A to R and min A to L with VCs for technique in order for maximove sling to be placed. Used lift to transfer pt to [facility]. Pt sat on BSC with support of sling for sitting balance. Pt with small void, however, cont to go small amounts when he stated he was finished during transfer back to bed and when rolling for new brief and pants to be donned. Shoes donned total A. Pt required max- cuing and total A for awareness of L UE throughout transfers and session. Pt left in supine at end of session, L UE supported on pillow and all needs in reach.   Therapy Documentation Precautions:  Precautions Precautions: Fall Precaution Comments:  neglect, severe L hemiparesis Restrictions Weight Bearing Restrictions: No Pain:   No/denies pain  See Function Navigator for Current Functional Status.   Therapy/Group: Individual Therapy  Zubin Pontillo L 06/05/2017, 7:11 AM

## 2017-06-05 NOTE — Progress Notes (Signed)
Physical Therapy Session Note  Patient Details  Name: Derek Hays MRN: [id number] Date of Birth: 04/08/1931  Today's Date: 06/09/2017 PT Individual Time:  1000-1030 PT Total Individual Minutes: 79min       Skilled Therapeutic Interventions/Progress Updates: Pt presented in bed completing breakfast and agreeable to therapy. Pt performed supine to sit with maxA x 1 and cues for sequencing. Pt performed SB transfer with L with modA x 2 and cues for increasing R lean to facilitate head hips relationship. Pt transferred to day room and performed standing tolerance in standing frame. Pt required max multimodal cues for correcting to midline with pt able to hold brief periods. Pt returned to [place] chair and returned to room with half lap tray placed, QRB on, and call bell within reach.      Therapy Documentation Precautions:  Precautions Precautions: Fall Precaution Comments:  neglect, severe L hemiparesis Restrictions Weight Bearing Restrictions: No General:   Vital Signs:  Pain: Pain Assessment Pain Score: 4  Pain Location: Toe (Comment which one) Pain Intervention(s): Medication (See eMAR) Mobility:   Locomotion :    Trunk/Postural Assessment :    Balance:   Exercises:   Other Treatments:     See Function Navigator for Current Functional Status.   Therapy/Group: Individual Therapy  Dixie Coppa 06/09/2017, 10:16 PM

## 2017-06-05 NOTE — Progress Notes (Signed)
Speech Language Pathology Daily Session Note  Patient Details  Name: Derek Hays MRN: [id number] Date of Birth: 24-Jul-1930  Today's Date: 06/05/2017 SLP Individual Time: 1330-1415 SLP Individual Time Calculation (min): 45 min  Short Term Goals: Week 1: SLP Short Term Goal 1 (Week 1): Pt will consume dysphagia 1 with nectar thick liquids with minimal overt s/s of aspiration and Mod A cues for use of compensatory swallow strategies.  SLP Short Term Goal 2 (Week 1): Pt will consume trials of thin liquids with Mod A cues for chin tuck and minimal overt s/s of aspiration.  SLP Short Term Goal 3 (Week 1): Pt will demonstrate intellectual awareness by answering yes/no questions regarding physical and swallowing deficits with Mod A cues.  SLP Short Term Goal 4 (Week 1): Pt will scan to midline to locate objects in 50% of opportunities and Max A cues.  SLP Short Term Goal 5 (Week 1): Pt will complete basic familiar tasks with Min A cues.  Skilled Therapeutic Interventions: Skilled treatment session focused on cognitive goals. SLP facilitated session by providing Mod A verbal cues for intellectual awareness of cognitive deficits and Min A verbal cues for intellectual awareness of physical deficits. Patient participated in a calendar making task with extra time and Min A verbal cues for problem solving and Mod A verbal cues to scan past midline into the right field of environment. Trials of thin liquids were not attempted this session due to patient receiving medications crushed in applesauce and nectar-thick liquids upon arrival. Patient left upright in wheelchair with all needs within reach and wife present. Continue with current plan of care.      Function:  Cognition Comprehension Comprehension assist level: Understands basic 75 - 89% of the time/ requires cueing 10 - 24% of the time  Expression   Expression assist level: Expresses basic 75 - 89% of the time/requires cueing 10 - 24% of the time.  Needs helper to occlude trach/needs to repeat words.  Social Interaction Social Interaction assist level: Interacts appropriately 90% of the time - Needs monitoring or encouragement for participation or interaction.  Problem Solving Problem solving assist level: Solves basic 75 - 89% of the time/requires cueing 10 - 24% of the time  Memory Memory assist level: Recognizes or recalls 90% of the time/requires cueing < 10% of the time    Pain Discomfort in chair, patient repositioned   Therapy/Group: Individual Therapy  Derek Hays 06/05/2017, 3:29 PM

## 2017-06-05 NOTE — Progress Notes (Signed)
Subjective/Complaints: Patient is asleep but awakens to voice and physical stimulation.  ROS: pt denies nausea, vomiting, diarrhea, cough, shortness of breath or chest pain   Objective: Vital Signs: Blood pressure 108/77, pulse 60, temperature 97.6 F (36.4 C), temperature source Oral, resp. rate 18, height 6' (1.829 m), weight 100.5 kg (221 lb 9 oz), SpO2 98 %. No results found. Results for orders placed or performed during the hospital encounter of 05/30/17 (from the past 72 hour(s))  Basic metabolic panel     Status: Abnormal   Collection Time: 06/03/17  4:33 AM  Result Value Ref Range   Sodium 138 135 - 145 mmol/L   Potassium 4.3 3.5 - 5.1 mmol/L   Chloride 105 101 - 111 mmol/L   CO2 24 22 - 32 mmol/L   Glucose, Bld 107 (H) 65 - 99 mg/dL   BUN 22 (H) 6 - 20 mg/dL   Creatinine, Ser 1.31 (H) 0.61 - 1.24 mg/dL   Calcium 8.3 (L) 8.9 - 10.3 mg/dL   GFR calc non Af Amer 48 (L) >60 mL/min   GFR calc Af Amer 56 (L) >60 mL/min    Comment: (NOTE) The eGFR has been calculated using the CKD EPI equation. This calculation has not been validated in all clinical situations. eGFR's persistently <60 mL/min signify possible Chronic Kidney Disease.    Anion gap 9 5 - 15    Comment: Performed at [facility] [address], [place], [place] [address]  Urinalysis, Routine w reflex microscopic     Status: None   Collection Time: 06/03/17  5:08 PM  Result Value Ref Range   Color, Urine YELLOW YELLOW   APPearance CLEAR CLEAR   Specific Gravity, Urine 1.020 1.005 - 1.030   pH 6.0 5.0 - 8.0   Glucose, UA NEGATIVE NEGATIVE mg/dL   Hgb urine dipstick NEGATIVE NEGATIVE   Bilirubin Urine NEGATIVE NEGATIVE   Ketones, ur NEGATIVE NEGATIVE mg/dL   Protein, ur NEGATIVE NEGATIVE mg/dL   Nitrite NEGATIVE NEGATIVE   Leukocytes, UA NEGATIVE NEGATIVE    Comment: Performed at [facility] [address] [place], [place] [address]  Urine Culture     Status: None   Collection Time:  06/03/17  5:10 PM  Result Value Ref Range   Specimen Description URINE, CATHETERIZED    Special Requests NONE    Culture      NO GROWTH Performed at [facility], [place] [address] [place], [place] [address]    Report Status 06/05/2017 FINAL      HEENT: Exhibits pocketing during feeding Cardio: RRR without murmur. No JVD  Resp: CTA Bilaterally without wheezes or rales. Normal effort   GI: BS positive and Nontender and non-distended Extremity:  Edema Dorsum of left hand and wrist Skin:   Other Multiple ecchymoses left upper extremity Neuro: Alert/Oriented, Abnormal Sensory Decreased sensation left upper and left lower limb, Abnormal Motor 0/5 left upper extremity, 2- hip knee extensor synergy in the left lower extremity otherwise 0.  Right side is normal, Tone:  Hypotonia, Dysarthric and Inattention--- no changes Musc/Skel:  Other No pain with upper or lower limb range of motion General no acute distress   Assessment/Plan: 1. 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 Physiatrist is providing close team supervision and 24 hour management of active medical problems  listed below. Physiatrist and rehab team continue to assess barriers to discharge/monitor patient progress toward functional and medical goals. FIM: Function - Bathing Position: Wheelchair/chair at sink Body parts bathed by patient: Abdomen, Chest Body parts bathed by helper: Right arm, Right lower leg, Left arm, Left lower leg, Front perineal area, Buttocks, Right upper leg, Left upper leg, Back Assist Level: 2 helpers  Function- Upper Body Dressing/Undressing What is the patient wearing?: Pull over shirt/dress Pull over shirt/dress - Perfomed by helper: Thread/unthread right sleeve, Thread/unthread left sleeve, Put  head through opening, Pull shirt over trunk Assist Level: (Total A) Function - Lower Body Dressing/Undressing What is the patient wearing?: Pants, Socks, Shoes Position: Bed Pants- Performed by helper: Thread/unthread right pants leg, Thread/unthread left pants leg, Pull pants up/down Non-skid slipper socks- Performed by helper: Don/doff right sock, Don/doff left sock Socks - Performed by helper: Don/doff right sock, Don/doff left sock Shoes - Performed by helper: Don/doff right shoe, Don/doff left shoe, Fasten right, Fasten left Assist for footwear: Dependant Assist for lower body dressing: 2 Helpers  Function - Toileting Toileting activity did not occur: N/A(gown) Toileting steps completed by helper: (P) Adjust clothing prior to toileting, Performs perineal hygiene, Adjust clothing after toileting Assist level: Two helpers  Function - Air cabin crew transfer activity did not occur: Safety/medical concerns Toilet transfer assistive device: Bedside commode, Mechanical lift Mechanical lift: Maximove Assist level to bedside commode (at bedside): 2 helpers Assist level from bedside commode (at bedside): 2 helpers  Function - Chair/bed transfer Chair/bed transfer method: Lateral scoot Chair/bed transfer assist level: Moderate assist (Pt 50 - 74%/lift or lower) Chair/bed transfer assistive device: Sliding board, Armrests  Function - Locomotion: Wheelchair Will patient use wheelchair at discharge?: Yes Type: Manual(tilt in space wc) Wheelchair activity did not occur: Safety/medical concerns Assist Level: Dependent (Pt equals 0%)(for transport) Wheel 50 feet with 2 turns activity did not occur: Safety/medical concerns Wheel 150 feet activity did not occur: Safety/medical concerns Function - Locomotion: Ambulation Ambulation activity did not occur: Safety/medical concerns Walk 10 feet activity did not occur: Safety/medical concerns Walk 50 feet with 2 turns activity did not  occur: Safety/medical concerns Walk 150 feet activity did not occur: Safety/medical concerns Walk 10 feet on uneven surfaces activity did not occur: Safety/medical concerns  Function - Comprehension Comprehension: Auditory Comprehension assistive device: Hearing aids Comprehension assist level: Understands basic 75 - 89% of the time/ requires cueing 10 - 24% of the time  Function - Expression Expression: Verbal Expression assist level: Expresses basic 75 - 89% of the time/requires cueing 10 - 24% of the time. Needs helper to occlude trach/needs to repeat words.  Function - Social Interaction Social Interaction assist level: Interacts appropriately 75 - 89% of the time - Needs redirection for appropriate language or to initiate interaction.  Function - Problem Solving Problem solving assist level: Solves basic 50 - 74% of the time/requires cueing 25 - 49% of the time  Function - Memory Memory assist level: Recognizes or recalls 75 - 89% of the time/requires cueing 10 - 24% of the time Patient normally able to recall (first 3 days only): That he or she is in a hospital, Current season  Medical Problem List and Plan: 1.Left hemiparesis, left neglect, left hemisensory deficitssecondary to right MCA infarction/distal M1 segment occlusion status post revascularizationas well as history of traumatic SDH September 2019 with follow-up CT the head 05/10/2017 showed SDH completely reabsorbed.  CIR PT, OT, SLP , Team conference today please see physician documentation  under team conference tab, met with team face-to-face to discuss problems,progress, and goals. Formulized individual treatment plan based on medical history, underlying problem and comorbidities. 2. DVT Prophylaxis/Anticoagulation: Subcutaneous Lovenox. Monitor platelet counts in any signs of bleeding.Plan to beginELIQUIS  06/09/2017 5 mg twice a day due to size of infarction. No need to reimage. Discontinue Lovenox and aspirin  onceELIQUISresumed 3. Pain Management:Lyrica 100 mg twice a day, Tylenol as needed, no neuropathic pain c/os 4. Mood:Provide emotional support 5. Neuropsych: This patientiscapable of making decisions on hisown behalf. 6. Skin/Wound Care:Routine skin checks 7. Fluids/Electrolytes/Nutrition:Routine I&O's low intake may need IVF but BMET shows stable BUN and Creat 8.Dysphagia. Dysphagia #1 nectar liquids.  -continue to follow for tolerance    Recheck labs Normal 9.Hypertension. Tenormin 50 mg daily.  Fair control at present Vitals:   06/05/17 0450 06/05/17 0759  BP: 118/70 108/77  Pulse: 61 60  Resp: 18 18  Temp: 97.6 F (36.4 C)   SpO2: 97% 98%  Controlled 2/20 10.Atrial fibrillation with history of pacemaker. Cardiac rate controlled. Patient to follow-up with Dr[facility]. 11.Hypothyroidism. Synthroid 12.Hyperlipidemia. Zocor 13.  Constipation: lax/soft + Dulcolax suppository today if needed. 14.  Urine strong odor reported, incont , Neg UA, C and S   LOS (Days) 6 A FACE TO FACE EVALUATION WAS PERFORMED  Luanna Salk Kirsteins 06/05/2017, 10:00 AM

## 2017-06-05 NOTE — Progress Notes (Signed)
Social Work Patient ID: Derek Hays, male   DOB: Apr 09, 1931, 84 y.o.   MRN: [id number]  Met with pt and wife to discuss team conference goals mod assist and target discharge date 3/1. He will have a MBS this Friday to see if can upgrade his diet. Wife and pt aware wife can not provide physical assist due to her own health issues. Will probably need NHP from here and see once there if can recover enough to return home with his wife. She will look at the area NH around their home and get back with this worker.

## 2017-06-05 NOTE — Consult Note (Signed)
Neuropsychological Consultation   Patient:   Derek Hays   DOB:   [birth year]  MR Number:  [id number]  Location:  [facility] [id number] [place] [address] Dept: [phone] Loc: [phone]           Date of Service:   06/05/2017  Start Time:   11 AM End Time:   12 PM  Provider/Observer:  Ilean Skill, Psy.D.       Clinical Neuropsychologist       Billing Code/Service: [phone] Units  Chief Complaint:    Derek Hays is an 84 year old male with a history of hypertension, atrial fibrillation/pacemaker.  Eliquis was on board since 2016 but was discontinued after traumatic subdural hematoma in September 2018.  By January 2019 imaging showed that the subdural hematoma had been completely reabsorbed.  The patient presented on 05/27/2017 with acute onset of left-sided weakness as well as slurred speech.   MRI/MRA showed acute right MCA infarct involving both the frontal and parietal lobe.  Small acute or subacute infarcts in the left parietal lobe.  The patient has had difficulty coping with this loss of function as he had been independent using a rolling walker/cane and still driving his car prior to this event.  Reason for Service:  The patient was referred for neuropsychological consult due to coping and adjustment issues following his recent cerebrovascular accident.  Below is the HPI for the current admission.  Derek Hays a 84 y.o.right handed malewith history of hypertension, atrial fibrillation/pacemaker2016 on Eliquisin the past but discontinuedafter traumatic SDH September 2018 with repeat head CT scan 05/10/2017 showed SDH completely reabsorbedfollowed by Dr Bridgette Habermann [facility]. Per chart review patient lives with spouse. Independent using a rolling walker/cane and still driving. One level home with one step to entry. Presented2/11/2019with acute onset of left-sided  weakness as well as slurred speech. CT of the head positive for hyperdense right MCA. No hemorrhage or mass effect. Subacute appearing left PCA territory infarct. CT cerebral perfusion scan emergent large vessel occlusion of the distal M1 segment of the right middle cerebral artery. Underwent revascularization per interventional radiology.Echocardiogram with ejection fraction 10% grade 2 diastolic dysfunction.MRI/MRA02/03/2018 acute right MCA infarct involving the right insula and frontal parietal lobe. Small acute or subacute infarct left parietal lobe.Marland KitchenMRA status post revascularization of right middle cerebral artery occlusion. There remain moderate to severe stenosis proximal right M2 segment which felt to be due to underlying atherosclerotic disease.Presently on aspirin for CVA prophylaxisand planned is to resume ELIQUISin10 days due to size of infarction.Marland KitchenDysphagia #1 nectar thick liquid diet.Physicaland occupationaltherapy evaluationscompleted 05/28/2017 with recommendations of physical medicine rehabilitation consult.Patient was admitted for a comprehensive rehabilitation program  Current Status:  The patient reports that he is having trouble coping with the sudden change in functioning.  The patient reports that there are times when he is somewhat confused about what all is happened and is worried that he will not regain his level of functioning prior to this most recent stroke.  The patient reports that he been feeling better about his recovery after his subdural hematoma but now is quite concerned about what is expected to happen in the future.  Behavioral Observation: Derek Hays  presents as a 84 y.o.-year-old Right Caucasian Male who appeared his stated age. his dress was Appropriate and he was Well Groomed and his manners were Appropriate to the situation.  his participation was indicative of Appropriate and Redirectable  behaviors.  There were any physical disabilities noted.  he  displayed an appropriate level of cooperation and motivation.     Interactions:    Active Appropriate and Redirectable  Attention:   abnormal and attention span appeared shorter than expected for age  Memory:   abnormal; remote memory intact, recent memory impaired  Visuo-spatial:  not examined  Speech (Volume):  low  Speech:   normal; slurred  Thought Process:  Coherent and Relevant  Though Content:  WNL; not suicidal and not homicidal  Orientation:   person, place and situation  Judgment:   Fair  Planning:   Fair  Affect:    Anxious  Mood:    Anxious  Insight:   Fair  Intelligence:   high   Medical History:   Past Medical History:  Diagnosis Date  . Atrial fibrillation ([place])   . Hypertension     Impression/DX:  Derek Hays is an 84 year old male with a history of hypertension, atrial fibrillation/pacemaker.  Eliquis was on board since 2016 but was discontinued after traumatic subdural hematoma in September 2018.  By January 2019 imaging showed that the subdural hematoma had been completely reabsorbed.  The patient presented on 05/27/2017 with acute onset of left-sided weakness as well as slurred speech.   MRI/MRA showed acute right MCA infarct involving both the frontal and parietal lobe.  Small acute or subacute infarcts in the left parietal lobe.  The patient has had difficulty coping with this loss of function as he had been independent using a rolling walker/cane and still driving his car prior to this event.  The patient reports that he is having trouble coping with the sudden change in functioning.  The patient reports that there are times when he is somewhat confused about what all is happened and is worried that he will not regain his level of functioning prior to this most recent stroke.  The patient reports that he been feeling better about his recovery after his subdural hematoma but now is quite concerned about what is expected to happen in the  future.   Disposition/Plan:  Today we worked on Therapist, occupational and redirecting some of his worry about what might happen as far as his recovery is a patient been focused on the negative worst outcomes.  Diagnosis:   CVA        Electronically Signed   _______________________ Ilean Skill, Psy.D.

## 2017-06-06 ENCOUNTER — Inpatient Hospital Stay (HOSPITAL_COMMUNITY): Payer: Medicare Other | Admitting: Speech Pathology

## 2017-06-06 ENCOUNTER — Inpatient Hospital Stay (HOSPITAL_COMMUNITY): Payer: TRICARE For Life (TFL) | Admitting: Physical Therapy

## 2017-06-06 ENCOUNTER — Inpatient Hospital Stay (HOSPITAL_COMMUNITY): Payer: TRICARE For Life (TFL) | Admitting: Occupational Therapy

## 2017-06-06 MED ORDER — SENNOSIDES-DOCUSATE SODIUM 8.6-50 MG PO TABS
2.0000 | ORAL_TABLET | Freq: Two times a day (BID) | ORAL | Status: DC
  Administered 2017-06-06 – 2017-06-15 (×16): 2 via ORAL
  Filled 2017-06-06 (×18): qty 2

## 2017-06-06 NOTE — Progress Notes (Signed)
Occupational Therapy Weekly Progress Note  Patient Details  Name: Derek Hays MRN: [id number] Date of Birth: 09/24/1930  Beginning of progress report period: May 31, 2017 End of progress report period: June 06, 2017  Today's Date: 06/06/2017 OT Individual Time: 1300-1400 OT Individual Time Calculation (min): 60 min    Patient has met 1 of 3 short term goals.  Pt is making very slow progress towards OT goals. He continues to be most limited by dense Hays hemiplegia and inattention. He has improved sitting balance and bed mobility skills during reporting period, however, cont to require +2 assist for transfers and max- total A for ADLs. Pt will cont to benefit from skilled services in order to continue to decrease pt's burden of care, however, team suspects pt will require SNF placement following rehab admission as pt's wife is elderly and only able to provide supervision assist at d/c.   Patient continues to demonstrate the following deficits: abnormal posture, ataxia, cognitive deficits, disturbance of vision, flaccid hemiplegia and hemiparesis, hemiplegia affecting non-dominant side and muscle weakness (generalized) and therefore will continue to benefit from skilled OT intervention to enhance overall performance with BADL and Reduce care partner burden.  Patient not progressing toward long term goals.  See goal revision..  Plan of care revisions: Goals downgraded to mod-max A overall. See POC for goal details.  OT Short Term Goals Week 1:  OT Short Term Goal 1 (Week 1): Pt will transfer with assist +1 in order to decrease caregiver burden OT Short Term Goal 1 - Progress (Week 1): Not met OT Short Term Goal 2 (Week 1): Pt will attend to Hays UE during bathing task with mod cuing OT Short Term Goal 2 - Progress (Week 1): Met OT Short Term Goal 3 (Week 1): Pt will sit EOB with mod A in prep for functional task OT Short Term Goal 3 - Progress (Week 1): Met Week 2:  OT Short Term Goal 1  (Week 2): Pt will transfer with max A +1 in order to reduce caregiver burden. OT Short Term Goal 2 (Week 2): Pt will maintain static sitting balance on BSC without suport of mechanical lift with min A OT Short Term Goal 3 (Week 2): Pt will roll to R with mod In during bed level ADL  Skilled Therapeutic Interventions/Progress Updates:    Pt seen for OT session focusing on functional sitting balance and ADL re-training. Pt in supine upon arrival, voicing being finished using bed pan. Pt - BM on bedpan though stool visible. RN made aware for disimpaction, stool soft and RN requesting pt get to [facility] in order for upright void.  Pt transferred to sitting EOB with max A+1. He ate lunch seated EOB, pt able to maintain static sitting balance at midline for short periods (~10 second increments) before Hays or posterior LOB. Required max multimodal cuing for awareness, pt initially able to self correct however, when fatigued required assist. VCs required to adhere to swallowing pre-cautions per SLP orders. Used Hays UE to stabilize ice cream container for increased awareness and attention to Hays side.  Pt dressed UB seated EOB, able to recall hemi dressing techniques, however, required total A for dressing.  Maximove used to transfer pt from [facility] for upright toileting. PT left supported in Maximove sling over [facility] with RN and wife present, call bell in reach.  Therapy Documentation Precautions:  Precautions Precautions: Fall Precaution Comments:  neglect, severe Hays hemiparesis Restrictions Weight Bearing Restrictions: No Pain:   No/  denies pain  See Function Navigator for Current Functional Status.   Therapy/Group: Individual Therapy  Derek Hays 06/06/2017, 6:45 AM

## 2017-06-06 NOTE — Progress Notes (Signed)
Physical Therapy Session Note  Patient Details  Name: Derek Hays MRN: [id number] Date of Birth: [birth year]  Today's Date: 06/06/2017 PT Individual Time: 0800-0830 AND [phone] PT Individual Time Calculation (min): 30 min AND 60 min    Short Term Goals: Week 1:  PT Short Term Goal 1 (Week 1): Pt will perform sit<>supine with max assist  PT Short Term Goal 2 (Week 1): Pt will maintain sitting balance x 1 min with mod assist  PT Short Term Goal 3 (Week 1): Pt will initiate WC mobility  PT Short Term Goal 4 (Week 1): Pt will transfer to [facility] with max assist of 1   Skilled Therapeutic Interventions/Progress Updates:   Session 1.   Pt received supine in bed and agreeable to PT. PT assisted pt to don pants at bed level with bridge technique with mod assist to stabilize the LLE. Supine>sit transfer with max assist  Sitting balance EOB x 5 min with min-mod assist from PT. SB transfer to [facility] with max-total assist of 1 to the R with max cues for proper sequencing. .   Patient left room, sitting in WC, with call bell in reach and all needs met.     Session 2.   Pt received sitting in WC and agreeable to PT  Pt transported to rehab gym in [facility]. PT instructed pt in sit<>stand transfers in parallel bars x 5. Max assist of 1. Standing balance/tolerance in parallel bars 5 x 1 minute with mox assist progressing to min assist at best. Max cues for trunk and cervical extension, as well as orientaiton to midline.   SB transfer to Nustep. Reciprocal movement training level 1>3, x 5 minutes, BLE only with L thigh  Support, and  Mod assist from PT to maintain neurtral postural alignment.   PT instructed pt in gait training at rail in hall x 3 ft with Max assist +2. Pt distracted by shoulder pain and unable to sequence step to gait pattern.  Patient returned to room and left sitting in [facility] with call bell in reach and all needs met.           Therapy Documentation Precautions:   Precautions Precautions: Fall Precaution Comments:  neglect, severe L hemiparesis Restrictions Weight Bearing Restrictions: No Pain: 0/10 at rest. 5/10 with passive L shoulder flexion.   See Function Navigator for Current Functional Status.   Therapy/Group: Individual Therapy  [name] 06/06/2017, 10:10 AM

## 2017-06-06 NOTE — Progress Notes (Signed)
Encouraged team assist pt with fluid intake.

## 2017-06-06 NOTE — Progress Notes (Signed)
Speech Language Pathology Daily Session Note  Patient Details  Name: Derek Hays MRN: [id number] Date of Birth: January 03, 1931  Today's Date: 06/06/2017 SLP Individual Time: 2426-8341 SLP Individual Time Calculation (min): 45 min  Short Term Goals: Week 1: SLP Short Term Goal 1 (Week 1): Pt will consume dysphagia 1 with nectar thick liquids with minimal overt s/s of aspiration and Mod A cues for use of compensatory swallow strategies.  SLP Short Term Goal 2 (Week 1): Pt will consume trials of thin liquids with Mod A cues for chin tuck and minimal overt s/s of aspiration.  SLP Short Term Goal 3 (Week 1): Pt will demonstrate intellectual awareness by answering yes/no questions regarding physical and swallowing deficits with Mod A cues.  SLP Short Term Goal 4 (Week 1): Pt will scan to midline to locate objects in 50% of opportunities and Max A cues.  SLP Short Term Goal 5 (Week 1): Pt will complete basic familiar tasks with Min A cues.  Skilled Therapeutic Interventions: Skilled treatment session focused on dysphagia and cognition goals. SLP facilitated session by providing skilled observation of pt consuming thin water via cup sips. Difficult for pt to coordinate chin tuck with swallow. Despite Max A cues, pt frequently swallows before chin tuck. when pt does perform chin tuck prior to swallow, pt presents with multiple swallows and anterior collection of bolus that spills when pt raises head to neutral. Without chin tuck, pt with 1 swallow per bolus but delayed cough and delayed wetness. Review of pt's previous MBS (05/26/17) pt with silent aspiration of thins without use of chin tuck. A repeat instrumental swallow study appears indicated as chin Tuck is not effective strategy to return pt to thin liquids and pt also appears to also have increased sensation when swallowing thin liquids. Will schedule at next available opportunity. Pt increased sustained attention to task with Min A cues for ~ 30  minutes. Pt was left upright in wheelchair with safety belt in place and all needs within reach. Continue per current plan of care.       Function:  Eating Eating   Modified Consistency Diet: No(Trials of thin liquids with SLP only) Eating Assist Level: Set up assist for;Supervision or verbal cues;More than reasonable amount of time   Eating Set Up Assist For: Opening containers       Cognition Comprehension Comprehension assist level: Understands basic 75 - 89% of the time/ requires cueing 10 - 24% of the time  Expression   Expression assist level: Expresses basic 75 - 89% of the time/requires cueing 10 - 24% of the time. Needs helper to occlude trach/needs to repeat words.  Social Interaction Social Interaction assist level: Interacts appropriately 90% of the time - Needs monitoring or encouragement for participation or interaction.  Problem Solving Problem solving assist level: Solves basic 75 - 89% of the time/requires cueing 10 - 24% of the time  Memory Memory assist level: Recognizes or recalls 90% of the time/requires cueing < 10% of the time    Pain Pain Assessment Pain Score: 0-No pain  Therapy/Group: Individual Therapy  Derek Hays 06/06/2017, 12:25 PM

## 2017-06-06 NOTE — Progress Notes (Signed)
Subjective/Complaints: Difficult to awaken this morning.  No issues noted by nursing  ROS: pt denies nausea, vomiting, diarrhea, cough, shortness of breath or chest pain   Objective: Vital Signs: Blood pressure 108/72, pulse 60, temperature 97.8 F (36.6 C), temperature source Oral, resp. rate 17, height 6' (1.829 m), weight 100.5 kg (221 lb 9 oz), SpO2 95 %. No results found. Results for orders placed or performed during the hospital encounter of 05/30/17 (from the past 72 hour(s))  Urinalysis, Routine w reflex microscopic     Status: None   Collection Time: 06/03/17  5:08 PM  Result Value Ref Range   Color, Urine YELLOW YELLOW   APPearance CLEAR CLEAR   Specific Gravity, Urine 1.020 1.005 - 1.030   pH 6.0 5.0 - 8.0   Glucose, UA NEGATIVE NEGATIVE mg/dL   Hgb urine dipstick NEGATIVE NEGATIVE   Bilirubin Urine NEGATIVE NEGATIVE   Ketones, ur NEGATIVE NEGATIVE mg/dL   Protein, ur NEGATIVE NEGATIVE mg/dL   Nitrite NEGATIVE NEGATIVE   Leukocytes, UA NEGATIVE NEGATIVE    Comment: Performed at [facility] [address], [place], [place] [address]  Urine Culture     Status: None   Collection Time: 06/03/17  5:10 PM  Result Value Ref Range   Specimen Description URINE, CATHETERIZED    Special Requests NONE    Culture      NO GROWTH Performed at [facility], [place] [address], [place],  [address]    Report Status 06/05/2017 FINAL      HEENT: Exhibits pocketing during feeding Cardio: RRR without murmur. No JVD  Resp: CTA Bilaterally without wheezes or rales. Normal effort   GI: BS positive and Nontender and non-distended Extremity:  Edema Dorsum of left hand and wrist Skin:   Other Multiple ecchymoses left upper extremity Neuro: Alert/Oriented, Abnormal Sensory Decreased sensation left upper and left lower limb, Abnormal Motor 0/5 left upper extremity, 2- hip knee extensor synergy in the left lower extremity otherwise 0.  Right side is normal, Tone:   Hypotonia, Dysarthric and Inattention--- no changes Musc/Skel:  Other No pain with upper or lower limb range of motion General no acute distress   Assessment/Plan: 1. 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 Physiatrist is providing close team supervision and 24 hour management of active medical problems listed below. Physiatrist and rehab team continue to assess barriers to discharge/monitor patient progress toward functional and medical goals. FIM: Function - Bathing Position: Wheelchair/chair at sink Body parts bathed by patient: Abdomen, Chest Body parts bathed by helper: Right arm, Right lower leg, Left arm, Left lower leg, Front perineal area, Buttocks, Right upper leg, Left upper leg, Back Assist Level: 2 helpers  Function- Upper Body Dressing/Undressing What is the patient wearing?: Pull over shirt/dress Pull over shirt/dress - Perfomed by helper: Thread/unthread right sleeve, Thread/unthread left sleeve, Put head through opening, Pull shirt over trunk Assist Level: (Total A) Function - Lower Body Dressing/Undressing What is the patient wearing?: Pants, Socks, Shoes Position: Bed Pants- Performed by helper: Thread/unthread right pants leg, Thread/unthread left pants leg, Pull pants up/down Non-skid slipper socks- Performed by helper: Don/doff right sock, Don/doff left sock Socks - Performed by helper: Don/doff right sock, Don/doff left sock Shoes - Performed by helper: Don/doff right shoe, Don/doff left shoe, Fasten right, Fasten left Assist for footwear:  Dependant Assist for lower body dressing: 2 Helpers  Function - Toileting Toileting activity did not occur: N/A(gown) Toileting steps completed by helper: Adjust clothing prior to toileting, Performs perineal hygiene, Adjust  clothing after toileting Assist level: Two helpers  Function - Air cabin crew transfer activity did not occur: Safety/medical concerns Toilet transfer assistive device: Bedside commode, Mechanical lift Mechanical lift: Maximove Assist level to bedside commode (at bedside): 2 helpers Assist level from bedside commode (at bedside): 2 helpers  Function - Chair/bed transfer Chair/bed transfer method: Lateral scoot Chair/bed transfer assist level: Moderate assist (Pt 50 - 74%/lift or lower) Chair/bed transfer assistive device: Sliding board, Armrests  Function - Locomotion: Wheelchair Will patient use wheelchair at discharge?: Yes Type: Manual(tilt in space wc) Wheelchair activity did not occur: Safety/medical concerns Assist Level: Dependent (Pt equals 0%)(for transport) Wheel 50 feet with 2 turns activity did not occur: Safety/medical concerns Wheel 150 feet activity did not occur: Safety/medical concerns Function - Locomotion: Ambulation Ambulation activity did not occur: Safety/medical concerns Walk 10 feet activity did not occur: Safety/medical concerns Walk 50 feet with 2 turns activity did not occur: Safety/medical concerns Walk 150 feet activity did not occur: Safety/medical concerns Walk 10 feet on uneven surfaces activity did not occur: Safety/medical concerns  Function - Comprehension Comprehension: Auditory Comprehension assistive device: Hearing aids Comprehension assist level: Understands basic 75 - 89% of the time/ requires cueing 10 - 24% of the time  Function - Expression Expression: Verbal Expression assist level: Expresses basic 75 - 89% of the time/requires cueing 10 - 24% of the time. Needs helper to occlude trach/needs to repeat words.  Function - Social Interaction Social Interaction assist level: Interacts appropriately 90% of the time - Needs monitoring or encouragement for participation or interaction.  Function - Problem Solving Problem solving  assist level: Solves basic 75 - 89% of the time/requires cueing 10 - 24% of the time  Function - Memory Memory assist level: Recognizes or recalls 90% of the time/requires cueing < 10% of the time Patient normally able to recall (first 3 days only): That he or she is in a hospital, Current season  Medical Problem List and Plan: 1.Left hemiparesis, left neglect, left hemisensory deficitssecondary to right MCA infarction/distal M1 segment occlusion status post revascularizationas well as history of traumatic SDH September 2019 with follow-up CT the head 05/10/2017 showed SDH completely reabsorbed.  CIR PT, OT, SLP , likely will require SNF 2. DVT Prophylaxis/Anticoagulation: Subcutaneous Lovenox. Monitor platelet counts in any signs of bleeding.Plan to beginELIQUIS  06/09/2017 5 mg twice a day due to size of infarction. No need to reimage. Discontinue Lovenox and aspirin onceELIQUISresumed 3. Pain Management:Lyrica 100 mg twice a day, Tylenol as needed, no neuropathic pain c/os 4. Mood:Provide emotional support 5. Neuropsych: This patientiscapable of making decisions on hisown behalf. 6. Skin/Wound Care:Routine skin checks 7. Fluids/Electrolytes/Nutrition:Routine I&O's low intake may need IVF but BMET shows stable BUN and Creat 8.Dysphagia. Dysphagia #1 nectar liquids.  -continue to follow for tolerance    Recheck labs Normal 9.Hypertension. Tenormin 50 mg daily.  Fair control at present Vitals:   06/05/17 1500 06/06/17 0500  BP: 114/70 108/72  Pulse: 85 60  Resp: 18 17  Temp: 97.8 F (36.6 C) 97.8 F (36.6 C)  SpO2:  95%  Controlled 2/21 10.Atrial fibrillation with history of pacemaker. Cardiac rate controlled. Patient to follow-up with Dr[facility]cardiology [facility]. 11.Hypothyroidism. Synthroid 12.Hyperlipidemia. Zocor 13.  Constipation: lax/soft + Dulcolax suppository today if needed.  Last BM recorded 06/02/2017  LOS (Days) 7 A FACE TO FACE EVALUATION  WAS PERFORMED  Derek Hays 06/06/2017, 8:37 AM

## 2017-06-06 NOTE — Progress Notes (Signed)
Physical Therapy Session Note  Patient Details  Name: Derek Hays MRN: [id number] Date of Birth: [birth year]  Today's Date: 06/05/2017 PT Individual Time:1415-1515   60 min   Short Term Goals: Week 1:  PT Short Term Goal 1 (Week 1): Pt will perform sit<>supine with max assist  PT Short Term Goal 2 (Week 1): Pt will maintain sitting balance x 1 min with mod assist  PT Short Term Goal 3 (Week 1): Pt will initiate WC mobility  PT Short Term Goal 4 (Week 1): Pt will transfer to [place] with max assist of 1   Skilled Therapeutic Interventions/Progress Updates:   Pt received sitting in WC and agreeable to PT  PT treatment focused on upright/standing tolerance, Postural control and L inattention. Pt transported to day room. Standing frame 2 bouts x 8 min each. Forced WB through R forearm, no complaints of pain. Visual scanning to R and L to identify letters as well as forward reaching the LUE to improve postural control on the L. Sit<>stand in parallel bars x 4 with max assist. Max cues for UE placement, anterior and R weight shifting to minimize effects of pushers syndrome. Standing tolerance with max assist and L knee block up to 1 min with max cues for upright head and trunk posture.   Patient returned to room and left sitting in [facility] with call bell in reach and all needs met.         Therapy Documentation Precautions:  Precautions Precautions: Fall Precaution Comments:  neglect, severe L hemiparesis Restrictions Weight Bearing Restrictions: No Vital Signs: Therapy Vitals Temp: 97.8 F (36.6 C) Temp Source: Oral Pulse Rate: 60 Resp: 17 BP: 108/72 Patient Position (if appropriate): Lying Oxygen Therapy SpO2: 95 % O2 Device: Not Delivered Pain: 0/10 at rest.   See Function Navigator for Current Functional Status.   Therapy/Group: Individual Therapy  [name] 06/06/2017, 5:52 AM

## 2017-06-07 ENCOUNTER — Inpatient Hospital Stay (HOSPITAL_COMMUNITY): Payer: TRICARE For Life (TFL)

## 2017-06-07 ENCOUNTER — Inpatient Hospital Stay (HOSPITAL_COMMUNITY): Payer: Medicare Other

## 2017-06-07 ENCOUNTER — Inpatient Hospital Stay (HOSPITAL_COMMUNITY): Payer: Medicare Other | Admitting: Occupational Therapy

## 2017-06-07 ENCOUNTER — Inpatient Hospital Stay (HOSPITAL_COMMUNITY): Payer: TRICARE For Life (TFL) | Admitting: Speech Pathology

## 2017-06-07 ENCOUNTER — Encounter (HOSPITAL_COMMUNITY): Payer: TRICARE For Life (TFL) | Admitting: Speech Pathology

## 2017-06-07 ENCOUNTER — Inpatient Hospital Stay (HOSPITAL_COMMUNITY): Payer: TRICARE For Life (TFL) | Admitting: Physical Therapy

## 2017-06-07 LAB — GLUCOSE, CAPILLARY: Glucose-Capillary: 119 mg/dL — ABNORMAL HIGH (ref 65–99)

## 2017-06-07 NOTE — Progress Notes (Signed)
Social Work   Ege Muckey, Eliezer Champagne  Social Worker  Physical Medicine and Rehabilitation  Patient Care Conference  Signed  Date of Service:  06/05/2017 12:47 PM          Signed          [] Hide copied text  [] Hover for details   Inpatient RehabilitationTeam Conference and Plan of Care Update Date: 06/05/2017   Time: 10:45 AM      Patient Name: Derek Hays      Medical Record Number: [id number]  Date of Birth: 09/11/1930 Sex: Male         Room/Bed: 4W07C/4W07C-01 Payor Info: Payor: MEDICARE / Plan: MEDICARE PART A AND B / Product Type: *No Product type* /     Admitting Diagnosis: Rt CVA  Admit Date/Time:  05/30/2017  3:17 PM Admission Comments: No comment available    Primary Diagnosis:  <principal problem not specified> Principal Problem: <principal problem not specified>       Patient Active Problem List    Diagnosis Date Noted  . 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 Stroke (cerebrum) ([place]) 05/27/2017      Expected Discharge Date: Expected Discharge Date: 06/14/17   Team Members Present: Physician leading conference: Dr. Alysia Penna Social Worker Present: Ovidio Kin, LCSW Nurse Present: Other (comment)(Denise Lloyd-RN) PT Present: Barrie Folk, PT;Rosita Dechalus, PTA OT Present: Napoleon Form, OT SLP Present: Stormy Fabian, SLP PPS Coordinator present : Daiva Nakayama, RN, CRRN       Current Status/Progress Goal Weekly Team Focus  Medical     Patient with severe left hemiparesis and left neglect.  Has awareness of deficits  Upgrade mobility to 1 person assist, maintain medical stability  Work on bladder emptying   Bowel/Bladder       Continence   Incontinent B & B-urinary rentension   Timed tolieting-MD bladder meds  Swallow/Nutrition/ Hydration     dysphagia 1 with nectar thick liquids  Min A to supervision with least restrictive diet  consumption of dysphagia 1 with nectar  trials of water with SLP only and Max A cues to use chin tuck   ADL's     Mod-max A sitting balance; +2 sliding board transfers; total A +2 LB dressing; max A UB bathing/dressing  Min A overall will likely downgrade due to slow pt progress  Static sitting banace, functional transfers, ADL re-training, neuro re-ed   Mobility     MaxA bed mobility, max/maxA sitting balance, maxA x 2 SB transfers, total assist standing  min-modA  sitting balance, transfers, L NMR, d/c planning   Communication               Safety/Cognition/ Behavioral Observations   Mod A to Max A for basic problem solving, possibly d/t left inattention and no overall awareness  Min A with basic  basic problem solving, overall attention and continuation of tasks, intellectual awareness   Pain               Skin          monitor skin currently no issues       *See Care Plan and progress notes for long and short-term goals.      Barriers to Discharge   Current Status/Progress Possible Resolutions Date Resolved   Physician     Neurogenic Bowel & Bladder;Decreased caregiver support;Medical stability  Requiring intermittent catheterization, wife cannot provide physical assistance  Slow progress towards  goals  Likely will need SNF      Nursing                 PT                    OT                 SLP Lack of/limited family support            SW              Discharge Planning/Teaching Needs:  Wife has health issues of her own and can not provide physical assist, will probably need NHP      Team Discussion:  Goals mod/max level of assist. Pt has had a severe CVA. I & O cathing required due to retention. MBS on Friday currently on Dys 1 nectar thick liquid diet. Pain in shoulder MD aware of. Currently total assist plus 2. Do not feel wife will be able to provide the care pt will require due to her own health issues.  Revisions to Treatment Plan:  NHP    Continued Need for Acute Rehabilitation Level of Care: The  patient requires daily medical management by a physician with specialized training in physical medicine and rehabilitation for the following conditions: Daily direction of a multidisciplinary physical rehabilitation program to ensure safe treatment while eliciting the highest outcome that is of practical value to the patient.: Yes Daily medical management of patient stability for increased activity during participation in an intensive rehabilitation regime.: Yes Daily analysis of laboratory values and/or radiology reports with any subsequent need for medication adjustment of medical intervention for : Neurological problems;Urological problems   [name] 06/07/2017, 8:56 AM                 Patient ID: Derek Hays, male   DOB: 17-Jul-1930, 84 y.o.   MRN: [id number]

## 2017-06-07 NOTE — Progress Notes (Signed)
Occupational Therapy Session Note  Patient Details  Name: Derek Hays MRN: [id number] Date of Birth: 04-17-1930  Today's Date: 06/07/2017 OT Individual Time: 1300-1400 OT Individual Time Calculation (min): 60 min    Short Term Goals: Week 1:  OT Short Term Goal 1 (Week 1): Pt will transfer with assist +1 in order to decrease caregiver burden OT Short Term Goal 1 - Progress (Week 1): Not met OT Short Term Goal 2 (Week 1): Pt will attend to L UE during bathing task with mod cuing OT Short Term Goal 2 - Progress (Week 1): Met OT Short Term Goal 3 (Week 1): Pt will sit EOB with mod A in prep for functional task OT Short Term Goal 3 - Progress (Week 1): Met  Skilled Therapeutic Interventions/Progress Updates:    1:1. Pt asleep upon arrival requiring increased time and multi modal cueing to arouse. Pt repostitioned in bed HOB elevated to upright to begin self feeding. OT sets up tray with all items L of midline requring increased VC at beginning of session to turn head L of midline and locate/retrieve items. Pt requires mod Vc for following swallwing strategies with 1 episode of coughing. OT facilitates gross grasp of pudding/magic cup with LUE and pt able to scan L with increased time and VC with magic cup in LUE (decreased VC from just locating items on tray). Exited session with pt seated in bed, wife present and call light in reach.  Therapy Documentation Precautions:  Precautions Precautions: Fall Precaution Comments:  neglect, severe L hemiparesis Restrictions Weight Bearing Restrictions: No  See Function Navigator for Current Functional Status.   Therapy/Group: Individual Therapy  Tonny Branch 06/07/2017, 1:58 PM

## 2017-06-07 NOTE — Progress Notes (Signed)
Occupational Therapy Session Note  Patient Details  Name: Derek Hays MRN: [id number] Date of Birth: Dec 09, 1930  Today's Date: 06/07/2017 OT Individual Time: 1440-1510 OT Individual Time Calculation (min): 30 min    Short Term Goals: Week 2:  OT Short Term Goal 1 (Week 2): Pt will transfer with max A +1 in order to reduce caregiver burden. OT Short Term Goal 2 (Week 2): Pt will maintain static sitting balance on BSC without suport of mechanical lift with min A OT Short Term Goal 3 (Week 2): Pt will roll to R with mod In during bed level ADL  Skilled Therapeutic Interventions/Progress Updates:    Treatment session with focus on sitting balance and visual attention to Lt.  Pt received supine in bed reporting desire to shave face.  Engaged in bed mobility with total assist to position LUE/LLE to assist with rolling.  Engaged in shaving face seated at EOB with +2 to provide tactile cues to improve posture while also providing verbal cues.  Mod assist for sitting balance and trunk, also utilized reaching of LUE out to Lt to incorporate weight bearing and promote weight shift to Lt for improved midline sitting posture.  Utilized mirror in front of pt to visually attend to Lt side of face.  Pt initially applied shaving cream only to Rt side of face, requiring cues and assist to apply to Lt.  Pt completed shaving task with cues to utilize mirror to locate far Lt side of face for thoroughness of shaving.  Returned to bed with +2 and left semi-reclined with all needs in reach.  Therapy Documentation Precautions:  Precautions Precautions: Fall Precaution Comments:  neglect, severe L hemiparesis Restrictions Weight Bearing Restrictions: No General:   Vital Signs: Therapy Vitals Temp: 97.6 F (36.4 C) Temp Source: Oral Pulse Rate: (!) 59 Resp: 15 BP: 110/65 Patient Position (if appropriate): Lying Oxygen Therapy SpO2: 99 % O2 Device: Not Delivered Pain:  Pt with no c/o pain  See  Function Navigator for Current Functional Status.   Therapy/Group: Individual Therapy  Simonne Come 06/07/2017, 3:43 PM

## 2017-06-07 NOTE — Progress Notes (Signed)
Speech Language Pathology Weekly Progress and Session Note  Patient Details  Name: Derek Hays MRN: [id number] Date of Birth: 1931/01/09  Beginning of progress report period: May 31, 2017 End of progress report period: June 07, 2016  Today's Date: 06/07/2017 SLP Individual Time: 1130-1200 SLP Individual Time Calculation (min): 30 min  Short Term Goals: Week 1: SLP Short Term Goal 1 (Week 1): Pt will consume dysphagia 1 with nectar thick liquids with minimal overt s/s of aspiration and Mod A cues for use of compensatory swallow strategies.  SLP Short Term Goal 1 - Progress (Week 1): Met SLP Short Term Goal 2 (Week 1): Pt will consume trials of thin liquids with Mod A cues for chin tuck and minimal overt s/s of aspiration.  SLP Short Term Goal 2 - Progress (Week 1): Not met SLP Short Term Goal 3 (Week 1): Pt will demonstrate intellectual awareness by answering yes/no questions regarding physical and swallowing deficits with Mod A cues.  SLP Short Term Goal 3 - Progress (Week 1): Not met SLP Short Term Goal 4 (Week 1): Pt will scan to midline to locate objects in 50% of opportunities and Max A cues.  SLP Short Term Goal 4 - Progress (Week 1): Not met SLP Short Term Goal 5 (Week 1): Pt will complete basic familiar tasks with Min A cues. SLP Short Term Goal 5 - Progress (Week 1): Not met    New Short Term Goals: Week 2: SLP Short Term Goal 1 (Week 2): Pt will consume dysphagia 1 with nectar thick liquids with minimal overt s/s of aspiration and Min A cues for use of compensatory swallow strategies.  SLP Short Term Goal 2 (Week 2): Pt will consume trials of thin liquids with minimal overt s/s of aspiration with Mod A cues for compensatory swallow strategies.  SLP Short Term Goal 3 (Week 2): Pt will demonstrate intellectual awareness by answering yes/no questions regarding physical and swallowing deficits with Mod A cues.  SLP Short Term Goal 4 (Week 2): Pt will scan to midline to  locate objects in 50% of opportunities and Max A cues.  SLP Short Term Goal 5 (Week 2): Pt will complete basic familiar tasks with Min A cues.  Weekly Progress Updates: Pt with minimal progress this reporting period and as a result continues to require extensive assistance completing all ADLs and safely consuming advanced diet textures. Recent completion of MBS doesn't indicate any further diet upgrade at this time. Pt with little improvement in awareness, midline scanning and completion of familiar tasks. Skilled ST continues to be required to target significant deficits. Anticipate that pt will require skilled nursing assistance at time of discharge.     Daily Session  Skilled Therapeutic Interventions: Skilled treatment session focused on cognition and dysphagia goals. SLP facilitated session by providing skilled observation of pt consuming thin liquids. Pt with no overt s/s of aspiration but efficiency of pt's swallow continues to be impaired as evidenced by  Increased work of breathing, reports in decreased ability to breath and intermittent wet vocal quality. Recommend continuing trials of thin liquids with ST. Pt with no recall of MBS earlier in morning. Pt was left in bed with all needs within reach. Continue per current plan of care.     Function:   Eating Eating   Modified Consistency Diet: No(Trials of water with SLP) Eating Assist Level: Set up assist for;Supervision or verbal cues;More than reasonable amount of time   Eating Set Up Assist For: Opening containers  Helper [place] to Mouth: Occasionally   Cognition Comprehension Comprehension assist level: Understands basic 75 - 89% of the time/ requires cueing 10 - 24% of the time;Understands basic 50 - 74% of the time/ requires cueing 25 - 49% of the time  Expression   Expression assist level: Expresses basic 75 - 89% of the time/requires cueing 10 - 24% of the time. Needs helper to occlude trach/needs to repeat  words.;Expresses basic 50 - 74% of the time/requires cueing 25 - 49% of the time. Needs to repeat parts of sentences.  Social Interaction Social Interaction assist level: Interacts appropriately 90% of the time - Needs monitoring or encouragement for participation or interaction.  Problem Solving Problem solving assist level: Solves basic 75 - 89% of the time/requires cueing 10 - 24% of the time;Solves basic 50 - 74% of the time/requires cueing 25 - 49% of the time  Memory Memory assist level: Recognizes or recalls 75 - 89% of the time/requires cueing 10 - 24% of the time   General    Pain Pain Assessment Pain Assessment: No/denies pain Pain Score: 0-No pain  Therapy/Group: Individual Therapy  Linas Stepter 06/07/2017, 12:29 PM

## 2017-06-07 NOTE — Progress Notes (Signed)
Subjective/Complaints: More alert, no issues overnight  ROS: pt denies nausea, vomiting, diarrhea, cough, shortness of breath or chest pain   Objective: Vital Signs: Blood pressure 123/67, pulse 60, temperature 98.7 F (37.1 C), temperature source Oral, resp. rate 17, height 6' (1.829 m), weight 100.5 kg (221 lb 9 oz), SpO2 98 %. No results found. No results found for this or any previous visit (from the past 72 hour(s)).   HEENT: Exhibits pocketing during feeding Cardio: RRR without murmur. No JVD  Resp: CTA Bilaterally without wheezes or rales. Normal effort   GI: BS positive and Nontender and non-distended Extremity:  Edema Dorsum of left hand and wrist Skin:   Other Multiple ecchymoses left upper extremity Neuro: Alert/Oriented, Abnormal Sensory Decreased sensation left upper and left lower limb, Abnormal Motor tr/5 left upper extremity biceps, 2- hip knee extensor synergy in the left lower extremity otherwise 0.  Right side is normal, Tone:  Hypotonia, Dysarthric and Inattention--- no changes Musc/Skel:  Other No pain with upper or lower limb range of motion General no acute distress   Assessment/Plan: 1. 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 Physiatrist is providing close team supervision and 24 hour management of active medical problems listed below. Physiatrist and rehab team continue to assess barriers to discharge/monitor patient progress toward functional and medical goals. FIM: Function - Bathing Position: Wheelchair/chair at sink Body parts bathed by patient: Abdomen, Chest Body parts bathed by helper: Right arm, Right lower leg, Left arm, Left lower leg, Front perineal area, Buttocks, Right upper leg, Left upper leg, Back Assist Level: 2 helpers  Function- Upper  Body Dressing/Undressing What is the patient wearing?: Pull over shirt/dress Pull over shirt/dress - Perfomed by helper: Thread/unthread right sleeve, Thread/unthread left sleeve, Put head through opening, Pull shirt over trunk Assist Level: (Total A) Function - Lower Body Dressing/Undressing What is the patient wearing?: Pants, Socks, Shoes Position: Bed Pants- Performed by helper: Thread/unthread right pants leg, Thread/unthread left pants leg, Pull pants up/down Non-skid slipper socks- Performed by helper: Don/doff right sock, Don/doff left sock Socks - Performed by helper: Don/doff right sock, Don/doff left sock Shoes - Performed by helper: Don/doff right shoe, Don/doff left shoe, Fasten right, Fasten left Assist for footwear: Dependant Assist for lower body dressing: 2 Helpers  Function - Toileting Toileting activity did not occur: N/A(gown) Toileting steps completed by helper: Adjust clothing prior to toileting, Performs perineal hygiene, Adjust clothing after toileting Assist level: Two helpers  Function - Air cabin crew transfer activity did not occur: Safety/medical concerns Toilet transfer assistive device: Bedside commode, Mechanical lift Mechanical lift: Maximove Assist level to bedside commode (at bedside): 2 helpers Assist level from bedside commode (at bedside): 2 helpers  Function - Chair/bed transfer Chair/bed transfer method: Lateral scoot Chair/bed transfer assist level: Moderate assist (Pt 50 - 74%/lift or lower) Chair/bed transfer assistive device: Sliding board, Armrests  Function - Locomotion: Wheelchair Will patient use wheelchair at discharge?: Yes Type: Manual(tilt in space wc) Wheelchair activity did not occur: Safety/medical concerns Assist Level: Dependent (Pt equals 0%)(for transport) Wheel 50 feet with 2 turns activity did not occur: Safety/medical concerns Wheel 150 feet activity did not occur: Safety/medical concerns Function -  Locomotion: Ambulation Ambulation activity did not occur: Safety/medical concerns Walk 10 feet activity did not occur: Safety/medical concerns Walk 50 feet with 2 turns activity  did not occur: Safety/medical concerns Walk 150 feet activity did not occur: Safety/medical concerns Walk 10 feet on uneven surfaces activity did not occur: Safety/medical concerns  Function - Comprehension Comprehension: Auditory Comprehension assistive device: Hearing aids Comprehension assist level: Understands basic 75 - 89% of the time/ requires cueing 10 - 24% of the time  Function - Expression Expression: Verbal Expression assist level: Expresses basic 75 - 89% of the time/requires cueing 10 - 24% of the time. Needs helper to occlude trach/needs to repeat words.  Function - Social Interaction Social Interaction assist level: Interacts appropriately 90% of the time - Needs monitoring or encouragement for participation or interaction.  Function - Problem Solving Problem solving assist level: Solves basic 75 - 89% of the time/requires cueing 10 - 24% of the time  Function - Memory Memory assist level: Recognizes or recalls 90% of the time/requires cueing < 10% of the time Patient normally able to recall (first 3 days only): That he or she is in a hospital, Current season  Medical Problem List and Plan: 1.Left hemiparesis, left neglect, left hemisensory deficitssecondary to right MCA infarction/distal M1 segment occlusion status post revascularizationas well as history of traumatic SDH September 2019 with follow-up CT the head 05/10/2017 showed SDH completely reabsorbed.  CIR PT, OT, SLP , likely will require SNF 2. DVT Prophylaxis/Anticoagulation: Subcutaneous Lovenox. Monitor platelet counts in any signs of bleeding.Plan to beginELIQUIS  06/10/2017 5 mg twice a day due to size of infarction. No need to reimage. Discontinue Lovenox and aspirin onceELIQUISresumed 3. Pain Management:Lyrica 100 mg  twice a day, Tylenol as needed, no neuropathic pain c/os 4. Mood:Provide emotional support 5. Neuropsych: This patientiscapable of making decisions on hisown behalf. 6. Skin/Wound Care:Routine skin checks 7. Fluids/Electrolytes/Nutrition:Routine I&O's low intake may need IVF but BMET shows stable BUN and Creat 8.Dysphagia. Dysphagia #1 nectar liquids.  -continue to follow for tolerance    Recheck labs Normal 9.Hypertension. Tenormin 50 mg daily.  Fair control at present Vitals:   06/06/17 1500 06/07/17 0048  BP: 125/61 123/67  Pulse: (!) 59 60  Resp: 18 17  Temp: 97.8 F (36.6 C) 98.7 F (37.1 C)  SpO2: 99% 98%  Controlled 2/22 10.Atrial fibrillation with history of pacemaker. Cardiac rate controlled. Patient to follow-up with [facility] 11.Hypothyroidism. Synthroid 12.Hyperlipidemia. Zocor 13.  Constipation: lax/soft + Dulcolax suppository today if needed.   LOS (Days) 8 A FACE TO FACE EVALUATION WAS PERFORMED  Derek Hays 06/07/2017, 8:22 AM

## 2017-06-07 NOTE — Progress Notes (Signed)
Modified Barium Swallow Progress Note  Patient Details  Name: Derek Hays MRN: [id number] Date of Birth: 04-09-1931  Today's Date: 06/07/2017  Modified Barium Swallow completed.  Full report located under Chart Review in the Imaging Section.  Brief recommendations include the following:  Clinical Impression  Pt presents with moderate oral phase dysphagia c/b significant difficulty management liquid and food boluses, difficulty with AP transfer of of all boluses and anterior spillage on left. Pt with moderate sensory motor pharyngeal impairments c/b delayed swallow intiation to vallecula with nectar thick liquids and as bolus transitions from vallecula to pyriform sinus with boluses of thin liquids. When consuming thin liquids by spoon, pt with penetration during swallow and when consuming by cup sips pt with consistent deep penetration before and during swallow. This deep penetration with liquids leads to accumulating residue on underside of epiglottis extending down into laryngeal vestibule. Pt's strength deficits are also c/b widespread pharyngeal residue throughout study. Pt with no sensation of penetration or residue. Pt's swallow is further impacted by bolus difficulty passing thru UES. Thiin liquids boluses had difficulty passing thru UES and during closure of UES, some of thin liquid bolus was squeezed back into pyriform sinuses with increased residue remaining in pyriform sinuses. Although pt didn't aspirate any liquids or textures, pt presents with overall decreased effecieny when swallowing. With thin liquids, pt presents with increased work of breath and reports difficulty with breathing after thin liquids bolus. Pt's efficiencyis further impacted by overall significant faitgue, endurance, awareness, decreased ambulatory abilities and decreased cognitive abilities. Therefore I am recommending pt remain on most conservative diet of dysphagia 1 with  nectar thick liquids, medicine whole with  puree and full supervision during meals. I would recommend possibly initiating water procotol as pt's endurance and cognitive status improves   Swallow Evaluation Recommendations       SLP Diet Recommendations: Dysphagia 1 (Puree) solids;Nectar thick liquid   Liquid Administration via: Straw   Medication Administration: Whole meds with puree   Supervision: Staff to assist with self feeding   Compensations: Slow rate;Small sips/bites;Lingual sweep for clearance of pocketing;Monitor for anterior loss;Clear throat intermittently   Postural Changes: Remain semi-upright after after feeds/meals (Comment);Seated upright at 90 degrees   Oral Care Recommendations: Oral care BID   Other Recommendations: Have oral suction available    Orazio Weller 06/07/2017,12:22 PM

## 2017-06-07 NOTE — Progress Notes (Signed)
Physical Therapy Weekly Progress Note  Patient Details  Name: Derek Hays MRN: [id number] Date of Birth: 1930-11-09  Beginning of progress report period: May 31, 2017 End of progress report period: June 07, 2017  Today's Date: 06/07/2017 PT Individual Time:808-905    57 min   Patient has met 3 of 4 short term goals.  Pt making slow gains towards long term goals. L neglect, hypotonia, and poor proprioception in the LUE/LLE limit further progress at this time. Pt currently requires max assist to Max assist +2 for SB transfers, max assist for sit<>stand transfers in parallel bars, max assist be mobility, and min-supervision sitting balance.   Patient continues to demonstrate the following deficits muscle weakness and muscle joint tightness, decreased cardiorespiratoy endurance, impaired timing and sequencing, abnormal tone, unbalanced muscle activation, decreased coordination and decreased motor planning, decreased midline orientation, decreased attention to left and left side neglect, decreased initiation, decreased attention, decreased awareness, decreased problem solving, decreased safety awareness, decreased memory and delayed processing and decreased sitting balance, decreased standing balance, decreased postural control, hemiplegia and decreased balance strategies and therefore will continue to benefit from skilled PT intervention to increase functional independence with mobility.  Patient progressing toward long term goals..  Continue plan of care.  PT Short Term Goals Week 1:  PT Short Term Goal 1 (Week 1): Pt will perform sit<>supine with max assist  PT Short Term Goal 1 - Progress (Week 1): Met PT Short Term Goal 2 (Week 1): Pt will maintain sitting balance x 1 min with mod assist  PT Short Term Goal 2 - Progress (Week 1): Met PT Short Term Goal 3 (Week 1): Pt will initiate WC mobility  PT Short Term Goal 3 - Progress (Week 1): Progressing toward goal PT Short Term Goal 4  (Week 1): Pt will transfer to [facility] with max assist of 1  PT Short Term Goal 4 - Progress (Week 1): Met Week 2:  PT Short Term Goal 1 (Week 2): Pt will maintain sitting balance with supervision assist x 10 minutes.  PT Short Term Goal 2 (Week 2): Pt will propell WC 184f with min assist  PT Short Term Goal 3 (Week 2): [facility]  PT Short Term Goal 4 (Week 2): Pt will perform bed mobility with mod assist   Skilled Therapeutic Interventions/Progress Updates:   Pt received supine in bed and agreeable to PT. Don pants with bridge technique. Supine>sit transfer with max assist.   Sitting balance with min-supervision assist x 15 minutes to finish breakfast. Pt noted to require moderate cues for visual scanning to find breakfast on L side of tray. mutilple posterior LOB noted initially, requiring increased assist to prevent posterior LOB.  [facility] L. Max cues for sequencing, safety and improved push through BLE and RUE to prevent sheer forces on gluteal surface. .   UE NMR for shoulder horizontal adduction/adduction with gravity eliminated using hand/forearm roller. 5 x 15 with short rest breaks between sets. Pt noted to have significant improvement in shoulder activation throughout NMRe-ed.   [facility] returned to [facility] and [facility] and all needs met.         Therapy Documentation Precautions:  Precautions Precautions: Fall Precaution Comments:  neglect, severe L hemiparesis Restrictions Weight Bearing Restrictions: No Pain: Pain Assessment Pain Assessment: No/denies pain Pain Score: 0-No pain   See Function Navigator for Current Functional  Status.  Therapy/Group: Individual Therapy  [name] 06/07/2017, 1:16 PM

## 2017-06-08 ENCOUNTER — Inpatient Hospital Stay (HOSPITAL_COMMUNITY): Payer: TRICARE For Life (TFL) | Admitting: Physical Therapy

## 2017-06-08 NOTE — Progress Notes (Signed)
Patient ID: Derek Hays, male   DOB: April 12, 1931, 84 y.o.   MRN: [id number]   Derek Hays is a 84 y.o. 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 Atrial fibrillation ([place])   . Hypertension       Subjective: No new complaints. No new problems. Slept well. Feeling OK.  Objective: Vital signs in last 24 hours: Temp:  [97.6 F (36.4 C)-97.8 F (36.6 C)] 97.8 F (36.6 C) (02/23 0417) Pulse Rate:  [59] 59 (02/23 0417) Resp:  [15-16] 16 (02/23 0417) BP: (98-110)/(65-70) 98/70 (02/23 0417) SpO2:  [99 %] 99 % (02/23 0417) Weight change:  Last BM Date: 06/06/17(per NT, patient disimpacted yesterday by RN)  Intake/Output from previous day: 02/22 0701 - 02/23 0700 In: 360 [P.O.:360] Out: 1300 [Urine:1300] Last cbgs: CBG (last 3)  Recent Labs    06/07/17 2148  GLUCAP 119*   Patient Vitals for the past 24 hrs:  BP Temp Temp src Pulse Resp SpO2  06/08/17 0417 98/70 97.8 F (36.6 C) Oral (!) 59 16 99 %  06/07/17 1459 110/65 97.6 F (36.4 C) Oral (!) 59 15 99 %    Physical Exam General: No apparent distress   HEENT: not dry Lungs: Normal effort. Lungs clear to auscultation, no crackles or wheezes. Cardiovascular: Regular rate and rhythm, no edema Abdomen: S/NT/ND; BS(+) Musculoskeletal:  unchanged Neurological: No new neurological deficits with dense left hemiparesis Wounds: N/A    Skin: clear  Aging changes Mental state: Alert, oriented, cooperative    Lab Results: BMET    Component Value Date/Time   NA 138 06/03/2017 0433   K 4.3 06/03/2017 0433   CL 105 06/03/2017 0433   CO2 24 06/03/2017 0433   GLUCOSE 107 (H) 06/03/2017 0433   BUN 22 (H) 06/03/2017 0433   CREATININE 1.31 (H) 06/03/2017 0433   CALCIUM 8.3 (L) 06/03/2017 0433   GFRNONAA 48 (L) 06/03/2017 0433   GFRAA 56 (L) 06/03/2017 0433   CBC     Component Value Date/Time   WBC 8.2 05/31/2017 0651   RBC 4.06 (L) 05/31/2017 0651   HGB 12.1 (L) 05/31/2017 0651   HCT 36.6 (L) 05/31/2017 0651   PLT 182 05/31/2017 0651   MCV 90.1 05/31/2017 0651   MCH 29.8 05/31/2017 0651   MCHC 33.1 05/31/2017 0651   RDW 13.3 05/31/2017 0651   LYMPHSABS 1.8 05/31/2017 0651   MONOABS 0.8 05/31/2017 0651   EOSABS 0.3 05/31/2017 0651   BASOSABS 0.0 05/31/2017 0651    Studies/Results: Dg Swallowing Func-speech Pathology  Result Date: 06/07/2017 Objective Swallowing Evaluation: Type of Study: MBS-Modified Barium Swallow Study  Patient Details Name: Derek Hays MRN: [id number] Date of Birth: [birth year] Today's Date: 06/07/2017 Time: SLP Start Time (ACUTE ONLY): 1020 -SLP Stop Time (ACUTE ONLY): 1043 SLP Time Calculation (min) (ACUTE ONLY): 23 min Past Medical History: Past Medical History: Diagnosis Date . Atrial fibrillation ([place])  . Hypertension  Past Surgical History: Past Surgical History: Procedure Laterality Date . IR CT HEAD LTD  05/27/2017 . IR PERCUTANEOUS ART THROMBECTOMY/INFUSION INTRACRANIAL INC DIAG ANGIO  05/27/2017 . RADIOLOGY WITH ANESTHESIA N/A 05/27/2017  Procedure: RADIOLOGY WITH ANESTHESIA;  Surgeon: Luanne Bras, MD;  Location: [facility];  Service: Radiology;  Laterality: N/A; HPI: Mr.Ikaika C Harrellis a 84 y.o.malewith history of A. fibs/ppacemaker not on AC due to recent?? SDH, hypertensionadmitted foracute onset  left-sided weaknessand right gaze. No tPA given due toleftPCA in the right MCA hypodensity on CT.Status post endovascular treatment. MRI shows Acute right MCA infarct involving the right insula and frontal parietal lobe. Small acute or subacute infarct left parietal lobe. Subacute infarct left occipital lobe. Findings suggest emboli.  No Data Recorded Assessment / Plan / Recommendation CHL IP CLINICAL IMPRESSIONS 06/07/2017 Clinical Impression Pt presents with moderate oral phase dysphagia c/b significant difficulty  management liquid and food boluses, difficulty with AP transfer of of all boluses and anterior spillage on left. Pt with moderate sensory motor pharyngeal impairments c/b delayed swallow intiation to vallecula with nectar thick liquids and as bolus transitions from vallecula to pyriform sinus with boluses of thin liquids. When consuming thin liquids by spoon, pt with penetration during swallow and when consuming by cup sips pt with consistent deep penetration before and during swallow. This deep penetration with liquids leads to accumulating residue on underside of epiglottis extending down into laryngeal vestibule. Pt's strength deficits are also c/b widespread pharyngeal residue throughout study. Pt with no sensation of penetration or residue. Pt's swallow is further impacted by bolus difficulty passing thru UES. Thiin liquids boluses had difficulty passing thru UES and during closure of UES, some of thin liquid bolus was squeezed back into pyriform sinuses with increased residue remaining in pyriform sinuses. Although pt didn't aspirate any liquids or textures, pt presents with overall decreased effecieny when swallowing. With thin liquids, pt presents with increased work of breath and reports difficulty with breathing after thin liquids bolus. Pt's efficiencyis further impacted by overall significant faitgue, endurance, awareness, decreased ambulatory abilities and decreased cognitive abilities. Therefore I am recommending pt remain on most conservative diet of dysphagia 1 with  nectar thick liquids, medicine whole with puree and full supervision during meals. I would recommend possibly initiating water procotol as pt's endurance and cognitive status improves SLP Visit Diagnosis Dysphagia, oropharyngeal phase (R13.12) Attention and concentration deficit following -- Frontal lobe and executive function deficit following -- Impact on safety and function Moderate aspiration risk   CHL IP TREATMENT RECOMMENDATION  06/07/2017 Treatment Recommendations Therapy as outlined in treatment plan below   Prognosis 05/29/2017 Prognosis for Safe Diet Advancement Good Barriers to Reach Goals Cognitive deficits Barriers/Prognosis Comment -- CHL IP DIET RECOMMENDATION 06/07/2017 SLP Diet Recommendations Dysphagia 1 (Puree) solids;Nectar thick liquid Liquid Administration via Straw Medication Administration Whole meds with puree Compensations Slow rate;Small sips/bites;Lingual sweep for clearance of pocketing;Monitor for anterior loss;Clear throat intermittently Postural Changes Remain semi-upright after after feeds/meals (Comment);Seated upright at 90 degrees   CHL IP OTHER RECOMMENDATIONS 06/07/2017 Recommended Consults -- Oral Care Recommendations Oral care BID Other Recommendations Have oral suction available   CHL IP FOLLOW UP RECOMMENDATIONS 05/29/2017 Follow up Recommendations Inpatient Rehab   CHL IP FREQUENCY AND DURATION 05/29/2017 Speech Therapy Frequency (ACUTE ONLY) min 2x/week Treatment Duration 2 weeks      CHL IP ORAL PHASE 06/07/2017 Oral Phase Impaired Oral - Pudding Teaspoon -- Oral - Pudding Cup -- Oral - Honey Teaspoon -- Oral - Honey Cup -- Oral - Nectar Teaspoon Left anterior bolus loss;Weak lingual manipulation;Incomplete tongue to palate contact;Reduced posterior propulsion;Decreased bolus cohesion;Delayed oral transit;Lingual pumping Oral - Nectar Cup Left anterior bolus loss;Weak lingual manipulation;Lingual pumping;Incomplete tongue to palate contact;Reduced posterior propulsion;Delayed oral transit;Decreased bolus cohesion Oral - Nectar Straw NT Oral - Thin Teaspoon Left anterior bolus loss;Weak lingual manipulation;Lingual pumping;Incomplete tongue to palate contact;Delayed oral transit;Reduced posterior propulsion;Decreased bolus cohesion;Holding of bolus Oral - Thin Cup Left  anterior bolus loss;Weak lingual manipulation;Lingual pumping;Incomplete tongue to palate contact;Reduced posterior propulsion;Delayed oral  transit;Decreased bolus cohesion;Holding of bolus Oral - Thin Straw NT Oral - Puree Left anterior bolus loss;Weak lingual manipulation;Lingual pumping;Reduced posterior propulsion;Holding of bolus;Delayed oral transit;Impaired mastication Oral - Mech Soft Left anterior bolus loss;Impaired mastication;Weak lingual manipulation;Lingual pumping;Reduced posterior propulsion;Holding of bolus;Delayed oral transit;Decreased bolus cohesion Oral - Regular -- Oral - Multi-Consistency -- Oral - Pill -- Oral Phase - Comment --  CHL IP PHARYNGEAL PHASE 06/07/2017 Pharyngeal Phase Impaired Pharyngeal- Pudding Teaspoon -- Pharyngeal -- Pharyngeal- Pudding Cup -- Pharyngeal -- Pharyngeal- Honey Teaspoon -- Pharyngeal -- Pharyngeal- Honey Cup -- Pharyngeal -- Pharyngeal- Nectar Teaspoon Delayed swallow initiation-vallecula;Pharyngeal residue - valleculae;Pharyngeal residue - pyriform;Pharyngeal residue - cp segment;Lateral channel residue Pharyngeal -- Pharyngeal- Nectar Cup Delayed swallow initiation-vallecula;Pharyngeal residue - pyriform;Pharyngeal residue - cp segment;Lateral channel residue;Pharyngeal residue - valleculae;Penetration/Aspiration during swallow Pharyngeal Material enters airway, remains ABOVE vocal cords then ejected out Pharyngeal- Nectar Straw NT Pharyngeal -- Pharyngeal- Thin Teaspoon Delayed swallow initiation-vallecula;Delayed swallow initiation-pyriform sinuses;Reduced airway/laryngeal closure;Penetration/Aspiration before swallow;Penetration/Aspiration during swallow;Pharyngeal residue - pyriform;Pharyngeal residue - cp segment;Pharyngeal residue - valleculae;Lateral channel residue Pharyngeal Material enters airway, remains ABOVE vocal cords then ejected out Pharyngeal- Thin Cup Delayed swallow initiation-vallecula;Delayed swallow initiation-pyriform sinuses;Reduced airway/laryngeal closure;Penetration/Aspiration before swallow;Penetration/Aspiration during swallow;Pharyngeal residue -  valleculae;Pharyngeal residue - pyriform;Pharyngeal residue - cp segment;Lateral channel residue Pharyngeal Material enters airway, remains ABOVE vocal cords and not ejected out;Material enters airway, remains ABOVE vocal cords then ejected out Pharyngeal- Thin Straw NT Pharyngeal -- Pharyngeal- Puree Delayed swallow initiation-vallecula Pharyngeal -- Pharyngeal- Mechanical Soft Delayed swallow initiation-vallecula;Pharyngeal residue - pyriform;Pharyngeal residue - cp segment Pharyngeal -- Pharyngeal- Regular -- Pharyngeal -- Pharyngeal- Multi-consistency -- Pharyngeal -- Pharyngeal- Pill -- Pharyngeal -- Pharyngeal Comment --  CHL IP CERVICAL ESOPHAGEAL PHASE 06/07/2017 Cervical Esophageal Phase Impaired Pudding Teaspoon -- Pudding Cup -- Honey Teaspoon -- Honey Cup -- Nectar Teaspoon -- Nectar Cup -- Nectar Straw -- Thin Teaspoon Esophageal backflow into cervical esophagus;Reduced cricopharyngeal relaxation Thin Cup Reduced cricopharyngeal relaxation;Esophageal backflow into cervical esophagus Thin Straw -- Puree -- Mechanical Soft -- Regular -- Multi-consistency -- Pill -- Cervical Esophageal Comment -- No flowsheet data found. Happi Overton 06/07/2017, 12:22 PM               Medications: I have reviewed the patient's current medications.  Assessment/Plan:  Right MCA infarct with left hemiparesis.  Continue CIR with PT OT and SLP Presently on subcutaneous Lovenox with plans to start Eliquis in 2 days  Hypertension.  Well controlled-Continue atenolol Atrial fibrillation.  Continue rate control and chronic anticoagulation Dyslipidemia continue simvastatin   Length of stay, days: [place] , MD 06/08/2017, 11:11 AM

## 2017-06-09 ENCOUNTER — Inpatient Hospital Stay (HOSPITAL_COMMUNITY): Payer: TRICARE For Life (TFL) | Admitting: Occupational Therapy

## 2017-06-09 NOTE — Progress Notes (Signed)
Physical Therapy Session Note  Patient Details  Name: Derek Hays MRN: [id number] Date of Birth: [birth year]  Today's Date: 06/08/2017 PT Individual Time:1105-1200   31mn   Short Term Goals: Week 1:  PT Short Term Goal 1 (Week 1): Pt will perform sit<>supine with max assist  PT Short Term Goal 1 - Progress (Week 1): Met PT Short Term Goal 2 (Week 1): Pt will maintain sitting balance x 1 min with mod assist  PT Short Term Goal 2 - Progress (Week 1): Met PT Short Term Goal 3 (Week 1): Pt will initiate WC mobility  PT Short Term Goal 3 - Progress (Week 1): Progressing toward goal PT Short Term Goal 4 (Week 1): [facility] 1  PT Short Term Goal 4 - Progress (Week 1): Met Week 2:  PT Short Term Goal 1 (Week 2): Pt will maintain sitting balance with supervision assist x 10 minutes.  PT Short Term Goal 2 (Week 2): Pt will propell WC 1068fwith min assist  PT Short Term Goal 3 (Week 2): [facility]  PT Short Term Goal 4 (Week 2): Pt will perform bed mobility with mod assist   Skilled Therapeutic Interventions/Progress Updates:   Pt received supine in bed and agreeable to PT. Supine>sit transfer with total assist due to increased lethargy on this day.    [facility]. SB transfer also completed to and from mat table with max assist of 1. Max cues for anterior weight shift and sequencing.   WC mobility with R hemi technique with min -mod assist x 7531fnd max cues for technique and coordination of UE and LE movement. 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   Sit<>stand transfer in [place] max assist + 2. Max cues for upright posture to prevent pushing tendencies.   Patient returned to room and [facility] and all needs met.        Therapy Documentation Precautions:  Precautions Precautions: Fall Precaution Comments:  neglect, severe L  hemiparesis Restrictions Weight Bearing Restrictions: No    Pain: Pain Assessment Pain Assessment: No/denies pain   See Function Navigator for Current Functional Status.   Therapy/Group: Individual Therapy  AusLorie Phenix24/2019, 12:39 AM

## 2017-06-09 NOTE — Progress Notes (Signed)
Patient ID: Derek Hays, male   DOB: [birth year], 84 y.o.   MRN: [id number]   Derek Hays is a 84 y.o. male admitted for CIR with functional deficits secondary to a right MCA infarct in the setting of a prior traumatic subdural hematoma in September of last year  Past Medical History:  Diagnosis Date  . Atrial fibrillation ([place])   . Hypertension      Subjective: No new complaints. No new problems.  Remains stable.  Objective: Vital signs in last 24 hours: Temp:  [97.6 F (36.4 C)-98.2 F (36.8 C)] 98.2 F (36.8 C) (02/24 0240) Pulse Rate:  [18-63] 18 (02/24 0240) Resp:  [18] 18 (02/24 0240) BP: (102-126)/(51-72) 126/51 (02/24 0240) SpO2:  [99 %-100 %] 99 % (02/24 0240) Weight change:  Last BM Date: 06/08/17  Intake/Output from previous day: 02/23 0701 - 02/24 0700 In: 600 [P.O.:600] Out: 700 [Urine:700] Last cbgs: CBG (last 3)  Recent Labs    06/07/17 2148  GLUCAP 119*     Physical Exam General: No apparent distress   HEENT: not dry Lungs: Normal effort. Lungs clear to auscultation, no crackles or wheezes. Cardiovascular: Regular rate and rhythm, no edema Abdomen: S/NT/ND; BS(+) Musculoskeletal:  unchanged Neurological: No new neurological deficits with dense left hemiparesis  Wounds: N/A    Skin: Ecchymoses involving the right upper inner right thigh Mental state: Alert, oriented, cooperative    Lab Results: BMET    Component Value Date/Time   NA 138 06/03/2017 0433   K 4.3 06/03/2017 0433   CL 105 06/03/2017 0433   CO2 24 06/03/2017 0433   GLUCOSE 107 (H) 06/03/2017 0433   BUN 22 (H) 06/03/2017 0433   CREATININE 1.31 (H) 06/03/2017 0433   CALCIUM 8.3 (L) 06/03/2017 0433   GFRNONAA 48 (L) 06/03/2017 0433   GFRAA 56 (L) 06/03/2017 0433   CBC    Component Value Date/Time   WBC 8.2 05/31/2017 0651   RBC 4.06 (L) 05/31/2017 0651   HGB 12.1 (L) 05/31/2017 0651   HCT 36.6 (L) 05/31/2017 0651   PLT 182 05/31/2017 0651   MCV 90.1 05/31/2017  0651   MCH 29.8 05/31/2017 0651   MCHC 33.1 05/31/2017 0651   RDW 13.3 05/31/2017 0651   LYMPHSABS 1.8 05/31/2017 0651   MONOABS 0.8 05/31/2017 0651   EOSABS 0.3 05/31/2017 0651   BASOSABS 0.0 05/31/2017 0651    Studies/Results: Dg Swallowing Func-speech Pathology  Result Date: 06/07/2017 Objective Swallowing Evaluation: Type of Study: MBS-Modified Barium Swallow Study  Patient Details Name: Derek Hays MRN: [id number] Date of Birth: 1930-09-29 Today's Date: 06/07/2017 Time: SLP Start Time (ACUTE ONLY): 1020 -SLP Stop Time (ACUTE ONLY): 1043 SLP Time Calculation (min) (ACUTE ONLY): 23 min Past Medical History: Past Medical History: Diagnosis Date . Atrial fibrillation ([place])  . Hypertension  Past Surgical History: Past Surgical History: Procedure Laterality Date . IR CT HEAD LTD  05/27/2017 . IR PERCUTANEOUS ART THROMBECTOMY/INFUSION INTRACRANIAL INC DIAG ANGIO  05/27/2017 . RADIOLOGY WITH ANESTHESIA N/A 05/27/2017  Procedure: RADIOLOGY WITH ANESTHESIA;  Surgeon: Luanne Bras, MD;  Location: [facility];  Service: Radiology;  Laterality: N/A; HPI: Derek Hays a 83 y.o.malewith history of A. fibs/ppacemaker not on AC due to recent?? SDH, hypertensionadmitted foracute onset left-sided weaknessand right gaze. No tPA given due toleftPCA in the right MCA hypodensity on CT.Status post endovascular treatment. MRI shows Acute right MCA infarct involving the right insula and frontal parietal lobe. Small acute or subacute infarct left parietal lobe. Subacute  infarct left occipital lobe. Findings suggest emboli.  No Data Recorded Assessment / Plan / Recommendation CHL IP CLINICAL IMPRESSIONS 06/07/2017 Clinical Impression Pt presents with moderate oral phase dysphagia c/b significant difficulty management liquid and food boluses, difficulty with AP transfer of of all boluses and anterior spillage on left. Pt with moderate sensory motor pharyngeal impairments c/b delayed swallow intiation to  vallecula with nectar thick liquids and as bolus transitions from vallecula to pyriform sinus with boluses of thin liquids. When consuming thin liquids by spoon, pt with penetration during swallow and when consuming by cup sips pt with consistent deep penetration before and during swallow. This deep penetration with liquids leads to accumulating residue on underside of epiglottis extending down into laryngeal vestibule. Pt's strength deficits are also c/b widespread pharyngeal residue throughout study. Pt with no sensation of penetration or residue. Pt's swallow is further impacted by bolus difficulty passing thru UES. Thiin liquids boluses had difficulty passing thru UES and during closure of UES, some of thin liquid bolus was squeezed back into pyriform sinuses with increased residue remaining in pyriform sinuses. Although pt didn't aspirate any liquids or textures, pt presents with overall decreased effecieny when swallowing. With thin liquids, pt presents with increased work of breath and reports difficulty with breathing after thin liquids bolus. Pt's efficiencyis further impacted by overall significant faitgue, endurance, awareness, decreased ambulatory abilities and decreased cognitive abilities. Therefore I am recommending pt remain on most conservative diet of dysphagia 1 with  nectar thick liquids, medicine whole with puree and full supervision during meals. I would recommend possibly initiating water procotol as pt's endurance and cognitive status improves SLP Visit Diagnosis Dysphagia, oropharyngeal phase (R13.12) Attention and concentration deficit following -- Frontal lobe and executive function deficit following -- Impact on safety and function Moderate aspiration risk   CHL IP TREATMENT RECOMMENDATION 06/07/2017 Treatment Recommendations Therapy as outlined in treatment plan below   Prognosis 05/29/2017 Prognosis for Safe Diet Advancement Good Barriers to Reach Goals Cognitive deficits  Barriers/Prognosis Comment -- CHL IP DIET RECOMMENDATION 06/07/2017 SLP Diet Recommendations Dysphagia 1 (Puree) solids;Nectar thick liquid Liquid Administration via Straw Medication Administration Whole meds with puree Compensations Slow rate;Small sips/bites;Lingual sweep for clearance of pocketing;Monitor for anterior loss;Clear throat intermittently Postural Changes Remain semi-upright after after feeds/meals (Comment);Seated upright at 90 degrees   CHL IP OTHER RECOMMENDATIONS 06/07/2017 Recommended Consults -- Oral Care Recommendations Oral care BID Other Recommendations Have oral suction available   CHL IP FOLLOW UP RECOMMENDATIONS 05/29/2017 Follow up Recommendations Inpatient Rehab   CHL IP FREQUENCY AND DURATION 05/29/2017 Speech Therapy Frequency (ACUTE ONLY) min 2x/week Treatment Duration 2 weeks      CHL IP ORAL PHASE 06/07/2017 Oral Phase Impaired Oral - Pudding Teaspoon -- Oral - Pudding Cup -- Oral - Honey Teaspoon -- Oral - Honey Cup -- Oral - Nectar Teaspoon Left anterior bolus loss;Weak lingual manipulation;Incomplete tongue to palate contact;Reduced posterior propulsion;Decreased bolus cohesion;Delayed oral transit;Lingual pumping Oral - Nectar Cup Left anterior bolus loss;Weak lingual manipulation;Lingual pumping;Incomplete tongue to palate contact;Reduced posterior propulsion;Delayed oral transit;Decreased bolus cohesion Oral - Nectar Straw NT Oral - Thin Teaspoon Left anterior bolus loss;Weak lingual manipulation;Lingual pumping;Incomplete tongue to palate contact;Delayed oral transit;Reduced posterior propulsion;Decreased bolus cohesion;Holding of bolus Oral - Thin Cup Left anterior bolus loss;Weak lingual manipulation;Lingual pumping;Incomplete tongue to palate contact;Reduced posterior propulsion;Delayed oral transit;Decreased bolus cohesion;Holding of bolus Oral - Thin Straw NT Oral - Puree Left anterior bolus loss;Weak lingual manipulation;Lingual pumping;Reduced posterior propulsion;Holding  of bolus;Delayed oral transit;Impaired mastication Oral -  Mech Soft Left anterior bolus loss;Impaired mastication;Weak lingual manipulation;Lingual pumping;Reduced posterior propulsion;Holding of bolus;Delayed oral transit;Decreased bolus cohesion Oral - Regular -- Oral - Multi-Consistency -- Oral - Pill -- Oral Phase - Comment --  CHL IP PHARYNGEAL PHASE 06/07/2017 Pharyngeal Phase Impaired Pharyngeal- Pudding Teaspoon -- Pharyngeal -- Pharyngeal- Pudding Cup -- Pharyngeal -- Pharyngeal- Honey Teaspoon -- Pharyngeal -- Pharyngeal- Honey Cup -- Pharyngeal -- Pharyngeal- Nectar Teaspoon Delayed swallow initiation-vallecula;Pharyngeal residue - valleculae;Pharyngeal residue - pyriform;Pharyngeal residue - cp segment;Lateral channel residue Pharyngeal -- Pharyngeal- Nectar Cup Delayed swallow initiation-vallecula;Pharyngeal residue - pyriform;Pharyngeal residue - cp segment;Lateral channel residue;Pharyngeal residue - valleculae;Penetration/Aspiration during swallow Pharyngeal Material enters airway, remains ABOVE vocal cords then ejected out Pharyngeal- Nectar Straw NT Pharyngeal -- Pharyngeal- Thin Teaspoon Delayed swallow initiation-vallecula;Delayed swallow initiation-pyriform sinuses;Reduced airway/laryngeal closure;Penetration/Aspiration before swallow;Penetration/Aspiration during swallow;Pharyngeal residue - pyriform;Pharyngeal residue - cp segment;Pharyngeal residue - valleculae;Lateral channel residue Pharyngeal Material enters airway, remains ABOVE vocal cords then ejected out Pharyngeal- Thin Cup Delayed swallow initiation-vallecula;Delayed swallow initiation-pyriform sinuses;Reduced airway/laryngeal closure;Penetration/Aspiration before swallow;Penetration/Aspiration during swallow;Pharyngeal residue - valleculae;Pharyngeal residue - pyriform;Pharyngeal residue - cp segment;Lateral channel residue Pharyngeal Material enters airway, remains ABOVE vocal cords and not ejected out;Material enters airway,  remains ABOVE vocal cords then ejected out Pharyngeal- Thin Straw NT Pharyngeal -- Pharyngeal- Puree Delayed swallow initiation-vallecula Pharyngeal -- Pharyngeal- Mechanical Soft Delayed swallow initiation-vallecula;Pharyngeal residue - pyriform;Pharyngeal residue - cp segment Pharyngeal -- Pharyngeal- Regular -- Pharyngeal -- Pharyngeal- Multi-consistency -- Pharyngeal -- Pharyngeal- Pill -- Pharyngeal -- Pharyngeal Comment --  CHL IP CERVICAL ESOPHAGEAL PHASE 06/07/2017 Cervical Esophageal Phase Impaired Pudding Teaspoon -- Pudding Cup -- Honey Teaspoon -- Honey Cup -- Nectar Teaspoon -- Nectar Cup -- Nectar Straw -- Thin Teaspoon Esophageal backflow into cervical esophagus;Reduced cricopharyngeal relaxation Thin Cup Reduced cricopharyngeal relaxation;Esophageal backflow into cervical esophagus Thin Straw -- Puree -- Mechanical Soft -- Regular -- Multi-consistency -- Pill -- Cervical Esophageal Comment -- No flowsheet data found. Derek Hays 06/07/2017, 12:22 PM               Medications: I have reviewed the patient's current medications.  Assessment/Plan:  Functional deficits secondary to right MCA infarct with left hemiparesis.  We will continue CIR with PT OT and SLP.  Remains on Lovenox.  Anticipate starting Eliquis tomorrow Essential hypertension.  Continue atenolol remains well controlled Atrial fibrillation.  Resume Eliquis anticoagulation tomorrow Dyslipidemia continue statin therapy    Length of stay, days: [place] , MD 06/09/2017, 8:45 AM

## 2017-06-09 NOTE — Plan of Care (Signed)
  Not Progressing RH BOWEL ELIMINATION RH STG MANAGE BOWEL WITH ASSISTANCE Description STG Manage Bowel with Mod Assistance.    Total assist-incontinent 06/09/2017 0404 - Not Progressing by Cornell Barman, RN RH STG MANAGE BOWEL W/MEDICATION W/ASSISTANCE Description STG Manage Bowel with Medication with Max Assistance.   06/09/2017 0404 - Not Progressing by Cornell Barman, RN RH BLADDER ELIMINATION RH STG MANAGE BLADDER WITH ASSISTANCE Description STG Manage Bladder With  Max Assistance  I&O cath-total assist 06/09/2017 0404 - Not Progressing by Cornell Barman, RN RH STG MANAGE BLADDER WITH MEDICATION WITH ASSISTANCE Description STG Manage Bladder With Medication With min Assistance.  06/09/2017 0404 - Not Progressing by Cornell Barman, RN RH STG MANAGE BLADDER WITH EQUIPMENT WITH ASSISTANCE Description STG Manage Bladder With Equipment With total Assistance  06/09/2017 0404 - Not Progressing by Cornell Barman, RN

## 2017-06-09 NOTE — Progress Notes (Signed)
At 2400, bladder scan=180cc's. At 0230 no void, I & O cath=300cc's. Derek Hays A

## 2017-06-09 NOTE — Progress Notes (Signed)
Occupational Therapy Session Note  Patient Details  Name: Derek Hays MRN: [id number] Date of Birth: 07-22-1930  Today's Date: 06/09/2017 OT Individual Time: 7342-8768 OT Individual Time Calculation (min): 59 min    Short Term Goals: Week 2:  OT Short Term Goal 1 (Week 2): Pt will transfer with max A +1 in order to reduce caregiver burden. OT Short Term Goal 2 (Week 2): Pt will maintain static sitting balance on BSC without suport of mechanical lift with min A OT Short Term Goal 3 (Week 2): Pt will roll to R with mod In during bed level ADL  Skilled Therapeutic Interventions/Progress Updates:    Pt greeted supine in bed with no c/o pain. Tx focus on Lt NMR, Lt attention, sitting balance, and activity tolerance via bathing/dressing tasks. He transitioned EOB with 2 helpers, able to slide R LE off of bed himself. Pt maintaining balance with Mod A 90% of time with L UE weightbearing facilitated. He initiated bathing of L UE with Rt, and used Lt with HOH for washing thigh. Pt positioned in figure 4 to assist with washing L LE and applying lotion. No c/o pain when L LE was positioned in figure 4 afterwards. UB/LB dressing completed EOB with pt able to thread R UE into shirt with supervision and instruction on hemi techniques. Pt unable to maintain balance to thread pants EOB without assist. Pt then reported feeling that brief was soiled. Transitioned back to supine for perihygiene completion/brief change with 2 helper assist. Instructed pt on bridging technique with manual facilitation of L LE placement with pt able to elevate hips enough after 5 trials to pull pants up completely. He assisted with boosting himself up in bed by pulling up with R UE using bedrail. Positioned him in sidelying for pressure relief utilizing hemi positioning strategies. He was left with all needs within reach and bed alarm set.    Therapy Documentation Precautions:  Precautions Precautions: Fall Precaution Comments:   neglect, severe L hemiparesis Restrictions Weight Bearing Restrictions: No  Pain: No c/o pain during session    ADL:      See Function Navigator for Current Functional Status.   Therapy/Group: Individual Therapy  Merica Prell A Juron Vorhees 06/09/2017, 5:25 PM

## 2017-06-10 ENCOUNTER — Inpatient Hospital Stay (HOSPITAL_COMMUNITY): Payer: TRICARE For Life (TFL) | Admitting: Speech Pathology

## 2017-06-10 ENCOUNTER — Inpatient Hospital Stay (HOSPITAL_COMMUNITY): Payer: TRICARE For Life (TFL) | Admitting: Physical Therapy

## 2017-06-10 ENCOUNTER — Inpatient Hospital Stay (HOSPITAL_COMMUNITY): Payer: Medicare Other | Admitting: Speech Pathology

## 2017-06-10 ENCOUNTER — Inpatient Hospital Stay (HOSPITAL_COMMUNITY): Payer: TRICARE For Life (TFL)

## 2017-06-10 ENCOUNTER — Inpatient Hospital Stay (HOSPITAL_COMMUNITY): Payer: TRICARE For Life (TFL) | Admitting: Occupational Therapy

## 2017-06-10 MED ORDER — APIXABAN 5 MG PO TABS
5.0000 mg | ORAL_TABLET | Freq: Two times a day (BID) | ORAL | Status: DC
  Administered 2017-06-10 – 2017-06-20 (×21): 5 mg via ORAL
  Filled 2017-06-10 (×21): qty 1

## 2017-06-10 NOTE — Progress Notes (Signed)
ANTICOAGULATION CONSULT NOTE - Follow Up Consult  Pharmacy Consult for Eliquis Indication: stroke  No Known Allergies  Patient Measurements: Height: 6' (182.9 cm) Weight: 221 lb 9 oz (100.5 kg) IBW/kg (Calculated) : 77.6  Vital Signs: Temp: 98.3 F (36.8 C) (02/25 0400) Temp Source: Oral (02/25 0400) BP: 136/69 (02/25 0941) Pulse Rate: 59 (02/25 0941)   Assessment: 84 year old male to begin Eliquis today for Afib now s/p stroke  Goal of Therapy:   Monitor platelets by anticoagulation protocol: Yes   Plan:  Begin Eliquis 5 mg po BID  Thank you Anette Guarneri, PharmD [phone] 06/10/2017,2:58 PM

## 2017-06-10 NOTE — Progress Notes (Signed)
Occupational Therapy Session Note  Patient Details  Name: Derek Hays MRN: [id number] Date of Birth: 03-18-1931  Today's Date: 06/10/2017 OT Individual Time: 1000-1100 OT Individual Time Calculation (min): 60 min    Short Term Goals: Week 2:  OT Short Term Goal 1 (Week 2): Pt will transfer with max A +1 in order to reduce caregiver burden. OT Short Term Goal 2 (Week 2): Pt will maintain static sitting balance on BSC without suport of mechanical lift with min A OT Short Term Goal 3 (Week 2): Pt will roll to R with mod In during bed level ADL  Skilled Therapeutic Interventions/Progress Updates:    Pt seen for OT session focusing on ADL re-training and functional sitting balance during neuro re-ed task. Pt received sitting up in tilt-in-space w/c upon arrival, agreeable to tx session. Pt able to recall hemi-dressing technique independently though required assist to follow through with VCs for attention to L UE. Once L UE threaded, pt able to complete additional three steps with min A. Pt able to assist with threading R LE into pants with steadying assist for anterior weight shift, socks/shoes donned total A. He stood in [place] with +2 assist in order for pants to be pulled up. Pt unable to achieve full erect posture in standing. In therapy gym, pt completed sliding board transfer to mat with +1 physical assist and +2 to assist with stabilizing equipment. He completed functional reaching task seated EOB, R UE placed in weightbearing position and hand on textured mat for increased proprioception. Pt able to maintain static and dynamic sitting balance with overall supervision, mirror placed for visual feedback and pt able to self correct when cued to look in mirror.  Pt required VCs for initiation following each reaching repetition. He was able to correctly match 12/12 playing cards on sticky board placed on L. Pt left seated EOM at end of session with hand off to PT.  Therapy  Documentation Precautions:  Precautions Precautions: Fall Precaution Comments:  neglect, severe L hemiparesis Restrictions Weight Bearing Restrictions: No Pain:   Voiced some buttock discomfort, unable to describe, pressure relief, distraction, and redirection provided.   See Function Navigator for Current Functional Status.   Therapy/Group: Individual Therapy  Akram Kissick L 06/10/2017, 7:00 AM

## 2017-06-10 NOTE — Progress Notes (Signed)
Physical Therapy Session Note  Patient Details  Name: Derek Hays MRN: [id number] Date of Birth: 14-Nov-1930  Today's Date: 06/10/2017 PT Individual Time: 1105-1200 PT Individual Time Calculation (min): 55 min   Short Term Goals: Week 2:  PT Short Term Goal 1 (Week 2): Pt will maintain sitting balance with supervision assist x 10 minutes.  PT Short Term Goal 2 (Week 2): Pt will propell WC 127f with min assist  PT Short Term Goal 3 (Week 2): [facility]  PT Short Term Goal 4 (Week 2): Pt will perform bed mobility with mod assist   Skilled Therapeutic Interventions/Progress Updates: Pt presented sitting at EOM hand off from OT. Pt continued task of reaching for cups crossing midline to L with RUE for L trunk activation. Pt able to reach with minA and return to midline with only verbal cues. Pt indicated increased discomfort at rectum area "feels like it's blocked", but agreeable to continue. Pt performed sit to/from stand from elevated mat x 3 with modA x2 fading to modA x 1. Pt required max verbal cues and visual feedback for midline orientation. Pt able to achieve midline however unable to maintain for more than 5 seconds. Pt continued to c/o abdominal buttock discomfort. Performed additional sit to stand with St[place] (+2 assist) and [facility]. Pt transported to day room and performed Cybex Kinetron 90cm/sec 10 cycles x2 with pt noted to have improved hip flexion and HS activation during second cycle. PTA discussed with nsg pt's complaints, nsg request to return pt to bed for in/out cath and suppository. Pt transported to room and preformed SB transfer to R with modA x 2. Pt required modA x1 sit to supine with HOB elevated and assist for BLE placement. Pt performed rolling to R maxA and L modA with verbal cues for hand placement and use of bed rails for removal of pants. Pt left with needs met, call bell within reach and bed alarm set.       Therapy Documentation Precautions:  Precautions Precautions: Fall Precaution Comments:  neglect, severe L hemiparesis Restrictions Weight Bearing Restrictions: No General:   Vital Signs: Therapy Vitals Temp: 98.2 F (36.8 C) Temp Source: Oral Pulse Rate: 62 Resp: 18 BP: 126/72 Patient Position (if appropriate): Lying Oxygen Therapy SpO2: 98 % O2 Device: Not Delivered  See Function Navigator for Current Functional Status.   Therapy/Group: Individual Therapy  Shannen Vernon  Hanif Radin, PTA  06/10/2017, 4:17 PM

## 2017-06-10 NOTE — Progress Notes (Signed)
Speech Language Pathology Daily Session Note  Patient Details  Name: Derek Hays MRN: [id number] Date of Birth: 05/17/1930  Today's Date: 06/10/2017 SLP Individual Time: 1443-1540 SLP Individual Time Calculation (min): 75 min  Short Term Goals: Week 2: SLP Short Term Goal 1 (Week 2): Pt will consume dysphagia 1 with nectar thick liquids with minimal overt s/s of aspiration and Min A cues for use of compensatory swallow strategies.  SLP Short Term Goal 2 (Week 2): Pt will consume trials of thin liquids with minimal overt s/s of aspiration with Mod A cues for compensatory swallow strategies.  SLP Short Term Goal 3 (Week 2): Pt will demonstrate intellectual awareness by answering yes/no questions regarding physical and swallowing deficits with Mod A cues.  SLP Short Term Goal 4 (Week 2): Pt will scan to midline to locate objects in 50% of opportunities and Max A cues.  SLP Short Term Goal 5 (Week 2): Pt will complete basic familiar tasks with Min A cues.  Skilled Therapeutic Interventions: Skilled treatment session focused on dysphagia and cognition goals. SLP facilitated session by providing assist with transferring pt to wheelchair (NT and lift). Pt required Max A cues to follow 1 step directions for bed mobility to place lift pad etc. Pt with immediate recall of SLP and recent swallow study. Pt had independent appropriate questions and was able to state that his swallow was impacted as direct result of CVA. SLP further facilitated session by providing skilled observation of pt consuming dysphagia 1 breakfast with nectar thick liquids via cup sips. Pt with minimal overt s/s of aspiration and delayed cough x 2 throughout consumption. However nose was not running prior to consumption but ran clear throughout consumption. Pt required Max A to initiate task of self-feeding for every bolus when all items were positioned to pt's right (well within his functional sight). Pt with increased oral prep and  min oral residue that was functional and didn't accumulate but was actively manipulated either. Pt with high risk of aspiration given increased oral phase, decreased task initiation and decreased attention to task. Pt will likely always need to be fed which also places him at increased risk of aspiration. Pt was returned to room, left titled and upright in wheelchair with safety belt donned and all needs within reach. Continue per current plan of care.      Function:  Eating Eating   Modified Consistency Diet: Yes Eating Assist Level: Set up assist for;Supervision or verbal cues;More than reasonable amount of time   Eating Set Up Assist For: Opening containers Helper Scoops Food on Utensil: Every scoop     Cognition Comprehension Comprehension assist level: Understands basic 75 - 89% of the time/ requires cueing 10 - 24% of the time;Understands basic 50 - 74% of the time/ requires cueing 25 - 49% of the time  Expression   Expression assist level: Expresses basic 50 - 74% of the time/requires cueing 25 - 49% of the time. Needs to repeat parts of sentences.  Social Interaction Social Interaction assist level: Interacts appropriately 90% of the time - Needs monitoring or encouragement for participation or interaction.  Problem Solving Problem solving assist level: Solves basic 50 - 74% of the time/requires cueing 25 - 49% of the time  Memory Memory assist level: Recognizes or recalls 75 - 89% of the time/requires cueing 10 - 24% of the time    Pain    Therapy/Group: Individual Therapy  Treyce Spillers 06/10/2017, 12:16 PM

## 2017-06-10 NOTE — Progress Notes (Signed)
Subjective/Complaints: Does not remember me but does have orientation to person place time and situation  ROS: pt denies nausea, vomiting, diarrhea, cough, shortness of breath or chest pain   Objective: Vital Signs: Blood pressure 115/68, pulse 61, temperature 98.3 F (36.8 C), temperature source Oral, resp. rate 17, height 6' (1.829 m), weight 100.5 kg (221 lb 9 oz), SpO2 98 %. No results found. Results for orders placed or performed during the hospital encounter of 05/30/17 (from the past 72 hour(s))  Glucose, capillary     Status: Abnormal   Collection Time: 06/07/17  9:48 PM  Result Value Ref Range   Glucose-Capillary 119 (H) 65 - 99 mg/dL     HEENT: Exhibits pocketing during feeding Cardio: RRR without murmur. No JVD  Resp: CTA Bilaterally without wheezes or rales. Normal effort   GI: BS positive and Nontender and non-distended Extremity:  Edema Dorsum of left hand and wrist Skin:   Other Multiple ecchymoses left upper extremity Neuro: Alert/Oriented, Abnormal Sensory Decreased sensation left upper and left lower limb, Abnormal Motor tr/5 left upper extremity biceps, 2- hip knee extensor synergy in the left lower extremity otherwise 0.  Right side is normal, Tone:  Hypotonia, Dysarthric and Inattention--- no changes Musc/Skel:  Other No pain with upper or lower limb range of motion General no acute distress   Assessment/Plan: 1. 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 Physiatrist is providing close team supervision and 24 hour management of active medical problems listed below. Physiatrist and rehab team continue to assess barriers to discharge/monitor patient progress toward functional and medical goals. FIM: Function - Bathing Position: Other (comment)(EOB UB, bedlevel  LB) Body parts bathed by patient: Abdomen, Chest, Right upper leg Body parts bathed by helper: Right arm, Left arm, Front perineal area, Buttocks, Left upper leg, Right lower leg, Left lower leg, Back Assist Level: 2 helpers  Function- Upper Body Dressing/Undressing What is the patient wearing?: Pull over shirt/dress Pull over shirt/dress - Perfomed by patient: Thread/unthread right sleeve Pull over shirt/dress - Perfomed by helper: Thread/unthread left sleeve, Put head through opening, Pull shirt over trunk Assist Level: (Total A) Function - Lower Body Dressing/Undressing What is the patient wearing?: Pants, Non-skid slipper socks Position: Other (comment)(thread bilateral LEs into pants EOB, elevated over hips bedlevel via bridging) Pants- Performed by helper: Thread/unthread right pants leg, Thread/unthread left pants leg, Pull pants up/down Non-skid slipper socks- Performed by helper: Don/doff right sock, Don/doff left sock Socks - Performed by helper: Don/doff right sock, Don/doff left sock Shoes - Performed by helper: Don/doff right shoe, Don/doff left shoe, Fasten right, Fasten left Assist for footwear: Dependant Assist for lower body dressing: 2 Helpers  Function - Toileting Toileting activity did not occur: No continent bowel/bladder event Toileting steps completed by helper: Adjust clothing prior to toileting, Performs perineal hygiene, Adjust clothing after toileting Assist level: Two helpers  Function - Air cabin crew transfer activity did not occur: (not attempted) Toilet transfer assistive device: Bedside commode, Mechanical lift Mechanical lift: Maximove Assist level to bedside commode (at bedside): 2 helpers Assist level from bedside commode (at bedside): 2 helpers  Function - Chair/bed transfer Chair/bed transfer method: Lateral scoot Chair/bed transfer assist level: Maximal assist (Pt 25 - 49%/lift and lower) Chair/bed transfer assistive device: Armrests,  Sliding board  Function - Locomotion: Wheelchair Will patient use wheelchair  at discharge?: Yes Type: Manual(tilt in space wc) Wheelchair activity did not occur: Safety/medical concerns Assist Level: Dependent (Pt equals 0%)(for transport) Wheel 50 feet with 2 turns activity did not occur: Safety/medical concerns Wheel 150 feet activity did not occur: Safety/medical concerns Function - Locomotion: Ambulation Ambulation activity did not occur: Safety/medical concerns Walk 10 feet activity did not occur: Safety/medical concerns Walk 50 feet with 2 turns activity did not occur: Safety/medical concerns Walk 150 feet activity did not occur: Safety/medical concerns Walk 10 feet on uneven surfaces activity did not occur: Safety/medical concerns  Function - Comprehension Comprehension: Auditory Comprehension assistive device: Hearing aids Comprehension assist level: Understands basic 75 - 89% of the time/ requires cueing 10 - 24% of the time, Understands basic 50 - 74% of the time/ requires cueing 25 - 49% of the time  Function - Expression Expression: Verbal Expression assist level: Expresses basic 75 - 89% of the time/requires cueing 10 - 24% of the time. Needs helper to occlude trach/needs to repeat words., Expresses basic 50 - 74% of the time/requires cueing 25 - 49% of the time. Needs to repeat parts of sentences.  Function - Social Interaction Social Interaction assist level: Interacts appropriately 90% of the time - Needs monitoring or encouragement for participation or interaction.  Function - Problem Solving Problem solving assist level: Solves basic 50 - 74% of the time/requires cueing 25 - 49% of the time  Function - Memory Memory assist level: Recognizes or recalls 75 - 89% of the time/requires cueing 10 - 24% of the time Patient normally able to recall (first 3 days only): That he or she is in a hospital, Current season  Medical Problem List and Plan: 1.Left hemiparesis,  left neglect, left hemisensory deficitssecondary to right MCA infarction/distal M1 segment occlusion status post revascularizationas well as history of traumatic SDH September 2019 with follow-up CT the head 05/10/2017 showed SDH completely reabsorbed.  CIR PT, OT, SLP ,  2. DVT Prophylaxis/Anticoagulation: Subcutaneous Lovenox. Monitor platelet counts in any signs of bleeding. beginELIQUIS  06/10/2017 5 mg twice a day due to size of infarction. No need to reimage. Discontinue Lovenox and aspirin onceELIQUISresumed 3. Pain Management:Lyrica 100 mg twice a day, Tylenol as needed, no neuropathic pain c/os 4. Mood:Provide emotional support 5. Neuropsych: This patientiscapable of making decisions on hisown behalf. 6. Skin/Wound Care:Routine skin checks 7. Fluids/Electrolytes/Nutrition:Routine I&O's low intake may need IVF but BMET shows stable BUN and Creat 8.Dysphagia. Dysphagia #1 nectar liquids.  -continue to follow for tolerance    Recheck labs Normal 9.Hypertension. Tenormin 50 mg daily.  Fair control at present Vitals:   06/09/17 1423 06/10/17 0400  BP: 121/62 115/68  Pulse: (!) 58 61  Resp: 18 17  Temp: 98.4 F (36.9 C) 98.3 F (36.8 C)  SpO2: 100% 98%  Controlled 2/25 10.Atrial fibrillation with history of pacemaker. Cardiac rate controlled. Patient to follow-up with Dr.Fargencardiology services [facility]. 11.Hypothyroidism. Synthroid 12.Hyperlipidemia. Zocor 13.  Constipation: lax/soft + Dulcolax suppository today if needed.   LOS (Days) [place] PERFORMED  Charlett Blake 06/10/2017, 8:27 AM

## 2017-06-11 ENCOUNTER — Inpatient Hospital Stay (HOSPITAL_COMMUNITY): Payer: TRICARE For Life (TFL)

## 2017-06-11 ENCOUNTER — Inpatient Hospital Stay (HOSPITAL_COMMUNITY): Payer: TRICARE For Life (TFL) | Admitting: Occupational Therapy

## 2017-06-11 NOTE — Progress Notes (Signed)
Orthopedic Tech Progress Note Patient Details:  Derek Hays [birth year] [id number]  Ortho Devices Type of Ortho Device: Arm sling Ortho Device/Splint Location: lue Ortho Device/Splint Interventions: Application   Post Interventions Patient Tolerated: Well Instructions Provided: Care of device   [name] 06/11/2017, 1:01 PM

## 2017-06-11 NOTE — Progress Notes (Signed)
Subjective/Complaints: Remebers my name this am , no c/o  ROS: pt denies nausea, vomiting, diarrhea, cough, shortness of breath or chest pain   Objective: Vital Signs: Blood pressure 117/70, pulse 60, temperature 98 F (36.7 C), temperature source Oral, resp. rate 17, height 6' (1.829 m), weight 100.5 kg (221 lb 9 oz), SpO2 97 %. No results found. No results found for this or any previous visit (from the past 72 hour(s)).   HEENT: Exhibits pocketing during feeding Cardio: RRR without murmur. No JVD  Resp: CTA Bilaterally without wheezes or rales. Normal effort   GI: BS positive and Nontender and non-distended Extremity:  Edema Dorsum of left hand and wrist Skin:   Other Multiple ecchymoses left upper extremity Neuro: Alert/Oriented, Abnormal Sensory Decreased sensation left upper and left lower limb, Abnormal Motor tr/5 left upper extremity biceps, 2- hip knee extensor synergy in the left lower extremity otherwise 0.  Right side is normal, Tone:  Hypotonia, Dysarthric and Inattention--- no changes Musc/Skel:  Other No pain with upper or lower limb range of motion General no acute distress   Assessment/Plan: 1. 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 Physiatrist is providing close team supervision and 24 hour management of active medical problems listed below. Physiatrist and rehab team continue to assess barriers to discharge/monitor patient progress toward functional and medical goals. FIM: Function - Bathing Position: Other (comment)(EOB UB, bedlevel LB) Body parts bathed by patient: Abdomen, Chest, Right upper leg Body parts bathed by helper: Right arm, Left arm, Front perineal area, Buttocks, Left upper leg, Right lower leg, Left lower leg, Back Assist Level: 2  helpers  Function- Upper Body Dressing/Undressing What is the patient wearing?: Pull over shirt/dress Pull over shirt/dress - Perfomed by patient: Thread/unthread right sleeve, Put head through opening, Pull shirt over trunk Pull over shirt/dress - Perfomed by helper: Thread/unthread left sleeve Assist Level: Touching or steadying assistance(Pt > 75%) Function - Lower Body Dressing/Undressing What is the patient wearing?: Pants, Socks, Shoes Position: Wheelchair/chair at sink Pants- Performed by patient: Thread/unthread right pants leg Pants- Performed by helper: Thread/unthread left pants leg, Pull pants up/down Non-skid slipper socks- Performed by helper: Don/doff right sock, Don/doff left sock Socks - Performed by helper: Don/doff right sock, Don/doff left sock Shoes - Performed by helper: Don/doff right shoe, Don/doff left shoe, Fasten right, Fasten left Assist for footwear: Dependant Assist for lower body dressing: 2 Helpers(Standing in STEDY)  Function - Toileting Toileting activity did not occur: No continent bowel/bladder event Toileting steps completed by helper: Adjust clothing prior to toileting, Performs perineal hygiene, Adjust clothing after toileting Assist level: Two helpers  Function - Air cabin crew transfer activity did not occur: (not attempted) Toilet transfer assistive device: Bedside commode, Mechanical lift Mechanical lift: Maximove Assist level to bedside commode (at bedside): 2 helpers Assist level from bedside commode (at bedside): 2 helpers  Function - Chair/bed transfer Chair/bed transfer method: Lateral scoot Chair/bed transfer assist level: Maximal assist (Pt 25 - 49%/lift and lower) Chair/bed transfer assistive device: Armrests, Sliding board  Function - Locomotion: Wheelchair Will patient use wheelchair at discharge?: Yes Type: Manual(tilt in space wc) Wheelchair activity did not occur: Safety/medical concerns Assist Level: Dependent  (Pt equals 0%)(for transport) Wheel 50 feet with 2 turns activity did not occur: Safety/medical concerns Wheel 150 feet activity did not occur: Safety/medical concerns  Function - Locomotion: Ambulation Ambulation activity did not occur: Safety/medical concerns Walk 10 feet activity did not occur: Safety/medical concerns Walk 50 feet with 2 turns activity did not occur: Safety/medical concerns Walk 150 feet activity did not occur: Safety/medical concerns Walk 10 feet on uneven surfaces activity did not occur: Safety/medical concerns  Function - Comprehension Comprehension: Auditory Comprehension assistive device: Hearing aids Comprehension assist level: Understands basic 75 - 89% of the time/ requires cueing 10 - 24% of the time, Understands basic 50 - 74% of the time/ requires cueing 25 - 49% of the time  Function - Expression Expression: Verbal Expression assist level: Expresses basic 50 - 74% of the time/requires cueing 25 - 49% of the time. Needs to repeat parts of sentences.  Function - Social Interaction Social Interaction assist level: Interacts appropriately 90% of the time - Needs monitoring or encouragement for participation or interaction.  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  CIR PT, OT, SLP team conf in am,  2. DVT Prophylaxis/Anticoagulation:  Subcutaneous Lovenox. Monitor platelet counts in any signs of bleeding.StartedELIQUIS  06/10/2017 5 mg twice a day due to size of infarction. No need to reimage. Discontinue Lovenox and aspirin onceELIQUISresumed 3. Pain Management:Lyrica 100 mg twice a day, Tylenol as needed, no neuropathic pain c/os 4. Mood:Provide emotional support 5. Neuropsych: This patientiscapable of making decisions on hisown behalf. 6. Skin/Wound Care:Routine skin checks 7. Fluids/Electrolytes/Nutrition:Routine I&O's low intake may need IVF but BMET shows stable BUN and Creat 8.Dysphagia. Dysphagia #1 nectar liquids.  -continue to follow for tolerance, I: 4106ml on 2/25    9.Hypertension. Tenormin 50 mg daily.  Fair control at present Vitals:   06/11/17 0441 06/11/17 0802  BP: 128/72 117/70  Pulse: 70 60  Resp: 17   Temp: 98 F (36.7 C)   SpO2: 97% 97%  Controlled 2/26 10.Atrial fibrillation with history of pacemaker. Cardiac rate controlled. Patient to follow-up with Dr.Fargencardiology services [facility]. 11.Hypothyroidism. Synthroid 12.Hyperlipidemia. Zocor 13.  Constipation: lax/soft + Dulcolax suppository today if needed.   LOS (Days) 12 A FACE TO FACE EVALUATION WAS PERFORMED  Charlett Blake 06/11/2017, 8:14 AM

## 2017-06-11 NOTE — Progress Notes (Signed)
Physical Therapy Session Note  Patient Details  Name: Derek Hays MRN: [id number] Date of Birth: 1930/04/19  Today's Date: 06/11/2017 PT Individual Time: 1300-1355 PT Individual Time Calculation (min): 55 min   Short Term Goals: Week 2:  PT Short Term Goal 1 (Week 2): Pt will maintain sitting balance with supervision assist x 10 minutes.  PT Short Term Goal 2 (Week 2): Pt will propell WC 1108ft with min assist  PT Short Term Goal 3 (Week 2): Pt will transfer to [facility] with mod assist  PT Short Term Goal 4 (Week 2): Pt will perform bed mobility with mod assist   Skilled Therapeutic Interventions/Progress Updates: Pt presented in TIS with wife present agreeable to therapy. Pt transported to rehab gym and performed SB transfer to R with maxA x 1. Participated in sit to/from stand from elevated mat x3 with maxA x 2 fading to modA. Pt required max multimodal cues for improving posture and correcting to neutral position. Pt noted to have improved posture with visual aide to look at specific target. Pt indicated sensation for use of toilet. Pt performed additional sit to stand with modA x 2 and was able to maintain fair seated balance to return to w/c. Pt participated in w/c mobility to room 124ft with modA for maintaining straight path. Once in room pt performed sit to stand to transfer to toilet (+BM). Pt left in care of nsg at toilet to perform in/out cath.      Therapy Documentation Precautions:  Precautions Precautions: Fall Precaution Comments:  neglect, severe L hemiparesis Restrictions Weight Bearing Restrictions: No  See Function Navigator for Current Functional Status.   Therapy/Group: Individual Therapy  Eliceo Gladu  [name], PTA  06/11/2017, 2:28 PM

## 2017-06-11 NOTE — Plan of Care (Signed)
  RH BOWEL ELIMINATION RH STG MANAGE BOWEL WITH ASSISTANCE Description STG Manage Bowel with Mod Assistance.    06/11/2017 0442 - Progressing by Etheleen Nicks, RN RH STG MANAGE BOWEL W/MEDICATION W/ASSISTANCE Description STG Manage Bowel with Medication with Max Assistance.   06/11/2017 0442 - Progressing by Etheleen Nicks, RN   RH BLADDER ELIMINATION RH STG MANAGE BLADDER WITH ASSISTANCE Description STG Manage Bladder With  Max Assistance  06/11/2017 0442 - Progressing by Etheleen Nicks, RN RH STG MANAGE BLADDER WITH MEDICATION WITH ASSISTANCE Description STG Manage Bladder With Medication With min Assistance.  06/11/2017 0442 - Progressing by Etheleen Nicks, RN RH STG MANAGE BLADDER WITH EQUIPMENT WITH ASSISTANCE Description STG Manage Bladder With Equipment With total Assistance  06/11/2017 0442 - Progressing by Etheleen Nicks, RN   RH SKIN INTEGRITY RH STG SKIN FREE OF INFECTION/BREAKDOWN Description Skin remain free of breakdown with mod assist  06/11/2017 0442 - Progressing by Etheleen Nicks, RN RH STG MAINTAIN SKIN INTEGRITY WITH ASSISTANCE Description STG Maintain Skin Integrity With max Assistance.  06/11/2017 0442 - Progressing by Etheleen Nicks, RN

## 2017-06-11 NOTE — Progress Notes (Signed)
Speech Language Pathology Daily Session Note  Patient Details  Name: RIEL HIRSCHMAN MRN: [id number] Date of Birth: 08-18-1930  Today's Date: 06/11/2017 SLP Individual Time: 0802-0900 SLP Individual Time Calculation (min): 58 min  Short Term Goals: Week 2: SLP Short Term Goal 1 (Week 2): Pt will consume dysphagia 1 with nectar thick liquids with minimal overt s/s of aspiration and Min A cues for use of compensatory swallow strategies.  SLP Short Term Goal 2 (Week 2): Pt will consume trials of thin liquids with minimal overt s/s of aspiration with Mod A cues for compensatory swallow strategies.  SLP Short Term Goal 3 (Week 2): Pt will demonstrate intellectual awareness by answering yes/no questions regarding physical and swallowing deficits with Mod A cues.  SLP Short Term Goal 4 (Week 2): Pt will scan to midline to locate objects in 50% of opportunities and Max A cues.  SLP Short Term Goal 5 (Week 2): Pt will complete basic familiar tasks with Min A cues.  Skilled Therapeutic Interventions: Skilled ST services focused on swallow and cognitive skills. Pt was alert in bed upon entering room. SLP facilitated PO consumption of breakfast tray, dys 1 and NTL, pt demonstrated self feeding and required min A verbal cues to utilize swallow strategies for intermittent throat clear/reswallow and liquid wash to reduce right buccal pocketing with no overt s/s aspiration. SLP facilitated awareness of intellectual deficits in response to yes/no and [facility] questions required Mod A verbal cues. SLP facilitated functional recall of daily information and diet providing visual aid to increase recall and understanding, pt demonstrated delayed recall of today's schedule and prescribed liquid thickeness with 30 minute delay given min A verbal cues. Pt demonstrated sustained attention during session at Mod I level. Pt was left in bed with call bell within reach and nursing in room. Recommend to continue skilled ST services.      Function:  Eating Eating   Modified Consistency Diet: Yes Eating Assist Level: Set up assist for;Supervision or verbal cues;More than reasonable amount of time   Eating Set Up Assist For: Opening containers       Cognition Comprehension Comprehension assist level: Understands basic 90% of the time/cues < 10% of the time  Expression   Expression assist level: Expresses basic needs/ideas: With no assist  Social Interaction Social Interaction assist level: Interacts appropriately 90% of the time - Needs monitoring or encouragement for participation or interaction.  Problem Solving Problem solving assist level: Solves basic 75 - 89% of the time/requires cueing 10 - 24% of the time  Memory Memory assist level: Recognizes or recalls 75 - 89% of the time/requires cueing 10 - 24% of the time;Recognizes or recalls 90% of the time/requires cueing < 10% of the time    Pain Pain Assessment Pain Assessment: No/denies pain  Therapy/Group: Individual Therapy  Naomie Crow  [facility] 06/11/2017, 9:11 AM

## 2017-06-11 NOTE — Progress Notes (Signed)
Occupational Therapy Session Note  Patient Details  Name: Derek Hays MRN: [id number] Date of Birth: [birth year]  Today's Date: 06/11/2017 OT Individual Time: 7672-0947 OT Individual Time Calculation (min): 75 min    Short Term Goals: Week 2:  OT Short Term Goal 1 (Week 2): Pt will transfer with max A +1 in order to reduce caregiver burden. OT Short Term Goal 2 (Week 2): Pt will maintain static sitting balance on BSC without suport of mechanical lift with min A OT Short Term Goal 3 (Week 2): Pt will roll to R with mod In during bed level ADL  Skilled Therapeutic Interventions/Progress Updates:    Pt seen for OT session focusing on ADL re-training, functional sitting balance, and functional transfers. Pt in supine pon arrival, agreeable to tx session. He transferred toEOB with max A+1, max multimodal cuing for seequencing/technique, and total A to advance L UE/LE. Pt sat EOB with overall min A, VCs to keep R UE in lap in order to prevent pushing tendencies. Pt donned shirt while seated EOB, able to recall hemi dressing tehcniques, however, requiring max cues to attend to L UE during doffing and donning of clothing. Mod A overall for dynamic sitting balance with VCs for midline orientation due to posterior and R LOB.  STEDY used for transfer from EOB> BSC, +2 assist for safety, hwover, pt able to power into standing from slightly elevated EOB with mod A. Once balance obtained, pt able to sit on [facility] with close supervision, feet supported, and cues to keep R UE supinated in lap to prevent pushing tendencies. He stood from [facility] with +2 assist, max multimodal cuing to obtain and maintain erect posture while clothing management completed total A +2.  Pt transferred back to tilt-in space w/c. In BI gym, pt completed [employer] activity from w/c level focusing on attention to L. Pt required mod cuing for attention to L throughout task, though with increased time able to scan to L to locate targets (used  inner 4 rings only). Completed x5 trials with improved performance and scanning during subsequent trials. Pt returned to room at end of session, left seated in tilt-in-space w/c, QRB donned and all needs in reach.   Therapy Documentation Precautions:  Precautions Precautions: Fall Precaution Comments:  neglect, severe L hemiparesis Restrictions Weight Bearing Restrictions: No Pain:   No/denies pain  See Function Navigator for Current Functional Status.   Therapy/Group: Individual Therapy  Danyiel Crespin L 06/11/2017, 7:08 AM

## 2017-06-12 ENCOUNTER — Encounter (HOSPITAL_COMMUNITY): Payer: TRICARE For Life (TFL) | Admitting: Psychology

## 2017-06-12 ENCOUNTER — Inpatient Hospital Stay (HOSPITAL_COMMUNITY): Payer: TRICARE For Life (TFL) | Admitting: Occupational Therapy

## 2017-06-12 ENCOUNTER — Inpatient Hospital Stay (HOSPITAL_COMMUNITY): Payer: TRICARE For Life (TFL) | Admitting: Physical Therapy

## 2017-06-12 ENCOUNTER — Encounter (HOSPITAL_COMMUNITY): Payer: Self-pay

## 2017-06-12 ENCOUNTER — Ambulatory Visit (HOSPITAL_COMMUNITY): Payer: TRICARE For Life (TFL)

## 2017-06-12 DIAGNOSIS — I10 Essential (primary) hypertension: Secondary | ICD-10-CM

## 2017-06-12 DIAGNOSIS — N183 Chronic kidney disease, stage 3 unspecified: Secondary | ICD-10-CM

## 2017-06-12 DIAGNOSIS — D62 Acute posthemorrhagic anemia: Secondary | ICD-10-CM

## 2017-06-12 DIAGNOSIS — I69391 Dysphagia following cerebral infarction: Secondary | ICD-10-CM

## 2017-06-12 DIAGNOSIS — I480 Paroxysmal atrial fibrillation: Secondary | ICD-10-CM

## 2017-06-12 NOTE — Progress Notes (Signed)
Physical Therapy Session Note  Patient Details  Name: Derek Hays MRN: [id number] Date of Birth: October 29, 1930  Today's Date: 06/12/2017 PT Individual Time: 1305-1400 PT Individual Time Calculation (min): 55 min   Short Term Goals: Week 1:  PT Short Term Goal 1 (Week 1): Pt will perform sit<>supine with max assist  PT Short Term Goal 1 - Progress (Week 1): Met PT Short Term Goal 2 (Week 1): Pt will maintain sitting balance x 1 min with mod assist  PT Short Term Goal 2 - Progress (Week 1): Met PT Short Term Goal 3 (Week 1): Pt will initiate WC mobility  PT Short Term Goal 3 - Progress (Week 1): Progressing toward goal PT Short Term Goal 4 (Week 1): Pt will transfer to [facility] with max assist of 1  PT Short Term Goal 4 - Progress (Week 1): Met Week 2:  PT Short Term Goal 1 (Week 2): Pt will maintain sitting balance with supervision assist x 10 minutes.  PT Short Term Goal 2 (Week 2): Pt will propell WC 168f with min assist  PT Short Term Goal 3 (Week 2): [facility]  PT Short Term Goal 4 (Week 2): Pt will perform bed mobility with mod assist  Week 3:     Skilled Therapeutic Interventions/Progress Updates:   Pt received sitting in WC and agreeable to PT. [facility] noted to have sharp edge protruding from Arm rest. PT adjusted arm rest to prevent skin tear on WC.   Sit<>stand in parallel bars x 5 max assist progressing to min assist with use of RUE support on rail. Gait in parallel bars 4 ft x 4 with mod-max assist due to inconsistent activation of the LLE in stance as well as poor ability to sustain erect trunk posture. .   WC mobility with Hemi technique RLE only progressing to use of RUE and RLE x 895fwith supervision assist from PT. Pt noted to have difficulty coordinating use of the UE and LE, so emphasis on use of LE provided by PT.   [facility] and all needs met.         Therapy Documentation Precautions:  Precautions Precautions: Fall Precaution Comments:  neglect, severe L hemiparesis Restrictions Weight Bearing Restrictions: No Pain: Pain Assessment Pain Assessment: No/denies pain    See Function Navigator for Current Functional Status.   Therapy/Group: Individual Therapy  AuLorie Phenix/27/2019, 3:29 PM

## 2017-06-12 NOTE — Progress Notes (Signed)
Speech Language Pathology Daily Session Note  Patient Details  Name: Derek Hays MRN: [id number] Date of Birth: 09-18-1930  Today's Date: 06/12/2017 SLP Individual Time: 1100-1200 SLP Individual Time Calculation (min): 60 min  Short Term Goals: Week 2: SLP Short Term Goal 1 (Week 2): Pt will consume dysphagia 1 with nectar thick liquids with minimal overt s/s of aspiration and Min A cues for use of compensatory swallow strategies.  SLP Short Term Goal 2 (Week 2): Pt will consume trials of thin liquids with minimal overt s/s of aspiration with Mod A cues for compensatory swallow strategies.  SLP Short Term Goal 3 (Week 2): Pt will demonstrate intellectual awareness by answering yes/no questions regarding physical and swallowing deficits with Mod A cues.  SLP Short Term Goal 4 (Week 2): Pt will scan to midline to locate objects in 50% of opportunities and Max A cues.  SLP Short Term Goal 5 (Week 2): Pt will complete basic familiar tasks with Min A cues.  Skilled Therapeutic Interventions: Skilled ST services focused on swallow and cognitive skills. SLP facilitated oral care via suction toothbrush prior to trials of thin. Pt required supervision A verbal cues for basic problem utilizing suction toothbrush. Pt consumed trials of thin via TSP demonstrated 1 delayed cough in 10 trials and clear vocal quality. Pt demonstrated mild throat clearing during trials of thin via cup and minimum anterior spillage due to impairment in labial seal. Pt demonstrated increased swallow initiation and verbal awareness of swallow delay.Pt required mod fading to Min A verbal cues to reswallow following throat clear. Pt requested to use the bathroom,demonstrated ability to follow basic commands with supervision A verbal/question cues and sustained attention with supervision A verbal cues during toileting transfers.Pt recalled basic swallow impairments/ type of thicken liquid and identified physical and cognitive deficits  with Min A verbal cues given yes/no questions . Pt was left in room with call bell within reach. Reccomend to continue skilled ST services.      Function:  Eating Eating   Modified Consistency Diet: No(trials of thin) Eating Assist Level: Set up assist for;Supervision or verbal cues   Eating Set Up Assist For: Opening containers Helper Scoops Food on Utensil: Every scoop     Cognition Comprehension Comprehension assist level: Understands basic 90% of the time/cues < 10% of the time  Expression   Expression assist level: Expresses basic 75 - 89% of the time/requires cueing 10 - 24% of the time. Needs helper to occlude trach/needs to repeat words.  Social Interaction Social Interaction assist level: Interacts appropriately 90% of the time - Needs monitoring or encouragement for participation or interaction.  Problem Solving Problem solving assist level: Solves basic 90% of the time/requires cueing < 10% of the time  Memory Memory assist level: Recognizes or recalls 75 - 89% of the time/requires cueing 10 - 24% of the time    Pain Pain Assessment Pain Assessment: No/denies pain  Therapy/Group: Individual Therapy  [name]  [facility] 06/12/2017, 12:35 PM

## 2017-06-12 NOTE — Progress Notes (Signed)
Subjective/Complaints: Patient seen lying in bed this morning. He appears to have slept well overnight. No reported issues. He is fatigued this AM.  ROS: Appears to deny CP, SOB, nausea, vomiting, diarrhea  Objective: Vital Signs: Blood pressure 128/70, pulse 63, temperature 97.7 F (36.5 C), temperature source Oral, resp. rate 18, height 6' (1.829 m), weight 100.5 kg (221 lb 9 oz), SpO2 97 %. No results found. No results found for this or any previous visit (from the past 72 hour(s)).   HENT: Normocephalic. Atraumatic. Eyes: No discharge. EOMI.  Cardio: RRR. No JVD  Resp: CTA Bilaterally. Normal effort   GI: BS positive and Nontender Skin:   Multiple ecchymoses left upper extremity Neuro: Alert/Oriented,  Motor: 0/5 left upper extremity biceps 2-/5 hip flexion, knee extensor, 0/5 distally Dysarthria Musc/Skel:  No edema. No tenderness. General no acute distress. Vital signs reviewed.   Assessment/Plan: 1. 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 Physiatrist is providing close team supervision and 24 hour management of active medical problems listed below. Physiatrist and rehab team continue to assess barriers to discharge/monitor patient progress toward functional and medical goals. FIM: Function - Bathing Position: Other (comment)(EOB UB, bedlevel LB) Body parts bathed by patient: Abdomen, Chest, Right upper leg Body parts bathed by helper: Right arm, Left arm, Front perineal area, Buttocks, Left upper leg, Right lower leg, Left lower leg, Back Assist Level: 2 helpers  Function- Upper Body Dressing/Undressing What is the patient wearing?: Pull over shirt/dress Pull over shirt/dress - Perfomed by patient: Thread/unthread right sleeve, Put head through opening, Pull shirt  over trunk Pull over shirt/dress - Perfomed by helper: Thread/unthread left sleeve Assist Level: Touching or steadying assistance(Pt > 75%) Function - Lower Body Dressing/Undressing What is the patient wearing?: Pants, Socks, Shoes Position: Wheelchair/chair at sink Pants- Performed by patient: Thread/unthread right pants leg Pants- Performed by helper: Thread/unthread left pants leg, Pull pants up/down Non-skid slipper socks- Performed by helper: Don/doff right sock, Don/doff left sock Socks - Performed by helper: Don/doff right sock, Don/doff left sock Shoes - Performed by helper: Don/doff right shoe, Don/doff left shoe, Fasten right, Fasten left Assist for footwear: Dependant Assist for lower body dressing: 2 Helpers(Standing in STEDY)  Function - Toileting Toileting activity did not occur: No continent bowel/bladder event Toileting steps completed by helper: Adjust clothing prior to toileting, Performs perineal hygiene, Adjust clothing after toileting Assist level: Two helpers  Function - Air cabin crew transfer activity did not occur: (not attempted) Toilet transfer assistive device: Bedside commode, Mechanical lift Mechanical lift: Maximove Assist level to bedside commode (at bedside): 2 helpers Assist level from bedside commode (at bedside): 2 helpers  Function - Chair/bed transfer Chair/bed transfer method: Lateral scoot Chair/bed transfer assist level: Maximal assist (Pt 25 - 49%/lift and lower) Chair/bed transfer assistive device: Armrests, Sliding board  Function - Locomotion: Wheelchair Will patient use wheelchair at discharge?: Yes Type: Manual(tilt in space wc) Wheelchair activity did not occur: Safety/medical concerns Assist Level: Dependent (Pt equals 0%)(for transport) Wheel 50 feet with 2 turns activity did not occur: Safety/medical concerns Wheel 150 feet activity did not occur: Safety/medical concerns Function - Locomotion: Ambulation Ambulation  activity did not occur: Safety/medical concerns Walk 10 feet activity did not occur: Safety/medical concerns Walk 50 feet with 2 turns activity did not occur: Safety/medical concerns Walk 150 feet activity did  not occur: Safety/medical concerns Walk 10 feet on uneven surfaces activity did not occur: Safety/medical concerns  Function - Comprehension Comprehension: Auditory Comprehension assistive device: Hearing aids Comprehension assist level: Understands basic 90% of the time/cues < 10% of the time  Function - Expression Expression: Verbal Expression assist level: Expresses basic needs/ideas: With no assist  Function - Social Interaction Social Interaction assist level: Interacts appropriately 90% of the time - Needs monitoring or encouragement for participation or interaction.  Function - Problem Solving Problem solving assist level: Solves basic 75 - 89% of the time/requires cueing 10 - 24% of the time  Function - Memory Memory assist level: Recognizes or recalls 75 - 89% of the time/requires cueing 10 - 24% of the time, Recognizes or recalls 90% of the time/requires cueing < 10% of the time Patient normally able to recall (first 3 days only): That he or she is in a hospital, Staff names and faces  Medical Problem List and Plan: 1.Left hemiparesis, left neglect, left hemisensory deficitssecondary to right MCA infarction/distal M1 segment occlusion status post revascularizationas well as history of traumatic SDH September 2019 with follow-up CT the head 05/10/2017 showed SDH completely reabsorbed.    Continue CIR    Notes reviewed, images reviewed, labs reviewed. 2. DVT Prophylaxis/Anticoagulation: Subcutaneous Lovenox. Monitor platelet counts in any signs of bleeding.StartedELIQUIS  06/10/2017 5 mg twice a day due to size of infarction. 3. Pain Management:Lyrica 100 mg twice a day, Tylenol as needed, no neuropathic pain c/os 4. Mood:Provide emotional support 5. Neuropsych:  This patientis? Fully capable of making decisions on hisown behalf. 6. Skin/Wound Care:Routine skin checks 7. Fluids/Electrolytes/Nutrition:Routine I&O's  8.Dysphagia. Dysphagia #1 nectar liquids.  -continue to follow for tolerance    9.Hypertension. Tenormin 50 mg daily.  Fair control at present Vitals:   06/11/17 1448 06/12/17 0500  BP: 117/72 128/70  Pulse: (!) 59 63  Resp:  18  Temp:  97.7 F (36.5 C)  SpO2: 100% 97%    Controlled 2/27 10.Atrial fibrillation with history of pacemaker. Cardiac rate controlled. Patient to follow-[facility] 11.Hypothyroidism. Synthroid 12.Hyperlipidemia. Zocor 13.  Constipation: Continue bowel regimen 14. CKD   Cr 1.31 on 2/18   Labs ordered for tomorrow 15.  ABLA   Hb 12.1 on 2/15    Labs ordered for tomorrow     LOS (Days) 13 A FACE TO FACE EVALUATION WAS PERFORMED  Ankit [name] 06/12/2017, 8:50 AM

## 2017-06-12 NOTE — Progress Notes (Signed)
Social Work   Sinclaire Artiga, Eliezer Champagne  Social Worker  Physical Medicine and Rehabilitation  Patient Care Conference  Signed  Date of Service:  06/12/2017 12:05 PM          Signed          [] Hide copied text  [] Hover for details   Inpatient RehabilitationTeam Conference and Plan of Care Update Date: 06/12/2017   Time: 10:45 AM      Patient Name: Derek Hays      Medical Record Number: [id number]  Date of Birth: 08/27/1930 Sex: Male         Room/Bed: 4W07C/4W07C-01 Payor Info: Payor: MEDICARE / Plan: MEDICARE PART A AND B / Product Type: *No Product type* /     Admitting Diagnosis: Rt CVA  Admit Date/Time:  05/30/2017  3:17 PM Admission Comments: No comment available    Primary Diagnosis:  <principal problem not specified> Principal Problem: <principal problem not specified>       Patient Active Problem List    Diagnosis Date Noted  . Acute blood loss anemia    . Stage 3 chronic kidney disease ([place])    . PAF (paroxysmal atrial fibrillation) ([place])    . Benign essential HTN    . Dysphagia, post-stroke    . 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 Stroke (cerebrum) ([place]) 05/27/2017      Expected Discharge Date: Expected Discharge Date: 06/14/17   Team Members Present: Physician leading conference: Dr. Delice Lesch Social Worker Present: Ovidio Kin, LCSW Nurse Present: Isla Pence, RN PT Present: Barrie Folk, PT OT Present: Napoleon Form, OT SLP Present: Charolett Bumpers, SLP PPS Coordinator present : Daiva Nakayama, RN, CRRN       Current Status/Progress Goal Weekly Team Focus  Medical     Left hemiparesis, left neglect, left hemisensory deficits secondary to right MCA infarction/distal M1 segment occlusion status post revascularization as well as history of traumatic SDH September 2019 with follow-up CT the head 05/10/2017 showed SDH completely reabsorbed  Improve mobility, safety, cognition,  CKD, HTN  See above   Bowel/Bladder     continent of bowel, LBM 2/27,with 2 senokot BID, pt had 3 bm's in the last 24 hours , inability to urinate with I&O caths q 8 hours for lower volumes, urocholine and pt is now incontinent of urine or voiding small amounts at times  maintain b/b with mod assist  monitor b/b q shift and prn, I&O cath q 8 hours or prn if volumes over 370ml   Swallow/Nutrition/ Hydration     Dys 1 and NTL  Min A to supervision with least restrictive diet  use of swallow strategies , trials thin    ADL's     Min-mod A sitting balance; mod-max (with +2 for safety) for sliding board; mod A UB bathing/dressing; total A +2 toileting using mechanical lift         Mobility     maxA bed mobility, min/modA sitting balance, maxA 1-2 SB transfer, mod/maxA x 2 sit to/from stand from elevated mat in [place], modA w/c mobility  min-modA  sitting balance, w/c mobility, L NMR, SB transfers   Communication               Safety/Cognition/ Behavioral Observations   Mod A  Min A with basic  basic problem solving, sustained attention, intellectual awareness    Pain     c/o pain to left shoulder at  times, tylenol prn, pt now has sling for left arm for comfort,   pain <2  monitor pain q shift and prn   Skin     large bruise to right groin healing, turning yellow, no other skin issues  maintain skin with min assist  monitor skin q shift and prn     *See Care Plan and progress notes for long and short-term goals.      Barriers to Discharge   Current Status/Progress Possible Resolutions Date Resolved   Physician     Neurogenic Bowel & Bladder;Decreased caregiver support;Medical stability     See above  Therapies, optimize Bp meds, follow labs      Nursing                 PT                    OT                 SLP            SW              Discharge Planning/Teaching Needs:  Wife is not able to provide the care pt will require from rehab will begin NH search.       Team  Discussion:  Progressing slow and steady in therapies. Increased fluid intake and sitting balance better. Sling ordered for left arm for support. BP controlled and checking labs in am. Dys 1 nectar thick diet due to attention and safety issues. Trials of water and dys 2 with speech therapy. Downgraded goals to mod/max level. Will begin NH search wife not able to manage pt at this level  Revisions to Treatment Plan:  NHP    Continued Need for Acute Rehabilitation Level of Care: The patient requires daily medical management by a physician with specialized training in physical medicine and rehabilitation for the following conditions: Daily direction of a multidisciplinary physical rehabilitation program to ensure safe treatment while eliciting the highest outcome that is of practical value to the patient.: Yes Daily medical management of patient stability for increased activity during participation in an intensive rehabilitation regime.: Yes Daily analysis of laboratory values and/or radiology reports with any subsequent need for medication adjustment of medical intervention for : Neurological problems;Urological problems;Renal problems;Blood pressure problems   Louna Rothgeb, Gardiner Rhyme 06/12/2017, 12:05 PM                  Ericka Marcellus, Gardiner Rhyme, LCSW  Social Worker  Physical Medicine and Rehabilitation  Patient Care Conference  Signed  Date of Service:  06/05/2017 12:47 PM          Signed          [] Hide copied text  [] Hover for details   Inpatient RehabilitationTeam Conference and Plan of Care Update Date: 06/05/2017   Time: 10:45 AM      Patient Name: Derek Hays      Medical Record Number: [id number]  Date of Birth: 25-Jul-1930 Sex: Male         Room/Bed: 4W07C/4W07C-01 Payor Info: Payor: MEDICARE / Plan: MEDICARE PART A AND B / Product Type: *No Product type* /     Admitting Diagnosis: Rt CVA  Admit Date/Time:  05/30/2017  3:17 PM Admission Comments: No comment available      Primary Diagnosis:  <principal problem not specified> Principal Problem: <principal problem not specified>       Patient Active Problem List  Diagnosis Date Noted  . 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 Stroke (cerebrum) ([place]) 05/27/2017      Expected Discharge Date: Expected Discharge Date: 06/14/17   Team Members Present: Physician leading conference: Dr. Alysia Penna Social Worker Present: Ovidio Kin, LCSW Nurse Present: Other (comment)(Denise Lloyd-RN) PT Present: Barrie Folk, PT;Rosita Dechalus, PTA OT Present: Napoleon Form, OT SLP Present: Stormy Fabian, SLP PPS Coordinator present : Daiva Nakayama, RN, CRRN       Current Status/Progress Goal Weekly Team Focus  Medical     Patient with severe left hemiparesis and left neglect.  Has awareness of deficits  Upgrade mobility to 1 person assist, maintain medical stability  Work on bladder emptying   Bowel/Bladder       Continence   Incontinent B & B-urinary rentension   Timed tolieting-MD bladder meds  Swallow/Nutrition/ Hydration     dysphagia 1 with nectar thick liquids  Min A to supervision with least restrictive diet  consumption of dysphagia 1 with nectar trials of water with SLP only and Max A cues to use chin tuck   ADL's     Mod-max A sitting balance; +2 sliding board transfers; total A +2 LB dressing; max A UB bathing/dressing  Min A overall will likely downgrade due to slow pt progress  Static sitting banace, functional transfers, ADL re-training, neuro re-ed   Mobility     MaxA bed mobility, max/maxA sitting balance, maxA x 2 SB transfers, total assist standing  min-modA  sitting balance, transfers, L NMR, d/c planning   Communication               Safety/Cognition/ Behavioral Observations   Mod A to Max A for basic problem solving, possibly d/t left inattention and no overall awareness  Min A with basic  basic problem solving,  overall attention and continuation of tasks, intellectual awareness   Pain               Skin          monitor skin currently no issues       *See Care Plan and progress notes for long and short-term goals.      Barriers to Discharge   Current Status/Progress Possible Resolutions Date Resolved   Physician     Neurogenic Bowel & Bladder;Decreased caregiver support;Medical stability  Requiring intermittent catheterization, wife cannot provide physical assistance  Slow progress towards goals  Likely will need SNF      Nursing                 PT                    OT                 SLP Lack of/limited family support            SW              Discharge Planning/Teaching Needs:  Wife has health issues of her own and can not provide physical assist, will probably need NHP      Team Discussion:  Goals mod/max level of assist. Pt has had a severe CVA. I & O cathing required due to retention. MBS on Friday currently on Dys 1 nectar thick liquid diet. Pain in shoulder MD aware of. Currently total assist plus 2. Do not feel wife will be able to provide the care pt  will require due to her own health issues.  Revisions to Treatment Plan:  NHP    Continued Need for Acute Rehabilitation Level of Care: The patient requires daily medical management by a physician with specialized training in physical medicine and rehabilitation for the following conditions: Daily direction of a multidisciplinary physical rehabilitation program to ensure safe treatment while eliciting the highest outcome that is of practical value to the patient.: Yes Daily medical management of patient stability for increased activity during participation in an intensive rehabilitation regime.: Yes Daily analysis of laboratory values and/or radiology reports with any subsequent need for medication adjustment of medical intervention for : Neurological problems;Urological problems   [name] 06/07/2017, 8:56 AM                  Patient ID: [name], male   DOB: 1930/04/22, 84 y.o.   MRN: [id number]

## 2017-06-12 NOTE — Patient Care Conference (Signed)
Inpatient RehabilitationTeam Conference and Plan of Care Update Date: 06/12/2017   Time: 10:45 AM    Patient Name: Derek Hays      Medical Record Number: [id number]  Date of Birth: 1931/03/16 Sex: Male         Room/Bed: 4W07C/4W07C-01 Payor Info: Payor: MEDICARE / Plan: MEDICARE PART A AND B / Product Type: *No Product type* /    Admitting Diagnosis: Rt CVA  Admit Date/Time:  05/30/2017  3:17 PM Admission Comments: No comment available   Primary Diagnosis:  <principal problem not specified> Principal Problem: <principal problem not specified>  Patient Active Problem List   Diagnosis Date Noted  . Acute blood loss anemia   . Stage 3 chronic kidney disease (Derek Hays)   . PAF (paroxysmal atrial fibrillation) (Derek Hays)   . Benign essential HTN   . Dysphagia, post-stroke   . Right middle cerebral artery stroke (Derek Hays) 05/30/2017  . 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 Stroke (cerebrum) (Derek Hays) 05/27/2017    Expected Discharge Date: Expected Discharge Date: 06/14/17  Team Members Present: Physician leading conference: Dr. Delice Lesch Social Worker Present: Ovidio Kin, LCSW Nurse Present: Isla Pence, RN PT Present: Barrie Folk, PT OT Present: Napoleon Form, OT SLP Present: Charolett Bumpers, SLP PPS Coordinator present : Daiva Nakayama, RN, CRRN     Current Status/Progress Goal Weekly Team Focus  Medical   Left hemiparesis, left neglect, left hemisensory deficits secondary to right MCA infarction/distal M1 segment occlusion status post revascularization as well as history of traumatic SDH September 2019 with follow-up CT the head 05/10/2017 showed SDH completely reabsorbed  Improve mobility, safety, cognition, CKD, HTN  See above   Bowel/Bladder   continent of bowel, LBM 2/27,with 2 senokot BID, pt had 3 bm's in the last 24 hours , inability to urinate with I&O caths q 8 hours for lower volumes, urocholine and pt is now incontinent of urine or voiding small amounts at times   maintain b/b with mod assist  monitor b/b q shift and prn, I&O cath q 8 hours or prn if volumes over 382ml   Swallow/Nutrition/ Hydration   Dys 1 and NTL  Min A to supervision with least restrictive diet  use of swallow strategies , trials thin    ADL's   Min-mod A sitting balance; mod-max (with +2 for safety) for sliding board; mod A UB bathing/dressing; total A +2 toileting using mechanical lift         Mobility   maxA bed mobility, min/modA sitting balance, maxA 1-2 SB transfer, mod/maxA x 2 sit to/from stand from elevated mat in [place], modA w/c mobility  min-modA  sitting balance, w/c mobility, L NMR, SB transfers   Communication             Safety/Cognition/ Behavioral Observations  Mod A  Min A with basic  basic problem solving, sustained attention, intellectual awareness    Pain   c/o pain to left shoulder at times, tylenol prn, pt now has sling for left arm for comfort,   pain <2  monitor pain q shift and prn   Skin   large bruise to right groin healing, turning yellow, no other skin issues  maintain skin with min assist  monitor skin q shift and prn      *See Care Plan and progress notes for long and short-term goals.     Barriers to Discharge  Current Status/Progress Possible Resolutions Date Resolved   Physician    Neurogenic Bowel & Bladder;Decreased  caregiver support;Medical stability     See above  Therapies, optimize Bp meds, follow labs      Nursing                  PT                    OT                  SLP                SW                Discharge Planning/Teaching Needs:  Wife is not able to provide the care pt will require from rehab will begin NH search.       Team Discussion:  Progressing slow and steady in therapies. Increased fluid intake and sitting balance better. Sling ordered for left arm for support. BP controlled and checking labs in am. Dys 1 nectar thick diet due to attention and safety issues. Trials of water and dys 2 with speech  therapy. Downgraded goals to mod/max level. Will begin NH search wife not able to manage pt at this level  Revisions to Treatment Plan:  NHP    Continued Need for Acute Rehabilitation Level of Care: The patient requires daily medical management by a physician with specialized training in physical medicine and rehabilitation for the following conditions: Daily direction of a multidisciplinary physical rehabilitation program to ensure safe treatment while eliciting the highest outcome that is of practical value to the patient.: Yes Daily medical management of patient stability for increased activity during participation in an intensive rehabilitation regime.: Yes Daily analysis of laboratory values and/or radiology reports with any subsequent need for medication adjustment of medical intervention for : Neurological problems;Urological problems;Renal problems;Blood pressure problems  Derek Hays 06/12/2017, 12:05 PM

## 2017-06-12 NOTE — Progress Notes (Addendum)
Occupational Therapy Weekly Progress Note  Patient Details  Name: Derek Hays MRN: [id number] Date of Birth: Aug 25, 1930  Beginning of progress report period: June 06, 2017 End of progress report period: June 12, 2017  Today's Date: 06/12/2017 OT Individual Time: 6767-2094 and 1430-1500 OT Individual Time Calculation (min): 60 min and 30 min   Patient has met 3 of 3 short term goals.  Pt making slow but steady progress towards OT goals. He demonstrates improved functional sitting balance and is now able to transfer with use of STEDY (recommend +2 for safety). He cont to be most limited by dense L hemiplegia with no sensation in L UE and trace movements of shoulder flexion, and L inattention/neglect. Recommend pt to d/c to SNF in order to reduce burden of care.   Patient continues to demonstrate the following deficits: abnormal posture, ataxia, cognitive deficits, disturbance of vision, flaccid hemiplegia and hemiparesis, hemiplegia affecting non-dominant side and muscle weakness (generalized)  and therefore will continue to benefit from skilled OT intervention to enhance overall performance with BADL and Reduce care partner burden.  Patient progressing toward long term goals..  Plan of care revisions: Dynamic standing goal d/c as it is not at functional level. Toileting goal also d/c as pt will require total A for all steps of task. See POC for goal details.  OT Short Term Goals Week 2:  OT Short Term Goal 1 (Week 2): Pt will transfer with max A +1 in order to reduce caregiver burden. OT Short Term Goal 1 - Progress (Week 2): Met OT Short Term Goal 2 (Week 2): Pt will maintain static sitting balance on BSC without suport of mechanical lift with min A OT Short Term Goal 2 - Progress (Week 2): Met OT Short Term Goal 3 (Week 2): Pt will roll to R with mod In during bed level ADL OT Short Term Goal 3 - Progress (Week 2): Met Week 3:  OT Short Term Goal 1 (Week 3): STG=LTG due to  awaiting SNF placement  Skilled Therapeutic Interventions/Progress Updates:    Session One: Pt seen for OT session focusing on ADL re-training, functional transfers, and functional sitting balance. Pt in supine upon arrival eating breakfast with NT present, pt agreeable to tx session.  Throughout session, pt much more impulsive than in previous sessions, requiring frequent cuing for safety.  He transferred to EOB with max A +1, assist to advance L extremities to EOB. Once sitting balance obtained, pt able to maintain at overall close supervision level with VCs to prevent pushing tendencies ( R UE on bed).  Utilized STEDY with +1 assist to transfer to w/c. HE dressed UB/LB from w/c level at sink, max A for UB and total A for LB dressing and +2 for safety when standing in STEDY to pull pants up. He brushed hair with set-up assist, required cuing to attend to L side of head.  In therapy gym, pt completed max A sliding board transfer with +2 assist. He maintain static and dynamic sitting balance EOM with overall min A, while reaching forward and across midline to pick up cone and place on floor. Pt able to return to midline with guarding assist, mirror placed in front of pt for visual feedback of midline. Utilized UE reacher while seated EOM, trace movements of shoulder flexion observed with tactile assist provided for stabilization of joint. He returned to w/c at end of session, completed sliding board transfer to strong R side. Once pt able to reach armrest, he  could advance himself further into chair with min A.  Pt returned to room at end of session, left seated in standard w/c, QRB donned and all needs in reach.   Session Two: Pt seen for OT session focusing on ADL re-training with core strengthening/stability. Pt sitting up in w/c upon arrival, agreement to tx session with wife present. Pt requesting to shave with electric razor wife brought. Pt required to lean forward off back of w/c to complete  task, R UE placed in weightbearing position on sink ledge and min A required to maintain anterior weight shift/ unsupported sitting balance while pt shaved. Min cuing to attend to L side of face.  He then completed sliding board transfer back to bed. Max A +1 for transfer with +2 for safety and assist with set-up of transfer. Seated EOB, pt able to bend forward to unlace B shoes with min steadying assist. Returned to supine with mod A. Pt left in supine with all needs in reach, bed alarm on and L UE in supported position. Continued to provide education of L UE sling to be worn only during transfers, sign posted above HOB.    Therapy Documentation Precautions:  Precautions Precautions: Fall Precaution Comments:  neglect, severe L hemiparesis Restrictions Weight Bearing Restrictions: No Pain:   No/denies pain  See Function Navigator for Current Functional Status.   Therapy/Group: Individual Therapy  Citlali Gautney L 06/12/2017, 7:07 AM

## 2017-06-12 NOTE — Plan of Care (Signed)
PT Downgraded pt  long term goals due to slow progress. See care plan.

## 2017-06-13 ENCOUNTER — Inpatient Hospital Stay (HOSPITAL_COMMUNITY): Payer: TRICARE For Life (TFL) | Admitting: Occupational Therapy

## 2017-06-13 ENCOUNTER — Ambulatory Visit (HOSPITAL_COMMUNITY): Payer: TRICARE For Life (TFL) | Admitting: Speech Pathology

## 2017-06-13 ENCOUNTER — Inpatient Hospital Stay (HOSPITAL_COMMUNITY): Payer: TRICARE For Life (TFL) | Admitting: Physical Therapy

## 2017-06-13 LAB — BASIC METABOLIC PANEL
Anion gap: 11 (ref 5–15)
BUN: 24 mg/dL — AB (ref 6–20)
CALCIUM: 9.1 mg/dL (ref 8.9–10.3)
CHLORIDE: 106 mmol/L (ref 101–111)
CO2: 25 mmol/L (ref 22–32)
CREATININE: 1.43 mg/dL — AB (ref 0.61–1.24)
GFR calc Af Amer: 50 mL/min — ABNORMAL LOW (ref >60)
GFR calc non Af Amer: 43 mL/min — ABNORMAL LOW (ref >60)
GLUCOSE: 96 mg/dL (ref 65–99)
Potassium: 4.5 mmol/L (ref 3.5–5.1)
Sodium: 142 mmol/L (ref 135–145)

## 2017-06-13 LAB — CBC WITH DIFFERENTIAL/PLATELET
BASOS PCT: 1 %
Basophils Absolute: 0 10*3/uL (ref 0.0–0.1)
EOS ABS: 0.4 10*3/uL (ref 0.0–0.7)
EOS PCT: 5 %
HCT: 40.2 % (ref 39.0–52.0)
HEMOGLOBIN: 13.2 g/dL (ref 13.0–17.0)
LYMPHS ABS: 1.6 10*3/uL (ref 0.7–4.0)
Lymphocytes Relative: 21 %
MCH: 30.1 pg (ref 26.0–34.0)
MCHC: 32.8 g/dL (ref 30.0–36.0)
MCV: 91.8 fL (ref 78.0–100.0)
MONO ABS: 0.6 10*3/uL (ref 0.1–1.0)
MONOS PCT: 8 %
NEUTROS PCT: 65 %
Neutro Abs: 4.9 10*3/uL (ref 1.7–7.7)
Platelets: 232 10*3/uL (ref 150–400)
RBC: 4.38 MIL/uL (ref 4.22–5.81)
RDW: 13.8 % (ref 11.5–15.5)
WBC: 7.5 10*3/uL (ref 4.0–10.5)

## 2017-06-13 NOTE — Progress Notes (Signed)
Speech Language Pathology Weekly Progress and Session Note  Patient Details  Name: Derek Hays MRN: [id number] Date of Birth: [birth year]  Beginning of progress report period: June 06, 2017 End of progress report period: June 13, 2017  Today's Date: 06/13/2017 SLP Individual Time: 0730-0830 SLP Individual Time Calculation (min): 60 min  Short Term Goals: Week 2: SLP Short Term Goal 1 (Week 2): Pt will consume dysphagia 1 with nectar thick liquids with minimal overt s/s of aspiration and Min A cues for use of compensatory swallow strategies.  SLP Short Term Goal 1 - Progress (Week 2): Met SLP Short Term Goal 2 (Week 2): Pt will consume trials of thin liquids with minimal overt s/s of aspiration with Mod A cues for compensatory swallow strategies.  SLP Short Term Goal 2 - Progress (Week 2): Met SLP Short Term Goal 3 (Week 2): Pt will demonstrate intellectual awareness by answering yes/no questions regarding physical and swallowing deficits with Mod A cues.  SLP Short Term Goal 3 - Progress (Week 2): Met SLP Short Term Goal 4 (Week 2): Pt will scan to midline to locate objects in 50% of opportunities and Max A cues.  SLP Short Term Goal 4 - Progress (Week 2): Met SLP Short Term Goal 5 (Week 2): Pt will complete basic familiar tasks with Min A cues. SLP Short Term Goal 5 - Progress (Week 2): Met    New Short Term Goals: Week 3: SLP Short Term Goal 1 (Week 3): Pt will consume trials of dysphagia 2 textures with Mod A cues for use of compensatory swallow strategies to demonstrate readiness for diet upgrade.  SLP Short Term Goal 2 (Week 3): Pt will consume trials of thin liquids with minimal overt s/s of aspiration with Min A cues for compensatory swallow strategies.  SLP Short Term Goal 3 (Week 3): Pt will demonstrate intellectual awareness by identifying 2 physical and 2 cognitive deficits related to new CVA with Mod A cues.  SLP Short Term Goal 4 (Week 3): Pt will scan to midline  to locate objects in 50% of opportunities and Mod A cues.  SLP Short Term Goal 5 (Week 3): Pt will complete basic  to semi complex tasks with Min A cues.  Weekly Progress Updates: Pt has made good progress this reporting periods as evidenced by meeting 5 of 5 STGs. Pt with specific increase in alertness and awareness (of swallowing deficits/ need to consume nectar thick liquids). Pt with good efficiency when consuming thin liquids and is currently on water protocol. Pt continues to exhibit oral phase deficit, left inattention and the ability to complete more complex problem solving tasks. Further skilled ST is required to address these deficits and hopefully progress pt to thin liquids. Anticipate that pt will require assistance from SNF at discharge from CIR.      Intensity: Minumum of 1-2 x/day, 30 to 90 minutes Frequency: 3 to 5 out of 7 days Duration/Length of Stay: 3 to 4 weeks Treatment/Interventions: Cognitive remediation/compensation;Cueing hierarchy;Dysphagia/aspiration precaution training;Functional tasks;Patient/family education;Therapeutic Activities;Speech/Language facilitation;Environmental controls;Internal/external aids   Daily Session  Skilled Therapeutic Interventions: Skilled treatment session focused on dysphagia goals. SLP facilitated session by providing skilled observation of pt consuming thin water via cup. Pt with no overt s/s of aspiration. MD in to perform rounds on pt and stated that pt was discharging to SNF today. Therefore, SLP assessed consumption of thin liquids with puree breakfast. Pt with wet voice followed by throat clear x 2 towards end of meal. Recommend water  protocol with continued skilled observation of consumption of thin liquids with food textures before upgrade. Can upgrade to thin liquids at bedside if pt continues to demonstrate efficiency of swallow. Of note, pt is not discharging to SNF today.      Function:   Eating Eating   Modified Consistency  Diet: Yes Eating Assist Level: Set up assist for;Supervision or verbal cues;More than reasonable amount of time   Eating Set Up Assist For: Opening containers Helper Scoops Food on Utensil: Every scoop     Cognition Comprehension Comprehension assist level: Understands basic 75 - 89% of the time/ requires cueing 10 - 24% of the time;Understands basic 50 - 74% of the time/ requires cueing 25 - 49% of the time  Expression   Expression assist level: Expresses basic 75 - 89% of the time/requires cueing 10 - 24% of the time. Needs helper to occlude trach/needs to repeat words.  Social Interaction Social Interaction assist level: Interacts appropriately with others with medication or extra time (anti-anxiety, antidepressant).  Problem Solving Problem solving assist level: Solves basic 50 - 74% of the time/requires cueing 25 - 49% of the time  Memory Memory assist level: Recognizes or recalls 75 - 89% of the time/requires cueing 10 - 24% of the time   General    Pain Pain Assessment Pain Assessment: No/denies pain  Therapy/Group: Individual Therapy  Mishell Donalson 06/13/2017, 1:25 PM

## 2017-06-13 NOTE — Discharge Instructions (Signed)
Inpatient Rehab Discharge Instructions  Derek Hays Discharge date and time: No discharge date for patient encounter.   Activities/Precautions/ Functional Status: Activity: activity as tolerated Diet: Dysphagia #1 nectar liquids Wound Care: None needed Functional status:  STROKE/TIA DISCHARGE INSTRUCTIONS SMOKING Cigarette smoking nearly doubles your risk of having a stroke & is the single most alterable risk factor  If you smoke or have smoked in the last 12 months, you are advised to quit smoking for your health.  Most of the excess cardiovascular risk related to smoking disappears within a year of stopping.  Ask you doctor about anti-smoking medications  [place] Quit Line: 1-800-QUIT NOW  Free Smoking Cessation Classes [phone]  CHOLESTEROL Know your levels; limit fat & cholesterol in your diet  Lipid Panel     Component Value Date/Time   CHOL 133 05/28/2017 0611   TRIG 66 05/28/2017 0611   HDL 43 05/28/2017 0611   CHOLHDL 3.1 05/28/2017 0611   VLDL 13 05/28/2017 0611   LDLCALC 77 05/28/2017 0611      Many patients benefit from treatment even if their cholesterol is at goal.  Goal: Total Cholesterol (CHOL) less than 160  Goal:  Triglycerides (TRIG) less than 150  Goal:  HDL greater than 40  Goal:  LDL (LDLCALC) less than 100   BLOOD PRESSURE American Stroke Association blood pressure target is less that 120/80 mm/Hg  Your discharge blood pressure is:  BP: 115/68  Monitor your blood pressure  Limit your salt and alcohol intake  Many individuals will require more than one medication for high blood pressure  DIABETES (A1c is a blood sugar average for last 3 months) Goal HGBA1c is under 7% (HBGA1c is blood sugar average for last 3 months)  Diabetes: No known diagnosis of diabetes    Lab Results  Component Value Date   HGBA1C 5.4 05/28/2017     Your HGBA1c can be lowered with medications, healthy diet, and exercise.  Check your blood sugar as directed by  your physician  Call your physician if you experience unexplained or low blood sugars.  PHYSICAL ACTIVITY/REHABILITATION Goal is 30 minutes at least 4 days per week  Activity: Increase activity slowly, Therapies: Physical Therapy: Home Health Return to work:   Activity decreases your risk of heart attack and stroke and makes your heart stronger.  It helps control your weight and blood pressure; helps you relax and can improve your mood.  Participate in a regular exercise program.  Talk with your doctor about the best form of exercise for you (dancing, walking, swimming, cycling).  DIET/WEIGHT Goal is to maintain a healthy weight  Your discharge diet is: Fall precautions DIET - DYS 1 Room service appropriate? Yes; Fluid consistency: Nectar Thick  liquids Your height is:  Height: 6' (182.9 cm) Your current weight is: Weight: 100.5 kg (221 lb 9 oz) Your Body Mass Index (BMI) is:  BMI (Calculated): 30.04  Following the type of diet specifically designed for you will help prevent another stroke.  Your goal weight range is:    Your goal Body Mass Index (BMI) is 19-24.  Healthy food habits can help reduce 3 risk factors for stroke:  High cholesterol, hypertension, and excess weight.  RESOURCES Stroke/Support Group:  Call [phone]   STROKE EDUCATION PROVIDED/REVIEWED AND GIVEN TO PATIENT Stroke warning signs and symptoms How to activate emergency medical system (call 911). Medications prescribed at discharge. Need for follow-up after discharge. Personal risk factors for stroke. Pneumonia vaccine given:  Flu vaccine  given:  My questions have been answered, the writing is legible, and I understand these instructions.  I will adhere to these goals & educational materials that have been provided to me after my discharge from the hospital.    ___ No restrictions     ___ Walk up steps independently ___ 24/7 supervision/assistance   ___ Walk up steps with assistance ___ Intermittent  supervision/assistance  ___ Bathe/dress independently ___ Walk with walker     _x STROKE/TIA DISCHARGE INSTRUCTIONS SMOKING Cigarette smoking nearly doubles your risk of having a stroke & is the single most alterable risk factor  If you smoke or have smoked in the last 12 months, you are advised to quit smoking for your health.  Most of the excess cardiovascular risk related to smoking disappears within a year of stopping.  Ask you doctor about anti-smoking medications  [place] Quit Line: 1-800-QUIT NOW  Free Smoking Cessation Classes [phone]  CHOLESTEROL Know your levels; limit fat & cholesterol in your diet  Lipid Panel     Component Value Date/Time   CHOL 133 05/28/2017 0611   TRIG 66 05/28/2017 0611   HDL 43 05/28/2017 0611   CHOLHDL 3.1 05/28/2017 0611   VLDL 13 05/28/2017 0611   LDLCALC 77 05/28/2017 0611      Many patients benefit from treatment even if their cholesterol is at goal.  Goal: Total Cholesterol (CHOL) less than 160  Goal:  Triglycerides (TRIG) less than 150  Goal:  HDL greater than 40  Goal:  LDL (LDLCALC) less than 100   BLOOD PRESSURE American Stroke Association blood pressure target is less that 120/80 mm/Hg  Your discharge blood pressure is:  BP: 115/68  Monitor your blood pressure  Limit your salt and alcohol intake  Many individuals will require more than one medication for high blood pressure  DIABETES (A1c is a blood sugar average for last 3 months) Goal HGBA1c is under 7% (HBGA1c is blood sugar average for last 3 months)  Diabetes: No known diagnosis of diabetes    Lab Results  Component Value Date   HGBA1C 5.4 05/28/2017     Your HGBA1c can be lowered with medications, healthy diet, and exercise.  Check your blood sugar as directed by your physician  Call your physician if you experience unexplained or low blood sugars.  PHYSICAL ACTIVITY/REHABILITATION Goal is 30 minutes at least 4 days per week  Activity: Increase activity  slowly, Therapies: Physical Therapy: Home Health Return to work:   Activity decreases your risk of heart attack and stroke and makes your heart stronger.  It helps control your weight and blood pressure; helps you relax and can improve your mood.  Participate in a regular exercise program.  Talk with your doctor about the best form of exercise for you (dancing, walking, swimming, cycling).  DIET/WEIGHT Goal is to maintain a healthy weight  Your discharge diet is: Fall precautions DIET - DYS 1 Room service appropriate? Yes; Fluid consistency: Nectar Thick  liquids Your height is:  Height: 6' (182.9 cm) Your current weight is: Weight: 100.5 kg (221 lb 9 oz) Your Body Mass Index (BMI) is:  BMI (Calculated): 30.04  Following the type of diet specifically designed for you will help prevent another stroke.  Your goal weight range is:    Your goal Body Mass Index (BMI) is 19-24.  Healthy food habits can help reduce 3 risk factors for stroke:  High cholesterol, hypertension, and excess weight.  RESOURCES Stroke/Support Group:  Call [phone]  STROKE EDUCATION PROVIDED/REVIEWED AND GIVEN TO PATIENT Stroke warning signs and symptoms How to activate emergency medical system (call 911). Medications prescribed at discharge. Need for follow-up after discharge. Personal risk factors for stroke. Pneumonia vaccine given:  Flu vaccine given:  My questions have been answered, the writing is legible, and I understand these instructions.  I will adhere to these goals & educational materials that have been provided to me after my discharge from the hospital.   __ Bathe/dress with assistance ___ Walk Independently    ___ Shower independently ___ Walk with assistance    ___ Shower with assistance ___ No alcohol     ___ Return to work/school ________  Special Instructions:    My questions have been answered and I understand these instructions. I will adhere to these goals and the provided  educational materials after my discharge from the hospital.  Patient/Caregiver Signature _______________________________ Date __________  Clinician Signature _______________________________________ Date __________  Please bring this form and your medication list with you to all your follow-up doctor's appointments.   Information on my medicine - ELIQUIS (apixaban)  This medication education was reviewed with me or my healthcare representative as part of my discharge preparation.  The pharmacist that spoke with me during my hospital stay was:  Tad Moore, [facility]  Why was Eliquis prescribed for you? Eliquis was prescribed for you to reduce the risk of forming blood clots that can cause a stroke if you have a medical condition called atrial fibrillation (a type of irregular heartbeat) OR to reduce the risk of a blood clots forming after orthopedic surgery.  What do You need to know about Eliquis ? Take your Eliquis TWICE DAILY - one tablet in the morning and one tablet in the evening with or without food.  It would be best to take the doses about the same time each day.  If you have difficulty swallowing the tablet whole please discuss with your pharmacist how to take the medication safely.  Take Eliquis exactly as prescribed by your doctor and DO NOT stop taking Eliquis without talking to the doctor who prescribed the medication.  Stopping may increase your risk of developing a new clot or stroke.  Refill your prescription before you run out.  After discharge, you should have regular check-up appointments with your healthcare provider that is prescribing your Eliquis.  In the future your dose may need to be changed if your kidney function or weight changes by a significant amount or as you get older.  What do you do if you miss a dose? If you miss a dose, take it as soon as you remember on the same day and resume taking twice daily.  Do not take more than one dose of ELIQUIS at the  same time.  Important Safety Information A possible side effect of Eliquis is bleeding. You should call your healthcare provider right away if you experience any of the following: ? Bleeding from an injury or your nose that does not stop. ? Unusual colored urine (red or dark brown) or unusual colored stools (red or black). ? Unusual bruising for unknown reasons. ? A serious fall or if you hit your head (even if there is no bleeding).  Some medicines may interact with Eliquis and might increase your risk of bleeding or clotting while on Eliquis. To help avoid this, consult your healthcare provider or pharmacist prior to using any new prescription or non-prescription medications, including herbals, vitamins, non-steroidal anti-inflammatory drugs (NSAIDs) and supplements.  This website has more information on Eliquis (apixaban): [email or web address].

## 2017-06-13 NOTE — Progress Notes (Signed)
Occupational Therapy Session Note  Patient Details  Name: Derek Hays MRN: [id number] Date of Birth: February 26, 1931  Today's Date: 06/13/2017 OT Individual Time: 1517-6160 OT Individual Time Calculation (min): 69 min    Short Term Goals: Week 2:  OT Short Term Goal 1 (Week 2): Pt will transfer with max A +1 in order to reduce caregiver burden. OT Short Term Goal 1 - Progress (Week 2): Met OT Short Term Goal 2 (Week 2): Pt will maintain static sitting balance on BSC without suport of mechanical lift with min A OT Short Term Goal 2 - Progress (Week 2): Met OT Short Term Goal 3 (Week 2): Pt will roll to R with mod In during bed level ADL OT Short Term Goal 3 - Progress (Week 2): Met  Skilled Therapeutic Interventions/Progress Updates:    Pt seen this session to focus on L side visual awareness, trunk control and sit to stand.  Pt received getting out of bed with nurse techs to get to the stedy as he needed to toilet.  This therapist worked with one tech to have pt move to EOB (needed max A with balance), stand in stedy with only min A (one person on each side for safety), and transferred to toilet. Once on toilet pt was able to sit safely as he had support from stedy. Pt requested to sit for several minutes but he only had gas.   Stood to stedy with 2nd person present but he again only needed min A.  Pt transferred to w/c for dressing.  W/c placed adjacent to bed rail so he could use R hand on rail.   Pt stood pulling on rail with therapist on his L side supporting his knee and L arm.  Rehab tech on other side for safety. Pt stood up with mod A.  He continues to need mod-max with UB dressing and max to total with LB dressing.  When sitting in w/c without back support, he needed max A to support balance as he will either lean back or to the left.  Pt's standard w/c was not safe as the cushion was too long, so used slideboard with total A of 2 to transfer to L to the TIS w/c.  Pt cued to turn head to L  first to see where he was going, then lean and look to the R. Pt adjusted with quick release belt on. Set up at sink for oral care with Supervision/ set up.  Pt followed water protocol, but only drank 3 oz of water. Pt resting in w/c in room with all needs met.   Therapy Documentation Precautions:  Precautions Precautions: Fall Precaution Comments:  neglect, severe L hemiparesis Restrictions Weight Bearing Restrictions: No    Pain: Pain Assessment Pain Assessment: No/denies pain ADL:    See Function Navigator for Current Functional Status.   Therapy/Group: Individual Therapy  [place] 06/13/2017, 12:01 PM

## 2017-06-13 NOTE — Discharge Summary (Signed)
NAMENOAH, LEMBKE NO.:  [id number]  MEDICAL RECORD NO.:  [id number]  LOCATION:                                 FACILITY:  PHYSICIAN:  Charlett Blake, M.D.DATE OF BIRTH:  [birth year]  DATE OF ADMISSION:  05/30/2017 DATE OF DISCHARGE: 06/20/2017                              DISCHARGE SUMMARY  Admit date 05/30/2017 DISCHARGE DATE:  06/20/2017  DISCHARGE DIAGNOSES: 1. 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 2. Subcutaneous Lovenox for deep venous thrombosis prophylaxis. 3. Pain management. 4. Dysphagia. 5. Hypertension. 6. Atrial fibrillation with pacemaker. 7. Hypothyroidism. 8. Hyperlipidemia. 9. Constipation. 10.Chronic renal insufficiency. 11. Urinary retention/Citrobacter UTI  HISTORY OF PRESENT ILLNESS:  This is an 84 year old right-handed male with history of hypertension, atrial fibrillation with pacemaker had been maintained on Eliquis, discontinued due to traumatic subdural hematoma in September 2018.  Repeat head CT scan on May 10, 2017, showed SDH completely reabsorbed, followup by Dr. Bridgette Habermann at [facility].  He lives with spouse, independent with a cane and a walker prior to admission.  Presented on May 27, 2017, with acute onset of left-sided weakness and slurred speech.  CT of the head showed hyperdense right MCA.  No hemorrhage or mass effect.  Subacute appearing left PCA territory infarct.  CT cerebral perfusion scan, emergent large vessel occlusion of the distal M1 segment of the right middle cerebral artery, underwent revascularization.  Echocardiogram with ejection fraction of 47%, grade 2 diastolic dysfunction.  MRI and MRA, acute MCA infarction, right insula, and frontal parietal lobe.  Small acute subacute infarct, left parietal lobe.  MRA with revascularization of occlusion.  Maintained on aspirin and Eliquis.  Resume 10 days post stroke  due to size of infarction.  He was on a dysphagia #1 thin thick liquid diet.  The patient was admitted for a comprehensive rehab program.  PAST MEDICAL HISTORY:  See discharge diagnoses.  SOCIAL HISTORY:  Lives with spouse.  Used a cane and a walker prior to admission, driving short distances.  FUNCTIONAL STATUS UPON ADMISSION TO REHAB SERVICES:  +2 physical assist sit to stand, +2 physical assist supine to sit, max total assist activities of daily living.  PHYSICAL EXAMINATION:  VITAL SIGNS:  Blood pressure 131/90, pulse 59, temperature 98, and respirations 16. NEUROLOGIC:  Alert male with some decrease insight to deficits, right gaze preference, followed simple commands.  Speech was dysarthric but intelligible. NECK:  Supple.  Nontender.  No JVD. CARDIAC:  Rate controlled. ABDOMEN:  Soft, nontender.  Good bowel sounds. LUNGS:  Clear to auscultation without wheeze. MUSCULOSKELETAL:  0/5 left deltoid biceps, triceps, grip 2- at the left hip extensor.  REHABILITATION AND HOSPITAL COURSE:  The patient was admitted to Inpatient Rehab Services.  Therapies initiated on a 3-hour daily basis, consisting of physical therapy, occupational therapy, speech therapy, and rehabilitation nursing.  The following issues were addressed during the patient's rehabilitation stay.  Pertaining to Mr. Bergen right MCA infarction, he had undergone revascularization of M1 segment occlusion.  His Eliquis had since been resumed 5 mg twice daily.  No bleeding episodes.  Blood pressure is controlled.  He had a history of atrial fibrillation with pacemaker followed by Dr. Bridgette Habermann at Cardiology Services at [facility]. His Tenormin was discontinued after being reduced due to bouts of bradycardia. Currently on a dysphagia #2  thin liquid diet.  Followup per speech therapy.  He remained on hormone supplement for hypothyroidism and Zocor for hyperlipidemia. Bouts of constipation resolved with  laxative assistance.  Mild chronic renal insufficiency, 1.31 and monitored.  The patient received weekly collaborative interdisciplinary team conferences to discuss estimated length of stay, family teaching, any barriers to his discharge.  He participated in sit to stand from elevated moderate assist.  Required max cues for improving posture and correcting to neutral position. He made significant improvements in sitting balance and upright tolerance through his therapy sessions. Wheelchair mobility, 125 feet, max assistance.  Performed sit to stand, transfers in his room to the toilet.  Activities of daily living and homemaking.  Demonstrated improved functional sitting balance.  Now able to transfer with steady, continued to be limited by dense left- sided weakness. Family could not provide the necessary assistance at home with skilled nursing facility becoming available 06/20/2017  DISCHARGE MEDICATIONS: 1. Eliquis 5 mg p.o. b.i.d. 2. Urecholine 25 mg p.o. t.i.d. 3. Xalatan ophthalmic solution 1 drop both eyes at bedtime. 4. Synthroid 112 mcg daily. 5. Lyrica 100 mg p.o. b.i.d. 6. Senokot-S tablets 2 p.o. b.i.d. 7. Zocor 40 mg p.o. at bedtime. 8. Flomax 0.4 mg daily. 9.Timoptic ophthalmic solution 1 drop left eye every morning. 10. Cipro 250 mg twice a day 2 more days 11. Flomax 0.4 mg daily 12.Lasix 20 mg daily as needed  DIET:  Dysphagia #1 thin liquids.  FOLLOWUP:  He would follow up with Dr. Alysia Penna at the [facility] as advised; Dr. Erlinda Hong, Neurology Services, call for appointment; Dr. Bridgette Habermann, Cardiology Services, at [facility].  Followup interventional Radiology after recent revascularization.     Lauraine Rinne, P.A.   ______________________________ Charlett Blake, M.D.    DA/MEDQ  D:  06/13/2017  T:  06/13/2017  Job:  [id number]  cc:   Charlett Blake, M.D.

## 2017-06-13 NOTE — Progress Notes (Signed)
Physical Therapy Session Note  Patient Details  Name: Derek Hays MRN: [id number] Date of Birth: [birth year]  Today's Date: 06/13/2017 PT Individual Time:1305-1400   65mn   Short Term Goals: Week 2:  PT Short Term Goal 1 (Week 2): Pt will maintain sitting balance with supervision assist x 10 minutes.  PT Short Term Goal 2 (Week 2): Pt will propell WC 1025fwith min assist  PT Short Term Goal 3 (Week 2): [facility]  PT Short Term Goal 4 (Week 2): Pt will perform bed mobility with mod assist   Skilled Therapeutic Interventions/Progress Updates:   Pt received sitting in WC and agreeable to PT.  Sit<>stand in stedy with min assist x 5. Sitting balance in stedy with min-mod assist.   2 x 1 min. Pt reports pain in gluteal region in sitting, causing unsafe sitting balance due to impulsivity.   WC mobility 3066f0ft with supervision assist. Max cues for coordination of RLE and RUE to maintain straight path and allow turn to the R.   Gait training at rail in hall 2 x 98f40fth max assist +2 with max multimodal cues for posture, sequencing, and gait pattern. PT required to stabilize the LLE and prevent adduction to widen BOS.   Pt returned to room and performed squat pivot transfer to bed with mod-max assist. Sit>supine completed with mod assist, and left supine in bed with call bell in reach and all needs met.       Therapy Documentation Precautions:  Precautions Precautions: Fall Precaution Comments:  neglect, severe L hemiparesis Restrictions Weight Bearing Restrictions: No Vital Signs: Therapy Vitals Temp: 97.9 F (36.6 C) Temp Source: Oral Pulse Rate: 60 Resp: 18 BP: 122/63 Patient Position (if appropriate): Lying Oxygen Therapy SpO2: 100 % O2 Device: Room Air   See Function Navigator for Current Functional Status.   Therapy/Group: Individual Therapy  AustLorie Phenix8/2019, 5:35 PM

## 2017-06-13 NOTE — Discharge Summary (Signed)
Discharge summary job [id number]

## 2017-06-13 NOTE — Progress Notes (Signed)
Subjective/Complaints: No issues overnight.  Working with speech therapy on water protocol  ROS: Appears to deny CP, SOB, nausea, vomiting, diarrhea  Objective: Vital Signs: Blood pressure 114/66, pulse 62, temperature 97.8 F (36.6 C), temperature source Oral, resp. rate 18, height 6' (1.829 m), weight 100.5 kg (221 lb 9 oz), SpO2 100 %. No results found. Results for orders placed or performed during the hospital encounter of 05/30/17 (from the past 72 hour(s))  Basic metabolic panel     Status: Abnormal   Collection Time: 06/13/17  7:14 AM  Result Value Ref Range   Sodium 142 135 - 145 mmol/L   Potassium 4.5 3.5 - 5.1 mmol/L   Chloride 106 101 - 111 mmol/L   CO2 25 22 - 32 mmol/L   Glucose, Bld 96 65 - 99 mg/dL   BUN 24 (H) 6 - 20 mg/dL   Creatinine, Ser 1.43 (H) 0.61 - 1.24 mg/dL   Calcium 9.1 8.9 - 10.3 mg/dL   GFR calc non Af Amer 43 (L) >60 mL/min   GFR calc Af Amer 50 (L) >60 mL/min    Comment: (NOTE) The eGFR has been calculated using the CKD EPI equation. This calculation has not been validated in all clinical situations. eGFR's persistently <60 mL/min signify possible Chronic Kidney Disease.    Anion gap 11 5 - 15    Comment: Performed at [facility] [address] [place], [place] [address]  CBC with Differential/Platelet     Status: None   Collection Time: 06/13/17  7:14 AM  Result Value Ref Range   WBC 7.5 4.0 - 10.5 K/uL   RBC 4.38 4.22 - 5.81 MIL/uL   Hemoglobin 13.2 13.0 - 17.0 g/dL   HCT 40.2 39.0 - 52.0 %   MCV 91.8 78.0 - 100.0 fL   MCH 30.1 26.0 - 34.0 pg   MCHC 32.8 30.0 - 36.0 g/dL   RDW 13.8 11.5 - 15.5 %   Platelets 232 150 - 400 K/uL   Neutrophils Relative % 65 %   Neutro Abs 4.9 1.7 - 7.7 K/uL   Lymphocytes Relative 21 %   Lymphs Abs 1.6 0.7 - 4.0 K/uL   Monocytes Relative 8 %   Monocytes Absolute 0.6 0.1 - 1.0 K/uL   Eosinophils Relative 5 %   Eosinophils Absolute 0.4 0.0 - 0.7 K/uL   Basophils Relative 1 %   Basophils  Absolute 0.0 0.0 - 0.1 K/uL    Comment: Performed at [facility], [place] [address] [place], [place] [address]     HENT: Normocephalic. Atraumatic. Eyes: No discharge. EOMI.  Cardio: RRR. No JVD  Resp: CTA Bilaterally. Normal effort   GI: BS positive and Nontender Skin:   Multiple ecchymoses left upper extremity Neuro: Alert/Oriented,  Motor: 0/5 left upper extremity biceps 2-/5 hip flexion, knee extensor, 0/5 distally Dysarthria Musc/Skel:  No edema. No tenderness. General no acute distress. Vital signs reviewed.   Assessment/Plan: 1. 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 Physiatrist is providing close team supervision and 24 hour management of active medical problems listed below. Physiatrist and rehab team continue to assess barriers to discharge/monitor patient progress toward functional and medical goals. FIM: Function - Bathing Position: Other (comment)(EOB UB, bedlevel LB) Body parts bathed by patient: Abdomen, Chest, Right upper leg Body parts bathed by  helper: Right arm, Left arm, Front perineal area, Buttocks, Left upper leg, Right lower leg, Left lower leg, Back Assist Level: 2 helpers  Function- Upper Body Dressing/Undressing What is the patient wearing?: Pull over shirt/dress Pull over shirt/dress - Perfomed by patient: Thread/unthread right sleeve, Put head through opening Pull over shirt/dress - Perfomed by helper: Thread/unthread left sleeve, Pull shirt over trunk Assist Level: Touching or steadying assistance(Pt > 75%) Function - Lower Body Dressing/Undressing What is the patient wearing?: Pants, Socks, Shoes Position: Wheelchair/chair at sink Pants- Performed by patient: Thread/unthread right pants leg Pants- Performed by helper: Thread/unthread right  pants leg, Thread/unthread left pants leg, Pull pants up/down Non-skid slipper socks- Performed by helper: Don/doff right sock, Don/doff left sock Socks - Performed by helper: Don/doff right sock, Don/doff left sock Shoes - Performed by helper: Don/doff right shoe, Don/doff left shoe, Fasten right, Fasten left Assist for footwear: Dependant Assist for lower body dressing: 2 Helpers(Standing in STEDY)  Function - Toileting Toileting activity did not occur: No continent bowel/bladder event Toileting steps completed by helper: Adjust clothing prior to toileting, Performs perineal hygiene, Adjust clothing after toileting Assist level: Two helpers  Function - Air cabin crew transfer activity did not occur: (not attempted) Toilet transfer assistive device: Bedside commode, Mechanical lift Mechanical lift: Maximove Assist level to bedside commode (at bedside): 2 helpers Assist level from bedside commode (at bedside): 2 helpers  Function - Chair/bed transfer Chair/bed transfer method: Lateral scoot Chair/bed transfer assist level: Maximal assist (Pt 25 - 49%/lift and lower) Chair/bed transfer assistive device: Armrests, Sliding board  Function - Locomotion: Wheelchair Will patient use wheelchair at discharge?: Yes Type: Manual Wheelchair activity did not occur: Safety/medical concerns Max wheelchair distance: 71f  Assist Level: Supervision or verbal cues Wheel 50 feet with 2 turns activity did not occur: Safety/medical concerns Assist Level: Supervision or verbal cues Wheel 150 feet activity did not occur: Safety/medical concerns Function - Locomotion: Ambulation Ambulation activity did not occur: Safety/medical concerns Assistive device: Parallel bars Max distance: 4 Assist level: Maximal assist (Pt 25 - 49%) Walk 10 feet activity did not occur: Safety/medical concerns Walk 50 feet with 2 turns activity did not occur: Safety/medical concerns Walk 150 feet activity did not  occur: Safety/medical concerns Walk 10 feet on uneven surfaces activity did not occur: Safety/medical concerns  Function - Comprehension Comprehension: Auditory Comprehension assistive device: Hearing aids Comprehension assist level: Understands basic 90% of the time/cues < 10% of the time  Function - Expression Expression: Verbal Expression assist level: Expresses basic 75 - 89% of the time/requires cueing 10 - 24% of the time. Needs helper to occlude trach/needs to repeat words.  Function - Social Interaction Social Interaction assist level: Interacts appropriately 90% of the time - Needs monitoring or encouragement for participation or interaction.  Function - Problem Solving Problem solving assist level: Solves basic 90% of the time/requires cueing < 10% of the time  Function - Memory Memory assist level: Recognizes or recalls 75 - 89% of the time/requires cueing 10 - 24% of the time Patient normally able to recall (first 3 days only): That he or she is in a hospital, Staff names and faces  Medical Problem List and Plan: 1.Left hemiparesis, left neglect, left hemisensory deficitssecondary to right MCA infarction/distal M1 segment occlusion status post revascularizationas well as history of traumatic SDH September 2019 with follow-up CT the head 05/10/2017 showed SDH completely reabsorbed.    Continue CIR    Notes reviewed, images reviewed, labs reviewed. 2. DVT  Prophylaxis/Anticoagulation:ELIQUIS   5 mg twice a day no signs of bleeding  3. Pain Management:Lyrica 100 mg twice a day, Tylenol as needed, no neuropathic pain c/os 4. Mood:Provide emotional support 5. Neuropsych: This patientis? Fully capable of making decisions on hisown behalf. 6. Skin/Wound Care:Routine skin checks 7. Fluids/Electrolytes/Nutrition:Routine I&O's  8.Dysphagia. Dysphagia #1 nectar liquids.  -continue to follow for tolerance    9.Hypertension. Tenormin 50 mg daily.  Fair control at  present Vitals:   06/12/17 1500 06/13/17 0100  BP: 120/75 114/66  Pulse: 60 62  Resp: 18 18  Temp: 98.1 F (36.7 C) 97.8 F (36.6 C)  SpO2: 98% 100%    Controlled 2/28 10.Atrial fibrillation with history of pacemaker. Cardiac rate controlled. Patient to follow-up with Dr[facility]. 11.Hypothyroidism. Synthroid 12.Hyperlipidemia. Zocor 13.  Constipation: Continue bowel regimen 14. CKD   Cr 1.31 on 2/18, 1.43 on 2/28   No sig change, enc fluids 15.  ABLA-resolved   Hb 12.1 on 2/15, improved to 13.2 on 2/28      LOS (Days) 14 A FACE TO FACE EVALUATION WAS PERFORMED  Charlett Blake 06/13/2017, 9:23 AM

## 2017-06-14 ENCOUNTER — Inpatient Hospital Stay (HOSPITAL_COMMUNITY): Payer: TRICARE For Life (TFL)

## 2017-06-14 ENCOUNTER — Inpatient Hospital Stay (HOSPITAL_COMMUNITY): Payer: TRICARE For Life (TFL) | Admitting: Physical Therapy

## 2017-06-14 ENCOUNTER — Inpatient Hospital Stay (HOSPITAL_COMMUNITY): Payer: TRICARE For Life (TFL) | Admitting: Speech Pathology

## 2017-06-14 NOTE — Plan of Care (Signed)
  Progressing RH BOWEL ELIMINATION RH STG MANAGE BOWEL WITH ASSISTANCE Description STG Manage Bowel with Mod Assistance.    06/14/2017 0158 - Progressing by Cornell Barman, RN RH STG MANAGE BOWEL W/MEDICATION W/ASSISTANCE Description STG Manage Bowel with Medication with Max Assistance.   06/14/2017 0158 - Progressing by Cornell Barman, RN RH BLADDER ELIMINATION RH STG MANAGE BLADDER WITH ASSISTANCE Description STG Manage Bladder With  Max Assistance  06/14/2017 0158 - Progressing by Cornell Barman, RN RH STG MANAGE BLADDER WITH MEDICATION WITH ASSISTANCE Description STG Manage Bladder With Medication With min Assistance.  06/14/2017 0158 - Progressing by Cornell Barman, RN RH STG MANAGE BLADDER WITH EQUIPMENT WITH ASSISTANCE Description STG Manage Bladder With Equipment With total Assistance  06/14/2017 0158 - Progressing by Cornell Barman, RN RH SKIN INTEGRITY RH STG SKIN FREE OF INFECTION/BREAKDOWN Description Skin remain free of breakdown with mod assist  06/14/2017 0158 - Progressing by Cornell Barman, RN RH STG MAINTAIN SKIN INTEGRITY WITH ASSISTANCE Description STG Maintain Skin Integrity With max Assistance.  06/14/2017 0158 - Progressing by Cornell Barman, RN RH SAFETY RH STG ADHERE TO SAFETY PRECAUTIONS W/ASSISTANCE/DEVICE Description STG Adhere to Safety Precautions With max  Assistance/Device.  06/14/2017 0158 - Progressing by Cornell Barman, RN RH STG DECREASED RISK OF FALL WITH ASSISTANCE Description STG Decreased Risk of Fall With max  Assistance.  06/14/2017 0158 - Progressing by Cornell Barman, RN RH STG DEMO UNDERSTANDING HOME SAFETY PRECAUTIONS Description Caregiver will be able to verbalize home safety prep precautions with min cues  06/14/2017 0158 - Progressing by Cornell Barman, RN

## 2017-06-14 NOTE — NC FL2 (Signed)
[place] MEDICAID FL2 LEVEL OF CARE SCREENING TOOL     IDENTIFICATION  Patient Name: Derek Hays Birthdate: [birth year] Sex: male Admission Date (Current Location): 05/30/2017  [facility] and [place] Number:  Herbalist and Address:  [facility], [place] [address] [place], [place] [address]      Provider Number: [id number]  Attending Physician Name and Address:  Charlett Blake, MD  Relative Name and Phone Number:  Rosa-wife [phone]    Current Level of Care: Other (Comment)(rehab) Recommended Level of Care: [place] Prior Approval Number:    Date Approved/Denied:   PASRR Number: [id number]  Discharge Plan: SNF    Current Diagnoses: Patient Active Problem List   Diagnosis Date Noted  . Acute blood loss anemia   . Stage 3 chronic kidney disease ([place])   . PAF (paroxysmal atrial fibrillation) ([place])   . Benign essential HTN   . Dysphagia, post-stroke   . 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 Stroke (cerebrum) ([place]) 05/27/2017    Orientation RESPIRATION BLADDER Height & Weight     Self, Time, Situation, Place  Normal Continent Weight: 221 lb 9 oz (100.5 kg) Height:  6' (182.9 cm)  BEHAVIORAL SYMPTOMS/MOOD NEUROLOGICAL BOWEL NUTRITION STATUS      Continent Diet(Dys 1 thin liquids)  AMBULATORY STATUS COMMUNICATION OF NEEDS Skin   Total Care Verbally Normal                       Personal Care Assistance Level of Assistance  Bathing, Feeding, Dressing Bathing Assistance: Limited assistance Feeding assistance: Limited assistance Dressing Assistance: Maximum assistance     Functional Limitations Info  Sight, Speech Sight Info: Impaired   Speech Info: Impaired    SPECIAL CARE FACTORS FREQUENCY  PT (By licensed PT), OT (By licensed OT), Speech therapy, Bowel and bladder program     PT Frequency: 5x week OT Frequency: 5x  week Bowel and Bladder Program Frequency: Timed tolieting-not on bladder meds now and getting in the bathroom to void   Speech Therapy Frequency: 5x week      Contractures Contractures Info: Not present    Additional Factors Info  Code Status, Allergies Code Status Info: Full Code Allergies Info: No Known Allergies           Current Medications (06/14/2017):  This is the current hospital active medication list Current Facility-Administered Medications  Medication Dose Route Frequency Provider Last Rate Last Dose  . acetaminophen (TYLENOL) tablet 650 mg  650 mg Oral Q4H PRN Cathlyn Parsons, PA-C   650 mg at 06/12/17 2033   Or  . acetaminophen (TYLENOL) solution 650 mg  650 mg Per Tube Q4H PRN Cathlyn Parsons, PA-C   650 mg at 06/11/17 6948   Or  . acetaminophen (TYLENOL) suppository 650 mg  650 mg Rectal Q4H PRN Angiulli, Lavon Paganini, PA-C      . apixaban (ELIQUIS) tablet 5 mg  5 mg Oral BID Charlett Blake, MD   5 mg at 06/14/17 5462  . atenolol (TENORMIN) tablet 50 mg  50 mg Oral Daily Cathlyn Parsons, PA-C   50 mg at 06/14/17 7035  . bethanechol (URECHOLINE) tablet 5 mg  5 mg Oral TID Charlett Blake, MD   5 mg at 06/14/17 0830  . bisacodyl (DULCOLAX) suppository 10 mg  10 mg Rectal Daily PRN Meredith Staggers, MD  10 mg at 06/10/17 1225  . furosemide (LASIX) tablet 20 mg  20 mg Oral Daily PRN Cathlyn Parsons, PA-C   20 mg at 06/11/17 2255  . latanoprost (XALATAN) 0.005 % ophthalmic solution 1 drop  1 drop Both Eyes QHS Angiulli, Lavon Paganini, PA-C   1 drop at 06/13/17 2121  . levothyroxine (SYNTHROID, LEVOTHROID) tablet 112 mcg  112 mcg Oral QAC breakfast Cathlyn Parsons, PA-C   112 mcg at 06/14/17 9794  . ondansetron (ZOFRAN) tablet 4 mg  4 mg Oral Q6H PRN Angiulli, Lavon Paganini, PA-C       Or  . ondansetron [facility]) injection 4 mg  4 mg Intravenous Q6H PRN Angiulli, Lavon Paganini, PA-C      . polyvinyl alcohol (LIQUIFILM TEARS) 1.4 % ophthalmic solution 1 drop  1 drop  Both Eyes Q6H PRN Angiulli, Lavon Paganini, PA-C      . pregabalin (LYRICA) capsule 100 mg  100 mg Oral BID Cathlyn Parsons, PA-C   100 mg at 06/14/17 8016  . RESOURCE THICKENUP CLEAR   Oral PRN Angiulli, Lavon Paganini, PA-C      . senna-docusate (Senokot-S) tablet 2 tablet  2 tablet Oral BID Charlett Blake, MD   2 tablet at 06/14/17 0830  . simvastatin (ZOCOR) tablet 40 mg  40 mg Oral QHS Cathlyn Parsons, PA-C   40 mg at 06/13/17 2121  . sorbitol 70 % solution 30 mL  30 mL Oral Daily PRN Cathlyn Parsons, PA-C   30 mL at 06/05/17 2155  . tamsulosin (FLOMAX) capsule 0.4 mg  0.4 mg Oral QPC supper Cathlyn Parsons, PA-C   0.4 mg at 06/13/17 1704  . timolol (TIMOPTIC-XR) 0.5 % ophthalmic gel-forming 1 drop  1 drop Left Eye q morning - 10a Angiulli, Lavon Paganini, PA-C   1 drop at 06/14/17 1031     Discharge Medications: Please see discharge summary for a list of discharge medications.  Relevant Imaging Results:  Relevant Lab Results:   Additional Information SSN: [id number] Moved here from [place] lives in [place], [place], [place]

## 2017-06-14 NOTE — Progress Notes (Signed)
Occupational Therapy Session Note  Patient Details  Name: Derek Hays MRN: [id number] Date of Birth: [birth year]  Today's Date: 06/14/2017 OT Individual Time: 3403-7096 OT Individual Time Calculation (min): 70 min   Skilled Therapeutic Interventions/Progress Updates:    1:1. Pt reporting need to toilet. Pt suping>sitting with MAX A. Pt sit to stand in stedy throughout session with MIN A and VC for weight shifting R. Pt completes toilet transfer in stedy and remains seated with stedy surrounding him for sitting balance. Pt requires increased time to void bladder and unsuccessful for BM. Pt groom at sink locating all grooming items on L side with min question cues. Pt shaves face with VC to apply shaving cream to L side of face. Pt dons pull over shirt with overall MAX A. Pt dons pants and shoes with MAX A and sit to stand with MOD A +2 for lifting and L knee block and VC for upright posture as +2 advances pants past hips. Exited session with pt seated in w/c tilted back, call light in reach and QRB donned.  Therapy Documentation Precautions:  Precautions Precautions: Fall Precaution Comments:  neglect, severe L hemiparesis Restrictions Weight Bearing Restrictions: No  See Function Navigator for Current Functional Status.   Therapy/Group: Individual Therapy  Tonny Branch 06/14/2017, 10:46 AM

## 2017-06-14 NOTE — Progress Notes (Signed)
ANTICOAGULATION CONSULT NOTE - Follow Up Consult  Pharmacy Consult for Eliquis Indication: stroke  No Known Allergies  Patient Measurements: Height: 6' (182.9 cm) Weight: 221 lb 9 oz (100.5 kg) IBW/kg (Calculated) : 77.6  Vital Signs: Temp: 97.5 F (36.4 C) (03/01 0300) Temp Source: Oral (03/01 0300) BP: 120/67 (03/01 0300) Pulse Rate: 61 (03/01 0300)   Assessment: 84 year old male started on Eliquis on 06/10/17 for Afib s/p stroke. No signs of bleeding noted. SCr 1.43 slightly increased from prior 1.31.  Weight 100.5kg.  Current Eliquis 5 mg bid is appropriate dosing.    Goal of Therapy:  Monitor platelets by anticoagulation protocol: Yes   Plan:  Continue Eliquis 5 mg po BID  Thank you Nicole Cella, RPh Clinical Pharmacist Pager: [phone] Phone: [phone] 3:30p-1030p [phone] or [phone] or call main pharmacy [phone] 06/14/2017,1:31 PM

## 2017-06-14 NOTE — Progress Notes (Signed)
Speech Language Pathology Daily Session Note  Patient Details  Name: Derek Hays MRN: [id number] Date of Birth: 1930/11/17  Today's Date: 06/14/2017 SLP Individual Time: 1105-1200 SLP Individual Time Calculation (min): 55 min  Short Term Goals: Week 3: SLP Short Term Goal 1 (Week 3): Pt will consume trials of dysphagia 2 textures with Mod A cues for use of compensatory swallow strategies to demonstrate readiness for diet upgrade.  SLP Short Term Goal 2 (Week 3): Pt will consume trials of thin liquids with minimal overt s/s of aspiration with Min A cues for compensatory swallow strategies.  SLP Short Term Goal 3 (Week 3): Pt will demonstrate intellectual awareness by identifying 2 physical and 2 cognitive deficits related to new CVA with Mod A cues.  SLP Short Term Goal 4 (Week 3): Pt will scan to midline to locate objects in 50% of opportunities and Mod A cues.  SLP Short Term Goal 5 (Week 3): Pt will complete basic  to semi complex tasks with Min A cues.  Skilled Therapeutic Interventions:  Pt was seen for skilled ST targeting goals for cognition and dysphagia.  SLP facilitated the session with trials of thin liquids to continue working towards diet progression.  Pt demonstrated what appeared to be timely swallow initiation with no overt s/s of aspiration.  He needed min verbal cues to monitor and correct anterior spillage of liquids from the oral cavity due to decreased labial seal and decreased sensation.  Recommend that pt be advanced to thin liquids with pureed textures and full supervision for use of swallowing precautions. SLP also facilitated the session with a familiar yet simplified card game to address problem solving.  Pt completed task with min assist to plan and execute a problem solving strategy due to decreased working memory of task rules and procedures as well as slowed processing.  Pt was left in wheelchair with call bell within reach.  Continue per current plan of care.      Function:  Eating Eating   Modified Consistency Diet: Yes Eating Assist Level: Set up assist for;Supervision or verbal cues   Eating Set Up Assist For: Opening containers       Cognition Comprehension Comprehension assist level: Understands basic 90% of the time/cues < 10% of the time  Expression   Expression assist level: Expresses basic 75 - 89% of the time/requires cueing 10 - 24% of the time. Needs helper to occlude trach/needs to repeat words.  Social Interaction Social Interaction assist level: Interacts appropriately 90% of the time - Needs monitoring or encouragement for participation or interaction.  Problem Solving Problem solving assist level: Solves basic 50 - 74% of the time/requires cueing 25 - 49% of the time  Memory Memory assist level: Recognizes or recalls 75 - 89% of the time/requires cueing 10 - 24% of the time    Pain Pain Assessment Pain Assessment: No/denies pain  Therapy/Group: Individual Therapy  Atziri Zubiate, Selinda Orion 06/14/2017, 12:51 PM

## 2017-06-14 NOTE — Progress Notes (Signed)
Physical Therapy Session Note  Patient Details  Name: Derek Hays MRN: [id number] Date of Birth: Apr 11, 1931  Today's Date: 06/14/2017 PT Individual Time: 1405-1501 PT Individual Time Calculation (min): 56 min   Short Term Goals: Week 1:  PT Short Term Goal 1 (Week 1): Pt will perform sit<>supine with max assist  PT Short Term Goal 1 - Progress (Week 1): Met PT Short Term Goal 2 (Week 1): Pt will maintain sitting balance x 1 min with mod assist  PT Short Term Goal 2 - Progress (Week 1): Met PT Short Term Goal 3 (Week 1): Pt will initiate WC mobility  PT Short Term Goal 3 - Progress (Week 1): Progressing toward goal PT Short Term Goal 4 (Week 1): Pt will transfer to [facility] with max assist of 1  PT Short Term Goal 4 - Progress (Week 1): Met Week 2:  PT Short Term Goal 1 (Week 2): Pt will maintain sitting balance with supervision assist x 10 minutes.  PT Short Term Goal 2 (Week 2): Pt will propell WC 153f with min assist  PT Short Term Goal 3 (Week 2): [facility]  PT Short Term Goal 4 (Week 2): Pt will perform bed mobility with mod assist   Skilled Therapeutic Interventions/Progress Updates:    Pt received sitting in WC and agreeable to PT  Squat pivot transfers to and from Nustep as well as to to bed with mod-max assist from PT with max cues for sequencing and set up.    Nustep reciprocal movement training x 5 min+4 min with min assist progressing to supervision assist to maintain neutral midline position.   Sitting balance EOB x 5 minutes with min-supervision assist. Pt continues to required assist to prevent L lateral LOB with reaches to the L.   WC mobility with R hemi technique and min-supervision assist. Max cues for proper coordination of the RUE and RLE to maintain straight path. .   Pt returned to room and performed squat pivot transfer to bed with mod assist as listed above. Sit>supine completed with mod assist. Pt left supine in bed with call  bell in reach and all needs met.       Therapy Documentation Precautions:  Precautions Precautions: Fall Precaution Comments:  neglect, severe L hemiparesis Restrictions Weight Bearing Restrictions: No    Vital Signs: Therapy Vitals Pulse Rate: 62 Resp: 16 BP: 114/69 Patient Position (if appropriate): Sitting Oxygen Therapy SpO2: 99 % O2 Device: Room Air Pain: Denies.   See Function Navigator for Current Functional Status.   Therapy/Group: Individual Therapy  ALorie Phenix3/04/2017, 5:27 PM

## 2017-06-14 NOTE — Progress Notes (Signed)
Subjective/Complaints: Left shoulder pain with movement.  No fall or trauma to that area  ROS: Appears to deny CP, SOB, nausea, vomiting, diarrhea  Objective: Vital Signs: Blood pressure 120/67, pulse 61, temperature (!) 97.5 F (36.4 C), temperature source Oral, resp. rate 18, height 6' (1.829 m), weight 100.5 kg (221 lb 9 oz), SpO2 98 %. No results found. Results for orders placed or performed during the hospital encounter of 05/30/17 (from the past 72 hour(s))  Basic metabolic panel     Status: Abnormal   Collection Time: 06/13/17  7:14 AM  Result Value Ref Range   Sodium 142 135 - 145 mmol/L   Potassium 4.5 3.5 - 5.1 mmol/L   Chloride 106 101 - 111 mmol/L   CO2 25 22 - 32 mmol/L   Glucose, Bld 96 65 - 99 mg/dL   BUN 24 (H) 6 - 20 mg/dL   Creatinine, Ser 1.43 (H) 0.61 - 1.24 mg/dL   Calcium 9.1 8.9 - 10.3 mg/dL   GFR calc non Af Amer 43 (L) >60 mL/min   GFR calc Af Amer 50 (L) >60 mL/min    Comment: (NOTE) The eGFR has been calculated using the CKD EPI equation. This calculation has not been validated in all clinical situations. eGFR's persistently <60 mL/min signify possible Chronic Kidney Disease.    Anion gap 11 5 - 15    Comment: Performed at [facility] [address], [place], [place] [address]  CBC with Differential/Platelet     Status: None   Collection Time: 06/13/17  7:14 AM  Result Value Ref Range   WBC 7.5 4.0 - 10.5 K/uL   RBC 4.38 4.22 - 5.81 MIL/uL   Hemoglobin 13.2 13.0 - 17.0 g/dL   HCT 40.2 39.0 - 52.0 %   MCV 91.8 78.0 - 100.0 fL   MCH 30.1 26.0 - 34.0 pg   MCHC 32.8 30.0 - 36.0 g/dL   RDW 13.8 11.5 - 15.5 %   Platelets 232 150 - 400 K/uL   Neutrophils Relative % 65 %   Neutro Abs 4.9 1.7 - 7.7 K/uL   Lymphocytes Relative 21 %   Lymphs Abs 1.6 0.7 - 4.0 K/uL   Monocytes Relative 8 %   Monocytes Absolute 0.6 0.1 - 1.0 K/uL   Eosinophils Relative 5 %   Eosinophils Absolute 0.4 0.0 - 0.7 K/uL   Basophils Relative 1 %   Basophils  Absolute 0.0 0.0 - 0.1 K/uL    Comment: Performed at [facility] [address], [place], [place] [address]     HENT: Normocephalic. Atraumatic. Eyes: No discharge. EOMI.  Cardio: RRR. No JVD  Resp: CTA Bilaterally. Normal effort   GI: BS positive and Nontender Skin:   Multiple ecchymoses left upper extremity Neuro: Alert/Oriented,  Motor: 0/5 left upper extremity biceps 2-/5 hip flexion, knee extensor, 0/5 distally Dysarthria Musc/Skel:  No edema. No tenderness. General no acute distress. Vital signs reviewed.   Assessment/Plan: 1. 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 Physiatrist is providing close team supervision and 24 hour management of active medical problems listed below. Physiatrist and rehab team continue to assess barriers to discharge/monitor patient progress toward functional and medical goals. FIM: Function - Bathing Position: Other (comment)(EOB UB, bedlevel LB) Body parts bathed by patient: Abdomen, Chest, Right upper leg Body  parts bathed by helper: Right arm, Left arm, Front perineal area, Buttocks, Left upper leg, Right lower leg, Left lower leg, Back Assist Level: 2 helpers  Function- Upper Body Dressing/Undressing What is the patient wearing?: Pull over shirt/dress Pull over shirt/dress - Perfomed by patient: Thread/unthread right sleeve, Put head through opening Pull over shirt/dress - Perfomed by helper: Thread/unthread left sleeve, Pull shirt over trunk Assist Level: Touching or steadying assistance(Pt > 75%) Function - Lower Body Dressing/Undressing What is the patient wearing?: Pants, Socks Position: Wheelchair/chair at sink Pants- Performed by patient: Thread/unthread right pants leg Pants- Performed by helper: Thread/unthread right pants  leg, Thread/unthread left pants leg, Pull pants up/down Non-skid slipper socks- Performed by helper: Don/doff right sock, Don/doff left sock Socks - Performed by helper: Don/doff right sock, Don/doff left sock Shoes - Performed by helper: Don/doff right shoe, Don/doff left shoe, Fasten right, Fasten left Assist for footwear: Dependant Assist for lower body dressing: 2 Helpers(Standing in STEDY)  Function - Toileting Toileting activity did not occur: No continent bowel/bladder event Toileting steps completed by helper: Adjust clothing prior to toileting, Performs perineal hygiene, Adjust clothing after toileting(Simultaneous filing. User may not have seen previous data.) Assist level: Two helpers  Function - Air cabin crew transfer activity did not occur: Safety/medical concerns Toilet transfer assistive device: Elevated toilet seat/BSC over toilet, Mechanical lift(Simultaneous filing. User may not have seen previous data.) Mechanical lift: Sara(Simultaneous filing. User may not have seen previous data.) Assist level to toilet: 2 helpers Assist level from toilet: 2 helpers Assist level to bedside commode (at bedside): 2 helpers Assist level from bedside commode (at bedside): 2 helpers  Function - Chair/bed transfer Chair/bed transfer method: Lateral scoot Chair/bed transfer assist level: Maximal assist (Pt 25 - 49%/lift and lower) Chair/bed transfer assistive device: Armrests, Sliding board  Function - Locomotion: Wheelchair Will patient use wheelchair at discharge?: Yes Type: Manual Wheelchair activity did not occur: Safety/medical concerns Max wheelchair distance: 9f  Assist Level: Supervision or verbal cues Wheel 50 feet with 2 turns activity did not occur: Safety/medical concerns Assist Level: Supervision or verbal cues Wheel 150 feet activity did not occur: Safety/medical concerns Function - Locomotion: Ambulation Ambulation activity did not occur: Safety/medical  concerns Assistive device: Parallel bars Max distance: 4 Assist level: Maximal assist (Pt 25 - 49%) Walk 10 feet activity did not occur: Safety/medical concerns Walk 50 feet with 2 turns activity did not occur: Safety/medical concerns Walk 150 feet activity did not occur: Safety/medical concerns Walk 10 feet on uneven surfaces activity did not occur: Safety/medical concerns  Function - Comprehension Comprehension: Auditory Comprehension assistive device: Hearing aids Comprehension assist level: Understands basic 75 - 89% of the time/ requires cueing 10 - 24% of the time, Understands basic 50 - 74% of the time/ requires cueing 25 - 49% of the time  Function - Expression Expression: Verbal Expression assist level: Expresses basic 75 - 89% of the time/requires cueing 10 - 24% of the time. Needs helper to occlude trach/needs to repeat words.  Function - Social Interaction Social Interaction assist level: Interacts appropriately with others with medication or extra time (anti-anxiety, antidepressant).  Function - Problem Solving Problem solving assist level: Solves basic 50 - 74% of the time/requires cueing 25 - 49% of the time  Function - Memory Memory assist level: Recognizes or recalls 75 - 89% of the time/requires cueing 10 - 24% of the time Patient normally able to recall (first 3 days only): That he or she is in a hospital,  Staff names and faces, Current season  Medical Problem List and Plan: 1.Left hemiparesis, left neglect, left hemisensory deficitssecondary to right MCA infarction/distal M1 segment occlusion status post revascularizationas well as history of traumatic SDH September 2019 with follow-up CT the head 05/10/2017 showed SDH completely reabsorbed.    Continue CIR PT, OT, speech, awaiting SNF placement    2. DVT Prophylaxis/Anticoagulation:ELIQUIS   5 mg twice a day no signs of bleeding  3. Pain Management:Lyrica 100 mg twice a day, Tylenol as needed, no neuropathic  pain c/os 4. Mood:Provide emotional support 5. Neuropsych: This patientis? Fully capable of making decisions on hisown behalf. 6. Skin/Wound Care:Routine skin checks 7. Fluids/Electrolytes/Nutrition:Routine I&O's  8.Dysphagia. Dysphagia #1 nectar liquids.  -Intake is 768 mL yesterday improving    9.Hypertension. Tenormin 50 mg daily.  Fair control at present Vitals:   06/13/17 1513 06/14/17 0300  BP: 122/63 120/67  Pulse: 60 61  Resp: 18 18  Temp: 97.9 F (36.6 C) (!) 97.5 F (36.4 C)  SpO2: 100% 98%    Controlled 3/1 10.Atrial fibrillation with history of pacemaker. Cardiac rate controlled. Patient to follow-up with Dr.Fargencardiology services [facility] 11.Hypothyroidism. Synthroid 12.Hyperlipidemia. Zocor 13.  Constipation: Continue bowel regimen 14. CKD   Cr 1.31 on 2/18, 1.43 on 2/28   No sig change, enc fluids       LOS (Days) 15 A FACE TO FACE EVALUATION WAS PERFORMED  Charlett Blake 06/14/2017, 8:23 AM

## 2017-06-15 ENCOUNTER — Inpatient Hospital Stay (HOSPITAL_COMMUNITY): Payer: TRICARE For Life (TFL) | Admitting: Physical Therapy

## 2017-06-15 LAB — URINALYSIS, ROUTINE W REFLEX MICROSCOPIC
BILIRUBIN URINE: NEGATIVE
Glucose, UA: NEGATIVE mg/dL
Ketones, ur: NEGATIVE mg/dL
NITRITE: POSITIVE — AB
PH: 5 (ref 5.0–8.0)
Protein, ur: NEGATIVE mg/dL
SPECIFIC GRAVITY, URINE: 1.016 (ref 1.005–1.030)
Squamous Epithelial / LPF: NONE SEEN

## 2017-06-15 MED ORDER — SENNOSIDES 8.8 MG/5ML PO SYRP
10.0000 mL | ORAL_SOLUTION | Freq: Two times a day (BID) | ORAL | Status: DC
  Administered 2017-06-15 – 2017-06-20 (×10): 10 mL via ORAL
  Filled 2017-06-15 (×10): qty 10

## 2017-06-15 NOTE — Progress Notes (Signed)
Physical Therapy Session Note  Patient Details  Name: Derek Hays MRN: [id number] Date of Birth: [birth year]  Today's Date: 06/15/2017 PT Individual Time: 1545-1630 PT Individual Time Calculation (min): 45 min   Short Term Goals: Week 1:  PT Short Term Goal 1 (Week 1): Pt will perform sit<>supine with max assist  PT Short Term Goal 1 - Progress (Week 1): Met PT Short Term Goal 2 (Week 1): Pt will maintain sitting balance x 1 min with mod assist  PT Short Term Goal 2 - Progress (Week 1): Met PT Short Term Goal 3 (Week 1): Pt will initiate WC mobility  PT Short Term Goal 3 - Progress (Week 1): Progressing toward goal PT Short Term Goal 4 (Week 1): Pt will transfer to [facility] with max assist of 1  PT Short Term Goal 4 - Progress (Week 1): Met Week 2:  PT Short Term Goal 1 (Week 2): Pt will maintain sitting balance with supervision assist x 10 minutes.  PT Short Term Goal 2 (Week 2): Pt will propell WC 156f with min assist  PT Short Term Goal [facility] (Week 2): [facility]  PT Short Term Goal 4 (Week 2): Pt will perform bed mobility with mod assist   Skilled Therapeutic Interventions/Progress Updates:    Pt received supine in bed and agreeable to PT. Supine>sit transfer with mod assist.   Sitting balance EOB with min progressing to supervision assist.   Scooting EOB with mod-max assist. Sit<>stand from EOB x 10 with standing balance tolerance up to 1 minute.     LUE NMR shoulder flexion/extention with AAROM within paine free ROM. Pt noted to have improved activation with increased repetitions.  Sit>supine completed with mod assist and left supine in bed with call bell in reach and all needs met.   .        Therapy Documentation Precautions:  Precautions Precautions: Fall Precaution Comments:  neglect, severe L hemiparesis Restrictions Weight Bearing Restrictions: No Pain: denies.   See Function Navigator for Current Functional Status.   Therapy/Group:  Individual Therapy  ALorie Phenix3/05/2017, 5:22 PM

## 2017-06-15 NOTE — Plan of Care (Signed)
  Not Progressing RH BOWEL ELIMINATION RH STG MANAGE BOWEL WITH ASSISTANCE Description STG Manage Bowel with Mod Assistance.    06/15/2017 1106 - Not Progressing by Hillery Jacks, RN-requires max assist  RH STG MANAGE BOWEL W/MEDICATION W/ASSISTANCE Description STG Manage Bowel with Medication with Max Assistance.   06/15/2017 1106 - Not Progressing by Taressa Rauh, Renato Gails, RN

## 2017-06-15 NOTE — Progress Notes (Signed)
Subjective/Complaints: Patient without new complaints today.  ROS: Appears to deny CP, SOB, nausea, vomiting, diarrhea  Objective: Vital Signs: Blood pressure 109/67, pulse 60, temperature 98.5 F (36.9 C), temperature source Oral, resp. rate 18, height 6' (1.829 m), weight 100.5 kg (221 lb 9 oz), SpO2 98 %. No results found. Results for orders placed or performed during the hospital encounter of 05/30/17 (from the past 72 hour(s))  Basic metabolic panel     Status: Abnormal   Collection Time: 06/13/17  7:14 AM  Result Value Ref Range   Sodium 142 135 - 145 mmol/L   Potassium 4.5 3.5 - 5.1 mmol/L   Chloride 106 101 - 111 mmol/L   CO2 25 22 - 32 mmol/L   Glucose, Bld 96 65 - 99 mg/dL   BUN 24 (H) 6 - 20 mg/dL   Creatinine, Ser 1.43 (H) 0.61 - 1.24 mg/dL   Calcium 9.1 8.9 - 10.3 mg/dL   GFR calc non Af Amer 43 (L) >60 mL/min   GFR calc Af Amer 50 (L) >60 mL/min    Comment: (NOTE) The eGFR has been calculated using the CKD EPI equation. This calculation has not been validated in all clinical situations. eGFR's persistently <60 mL/min signify possible Chronic Kidney Disease.    Anion gap 11 5 - 15    Comment: Performed at [facility] [address], [place], [place] [phone]  CBC with Differential/Platelet     Status: None   Collection Time: 06/13/17  7:14 AM  Result Value Ref Range   WBC 7.5 4.0 - 10.5 K/uL   RBC 4.38 4.22 - 5.81 MIL/uL   Hemoglobin 13.2 13.0 - 17.0 g/dL   HCT 40.2 39.0 - 52.0 %   MCV 91.8 78.0 - 100.0 fL   MCH 30.1 26.0 - 34.0 pg   MCHC 32.8 30.0 - 36.0 g/dL   RDW 13.8 11.5 - 15.5 %   Platelets 232 150 - 400 K/uL   Neutrophils Relative % 65 %   Neutro Abs 4.9 1.7 - 7.7 K/uL   Lymphocytes Relative 21 %   Lymphs Abs 1.6 0.7 - 4.0 K/uL   Monocytes Relative 8 %   Monocytes Absolute 0.6 0.1 - 1.0 K/uL   Eosinophils Relative 5 %   Eosinophils Absolute 0.4 0.0 - 0.7 K/uL   Basophils Relative 1 %   Basophils Absolute 0.0 0.0 - 0.1 K/uL   Comment: Performed at [facility], [place] [address] [place], [place] [address]     HENT: Normocephalic. Atraumatic. Eyes: No discharge. EOMI.  Cardio: RRR. No JVD  Resp: CTA Bilaterally. Normal effort   GI: BS positive and Nontender Skin:   Multiple ecchymoses left upper extremity Neuro: Alert/Oriented,  Motor: 0/5 left upper extremity biceps 2-/5 hip flexion, knee extensor, 0/5 distally Dysarthria Musc/Skel:  No edema. No tenderness. General no acute distress. Vital signs reviewed.   Assessment/Plan: 1. 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 Physiatrist is providing close team supervision and 24 hour management of active medical problems listed below. Physiatrist and rehab team continue to assess barriers to discharge/monitor patient progress toward functional and medical goals. FIM: Function - Bathing Position: Other (comment)(EOB UB, bedlevel LB) Body parts bathed by patient: Abdomen, Chest, Right upper leg Body parts bathed by helper: Right arm, Left arm, Front perineal area,  Buttocks, Left upper leg, Right lower leg, Left lower leg, Back Assist Level: 2 helpers  Function- Upper Body Dressing/Undressing What is the patient wearing?: Pull over shirt/dress Pull over shirt/dress - Perfomed by patient: Thread/unthread right sleeve, Put head through opening Pull over shirt/dress - Perfomed by helper: Thread/unthread left sleeve, Pull shirt over trunk Assist Level: Touching or steadying assistance(Pt > 75%) Function - Lower Body Dressing/Undressing What is the patient wearing?: Pants, Socks Position: Wheelchair/chair at sink Pants- Performed by patient: Thread/unthread right pants leg Pants- Performed by helper: Thread/unthread right pants leg, Thread/unthread left pants  leg, Pull pants up/down Non-skid slipper socks- Performed by helper: Don/doff right sock, Don/doff left sock Socks - Performed by helper: Don/doff right sock, Don/doff left sock Shoes - Performed by helper: Don/doff right shoe, Don/doff left shoe, Fasten right, Fasten left Assist for footwear: Dependant Assist for lower body dressing: 2 Helpers(Standing in STEDY)  Function - Toileting Toileting activity did not occur: No continent bowel/bladder event Toileting steps completed by helper: Adjust clothing prior to toileting, Performs perineal hygiene, Adjust clothing after toileting Toileting Assistive Devices: Other (comment)(stedy) Assist level: Two helpers  Function - Air cabin crew transfer activity did not occur: Safety/medical concerns Toilet transfer assistive device: Elevated toilet seat/BSC over toilet, Mechanical lift Mechanical lift: Stedy Assist level to toilet: 2 helpers Assist level from toilet: 2 helpers Assist level to bedside commode (at bedside): 2 helpers Assist level from bedside commode (at bedside): 2 helpers  Function - Chair/bed transfer Chair/bed transfer method: Lateral scoot Chair/bed transfer assist level: Maximal assist (Pt 25 - 49%/lift and lower) Chair/bed transfer assistive device: Armrests, Sliding board  Function - Locomotion: Wheelchair Will patient use wheelchair at discharge?: Yes Type: Manual Wheelchair activity did not occur: Safety/medical concerns Max wheelchair distance: 21f  Assist Level: Supervision or verbal cues Wheel 50 feet with 2 turns activity did not occur: Safety/medical concerns Assist Level: Supervision or verbal cues Wheel 150 feet activity did not occur: Safety/medical concerns Function - Locomotion: Ambulation Ambulation activity did not occur: Safety/medical concerns Assistive device: Parallel bars Max distance: 4 Assist level: Maximal assist (Pt 25 - 49%) Walk 10 feet activity did not occur: Safety/medical  concerns Walk 50 feet with 2 turns activity did not occur: Safety/medical concerns Walk 150 feet activity did not occur: Safety/medical concerns Walk 10 feet on uneven surfaces activity did not occur: Safety/medical concerns  Function - Comprehension Comprehension: Auditory Comprehension assistive device: Hearing aids Comprehension assist level: Understands basic 90% of the time/cues < 10% of the time  Function - Expression Expression: Verbal Expression assist level: Expresses basic 75 - 89% of the time/requires cueing 10 - 24% of the time. Needs helper to occlude trach/needs to repeat words.  Function - Social Interaction Social Interaction assist level: Interacts appropriately 90% of the time - Needs monitoring or encouragement for participation or interaction.  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  Continue CIR PT, OT, speech,     2. DVT Prophylaxis/Anticoagulation:ELIQUIS   5 mg twice a day no signs of bleeding  3. Pain Management:Lyrica 100 mg twice a day, Tylenol as needed, no neuropathic pain c/os 4. Mood:Provide emotional support 5. Neuropsych: This patientis? Fully capable of making decisions on hisown behalf. 6. Skin/Wound Care:Routine skin checks 7. Fluids/Electrolytes/Nutrition:Routine I&O's  8.Dysphagia. Dysphagia #1 nectar  liquids.  -Intake is 720 mL yesterday improving    9.Hypertension. Tenormin 50 mg daily.  Fair control at present Vitals:   06/15/17 0130 06/15/17 0946  BP: (!) 98/55 109/67  Pulse:  60  Resp:    Temp:    SpO2:      Controlled 3/2 10.Atrial fibrillation with history of pacemaker. Cardiac rate controlled. Patient to follow-up with Dr.Fargencardiology services [facility]. 11.Hypothyroidism. Synthroid 12.Hyperlipidemia. Zocor 13.  Constipation: Continue bowel regimen 14. CKD   Cr 1.31 on 2/18, 1.43 on 2/28   No sig change, enc fluids       LOS (Days) 16 A FACE TO FACE EVALUATION WAS PERFORMED  Charlett Blake 06/15/2017, 11:16 AM

## 2017-06-15 NOTE — Plan of Care (Deleted)
  RH BOWEL ELIMINATION RH STG MANAGE BOWEL WITH ASSISTANCE Description STG Manage Bowel with Mod Assistance.    06/15/2017 0115 - Not Progressing by Dorice Lamas, RN   RH BOWEL ELIMINATION RH STG MANAGE BOWEL W/MEDICATION W/ASSISTANCE Description STG Manage Bowel with Medication with Max Assistance.   06/15/2017 0115 - Not Progressing by Dorice Lamas, RN  LBM 06-12-17

## 2017-06-16 MED ORDER — BETHANECHOL CHLORIDE 10 MG PO TABS
10.0000 mg | ORAL_TABLET | Freq: Three times a day (TID) | ORAL | Status: DC
  Administered 2017-06-16 – 2017-06-19 (×9): 10 mg via ORAL
  Filled 2017-06-16 (×9): qty 1

## 2017-06-16 MED ORDER — CEPHALEXIN 250 MG PO CAPS
250.0000 mg | ORAL_CAPSULE | Freq: Three times a day (TID) | ORAL | Status: DC
  Administered 2017-06-16 – 2017-06-17 (×4): 250 mg via ORAL
  Filled 2017-06-16 (×4): qty 1

## 2017-06-16 NOTE — Plan of Care (Signed)
  Not Progressing RH BOWEL ELIMINATION RH STG MANAGE BOWEL WITH ASSISTANCE Description STG Manage Bowel with Mod Assistance.    06/16/2017 0200 - Not Progressing by Cornell Barman, RN RH STG MANAGE BOWEL W/MEDICATION W/ASSISTANCE Description STG Manage Bowel with Medication with Max Assistance.   06/16/2017 0200 - Not Progressing by Cornell Barman, RN RH BLADDER ELIMINATION RH STG MANAGE BLADDER WITH ASSISTANCE Description STG Manage Bladder With  Max Assistance  06/16/2017 0200 - Not Progressing by Cornell Barman, RN RH STG MANAGE BLADDER WITH MEDICATION WITH ASSISTANCE Description STG Manage Bladder With Medication With min Assistance.  06/16/2017 0200 - Not Progressing by Cornell Barman, RN RH STG MANAGE BLADDER WITH EQUIPMENT WITH ASSISTANCE Description STG Manage Bladder With Equipment With total Assistance  06/16/2017 0200 - Not Progressing by Cornell Barman, RN

## 2017-06-16 NOTE — Progress Notes (Signed)
Subjective/Complaints: Patient sleeping difficult to arouse to voice, appreciate nursing note. ROS: Appears to deny CP, SOB, nausea, vomiting, diarrhea  Objective: Vital Signs: Blood pressure (!) 105/57, pulse 60, temperature 98.1 F (36.7 C), temperature source Oral, resp. rate 16, height 6' (1.829 m), weight 100.5 kg (221 lb 9 oz), SpO2 99 %. No results found. Results for orders placed or performed during the hospital encounter of 05/30/17 (from the past 72 hour(s))  Urinalysis, Routine w reflex microscopic     Status: Abnormal   Collection Time: 06/15/17 11:19 AM  Result Value Ref Range   Color, Urine YELLOW YELLOW   APPearance CLOUDY (A) CLEAR   Specific Gravity, Urine 1.016 1.005 - 1.030   pH 5.0 5.0 - 8.0   Glucose, UA NEGATIVE NEGATIVE mg/dL   Hgb urine dipstick SMALL (A) NEGATIVE   Bilirubin Urine NEGATIVE NEGATIVE   Ketones, ur NEGATIVE NEGATIVE mg/dL   Protein, ur NEGATIVE NEGATIVE mg/dL   Nitrite POSITIVE (A) NEGATIVE   Leukocytes, UA LARGE (A) NEGATIVE   RBC / HPF 6-30 0 - 5 RBC/hpf   WBC, UA TOO NUMEROUS TO COUNT 0 - 5 WBC/hpf   Bacteria, UA MANY (A) NONE SEEN   Squamous Epithelial / LPF NONE SEEN NONE SEEN   Mucus PRESENT     Comment: Performed at [facility], [address], [place], [place] [address]     HENT: Normocephalic. Atraumatic. Eyes: No discharge. EOMI.  Cardio: RRR. No JVD  Resp: CTA Bilaterally. Normal effort   GI: BS positive and Nontender Skin:   Multiple ecchymoses left upper extremity Neuro: Alert/Oriented,  Motor: 0/5 left upper extremity biceps 2-/5 hip flexion, knee extensor, 0/5 distally Dysarthria Musc/Skel:  No edema. No tenderness. General no acute distress. Vital signs reviewed.   Assessment/Plan: 1. Functional deficits secondary to right MCA infarct with left hemiparesis left neglect left hemisensory deficits as well as cognitive deficits which require 3+ hours per day of interdisciplinary therapy in a comprehensive  inpatient rehab setting. Physiatrist is providing close team supervision and 24 hour management of active medical problems listed below. Physiatrist and rehab team continue to assess barriers to discharge/monitor patient progress toward functional and medical goals. FIM: Function - Bathing Position: Other (comment)(EOB UB, bedlevel LB) Body parts bathed by patient: Abdomen, Chest, Right upper leg Body parts bathed by helper: Right arm, Left arm, Front perineal area, Buttocks, Left upper leg, Right lower leg, Left lower leg, Back Assist Level: 2 helpers  Function- Upper Body Dressing/Undressing What is the patient wearing?: Pull over shirt/dress Pull over shirt/dress - Perfomed by patient: Thread/unthread right sleeve, Put head through opening Pull over shirt/dress - Perfomed by helper: Thread/unthread left sleeve, Pull shirt over trunk Assist Level: Touching or steadying assistance(Pt > 75%) Function - Lower Body Dressing/Undressing What is the patient wearing?: Pants, Socks Position: Wheelchair/chair at sink Pants- Performed by patient: Thread/unthread right pants leg Pants- Performed by helper: Thread/unthread right pants leg, Thread/unthread left pants leg, Pull pants up/down Non-skid slipper socks- Performed by helper: Don/doff right sock, Don/doff left sock Socks - Performed by helper: Don/doff right sock, Don/doff left sock Shoes - Performed by helper: Don/doff right shoe, Don/doff left shoe, Fasten right, Fasten left Assist for footwear: Dependant Assist for lower body dressing: 2 Helpers(Standing in STEDY)  Function - Toileting Toileting activity did not occur: No continent bowel/bladder event Toileting steps completed by helper: Adjust clothing prior to toileting, Performs perineal hygiene, Adjust clothing after toileting Toileting Assistive Devices: Other (comment)(stedy) Assist level:  Two helpers  Function - Air cabin crew transfer activity did not occur:  Safety/medical concerns Toilet transfer assistive device: Elevated toilet seat/BSC over toilet, Mechanical lift Mechanical lift: Stedy Assist level to toilet: 2 helpers Assist level from toilet: 2 helpers Assist level to bedside commode (at bedside): 2 helpers Assist level from bedside commode (at bedside): 2 helpers  Function - Chair/bed transfer Chair/bed transfer method: Lateral scoot Chair/bed transfer assist level: Maximal assist (Pt 25 - 49%/lift and lower) Chair/bed transfer assistive device: Armrests, Sliding board  Function - Locomotion: Wheelchair Will patient use wheelchair at discharge?: Yes Type: Manual Wheelchair activity did not occur: Safety/medical concerns Max wheelchair distance: 59ft  Assist Level: Supervision or verbal cues Wheel 50 feet with 2 turns activity did not occur: Safety/medical concerns Assist Level: Supervision or verbal cues Wheel 150 feet activity did not occur: Safety/medical concerns Function - Locomotion: Ambulation Ambulation activity did not occur: Safety/medical concerns Assistive device: Parallel bars Max distance: 4 Assist level: Maximal assist (Pt 25 - 49%) Walk 10 feet activity did not occur: Safety/medical concerns Walk 50 feet with 2 turns activity did not occur: Safety/medical concerns Walk 150 feet activity did not occur: Safety/medical concerns Walk 10 feet on uneven surfaces activity did not occur: Safety/medical concerns  Function - Comprehension Comprehension: Auditory Comprehension assistive device: Hearing aids Comprehension assist level: Understands basic 90% of the time/cues < 10% of the time  Function - Expression Expression: Verbal Expression assist level: Expresses basic 75 - 89% of the time/requires cueing 10 - 24% of the time. Needs helper to occlude trach/needs to repeat words.  Function - Social Interaction Social Interaction assist level: Interacts appropriately 90% of the time - Needs monitoring or  encouragement for participation or interaction.  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    Continue CIR PT, OT, speech, patient is intermittently somnolent mainly in the mornings.  No further workup needed at this time    2. DVT Prophylaxis/Anticoagulation:ELIQUIS   5 mg twice a day no signs of bleeding  3. Pain Management:Lyrica 100 mg twice a day, Tylenol as needed, no neuropathic pain c/os 4. Mood:Provide emotional support 5. Neuropsych: This patientis? Fully capable of making decisions on hisown behalf. 6. Skin/Wound Care:Routine skin checks 7. Fluids/Electrolytes/Nutrition:Routine I&O's  8.Dysphagia. Dysphagia #1 nectar liquids.  -Intake is 720 mL yesterday consistent    9.Hypertension. Tenormin 50 mg daily.   Vitals:   06/16/17 0110 06/16/17 0820  BP: 103/60 (!) 105/57  Pulse: 68 60  Resp: 16   Temp: 98.1 F (36.7 C)   SpO2: 99%     Controlled 3/3 10.Atrial fibrillation with history of pacemaker. Cardiac rate controlled. Patient to follow-up with Dr[facility].  Heart rate stable in the 60s 11.Hypothyroidism. Synthroid 12.Hyperlipidemia. Zocor 13.  Constipation: Continue bowel regimen 14. CKD   Cr 1.31 on 2/18, 1.43 on 2/28   No sig change, enc fluids  15.  Neurogenic bladder resulting in urinary retention,  likely element of BPH as well requiring intermittent catheterization.  Has UTI will start Keflex, increase Urecholine, continue Flomax      LOS (Days) 17 A FACE TO FACE EVALUATION WAS PERFORMED  Charlett Blake 06/16/2017, 10:11 AM

## 2017-06-16 NOTE — Progress Notes (Signed)
I & O cath at 0100=400. Bladder scan at 0500=384, I & O cath=400cc's. Urine cloudy and malodorous. Derek Hays A

## 2017-06-16 NOTE — Plan of Care (Signed)
  Not Progressing RH BLADDER ELIMINATION RH STG MANAGE BLADDER WITH ASSISTANCE Description STG Manage Bladder With  Max Assistance  06/16/2017 1016 - Not Progressing by Leonidas Boateng, Renato Gails, RN RH STG MANAGE BLADDER WITH MEDICATION WITH ASSISTANCE Description STG Manage Bladder With Medication With min Assistance.  06/16/2017 1016 - Not Progressing by Danetta Prom, Renato Gails, RN RH STG MANAGE BLADDER WITH EQUIPMENT WITH ASSISTANCE Description STG Manage Bladder With Equipment With total Assistance  06/16/2017 1016 - Not Progressing by Beila Purdie, Renato Gails, RN  Total assist with In and Out cath

## 2017-06-17 ENCOUNTER — Inpatient Hospital Stay (HOSPITAL_COMMUNITY): Payer: TRICARE For Life (TFL) | Admitting: Occupational Therapy

## 2017-06-17 ENCOUNTER — Inpatient Hospital Stay (HOSPITAL_COMMUNITY): Payer: TRICARE For Life (TFL) | Admitting: Physical Therapy

## 2017-06-17 ENCOUNTER — Inpatient Hospital Stay (HOSPITAL_COMMUNITY): Payer: TRICARE For Life (TFL)

## 2017-06-17 LAB — URINE CULTURE

## 2017-06-17 LAB — BASIC METABOLIC PANEL
ANION GAP: 14 (ref 5–15)
BUN: 21 mg/dL — ABNORMAL HIGH (ref 6–20)
CALCIUM: 9.1 mg/dL (ref 8.9–10.3)
CO2: 22 mmol/L (ref 22–32)
Chloride: 102 mmol/L (ref 101–111)
Creatinine, Ser: 1.39 mg/dL — ABNORMAL HIGH (ref 0.61–1.24)
GFR calc non Af Amer: 45 mL/min — ABNORMAL LOW (ref >60)
GFR, EST AFRICAN AMERICAN: 52 mL/min — AB (ref >60)
GLUCOSE: 144 mg/dL — AB (ref 65–99)
Potassium: 4.5 mmol/L (ref 3.5–5.1)
Sodium: 138 mmol/L (ref 135–145)

## 2017-06-17 MED ORDER — ATENOLOL 25 MG PO TABS
25.0000 mg | ORAL_TABLET | Freq: Every day | ORAL | Status: DC
  Filled 2017-06-17 (×2): qty 1

## 2017-06-17 MED ORDER — ATENOLOL 25 MG PO TABS
25.0000 mg | ORAL_TABLET | Freq: Once | ORAL | Status: AC
  Administered 2017-06-17: 25 mg via ORAL
  Filled 2017-06-17 (×2): qty 1

## 2017-06-17 MED ORDER — CIPROFLOXACIN HCL 250 MG PO TABS
250.0000 mg | ORAL_TABLET | Freq: Two times a day (BID) | ORAL | Status: DC
  Administered 2017-06-17 – 2017-06-20 (×6): 250 mg via ORAL
  Filled 2017-06-17 (×6): qty 1

## 2017-06-17 MED ORDER — CEFDINIR 125 MG/5ML PO SUSR: 300.0000 mg | Freq: Two times a day (BID) | ORAL | Status: DC

## 2017-06-17 NOTE — Plan of Care (Signed)
  Not Progressing RH BLADDER ELIMINATION RH STG MANAGE BLADDER WITH ASSISTANCE Description STG Manage Bladder With  Max Assistance  06/17/2017 0054 - Not Progressing by Cornell Barman, RN RH STG MANAGE BLADDER WITH MEDICATION WITH ASSISTANCE Description STG Manage Bladder With Medication With min Assistance.  06/17/2017 0054 - Not Progressing by Cornell Barman, RN RH STG MANAGE BLADDER WITH EQUIPMENT WITH ASSISTANCE Description STG Manage Bladder With Equipment With total Assistance  06/17/2017 0054 - Not Progressing by Cornell Barman, RN

## 2017-06-17 NOTE — Progress Notes (Signed)
Occupational Therapy Session Note  Patient Details  Name: Derek Hays MRN: [id number] Date of Birth: 1930/05/24  Today's Date: 06/17/2017 OT Individual Time: 1400-1430 OT Individual Time Calculation (min): 30 min    Short Term Goals: Week 3:  OT Short Term Goal 1 (Week 3): STG=LTG due to awaiting SNF placement  Skilled Therapeutic Interventions/Progress Updates:    Pt seen for OT session focusing on functional mobility with standing balance/tolerance. Pt sitting up in w/c upon arrival with wife present, agreeable to tx session.  In therapy gym, pt completed sit<> stand at side of parallel bar. Stood with mod A pulling up on parallel bar. Pt stood with intial min A progressing to mod-max when fatigued due to L lean. L knee blocked by therapist and consistent multimodal cuing for upright posture and midline orientation. From standing position, had pt obtain horseshoe with R UE and cross midline to hand ot therapy tech on L facilitating L weightbearing and attention. Pt tolerated ~15-20 seconds of dynamic standing before requiring seated rest break. Max A required for controlled descent into w/c due to poor descentric control, pt unable to coordinate reaching back with L UE in prep for sitting. He self propelled ~24ft using hemi technique with min A and VCs for technique. During standing, pt's L UE placed total A on grab bar and total A to maintain WBing position. Pt returned to room at end of session, left seated in w/c with all needs in reach, QRB donned and wife present.   Therapy Documentation Precautions:  Precautions Precautions: Fall Precaution Comments:  neglect, severe L hemiparesis Restrictions Weight Bearing Restrictions: No Pain:   No/denies pain  See Function Navigator for Current Functional Status.   Therapy/Group: Individual Therapy  Illona Bulman L 06/17/2017, 7:20 AM

## 2017-06-17 NOTE — Progress Notes (Signed)
Occupational Therapy Session Note  Patient Details  Name: Derek Hays MRN: [id number] Date of Birth: [birth year]  Today's Date: 06/17/2017 OT Individual Time: 1100-1200 OT Individual Time Calculation (min): 60 min    Short Term Goals: Week 1:  OT Short Term Goal 1 (Week 1): Pt will transfer with assist +1 in order to decrease caregiver burden OT Short Term Goal 1 - Progress (Week 1): Not met OT Short Term Goal 2 (Week 1): Pt will attend to L UE during bathing task with mod cuing OT Short Term Goal 2 - Progress (Week 1): Met OT Short Term Goal 3 (Week 1): Pt will sit EOB with mod A in prep for functional task OT Short Term Goal 3 - Progress (Week 1): Met Week 2:  OT Short Term Goal 1 (Week 2): Pt will transfer with max A +1 in order to reduce caregiver burden. OT Short Term Goal 1 - Progress (Week 2): Met OT Short Term Goal 2 (Week 2): Pt will maintain static sitting balance on BSC without suport of mechanical lift with min A OT Short Term Goal 2 - Progress (Week 2): Met OT Short Term Goal 3 (Week 2): Pt will roll to R with mod In during bed level ADL OT Short Term Goal 3 - Progress (Week 2): Met Week 3:  OT Short Term Goal 1 (Week 3): STG=LTG due to awaiting SNF placement  Skilled Therapeutic Interventions/Progress Updates:    Pt seen this session for LUE NMR, balance and L visual scanning. Pt received in w/c fully dressed and had completed toileting. Pt agreeable to therapy in the gym.  Pt transferred via slide board with mod/max A of 1 (2nd person present for safety) to mat.  Worked on LUE wt bearing with A to stabilize L hand while pt pushed a large ball forward and back with mod A to support balance.  With static sit he needed min A but when his LUE was placed on a bedside table, he held his posture with S.   L visual scanning to reach for objects with R hand on L side of midline.  Focused on posture with pt responding well to cues "military attention posture" to activate RTC muscles.   With L arm on table at pt's side worked on tapping and PROM of sliding forward and back and elbow flex/ext.  Then a/arom and then pt was able to slide arm forward 1-2 inches to knock a cup off the table. Repeated this exercises several times.  Pt then was able to actively flex bicep (gravity eliminated and supported on table ) to knock cups off table.  After 40 min of unsupported sitting pt transferred back to w/c.  Worked on sit to stand 1x at side of parallel bars. Pt pulled up with R arm into stand with min A.  Mirror in front of pt for feedback. Pt with heavy lean to L needing max support on L and through L knee.  He could self correct with some cues.  Pt taken back to room with all needs met, quick release belt on.  Therapy Documentation Precautions:  Precautions Precautions: Fall Precaution Comments:  neglect, severe L hemiparesis Restrictions Weight Bearing Restrictions: No    Pain: Pain Assessment Pain Assessment: No/denies pain   ADL:    See Function Navigator for Current Functional Status.   Therapy/Group: Individual Therapy  [employer] 06/17/2017, 12:31 PM

## 2017-06-17 NOTE — Progress Notes (Signed)
Speech Language Pathology Daily Session Note  Patient Details  Name: Derek Hays MRN: [id number] Date of Birth: 07/15/1930  Today's Date: 06/17/2017 SLP Individual Time: 0930-1015 SLP Individual Time Calculation (min): 45 min  Short Term Goals: Week 3: SLP Short Term Goal 1 (Week 3): Pt will consume trials of dysphagia 2 textures with Mod A cues for use of compensatory swallow strategies to demonstrate readiness for diet upgrade.  SLP Short Term Goal 2 (Week 3): Pt will consume trials of thin liquids with minimal overt s/s of aspiration with Min A cues for compensatory swallow strategies.  SLP Short Term Goal 3 (Week 3): Pt will demonstrate intellectual awareness by identifying 2 physical and 2 cognitive deficits related to new CVA with Mod A cues.  SLP Short Term Goal 4 (Week 3): Pt will scan to midline to locate objects in 50% of opportunities and Mod A cues.  SLP Short Term Goal 5 (Week 3): Pt will complete basic  to semi complex tasks with Min A cues.  Skilled Therapeutic Interventions: Skilled ST services focused on swallow and cognitive skills. SLP facilitated PO consumption of thin liquids via cup requiring Min A verbal cues to utilize swallow strategies with 1 overt s/s aspiration delayed cough. SLP facilitated problem solving, recall of novel information requiring Mod A verbal cues during card game Blink. Pt required Min A verbal cues to scan left during functional tasks. Pt identified 2 cognitive and 2 physical deficits with Min A verbal cues related to CVA. Pt was left in room with call bell within reach. Recommend to continue skilled ST services.       Function:  Eating Eating   Modified Consistency Diet: No Eating Assist Level: Supervision or verbal cues           Cognition Comprehension Comprehension assist level: Understands basic 90% of the time/cues < 10% of the time  Expression   Expression assist level: Expresses basic 75 - 89% of the time/requires cueing 10 -  24% of the time. Needs helper to occlude trach/needs to repeat words.  Social Interaction Social Interaction assist level: Interacts appropriately 90% of the time - Needs monitoring or encouragement for participation or interaction.  Problem Solving Problem solving assist level: Solves basic 50 - 74% of the time/requires cueing 25 - 49% of the time  Memory Memory assist level: Recognizes or recalls 50 - 74% of the time/requires cueing 25 - 49% of the time    Pain Pain Assessment Pain Assessment: No/denies pain  Therapy/Group: Individual Therapy  Deliah Strehlow  [facility] 06/17/2017, 11:14 AM

## 2017-06-17 NOTE — Progress Notes (Signed)
Occupational Therapy Session Note  Patient Details  Name: Derek Hays MRN: [id number] Date of Birth: 10-13-1930  Today's Date: 06/17/2017 OT Individual Time: 1315-1335 OT Individual Time Calculation (min): 20 min    Short Term Goals: Week 3:  OT Short Term Goal 1 (Week 3): STG=LTG due to awaiting SNF placement  Skilled Therapeutic Interventions/Progress Updates:    Pt sitting up in w/c with lab present to draw blood at start of session, pt missing 10 min OT tx session. Pt agreeable to OT tx session, engaging in seated activity reaching across midline to L and R with RUE to obtain and place cups, reaching above and below BOS. Pt seated in w/c providing static/dynamic sitting support during task. Pt also engaging in PROM to LUE, reporting pain in shoulder therefore provided gentle stretching within planes of shoulder flexion and abduction. Pt completing shoulder shrugs with therapist providing input/facilitation at scapula to promote upward/forward/rotational movement. Pt left seated in w/c end of session, QRB donned, lap tray in place, spouse present and needs within reach.   Therapy Documentation Precautions:  Precautions Precautions: Fall Precaution Comments:  neglect, severe L hemiparesis Restrictions Weight Bearing Restrictions: No General: General OT Amount of Missed Time: 10 Minutes   Pain: Pain Assessment Pain Assessment: No/denies pain  See Function Navigator for Current Functional Status.   Therapy/Group: Individual Therapy  Raymondo Band 06/17/2017, 3:53 PM

## 2017-06-17 NOTE — Progress Notes (Signed)
Subjective/Complaints: Alert this am , calls me by name, asking if his solitary kidney is responsible for bladder emptying problems- we discussed this.  Still requiring ICP ROS: Appears to deny CP, SOB, nausea, vomiting, diarrhea  Objective: Vital Signs: Blood pressure 90/63, pulse 60, temperature 98.4 F (36.9 C), temperature source Oral, resp. rate 16, height 6' (1.829 m), weight 100.5 kg (221 lb 9 oz), SpO2 100 %. No results found. Results for orders placed or performed during the hospital encounter of 05/30/17 (from the past 72 hour(s))  Urinalysis, Routine w reflex microscopic     Status: Abnormal   Collection Time: 06/15/17 11:19 AM  Result Value Ref Range   Color, Urine YELLOW YELLOW   APPearance CLOUDY (A) CLEAR   Specific Gravity, Urine 1.016 1.005 - 1.030   pH 5.0 5.0 - 8.0   Glucose, UA NEGATIVE NEGATIVE mg/dL   Hgb urine dipstick SMALL (A) NEGATIVE   Bilirubin Urine NEGATIVE NEGATIVE   Ketones, ur NEGATIVE NEGATIVE mg/dL   Protein, ur NEGATIVE NEGATIVE mg/dL   Nitrite POSITIVE (A) NEGATIVE   Leukocytes, UA LARGE (A) NEGATIVE   RBC / HPF 6-30 0 - 5 RBC/hpf   WBC, UA TOO NUMEROUS TO COUNT 0 - 5 WBC/hpf   Bacteria, UA MANY (A) NONE SEEN   Squamous Epithelial / LPF NONE SEEN NONE SEEN   Mucus PRESENT     Comment: Performed at [facility], [address], [place], [place] [address]  Urine Culture     Status: Abnormal (Preliminary result)   Collection Time: 06/15/17 11:19 AM  Result Value Ref Range   Specimen Description URINE, CATHETERIZED    Special Requests NONE    Culture (A)     80,000 COLONIES/mL CITROBACTER AMALONATICUS SUSCEPTIBILITIES TO FOLLOW Performed at [facility], [place] [address], [place], [place] [address]    Report Status PENDING      HENT: Normocephalic. Atraumatic. Eyes: No discharge. EOMI.  Cardio: RRR. No JVD  Resp: CTA Bilaterally. Normal effort   GI: BS positive and Nontender Skin:   Multiple ecchymoses left upper  extremity Neuro: Alert/Oriented,  Motor: 0/5 left upper extremity biceps 2-/5 hip flexion, knee extensor, 0/5 distally Dysarthria Musc/Skel:  No edema. No tenderness. General no acute distress. Vital signs reviewed.   Assessment/Plan: 1. 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 Physiatrist is providing close team supervision and 24 hour management of active medical problems listed below. Physiatrist and rehab team continue to assess barriers to discharge/monitor patient progress toward functional and medical goals. FIM: Function - Bathing Position: Other (comment)(EOB UB, bedlevel LB) Body parts bathed by patient: Abdomen, Chest, Right upper leg Body parts bathed by helper: Right arm, Left arm, Front perineal area, Buttocks, Left upper leg, Right lower leg, Left lower leg, Back Assist Level: 2 helpers  Function- Upper Body Dressing/Undressing What is the patient wearing?: Pull over shirt/dress Pull over shirt/dress - Perfomed by patient: Thread/unthread right sleeve, Put head through opening Pull over shirt/dress - Perfomed by helper: Thread/unthread left sleeve, Pull shirt over trunk Assist Level: Touching or steadying assistance(Pt > 75%) Function - Lower Body Dressing/Undressing What is the patient wearing?: Pants, Socks Position: Wheelchair/chair at sink Pants- Performed by patient: Thread/unthread right pants leg Pants- Performed by helper: Thread/unthread right pants leg, Thread/unthread left pants leg, Pull pants up/down Non-skid slipper  socks- Performed by helper: Don/doff right sock, Don/doff left sock Socks - Performed by helper: Don/doff right sock, Don/doff left sock Shoes - Performed by helper: Don/doff right shoe, Don/doff left shoe, Fasten right, Fasten left Assist  for footwear: Dependant Assist for lower body dressing: 2 Helpers(Standing in STEDY)  Function - Toileting Toileting activity did not occur: Safety/medical concerns Toileting steps completed by helper: Adjust clothing prior to toileting, Performs perineal hygiene, Adjust clothing after toileting Toileting Assistive Devices: Other (comment) Assist level: Two helpers  Function - Air cabin crew transfer activity did not occur: Safety/medical concerns Toilet transfer assistive device: Mechanical lift, Elevated toilet seat/BSC over toilet Mechanical lift: Stedy Assist level to toilet: 2 helpers Assist level from toilet: 2 helpers Assist level to bedside commode (at bedside): 2 helpers Assist level from bedside commode (at bedside): 2 helpers  Function - Chair/bed transfer Chair/bed transfer method: Lateral scoot Chair/bed transfer assist level: Maximal assist (Pt 25 - 49%/lift and lower) Chair/bed transfer assistive device: Armrests, Sliding board  Function - Locomotion: Wheelchair Will patient use wheelchair at discharge?: Yes Type: Manual Wheelchair activity did not occur: Safety/medical concerns Max wheelchair distance: 57ft  Assist Level: Supervision or verbal cues Wheel 50 feet with 2 turns activity did not occur: Safety/medical concerns Assist Level: Supervision or verbal cues Wheel 150 feet activity did not occur: Safety/medical concerns Function - Locomotion: Ambulation Ambulation activity did not occur: Safety/medical concerns Assistive device: Parallel bars Max distance: 4 Assist level: Maximal assist (Pt 25 - 49%) Walk 10 feet activity did not occur: Safety/medical concerns Walk 50 feet with 2 turns activity did not occur: Safety/medical concerns Walk 150 feet activity did not occur: Safety/medical concerns Walk 10 feet on uneven surfaces activity did not occur: Safety/medical concerns  Function - Comprehension Comprehension: Auditory Comprehension assistive  device: Hearing aids Comprehension assist level: Understands basic 90% of the time/cues < 10% of the time  Function - Expression Expression: Verbal Expression assist level: Expresses basic 75 - 89% of the time/requires cueing 10 - 24% of the time. Needs helper to occlude trach/needs to repeat words.  Function - Social Interaction Social Interaction assist level: Interacts appropriately 90% of the time - Needs monitoring or encouragement for participation or interaction.  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    Continue CIR PT, OT, speech,    2. DVT Prophylaxis/Anticoagulation:ELIQUIS   5 mg twice a day no signs of bleeding  3. Pain Management:Lyrica 100 mg twice a day, Tylenol as needed, no neuropathic pain c/os 4. Mood:Provide emotional support 5. Neuropsych: This patientis? Fully capable of making decisions on hisown behalf. 6. Skin/Wound Care:Routine skin checks 7. Fluids/Electrolytes/Nutrition:Routine I&O's  8.Dysphagia. Dysphagia #1 nectar liquids.  -Intake is 600 mL yesterday consistent, low intake with lower BP check BP    9.Hypertension. Tenormin 50 mg daily.   Vitals:   06/16/17 1446 06/17/17 0001  BP: (!) 105/54 90/63  Pulse: 62 60  Resp: 16 16  Temp: 97.7 F (36.5 C) 98.4 F (36.9 C)  SpO2: 100% 100%    Controlled 3/4 running  on low side will reduce tenormin, flomax may be lowering BP 10.Atrial fibrillation with history of pacemaker. Cardiac rate controlled. Patient to follow-[facility].  Heart rate stable in the 60s, monitor on lower dose of tenormin 11.Hypothyroidism. Synthroid 12.Hyperlipidemia. Zocor 13.  Constipation: Continue bowel regimen 14. CKD   Cr 1.31 on 2/18, 1.43 on 2/28   No sig change, enc fluids 15.  Neurogenic bladder resulting in urinary retention, likely element of BPH as well requiring intermittent catheterization.  Has UTI will start Keflex, increase Urecholine, continue Flomax, Culture showing 80K citrobacter sensitivity pending, cont keflex for now      LOS (Days) 18 A FACE TO FACE EVALUATION WAS PERFORMED  Charlett Blake 06/17/2017, 8:32 AM

## 2017-06-17 NOTE — Progress Notes (Signed)
At 2300, bladder scan=280cc's. At 0200, bladder scan=400cc's, I & O cath=300's. At 0600, bladder scan=207cc's. Restless night without specific complaint of. Derek Hays A

## 2017-06-17 NOTE — Progress Notes (Signed)
Physical Therapy Session Note  Patient Details  Name: Derek Hays MRN: [id number] Date of Birth: 1931/04/16  Today's Date: 06/17/2017 PT Individual Time: 7253-6644 PT Individual Time Calculation (min): 45 min   Short Term Goals: Week 2:  PT Short Term Goal 1 (Week 2): Pt will maintain sitting balance with supervision assist x 10 minutes.  PT Short Term Goal 2 (Week 2): Pt will propell WC 118ft with min assist  PT Short Term Goal 3 (Week 2): Pt will transfer to [facility] with mod assist  PT Short Term Goal 4 (Week 2): Pt will perform bed mobility with mod assist   Skilled Therapeutic Interventions/Progress Updates: Pt presented in bed agreeable to therapy. Pt performed supine to sit mod/maxA with maxA for truncal support and mod cues for sequencing. PTA threaded pants total assist for time management and performed sit to stand in [place] maxA x 1 with max cues for erect posture and maintaining midline. Pt requesting to use toilet, transferred in [place] to toilet (+BM/void) and PTA performed total assist for peri-care. Performed transfer to standard w/c. Performed w/c mobility modA for use of RLE to maintain straight path. Pt performed squat pivot transfer to R maxA with max multimodal cues for anterior lean and use of RUE. Performed scooting to R with modA and cues for increased pushing with RLE. Squat pivot to R with maxA however pt able to reach for w/c to facilitate transfer. Pt returned to room and half lap tray placed. Pt handed off to SLP for next session.      Therapy Documentation Precautions:  Precautions Precautions: Fall Precaution Comments:  neglect, severe L hemiparesis Restrictions Weight Bearing Restrictions: No General:   Vital Signs: Therapy Vitals Temp: 98.4 F (36.9 C) Temp Source: Oral Pulse Rate: 60 Resp: 16 BP: (!) 127/59 Patient Position (if appropriate): Sitting Oxygen Therapy SpO2: 100 % O2 Device: Room Air Pain: Pain Assessment Pain Assessment: No/denies  pain   See Function Navigator for Current Functional Status.   Therapy/Group: Individual Therapy  Adrien Shankar  Seymone Forlenza, PTA  06/17/2017, 4:08 PM

## 2017-06-18 ENCOUNTER — Inpatient Hospital Stay (HOSPITAL_COMMUNITY): Payer: TRICARE For Life (TFL) | Admitting: Physical Therapy

## 2017-06-18 ENCOUNTER — Inpatient Hospital Stay (HOSPITAL_COMMUNITY): Payer: Medicare Other | Admitting: Occupational Therapy

## 2017-06-18 ENCOUNTER — Inpatient Hospital Stay (HOSPITAL_COMMUNITY): Payer: TRICARE For Life (TFL) | Admitting: Occupational Therapy

## 2017-06-18 ENCOUNTER — Ambulatory Visit (HOSPITAL_COMMUNITY): Payer: TRICARE For Life (TFL)

## 2017-06-18 NOTE — Progress Notes (Signed)
Physical Therapy Weekly Progress Note  Patient Details  Name: Derek Hays MRN: [id number] Date of Birth: [birth year]  Beginning of progress report period: June 07, 2017 End of progress report period: June 18, 2017  Today's Date: 06/18/2017 PT Individual Time: 1305-1400 PT Individual Time Calculation (min): 55 min   Patient has met 2 of 4 short term goals.  Pt continues to make slow gains in progression of therapy. Pt has made significant improvements in sitting balance and upright tolerance through therapy sessions. Pt continues to require significant assistance for transfers and requires max cues for sequencing. Pt's standing tolerance continues to improve with a static stand of up to a minute however continues to require cues for midline orientation particularly with fatigue.   Patient continues to demonstrate the following deficits muscle weakness and muscle paralysis and impaired timing and sequencing, abnormal tone, unbalanced muscle activation, decreased coordination and decreased motor planning and therefore will continue to benefit from skilled PT intervention to increase functional independence with mobility.  Patient progressing toward long term goals..  Continue plan of care.  PT Short Term Goals Week 2:  PT Short Term Goal 1 (Week 2): Pt will maintain sitting balance with supervision assist x 10 minutes.  PT Short Term Goal 1 - Progress (Week 2): Met PT Short Term Goal 2 (Week 2): Pt will propell WC 165f with min assist  PT Short Term Goal 2 - Progress (Week 2): Met PT Short Term Goal 3 (Week 2): [facility]  PT Short Term Goal 3 - Progress (Week 2): Progressing toward goal PT Short Term Goal 4 (Week 2): Pt will perform bed mobility with mod assist  PT Short Term Goal 4 - Progress (Week 2): Progressing toward goal Week 3:    STG=LTG due to ELOS  Skilled Therapeutic Interventions/Progress Updates: Pt presented in w/c with cousin present  agreeable to therapy. Pt participated in w/c propulsion 1073fwith max cues for coordination due to hemi technique. Pt continues to require max cues for maintaining straight path. In rehab gym pt performed SB transfer with pt requiring max cues for sequencing and for increased lateral push. On mat pt participated in modified scoots to facilitate increased boost from mat. With cues pt able to perform lateral scoots across mat with minA with buttock slightly clearing mat. Pt returned to w/c via SBArena1 with multimodal cues. Pt transported back to room and remained in w/c with QRB, and half lap in place with call bell within reach.      Therapy Documentation Precautions:  Precautions Precautions: Fall Precaution Comments:  neglect, severe L hemiparesis Restrictions Weight Bearing Restrictions: No    Vital Signs: Therapy Vitals Temp: 98 F (36.7 C) Temp Source: Oral Pulse Rate: (!) 55 Resp: 18 BP: 112/70 Patient Position (if appropriate): Sitting   See Function Navigator for Current Functional Status.  Therapy/Group: Individual Therapy  Rosita DeChalus  Rosita DeChalus, PTA  06/18/2017, 3:27 PM

## 2017-06-18 NOTE — Progress Notes (Signed)
Occupational Therapy Session Note  Patient Details  Name: Derek Hays MRN: [id number] Date of Birth: January 16, 1931  Today's Date: 06/18/2017 OT Individual Time: 1005-1100 OT Individual Time Calculation (min): 55 min    Short Term Goals: Week 3:  OT Short Term Goal 1 (Week 3): STG=LTG due to awaiting SNF placement  Skilled Therapeutic Interventions/Progress Updates:    Pt seen for OT session focusing on functional sitting balance, transfers and ADL re-training. Pt in supine upon arrival, RN exiting following in/out cath. Pt agreeable to tx session and denying pain. Clean brief and hygiene completed total A, pt rolling to L with min A and rolling to R with min A once extremities placed. He transferred to EOB with mod A +1. Sitting EOB, pt able to reach forward to doff R sock with VCs for problem solving and mod A for balance. When sitting statically EOB, pt able to maintain balance with overall supervision, min cuing for erect and midline posture. Pt stood in STEDY with min-mod A from EOB to have pants pulled up total A. He completed sliding board transfer to w/c, transferring to R, max A initially however, progressing to min A once pt able to reach w/c armrest to assist with pulling.  Pt completed UB bathing/dressing from w/c level. Required cuing for attention to L UE when doffing clothing, however, attended to UE during bathing and when recalling hemi dressing technique to don shirt. Total A hand over hand assist provided to use L UE to wash R arm with pt voicing some discomfort in shoulder with movement.  Pt left seated in w/c at sink at end of session in prep for hand off to next OT.   Therapy Documentation Precautions:  Precautions Precautions: Fall Precaution Comments:  neglect, severe L hemiparesis Restrictions Weight Bearing Restrictions: No Pain:   No/denies pain  See Function Navigator for Current Functional Status.   Therapy/Group: Individual Therapy  Halsey Persaud  L 06/18/2017, 7:06 AM

## 2017-06-18 NOTE — Progress Notes (Signed)
Speech Language Pathology Daily Session Note  Patient Details  Name: Derek Hays MRN: [id number] Date of Birth: [birth year]  Today's Date: 06/18/2017 SLP Individual Time: 0800-0900 SLP Individual Time Calculation (min): 60 min  Short Term Goals: Week 3: SLP Short Term Goal 1 (Week 3): Pt will consume trials of dysphagia 2 textures with Mod A cues for use of compensatory swallow strategies to demonstrate readiness for diet upgrade.  SLP Short Term Goal 2 (Week 3): Pt will consume trials of thin liquids with minimal overt s/s of aspiration with Min A cues for compensatory swallow strategies.  SLP Short Term Goal 3 (Week 3): Pt will demonstrate intellectual awareness by identifying 2 physical and 2 cognitive deficits related to new CVA with Mod A cues.  SLP Short Term Goal 4 (Week 3): Pt will scan to midline to locate objects in 50% of opportunities and Mod A cues.  SLP Short Term Goal 5 (Week 3): Pt will complete basic  to semi complex tasks with Min A cues.  Skilled Therapeutic Interventions:Skilled ST services focused on swallow and cognitive skills. SLP facilitated PO consumption of dys 1 and thin liquid via cup/straw, SLP set up tray and pt demonstrated use of swallow strategies supervision A / Mod I. Pt demonstrated basic problem solving with meal tray, accessing items and requesting assistance when unable to reach items. Pt demonstrated scanning to left with Min A verbal and visual tracking cues. SLP facilitated problem solving with reduced supervision during meals providing verbal scenarios and practicing utilizing call bell with supervision A verbal/question prompts. SLP adjusted supervision to intermittent supervision A and communicated with nursing staff.  Pt was left in room with call bell within reach. Reccomend to continue skilled ST services.      Function:  Eating Eating   Modified Consistency Diet: Yes Eating Assist Level: Set up assist for;Supervision or verbal cues    Eating Set Up Assist For: Opening containers       Cognition Comprehension Comprehension assist level: Understands basic 90% of the time/cues < 10% of the time  Expression   Expression assist level: Expresses basic 75 - 89% of the time/requires cueing 10 - 24% of the time. Needs helper to occlude trach/needs to repeat words.  Social Interaction Social Interaction assist level: Interacts appropriately 90% of the time - Needs monitoring or encouragement for participation or interaction.  Problem Solving Problem solving assist level: Solves basic 90% of the time/requires cueing < 10% of the time  Memory Memory assist level: Recognizes or recalls 50 - 74% of the time/requires cueing 25 - 49% of the time    Pain Pain Assessment Pain Assessment: No/denies pain  Therapy/Group: Individual Therapy  Milt Coye  [facility] 06/18/2017, 4:56 PM

## 2017-06-18 NOTE — Progress Notes (Signed)
Occupational Therapy Session Note  Patient Details  Name: STANCIL DEISHER MRN: [id number] Date of Birth: 1931-03-15  Today's Date: 06/18/2017 OT Individual Time: 1500-1530 OT Individual Time Calculation (min): 30 min   Short Term Goals: Week 3:  OT Short Term Goal 1 (Week 3): STG=LTG due to awaiting SNF placement  Skilled Therapeutic Interventions/Progress Updates:    OT treatment session focused on L UE NMR. Pt greeted in wc and brought to dayroom. Towel pushes at table height for neuro re-ed. Pt able to push towel forward, but had difficulty pulling back requiring OT assist. Focus on high repetitions and normal movement patterns. Pt returned to room at end of session and request to don long sleeve shirt. Assisted with donning shirt and pt request to sit up with family present. OT placed safety belt on and call bell in lap.   Therapy Documentation Precautions:  Precautions Precautions: Fall Precaution Comments:  neglect, severe L hemiparesis Restrictions Weight Bearing Restrictions: No Pain:   none/denies pain See Function Navigator for Current Functional Status.   Therapy/Group: Individual Therapy  Valma Cava 06/18/2017, 3:40 PM

## 2017-06-18 NOTE — Progress Notes (Signed)
Subjective/Complaints: Sleeping , briefly awakens to physical stim ROS: Appears to deny CP, SOB, nausea, vomiting, diarrhea  Objective: Vital Signs: Blood pressure 97/73, pulse 97, temperature 98 F (36.7 C), temperature source Oral, resp. rate 17, height 6' (1.829 m), weight 100.5 kg (221 lb 9 oz), SpO2 100 %. No results found. Results for orders placed or performed during the hospital encounter of 05/30/17 (from the past 72 hour(s))  Urinalysis, Routine w reflex microscopic     Status: Abnormal   Collection Time: 06/15/17 11:19 AM  Result Value Ref Range   Color, Urine YELLOW YELLOW   APPearance CLOUDY (A) CLEAR   Specific Gravity, Urine 1.016 1.005 - 1.030   pH 5.0 5.0 - 8.0   Glucose, UA NEGATIVE NEGATIVE mg/dL   Hgb urine dipstick SMALL (A) NEGATIVE   Bilirubin Urine NEGATIVE NEGATIVE   Ketones, ur NEGATIVE NEGATIVE mg/dL   Protein, ur NEGATIVE NEGATIVE mg/dL   Nitrite POSITIVE (A) NEGATIVE   Leukocytes, UA LARGE (A) NEGATIVE   RBC / HPF 6-30 0 - 5 RBC/hpf   WBC, UA TOO NUMEROUS TO COUNT 0 - 5 WBC/hpf   Bacteria, UA MANY (A) NONE SEEN   Squamous Epithelial / LPF NONE SEEN NONE SEEN   Mucus PRESENT     Comment: Performed at [facility], [address], [place], [place] [address]  Urine Culture     Status: Abnormal   Collection Time: 06/15/17 11:19 AM  Result Value Ref Range   Specimen Description URINE, CATHETERIZED    Special Requests      NONE Performed at [facility] [address], [place], [place] [address]    Culture 80,000 COLONIES/mL CITROBACTER AMALONATICUS (A)    Report Status 06/17/2017 FINAL    Organism ID, Bacteria CITROBACTER AMALONATICUS (A)       Susceptibility   Citrobacter amalonaticus - MIC*    CEFAZOLIN >=64 RESISTANT Resistant     CEFTRIAXONE <=1 SENSITIVE Sensitive     CIPROFLOXACIN <=0.25 SENSITIVE Sensitive     GENTAMICIN <=1 SENSITIVE Sensitive     IMIPENEM <=0.25 SENSITIVE Sensitive     NITROFURANTOIN 64 INTERMEDIATE  Intermediate     TRIMETH/SULFA <=20 SENSITIVE Sensitive     PIP/TAZO <=4 SENSITIVE Sensitive     * 80,000 COLONIES/mL CITROBACTER AMALONATICUS  Basic metabolic panel     Status: Abnormal   Collection Time: 06/17/17  1:07 PM  Result Value Ref Range   Sodium 138 135 - 145 mmol/L   Potassium 4.5 3.5 - 5.1 mmol/L   Chloride 102 101 - 111 mmol/L   CO2 22 22 - 32 mmol/L   Glucose, Bld 144 (H) 65 - 99 mg/dL   BUN 21 (H) 6 - 20 mg/dL   Creatinine, Ser 1.39 (H) 0.61 - 1.24 mg/dL   Calcium 9.1 8.9 - 10.3 mg/dL   GFR calc non Af Amer 45 (L) >60 mL/min   GFR calc Af Amer 52 (L) >60 mL/min    Comment: (NOTE) The eGFR has been calculated using the CKD EPI equation. This calculation has not been validated in all clinical situations. eGFR's persistently <60 mL/min signify possible Chronic Kidney Disease.    Anion gap 14 5 - 15    Comment: Performed at [facility] [address], [place], [place] [address]     HENT: Normocephalic. Atraumatic. Eyes: No discharge. EOMI.  Cardio: RRR. No JVD  Resp: CTA Bilaterally. Normal effort   GI: BS positive and Nontender Skin:  Multiple ecchymoses left upper extremity Neuro: Alert/Oriented,  Motor: 0/5 left upper extremity biceps 2-/5 hip flexion, knee extensor, 0/5 distally Dysarthria Musc/Skel:  No edema. No tenderness. General no acute distress. Vital signs reviewed.   Assessment/Plan: 1. 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 Physiatrist is providing close team supervision and 24 hour management of active medical problems listed below. Physiatrist and rehab team continue to assess barriers to discharge/monitor patient progress toward functional and medical goals. FIM: Function - Bathing Position: Other (comment)(EOB UB, bedlevel  LB) Body parts bathed by patient: Abdomen, Chest, Right upper leg Body parts bathed by helper: Right arm, Left arm, Front perineal area, Buttocks, Left upper leg, Right lower leg, Left lower leg, Back Assist Level: 2 helpers  Function- Upper Body Dressing/Undressing What is the patient wearing?: Pull over shirt/dress Pull over shirt/dress - Perfomed by patient: Thread/unthread right sleeve, Put head through opening Pull over shirt/dress - Perfomed by helper: Thread/unthread left sleeve, Pull shirt over trunk Assist Level: Touching or steadying assistance(Pt > 75%) Function - Lower Body Dressing/Undressing What is the patient wearing?: Pants, Socks Position: Wheelchair/chair at sink Pants- Performed by patient: Thread/unthread right pants leg Pants- Performed by helper: Thread/unthread right pants leg, Thread/unthread left pants leg, Pull pants up/down Non-skid slipper socks- Performed by helper: Don/doff right sock, Don/doff left sock Socks - Performed by helper: Don/doff right sock, Don/doff left sock Shoes - Performed by helper: Don/doff right shoe, Don/doff left shoe, Fasten right, Fasten left Assist for footwear: Dependant Assist for lower body dressing: 2 Helpers(Standing in STEDY)  Function - Toileting Toileting activity did not occur: Safety/medical concerns Toileting steps completed by helper: Adjust clothing prior to toileting, Performs perineal hygiene, Adjust clothing after toileting Toileting Assistive Devices: Grab bar or rail Assist level: Two helpers  Function - Air cabin crew transfer activity did not occur: Safety/medical concerns Toilet transfer assistive device: Mechanical lift, Elevated toilet seat/BSC over toilet Mechanical lift: Stedy Assist level to toilet: 2 helpers Assist level from toilet: 2 helpers Assist level to bedside commode (at bedside): 2 helpers Assist level from bedside commode (at bedside): 2 helpers  Function - Chair/bed  transfer Chair/bed transfer method: Lateral scoot Chair/bed transfer assist level: Maximal assist (Pt 25 - 49%/lift and lower) Chair/bed transfer assistive device: Armrests, Sliding board  Function - Locomotion: Wheelchair Will patient use wheelchair at discharge?: Yes Type: Manual Wheelchair activity did not occur: Safety/medical concerns Max wheelchair distance: 14f  Assist Level: Supervision or verbal cues Wheel 50 feet with 2 turns activity did not occur: Safety/medical concerns Assist Level: Supervision or verbal cues Wheel 150 feet activity did not occur: Safety/medical concerns Function - Locomotion: Ambulation Ambulation activity did not occur: Safety/medical concerns Assistive device: Parallel bars Max distance: 4 Assist level: Maximal assist (Pt 25 - 49%) Walk 10 feet activity did not occur: Safety/medical concerns Walk 50 feet with 2 turns activity did not occur: Safety/medical concerns Walk 150 feet activity did not occur: Safety/medical concerns Walk 10 feet on uneven surfaces activity did not occur: Safety/medical concerns  Function - Comprehension Comprehension: Auditory Comprehension assistive device: Hearing aids Comprehension assist level: Understands basic 90% of the time/cues < 10% of the time  Function - Expression Expression: Verbal Expression assistive device: Talk trach valve Expression assist level: Expresses basic 75 - 89% of the time/requires cueing 10 - 24% of  the time. Needs helper to occlude trach/needs to repeat words.  Function - Social Interaction Social Interaction assist level: Interacts appropriately 90% of the time - Needs monitoring or encouragement for participation or interaction.  Function - Problem Solving Problem solving assist level: Solves basic 50 - 74% of the time/requires cueing 25 - 49% of the time  Function - Memory Memory assist level: Recognizes or recalls 50 - 74% of the time/requires cueing 25 - 49% of the time Patient  normally able to recall (first 3 days only): That he or she is in a hospital, Staff names and faces, Current season  Medical Problem List and Plan: 1.Left hemiparesis, left neglect, left hemisensory deficitssecondary to right MCA infarction/distal M1 segment occlusion status post revascularizationas well as history of traumatic SDH September 2019 with follow-up CT the head 05/10/2017 showed SDH completely reabsorbed.    Continue CIR PT, OT, speech,   Team conf in am 2. DVT Prophylaxis/Anticoagulation:ELIQUIS   5 mg twice a day no signs of bleeding  3. Pain Management:Lyrica 100 mg twice a day, Tylenol as needed, no neuropathic pain c/os 4. Mood:Provide emotional support 5. Neuropsych: This patientis? Fully capable of making decisions on hisown behalf. 6. Skin/Wound Care:Routine skin checks 7. Fluids/Electrolytes/Nutrition:Routine I&O's  8.Dysphagia. Dysphagia #1 nectar liquids.  -Intake is 755 mL yesterday consistent, low intake with lower BP check BMET ok    9.Hypertension. Tenormin 50 mg daily.   Vitals:   06/17/17 1438 06/18/17 0252  BP: (!) 127/59 97/73  Pulse: 60 97  Resp: 16 17  Temp: 98.4 F (36.9 C) 98 F (36.7 C)  SpO2: 100% 100%    Controlled 3/5 running on low side will reduce tenormin as of 3/5, flomax may be lowering BP as well 10.Atrial fibrillation with history of pacemaker. Cardiac rate controlled. Patient to follow-up with [facility].  Heart rate stable in the 60s, monitor on lower dose of tenormin 11.Hypothyroidism. Synthroid 12.Hyperlipidemia. Zocor 13.  Constipation: Continue bowel regimen 14. CKD   Cr 1.31 on 2/18, 1.43 on 2/28   No sig change, enc fluids 15.  Neurogenic bladder resulting in urinary retention, likely element of BPH as well requiring intermittent catheterization.  Has UTI will start Keflex, increase Urecholine, continue Flomax, Culture showing 80K citrobacter sens to cipro 16.  Urinary retention improved on  urecholine and flomax     LOS (Days) 19 A FACE TO FACE EVALUATION WAS PERFORMED  Charlett Blake 06/18/2017, 7:12 AM

## 2017-06-18 NOTE — Progress Notes (Signed)
Occupational Therapy Session Note  Patient Details  Name: Derek Hays MRN: [id number] Date of Birth: 1930-10-08  Today's Date: 06/18/2017 OT Individual Time: 1100(make up time)-1128 OT Individual Time Calculation (min): 28 min    Short Term Goals: Week 3:  OT Short Term Goal 1 (Week 3): STG=LTG due to awaiting SNF placement  Skilled Therapeutic Interventions/Progress Updates:    Upon entering the room, pt seated in wheelchair at sink awaiting therapist and transitioning easily from therapy session. Pt completed grooming tasks with focus on locating items to the L of sink with mod verbal cues. Pt required set up A to obtain all needed items and open containers. Pt required hand over hand assistance to incorporate L UE into functional task. Pt performed self ROM to L UE digits,wrist, and elbow for stretching as well as to bring awareness to L side. Pt remaining in wheelchair at end of session with quick release belt donned and call bell within reach upon exiting the room.   Therapy Documentation Precautions:  Precautions Precautions: Fall Precaution Comments:  neglect, severe L hemiparesis Restrictions Weight Bearing Restrictions: No  See Function Navigator for Current Functional Status.   Therapy/Group: Individual Therapy  Gypsy Decant 06/18/2017, 2:23 PM

## 2017-06-19 ENCOUNTER — Inpatient Hospital Stay (HOSPITAL_COMMUNITY): Payer: TRICARE For Life (TFL) | Admitting: Physical Therapy

## 2017-06-19 ENCOUNTER — Inpatient Hospital Stay (HOSPITAL_COMMUNITY): Payer: TRICARE For Life (TFL) | Admitting: Occupational Therapy

## 2017-06-19 ENCOUNTER — Inpatient Hospital Stay (HOSPITAL_COMMUNITY): Payer: TRICARE For Life (TFL) | Admitting: Speech Pathology

## 2017-06-19 MED ORDER — CIPROFLOXACIN HCL 250 MG PO TABS: 250.0000 mg | ORAL_TABLET | Freq: Two times a day (BID) | ORAL | Status: DC

## 2017-06-19 MED ORDER — BETHANECHOL CHLORIDE 25 MG PO TABS
25.0000 mg | ORAL_TABLET | Freq: Three times a day (TID) | ORAL | Status: DC
  Administered 2017-06-19 – 2017-06-20 (×4): 25 mg via ORAL
  Filled 2017-06-19 (×4): qty 1

## 2017-06-19 MED ORDER — APIXABAN 5 MG PO TABS: 5.0000 mg | ORAL_TABLET | Freq: Two times a day (BID) | ORAL | Status: DC

## 2017-06-19 MED ORDER — TAMSULOSIN HCL 0.4 MG PO CAPS: 0.4000 mg | ORAL_CAPSULE | Freq: Every day | ORAL | Status: AC

## 2017-06-19 MED ORDER — BETHANECHOL CHLORIDE 25 MG PO TABS: 25.0000 mg | ORAL_TABLET | Freq: Three times a day (TID) | ORAL | Status: AC

## 2017-06-19 NOTE — Progress Notes (Signed)
Speech Language Pathology Daily Session Note  Patient Details  Name: Derek Hays MRN: [id number] Date of Birth: [birth year]  Today's Date: 06/19/2017 SLP Individual Time: 0830-0900 SLP Individual Time Calculation (min): 30 min  Short Term Goals: Week 3: SLP Short Term Goal 1 (Week 3): Pt will consume trials of dysphagia 2 textures with Mod A cues for use of compensatory swallow strategies to demonstrate readiness for diet upgrade.  SLP Short Term Goal 2 (Week 3): Pt will consume trials of thin liquids with minimal overt s/s of aspiration with Min A cues for compensatory swallow strategies.  SLP Short Term Goal 3 (Week 3): Pt will demonstrate intellectual awareness by identifying 2 physical and 2 cognitive deficits related to new CVA with Mod A cues.  SLP Short Term Goal 4 (Week 3): Pt will scan to midline to locate objects in 50% of opportunities and Mod A cues.  SLP Short Term Goal 5 (Week 3): Pt will complete basic  to semi complex tasks with Min A cues.  Skilled Therapeutic Interventions: Skilled treatment session focused on dysphagia and cognition goals. SLP received pt in room, up in wheelchair with breakfast tray set up for him. Multiple items were in the floor and pt had consumed half of his meal (only items in his right field of vision). Pt was not using any compensatory swallow strategies  - although there are some posted in room. SLP further facilitated session by providing skilled observation of pt consuming thin liquids via cup. Pt with moderate sips and subtle throat clear with every swallow. SLP further facilitated session by providing trials of graham crackers. Pt with increased lingual manipulation, good bolus cohesion and he placed bolus on his right to increase oral containment. Pt demonstrated complete oral clearing. Will provide supervision of full tray before upgrade. Pt requires full nursing supervision during meals d/t subtle throat clears with each bolus of thin liquids,  left inattention, risk of fatigue throughout meal and throughout day and moderate cognitive impairments (impacting memory, self- awareness and ability to self-monitor). Pt was returned to room, left upright in wheelchair with all needs within reach. Continue per current plan of care.      Function:  Eating Eating   Modified Consistency Diet: No(Trials with SLP) Eating Assist Level: Set up assist for;Supervision or verbal cues   Eating Set Up Assist For: Opening containers       Cognition Comprehension Comprehension assist level: Understands basic 75 - 89% of the time/ requires cueing 10 - 24% of the time;Understands basic 90% of the time/cues < 10% of the time  Expression   Expression assist level: Expresses basic 75 - 89% of the time/requires cueing 10 - 24% of the time. Needs helper to occlude trach/needs to repeat words.  Social Interaction Social Interaction assist level: Interacts appropriately 75 - 89% of the time - Needs redirection for appropriate language or to initiate interaction.  Problem Solving Problem solving assist level: Solves basic 75 - 89% of the time/requires cueing 10 - 24% of the time  Memory Memory assist level: Recognizes or recalls 50 - 74% of the time/requires cueing 25 - 49% of the time    Pain Pain Assessment Pain Assessment: No/denies pain  Therapy/Group: Individual Therapy  Thelma Lorenzetti 06/19/2017, 9:53 AM

## 2017-06-19 NOTE — Progress Notes (Signed)
Social Work Patient ID: Derek Hays, male   DOB: [birth year], 84 y.o.   MRN: [id number]  Met with pt to inform spoke with Shirlee Limerick from [facility] and they can take him tomorrow. Have left a message for his wife and awaiting her return call. This is the facility wife really wants him to go due to close to their home. Will work on transfer tomorrow and await wife's return call, at an appointment at [facility].

## 2017-06-19 NOTE — Progress Notes (Signed)
Occupational Therapy Session Note  Patient Details  Name: Derek Hays MRN: [id number] Date of Birth: 1930-09-26  Today's Date: 06/19/2017 OT Individual Time: 0930-1030 OT Individual Time Calculation (min): 60 min    Short Term Goals: Week 3:  OT Short Term Goal 1 (Week 3): STG=LTG due to awaiting SNF placement  Skilled Therapeutic Interventions/Progress Updates:    Pt seen for OT session focusing on ADL re-training, functional mobility, and core strengthening/stability. Pt sitting up in w/c upon arrival with RN present providing supervision for meal, hand off to OT and pt agreeable to tx session.  Completed UB/LB dressing, pt able to recall hemi dressing technique, however, required max A for threading of B UEs into sleeves as well as VCs for problem solving and techniqe as pt pulling sleeve down arm when attempting to thread shirt on.  Attempted to have pt lean forward in w/c to read R LE into pants, however, unable to reach. Assisted pt with obtaining and maintaining figure four position in attempt to thread pants, however, unsucessful and pt requiring max cuing for awareness of threading of pants into wrong pant hole.  He stood in [place] with max A, max cuing and VCs for erect and midline posture. He completed grooming tasks from w/c level at sink, able to locate all items from L side of sink. VCs to attend to L side of head when brushing hair.  In BI gym, pt completed [employer] activity focusing on anterior weight shift, sitting upright in w/c without back support. Completed x4 [name] with pt improving from average of 10 seconds to locate targets on L of midline, progressing to average of 3 seconds on last trial. Attempted to position L UE in weightbearing position on Pt's thigh, however, voiced pain with position, therefore arm positioned in lap.  Pt returned to room at end of session, education and demonstration provided for retrograde massage of L attention and edema management of L UE.  Pt left seated in w/c at end of session, QRB donned, L arm on lap tray and all needs in reach.   Therapy Documentation Precautions:  Precautions Precautions: Fall Precaution Comments:  neglect, severe L hemiparesis Restrictions Weight Bearing Restrictions: No Pain:  Voices pain in shoulder and wrist with WBing through L UE  See Function Navigator for Current Functional Status.   Therapy/Group: Individual Therapy  Callahan Wild L 06/19/2017, 6:54 AM

## 2017-06-19 NOTE — Progress Notes (Signed)
Physical Therapy Discharge Summary  Patient Details  Name: Derek Hays MRN: [id number] Date of Birth: 02/25/1931  Today's Date: 06/19/2017 PT Individual Time: 4268-3419 PT Individual Time Calculation (min): 40 min    Patient has met 6 of 7 long term goals due to improved activity tolerance, improved balance, improved postural control, increased strength, increased range of motion, decreased pain, ability to compensate for deficits, functional use of  left lower extremity, improved attention, improved awareness and improved coordination.  Patient to discharge at a wheelchair level [place] -max assist.   Patient's care partner unavailable to provide the necessary physical assistance at discharge.  Reasons goals not met: Continued proprioceptive and sensation deficits limits safe use of    Recommendation:  Patient will benefit from ongoing skilled PT services in skilled nursing facility setting to continue to advance safe functional mobility, address ongoing impairments in balance, strength, transfers, WC mobility, gait, bed mobility, safety, and minimize fall risk.  Equipment: No equipment provided  Reasons for discharge: discharge from hospital  Patient/family agrees with progress made and goals achieved: Yes   PT treatment:  PT instructed pt in Grad day assessment to measure progress toward goals. See below for details. Pt returned to room and performed squat pivot transfer to bed with mod assist. Sit>supine completed with mod assist, and pt left supine in bed with call bell in reach and all needs met.    PT Discharge Precautions/Restrictions   Vital Signs Therapy Vitals Temp: 98.6 F (37 C) Temp Source: Oral Pulse Rate: 60 BP: (!) 101/59 Patient Position (if appropriate): Lying Oxygen Therapy SpO2: 98 % O2 Device: Room Air Pain Pain Assessment Pain Assessment: No/denies pain Pain Score: 0-No pain Vision/Perception     Mild L neglect. Improved from Eval.   Cognition Overall Cognitive Status: Impaired/Different from baseline Arousal/Alertness: Awake/alert Orientation Level: Oriented to person;Oriented to place;Oriented to time Attention: Selective Selective Attention: Impaired Selective Attention Impairment: Verbal basic;Functional basic Memory: Impaired Memory Impairment: Decreased recall of new information;Prospective memory;Decreased short term memory Decreased Short Term Memory: Verbal basic;Functional basic Awareness: Impaired Awareness Impairment: Emergent impairment;Anticipatory impairment Problem Solving: Impaired Problem Solving Impairment: Verbal basic;Functional basic Executive Function: (all areas impacted by lower level deficits) Safety/Judgment: Impaired Sensation Sensation Light Touch: Impaired Detail Light Touch Impaired Details: Impaired LUE;Impaired LLE Proprioception: Impaired Detail Proprioception Impaired Details: Impaired LUE;Impaired LLE Coordination Gross Motor Movements are Fluid and Coordinated: No Fine Motor Movements are Fluid and Coordinated: No Coordination and Movement Description:  L hemiplegia Motor  Motor Motor: Hemiplegia;Abnormal tone;Abnormal postural alignment and control;Motor apraxia;Motor perseverations Motor - Discharge Observations: Dense L hemiplegia. mild improvements from eval   Mobility Bed Mobility Bed Mobility: Rolling Right;Rolling Left;Supine to Sit;Sit to Supine Rolling Right: 4: Min assist Rolling Left: 4: Min assist Supine to Sit: 3: Mod assist Sit to Supine: 3: Mod assist Transfers Transfers: Yes Sit to Stand: 2: Max assist Squat Pivot Transfers: 2: Max assist;3: Mod Risk manager Details: Verbal cues for technique;Verbal cues for gait pattern;Manual facilitation for placement;Verbal cues for safe use of DME/AE;Verbal cues for precautions/safety;Manual facilitation for weight shifting;Manual facilitation for weight bearing;Verbal cues for  sequencing Locomotion  Ambulation Ambulation: Yes Ambulation/Gait Assistance: 2: Max assist Ambulation Distance (Feet): 4 Feet Assistive device: Parallel bars Gait Gait: Yes Gait Pattern: Impaired Gait Pattern: Lateral hip instability;Lateral trunk lean to left;Narrow base of support;Poor foot clearance - left Stairs / Additional Locomotion Stairs: No Wheelchair Mobility Wheelchair Mobility: Yes Wheelchair Assistance: 5: Supervision Wheelchair Propulsion: Right upper extremity;Right  lower extremity Wheelchair Parts Management: Needs assistance Distance: 115  Trunk/Postural Assessment  Cervical Assessment Cervical Assessment: Exceptions to WFL(Head tilt R) Thoracic Assessment Thoracic Assessment: Exceptions to WFL(Kyphotic; rounded shoulders) Lumbar Assessment Lumbar Assessment: Exceptions to WFL(Posterior pelvic tilt) Postural Control Postural Control: Deficits on evaluation(delayed righting reactions when distracted to the L. )  Balance Balance Balance Assessed: Yes Static Sitting Balance Static Sitting - Balance Support: Right upper extremity supported Static Sitting - Level of Assistance: 5: Stand by assistance Dynamic Sitting Balance Dynamic Sitting - Balance Support: Right upper extremity supported Dynamic Sitting - Level of Assistance: 5: Stand by assistance;4: Min assist Sitting balance - Comments: Pushers syndrome  Static Standing Balance Static Standing - Balance Support: Right upper extremity supported Static Standing - Level of Assistance: 2: Max assist Dynamic Standing Balance Dynamic Standing - Balance Support: Right upper extremity supported Dynamic Standing - Level of Assistance: 2: Max assist;1: +1 Total assist Extremity Assessment      RLE Assessment RLE Assessment: Exceptions to [facility] RLE Strength RLE Overall Strength Comments: improved strength from Eval 4/5 proximal to distal LLE Assessment LLE Assessment: Exceptions to [facility] LLE Strength LLE  Overall Strength Comments: 3/5 hip extension. 3+/5 knee extention. 1/5 hip flexion and ankle DF. 2/5 PF.    See Function Navigator for Current Functional Status.  Derek Hays 06/19/2017, 4:06 PM

## 2017-06-19 NOTE — Progress Notes (Signed)
Social Work   Linn Goetze, Eliezer Champagne  Social Worker  Physical Medicine and Rehabilitation  Patient Care Conference  Signed  Date of Service:  06/19/2017  1:04 PM          Signed          [] Hide copied text  [] Hover for details   Inpatient RehabilitationTeam Conference and Plan of Care Update Date: 06/19/2017   Time: 10:50 AM      Patient Name: Derek Hays      Medical Record Number: [id number]  Date of Birth: 1930/12/16 Sex: Male         Room/Bed: 4W07C/4W07C-01 Payor Info: Payor: MEDICARE / Plan: MEDICARE PART A AND B / Product Type: *No Product type* /     Admitting Diagnosis: Rt CVA  Admit Date/Time:  05/30/2017  3:17 PM Admission Comments: No comment available    Primary Diagnosis:  <principal problem not specified> Principal Problem: <principal problem not specified>       Patient Active Problem List    Diagnosis Date Noted  . Acute blood loss anemia    . Stage 3 chronic kidney disease ([place])    . PAF (paroxysmal atrial fibrillation) ([place])    . Benign essential HTN    . Dysphagia, post-stroke    . 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 Stroke (cerebrum) ([place]) 05/27/2017      Expected Discharge Date: Expected Discharge Date: 06/20/17   Team Members Present: Physician leading conference: Dr. Alysia Penna Social Worker Present: Ovidio Kin, LCSW Nurse Present: Junius Creamer, RN PT Present: Phylliss Bob, PTA;Barrie Folk, PT OT Present: Napoleon Form, OT SLP Present: Stormy Fabian, SLP PPS Coordinator present : Daiva Nakayama, RN, CRRN       Current Status/Progress Goal Weekly Team Focus  Medical     Left hemiparesis and neglect, urinary retention with UTI  Improve urinary retention  med managemnt of urinary retention and UTI   Bowel/Bladder     continent of bowel  LBM 3-5, bladder scan q 4  in/out cath requiring cath twice a day incontient of urine  continent of bowel and  bladder maintain regular bowel pattern  Assist with tolieting needs prn, timed tolieting   Swallow/Nutrition/ Hydration     dysphagia 1 with thin liquids  Min A to supervision with least restrictive diet  upgrade to thin liquids, trials of dysphagia 2   ADL's     Guarding-min A sitting balanace; mod A sliding board transfers (+2 for placement and safety); mod A UB bathing/dressing; total A toileting in [place]; max A LB dressing  Mod-Max A overall      Mobility     mod/max bed mobilty with features, supervision sitting balance, standing tolerance up to 1 min, maxA x1 SB transfer, mod/maxA squat pivot, min guard to supervision w/c mobility   min-modA  transfers, w/c mobility, L NMR d/c planning   Communication               Safety/Cognition/ Behavioral Observations   Mod A for basic, Mod A for sustained attention, Max A for left attention  Max A   Basic problem solving, sustained attention   Pain     no complaints of pain   pain <2  Assess pain q shift and prn   Skin     bruise to groin healing   no new skin issues  Assess skin q shift and prn     *  See Care Plan and progress notes for long and short-term goals.      Barriers to Discharge   Current Status/Progress Possible Resolutions Date Resolved   Physician     Medical stability;Neurogenic Bowel & Bladder;Decreased caregiver support     minimal additional progress  Cont current rehab plan, will need SNF      Nursing                 PT                    OT                 SLP            SW              Discharge Planning/Teaching Needs:  Searching for a NH bed, FL2 is out and awaiting bed offer      Team Discussion:  Progressing slowly in therapies. Trials of Dys 2 may upgrade after lunch today. At times still I & O cahts required, continent of bowel. BP low MD aware and adjusting meds. Resting hand splint ordered for hand and shoulder pain. Awaiting k-pad. Working on attention and problem solving. NH bed for tomorrow    Revisions to Treatment Plan:  NH 3/7     Continued Need for Acute Rehabilitation Level of Care: The patient requires daily medical management by a physician with specialized training in physical medicine and rehabilitation for the following conditions: Daily direction of a multidisciplinary physical rehabilitation program to ensure safe treatment while eliciting the highest outcome that is of practical value to the patient.: Yes Daily medical management of patient stability for increased activity during participation in an intensive rehabilitation regime.: Yes Daily analysis of laboratory values and/or radiology reports with any subsequent need for medication adjustment of medical intervention for : Neurological problems;Urological problems   Tanisha Lutes, Gardiner Rhyme 06/19/2017, 1:04 PM                  Gretchen Weinfeld, Gardiner Rhyme, LCSW  Social Worker  Physical Medicine and Rehabilitation  Patient Care Conference  Signed  Date of Service:  06/12/2017 12:05 PM          Signed          [] Hide copied text  [] Hover for details   Inpatient RehabilitationTeam Conference and Plan of Care Update Date: 06/12/2017   Time: 10:45 AM      Patient Name: Derek Hays      Medical Record Number: [id number]  Date of Birth: 29-Oct-1930 Sex: Male         Room/Bed: 4W07C/4W07C-01 Payor Info: Payor: MEDICARE / Plan: MEDICARE PART A AND B / Product Type: *No Product type* /     Admitting Diagnosis: Rt CVA  Admit Date/Time:  05/30/2017  3:17 PM Admission Comments: No comment available    Primary Diagnosis:  <principal problem not specified> Principal Problem: <principal problem not specified>       Patient Active Problem List    Diagnosis Date Noted  . Acute blood loss anemia    . Stage 3 chronic kidney disease ([place])    . PAF (paroxysmal atrial fibrillation) ([place])    . Benign essential HTN    . Dysphagia, post-stroke    . 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 Middle  cerebral artery embolism, right 05/28/2017  . Stroke (cerebrum) ([place]) 05/27/2017      Expected Discharge Date: Expected  Discharge Date: 06/14/17   Team Members Present: Physician leading conference: Dr. Delice Lesch Social Worker Present: Ovidio Kin, LCSW Nurse Present: Isla Pence, RN PT Present: Barrie Folk, PT OT Present: Napoleon Form, OT SLP Present: Charolett Bumpers, SLP PPS Coordinator present : Daiva Nakayama, RN, CRRN       Current Status/Progress Goal Weekly Team Focus  Medical     Left hemiparesis, left neglect, left hemisensory deficits secondary to right MCA infarction/distal M1 segment occlusion status post revascularization as well as history of traumatic SDH September 2019 with follow-up CT the head 05/10/2017 showed SDH completely reabsorbed  Improve mobility, safety, cognition, CKD, HTN  See above   Bowel/Bladder     continent of bowel, LBM 2/27,with 2 senokot BID, pt had 3 bm's in the last 24 hours , inability to urinate with I&O caths q 8 hours for lower volumes, urocholine and pt is now incontinent of urine or voiding small amounts at times  maintain b/b with mod assist  monitor b/b q shift and prn, I&O cath q 8 hours or prn if volumes over 316ml   Swallow/Nutrition/ Hydration     Dys 1 and NTL  Min A to supervision with least restrictive diet  use of swallow strategies , trials thin    ADL's     Min-mod A sitting balance; mod-max (with +2 for safety) for sliding board; mod A UB bathing/dressing; total A +2 toileting using mechanical lift         Mobility     maxA bed mobility, min/modA sitting balance, maxA 1-2 SB transfer, mod/maxA x 2 sit to/from stand from elevated mat in [place], modA w/c mobility  min-modA  sitting balance, w/c mobility, L NMR, SB transfers   Communication               Safety/Cognition/ Behavioral Observations   Mod A  Min A with basic  basic problem solving, sustained attention, intellectual awareness    Pain     c/o pain to left shoulder at  times, tylenol prn, pt now has sling for left arm for comfort,   pain <2  monitor pain q shift and prn   Skin     large bruise to right groin healing, turning yellow, no other skin issues  maintain skin with min assist  monitor skin q shift and prn     *See Care Plan and progress notes for long and short-term goals.      Barriers to Discharge   Current Status/Progress Possible Resolutions Date Resolved   Physician     Neurogenic Bowel & Bladder;Decreased caregiver support;Medical stability     See above  Therapies, optimize Bp meds, follow labs      Nursing                 PT                    OT                 SLP            SW              Discharge Planning/Teaching Needs:  Wife is not able to provide the care pt will require from rehab will begin NH search.       Team Discussion:  Progressing slow and steady in therapies. Increased fluid intake and sitting balance better. Sling ordered for left arm for support. BP controlled and checking  labs in am. Dys 1 nectar thick diet due to attention and safety issues. Trials of water and dys 2 with speech therapy. Downgraded goals to mod/max level. Will begin NH search wife not able to manage pt at this level  Revisions to Treatment Plan:  NHP    Continued Need for Acute Rehabilitation Level of Care: The patient requires daily medical management by a physician with specialized training in physical medicine and rehabilitation for the following conditions: Daily direction of a multidisciplinary physical rehabilitation program to ensure safe treatment while eliciting the highest outcome that is of practical value to the patient.: Yes Daily medical management of patient stability for increased activity during participation in an intensive rehabilitation regime.: Yes Daily analysis of laboratory values and/or radiology reports with any subsequent need for medication adjustment of medical intervention for : Neurological problems;Urological  problems;Renal problems;Blood pressure problems   [name] 06/12/2017, 12:05 PM                  Azan Maneri, Gardiner Rhyme, LCSW  Social Worker  Physical Medicine and Rehabilitation  Patient Care Conference  Signed  Date of Service:  06/05/2017 12:47 PM          Signed          [] Hide copied text  [] Hover for details   Inpatient RehabilitationTeam Conference and Plan of Care Update Date: 06/05/2017   Time: 10:45 AM      Patient Name: Derek Hays      Medical Record Number: [id number]  Date of Birth: 1930/08/17 Sex: Male         Room/Bed: 4W07C/4W07C-01 Payor Info: Payor: MEDICARE / Plan: MEDICARE PART A AND B / Product Type: *No Product type* /     Admitting Diagnosis: Rt CVA  Admit Date/Time:  05/30/2017  3:17 PM Admission Comments: No comment available    Primary Diagnosis:  <principal problem not specified> Principal Problem: <principal problem not specified>       Patient Active Problem List    Diagnosis Date Noted  . 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 Stroke (cerebrum) ([place]) 05/27/2017      Expected Discharge Date: Expected Discharge Date: 06/14/17   Team Members Present: Physician leading conference: Dr. Alysia Penna Social Worker Present: Ovidio Kin, LCSW Nurse Present: Other (comment)(Denise Lloyd-RN) PT Present: Barrie Folk, PT;Rosita Dechalus, PTA OT Present: Napoleon Form, OT SLP Present: Stormy Fabian, SLP PPS Coordinator present : Daiva Nakayama, RN, CRRN       Current Status/Progress Goal Weekly Team Focus  Medical     Patient with severe left hemiparesis and left neglect.  Has awareness of deficits  Upgrade mobility to 1 person assist, maintain medical stability  Work on bladder emptying   Bowel/Bladder       Continence   Incontinent B & B-urinary rentension   Timed tolieting-MD bladder meds  Swallow/Nutrition/ Hydration     dysphagia 1 with nectar  thick liquids  Min A to supervision with least restrictive diet  consumption of dysphagia 1 with nectar trials of water with SLP only and Max A cues to use chin tuck   ADL's     Mod-max A sitting balance; +2 sliding board transfers; total A +2 LB dressing; max A UB bathing/dressing  Min A overall will likely downgrade due to slow pt progress  Static sitting banace, functional transfers, ADL re-training, neuro re-ed   Mobility     MaxA  bed mobility, max/maxA sitting balance, maxA x 2 SB transfers, total assist standing  min-modA  sitting balance, transfers, L NMR, d/c planning   Communication               Safety/Cognition/ Behavioral Observations   Mod A to Max A for basic problem solving, possibly d/t left inattention and no overall awareness  Min A with basic  basic problem solving, overall attention and continuation of tasks, intellectual awareness   Pain               Skin          monitor skin currently no issues       *See Care Plan and progress notes for long and short-term goals.      Barriers to Discharge   Current Status/Progress Possible Resolutions Date Resolved   Physician     Neurogenic Bowel & Bladder;Decreased caregiver support;Medical stability  Requiring intermittent catheterization, wife cannot provide physical assistance  Slow progress towards goals  Likely will need SNF      Nursing                 PT                    OT                 SLP Lack of/limited family support            SW              Discharge Planning/Teaching Needs:  Wife has health issues of her own and can not provide physical assist, will probably need NHP      Team Discussion:  Goals mod/max level of assist. Pt has had a severe CVA. I & O cathing required due to retention. MBS on Friday currently on Dys 1 nectar thick liquid diet. Pain in shoulder MD aware of. Currently total assist plus 2. Do not feel wife will be able to provide the care pt will require due to her own health  issues.  Revisions to Treatment Plan:  NHP    Continued Need for Acute Rehabilitation Level of Care: The patient requires daily medical management by a physician with specialized training in physical medicine and rehabilitation for the following conditions: Daily direction of a multidisciplinary physical rehabilitation program to ensure safe treatment while eliciting the highest outcome that is of practical value to the patient.: Yes Daily medical management of patient stability for increased activity during participation in an intensive rehabilitation regime.: Yes Daily analysis of laboratory values and/or radiology reports with any subsequent need for medication adjustment of medical intervention for : Neurological problems;Urological problems   [name] 06/07/2017, 8:56 AM                 Patient ID: [name], male   DOB: 1930-08-15, 84 y.o.   MRN: [id number]

## 2017-06-19 NOTE — Progress Notes (Signed)
Subjective/Complaints: Patient is without new complaints today.  He does have some shoulder pain did not receive his K pad yet.  Order has been placed. ROS: Appears to deny CP, SOB, nausea, vomiting, diarrhea  Objective: Vital Signs: Blood pressure 112/66, pulse (!) 59, temperature 98 F (36.7 C), temperature source Oral, resp. rate 16, height 6' (1.829 m), weight 101 kg (222 lb 10.6 oz), SpO2 98 %. No results found. Results for orders placed or performed during the hospital encounter of 05/30/17 (from the past 72 hour(s))  Basic metabolic panel     Status: Abnormal   Collection Time: 06/17/17  1:07 PM  Result Value Ref Range   Sodium 138 135 - 145 mmol/L   Potassium 4.5 3.5 - 5.1 mmol/L   Chloride 102 101 - 111 mmol/L   CO2 22 22 - 32 mmol/L   Glucose, Bld 144 (H) 65 - 99 mg/dL   BUN 21 (H) 6 - 20 mg/dL   Creatinine, Ser 1.39 (H) 0.61 - 1.24 mg/dL   Calcium 9.1 8.9 - 10.3 mg/dL   GFR calc non Af Amer 45 (L) >60 mL/min   GFR calc Af Amer 52 (L) >60 mL/min    Comment: (NOTE) The eGFR has been calculated using the CKD EPI equation. This calculation has not been validated in all clinical situations. eGFR's persistently <60 mL/min signify possible Chronic Kidney Disease.    Anion gap 14 5 - 15    Comment: Performed at [facility] [address] [place], [place] [address]     HENT: Normocephalic. Atraumatic. Eyes: No discharge. EOMI.  Cardio: RRR. No JVD  Resp: CTA Bilaterally. Normal effort   GI: BS positive and Nontender Skin:   Multiple ecchymoses left upper extremity Neuro: Alert/Oriented,  Motor: 0/5 left upper extremity biceps 2-/5 hip flexion, knee extensor, 0/5 distally Dysarthria Musc/Skel:  No edema. No tenderness. General no acute distress. Vital signs reviewed.   Assessment/Plan: 1. Functional deficits secondary to right MCA infarct with left hemiparesis left neglect left hemisensory deficits as well as cognitive deficits which require 3+ hours per  day of interdisciplinary therapy in a comprehensive inpatient rehab setting. Physiatrist is providing close team supervision and 24 hour management of active medical problems listed below. Physiatrist and rehab team continue to assess barriers to discharge/monitor patient progress toward functional and medical goals. FIM: Function - Bathing Position: Wheelchair/chair at sink Body parts bathed by patient: Abdomen, Chest, Right arm(UB only) Body parts bathed by helper: Left arm Assist Level: 2 helpers  Function- Upper Body Dressing/Undressing What is the patient wearing?: Pull over shirt/dress Pull over shirt/dress - Perfomed by patient: Thread/unthread right sleeve, Put head through opening Pull over shirt/dress - Perfomed by helper: Thread/unthread left sleeve, Pull shirt over trunk Assist Level: Touching or steadying assistance(Pt > 75%) Function - Lower Body Dressing/Undressing What is the patient wearing?: Pants, Socks, Shoes Position: Sitting EOB Pants- Performed by patient: Thread/unthread right pants leg Pants- Performed by helper: Thread/unthread left pants leg, Pull pants up/down Non-skid slipper socks- Performed by helper: Don/doff right sock, Don/doff left sock Socks - Performed by helper: Don/doff right sock, Don/doff left sock Shoes - Performed by helper: Don/doff right shoe, Don/doff left shoe, Fasten right, Fasten left Assist for footwear: Maximal assist Assist for lower body dressing: 2 Helpers(Standing inSTEDY)  Function - Toileting Toileting activity did not occur: Safety/medical concerns Toileting steps completed by helper: Adjust clothing prior to toileting, Performs perineal hygiene, Adjust clothing after toileting Toileting Assistive Devices: Other (comment)(steady)  Assist level: Two helpers  Function - Air cabin crew transfer activity did not occur: Safety/medical concerns Toilet transfer assistive device: Elevated toilet seat/BSC over toilet,  Mechanical lift Mechanical lift: Stedy Assist level to toilet: 2 helpers Assist level from toilet: 2 helpers Assist level to bedside commode (at bedside): 2 helpers Assist level from bedside commode (at bedside): 2 helpers  Function - Chair/bed transfer Chair/bed transfer method: Lateral scoot Chair/bed transfer assist level: Maximal assist (Pt 25 - 49%/lift and lower) Chair/bed transfer assistive device: Armrests, Sliding board  Function - Locomotion: Wheelchair Will patient use wheelchair at discharge?: Yes Type: Manual Wheelchair activity did not occur: Safety/medical concerns Max wheelchair distance: 59f  Assist Level: Supervision or verbal cues Wheel 50 feet with 2 turns activity did not occur: Safety/medical concerns Assist Level: Supervision or verbal cues Wheel 150 feet activity did not occur: Safety/medical concerns Function - Locomotion: Ambulation Ambulation activity did not occur: Safety/medical concerns Assistive device: Parallel bars Max distance: 4 Assist level: Maximal assist (Pt 25 - 49%) Walk 10 feet activity did not occur: Safety/medical concerns Walk 50 feet with 2 turns activity did not occur: Safety/medical concerns Walk 150 feet activity did not occur: Safety/medical concerns Walk 10 feet on uneven surfaces activity did not occur: Safety/medical concerns  Function - Comprehension Comprehension: Auditory Comprehension assistive device: Hearing aids Comprehension assist level: Understands basic 90% of the time/cues < 10% of the time  Function - Expression Expression: Verbal Expression assistive device: Talk trach valve Expression assist level: Expresses basic 75 - 89% of the time/requires cueing 10 - 24% of the time. Needs helper to occlude trach/needs to repeat words.  Function - Social Interaction Social Interaction assist level: Interacts appropriately 90% of the time - Needs monitoring or encouragement for participation or interaction.  Function -  Problem Solving Problem solving assist level: Solves basic 90% of the time/requires cueing < 10% of the time  Function - Memory Memory assist level: Recognizes or recalls 50 - 74% of the time/requires cueing 25 - 49% of the time Patient normally able to recall (first 3 days only): That he or she is in a hospital, Staff names and faces, Current season  Medical Problem List and Plan: 1.Left hemiparesis, left neglect, left hemisensory deficitssecondary to right MCA infarction/distal M1 segment occlusion status post revascularizationas well as history of traumatic SDH September 2019 with follow-up CT the head 05/10/2017 showed SDH completely reabsorbed.    Continue CIR PT, OT, speech,  Team conference today please see physician documentation under team conference tab, met with team face-to-face to discuss problems,progress, and goals. Formulized individual treatment plan based on medical history, underlying problem and comorbidities. 2. DVT Prophylaxis/Anticoagulation:ELIQUIS   5 mg twice a day no signs of bleeding  3. Pain Management:Lyrica 100 mg twice a day, Tylenol as needed, no neuropathic pain c/os 4. Mood:Provide emotional support 5. Neuropsych: This patientis? Fully capable of making decisions on hisown behalf. 6. Skin/Wound Care:Routine skin checks 7. Fluids/Electrolytes/Nutrition:Routine I&O's  8.Dysphagia. Dysphagia #1 nectar liquids.  -Intake is 680 mL yesterday consistent, low intake with lower BP check BMET ok    9.Hypertension. Tenormin 50 mg daily.   Vitals:   06/19/17 0206 06/19/17 0730  BP: 103/61 112/66  Pulse: 60 (!) 59  Resp: 16   Temp: 98 F (36.7 C)   SpO2: 98%     Controlled 3/6 running on low side will reduce tenormin as of 3/5, flomax may be lowering BP as well 10.Atrial fibrillation with history of pacemaker. Cardiac  rate controlled. Patient to follow-up with [facility].  Heart rate stable in the 60s, monitor on lower dose of  tenormin 11.Hypothyroidism. Synthroid 12.Hyperlipidemia. Zocor 13.  Constipation: Continue bowel regimen 14. CKD   Cr 1.31 on 2/18, 1.43 on 2/28   No sig change, enc fluids 15.  Neurogenic bladder resulting in urinary retention, likely element of BPH as well requiring intermittent catheterization.  Has UTI will start Keflex, increase Urecholine, continue Flomax, Culture showing 80K citrobacter sens to cipro 16.  Urinary retention improved on urecholine and flomax, however did have 400 mL bladder scan, 500 mL cath this a.m., increase Urecholine dose to 25 mg   LOS (Days) 20 A FACE TO FACE EVALUATION WAS PERFORMED  Derek Hays 06/19/2017, 8:56 AM

## 2017-06-19 NOTE — Progress Notes (Signed)
Speech Language Pathology Discharge Summary  Patient Details  Name: Derek Hays MRN: [id number] Date of Birth: 1930-06-12  Today's Date: 06/19/2017  Patient has met 4 of 4 long term goals.  Patient to discharge at overall Min;Mod;Max level.    Clinical Impression/Discharge Summary:   Pt has made progress and is now consuming thin liquids via cup sips. He has been successfully tolerating trials of dysphagia 2 without oral residue and would benefit from further skilled observation with full meal tray of dysphagia 2 with thin liquids. Pt is highly distractible, requires Max A to scan to his left and demonstrates decreased ability to perform ADLs without Mod A assistance.   Care Partner:  Caregiver Able to Provide Assistance: No  Type of Caregiver Assistance: Physical;Cognitive  Recommendation:  Skilled Nursing facility  Rationale for SLP Follow Up: Maximize cognitive function and independence;Maximize swallowing safety;Reduce caregiver burden   Equipment:     Reasons for discharge: Discharged from hospital   Patient/Family Agrees with Progress Made and Goals Achieved: Yes   Function:  Eating Eating   Modified Consistency Diet: No(Trials with SLP) Eating Assist Level: Set up assist for;Supervision or verbal cues   Eating Set Up Assist For: Opening containers       Cognition Comprehension Comprehension assist level: Understands basic 75 - 89% of the time/ requires cueing 10 - 24% of the time;Understands basic 90% of the time/cues < 10% of the time  Expression   Expression assist level: Expresses basic 75 - 89% of the time/requires cueing 10 - 24% of the time. Needs helper to occlude trach/needs to repeat words.  Social Interaction Social Interaction assist level: Interacts appropriately 75 - 89% of the time - Needs redirection for appropriate language or to initiate interaction.  Problem Solving Problem solving assist level: Solves basic 75 - 89% of the time/requires cueing 10  - 24% of the time  Memory Memory assist level: Recognizes or recalls 50 - 74% of the time/requires cueing 25 - 49% of the time   [name] 06/19/2017, 12:18 PM

## 2017-06-19 NOTE — Progress Notes (Signed)
Physical Therapy Session Note  Patient Details  Name: GRAYTON LOBO MRN: [id number] Date of Birth: [birth year]  Today's Date: 06/19/2017 PT Individual Time: 1100-1200 PT Individual Time Calculation (min): 60 min   Short Term Goals: Week 3:    STG =LTG due to ELOS  Skilled Therapeutic Interventions/Progress Updates:   Pt received sitting in WC and agreeable to PT  Pt transported to rehab gym in [facility].   Blocked practice partial sit<>stand with push from [facility] and from mat table x 10 with max progressing to mod assist. Max cues for set up and sequencing.   Blocked pratice Squat pivot transfers to the R and L x 5 each with Max-mad assist from PT with max multimodal cues for sequencing and set up.    PT instructed Pt in [place] mobility in hall x 137f with supervision assist moderate cues for use of   Nustep reciprocal movement training x 5 minutes with BLE only and LLE thigh support. Pt noted to have improved activation of the LLE on this day into extension.   [facility] and all needs met.        Therapy Documentation Precautions:  Precautions Precautions: Fall Precaution Comments:  neglect, severe L hemiparesis Restrictions Weight Bearing Restrictions: No   Pain: Denies.   See Function Navigator for Current Functional Status.   Therapy/Group: Individual Therapy  ALorie Phenix3/09/2017, 1:19 PM

## 2017-06-19 NOTE — Progress Notes (Signed)
Orthopedic Tech Progress Note Patient Details:  Derek Hays [birth year] [id number]  Patient ID: [name], male   DOB: 31-May-1930, 84 y.o.   MRN: [id number]   [name] 06/19/2017, 9:13 AM Called in hanger brace order; spoke with Caryl Pina

## 2017-06-19 NOTE — Progress Notes (Signed)
Occupational Therapy Discharge Summary  Patient Details  Name: Derek Hays MRN: [id number] Date of Birth: [birth year]   Patient has met 6 of 7 long term goals due to improved activity tolerance, improved balance, postural control, ability to compensate for deficits, improved attention, improved awareness and improved coordination.  Patient to discharge at [facility] Max Assist level.  Patient's care partner is unable to provide the needed physical and cognitive assist at d/c and therefore pt to d/c to SNF in order to continue to increase independence with ADLs and reduce caregiver burden.   Reasons goals not met: Pt requires total A for LB dressing, standing with use of STEDY with max A to have pants pulled up.  Pt is able to participate and assist with 3/4 aspects of UB dressing, however, requires VCs and min A to complete 3/4 steps of task.  Recommend use of mechanical lift to BSC/toilet. Max A sliding board transfer completed to [facility], however, requires use of mechanical lift in order to stand for clothing management/ hygiene.   Recommendation:  Patient will benefit from ongoing skilled OT services in skilled nursing facility setting to continue to advance functional skills in the area of BADL and Reduce care partner burden.  Equipment: To be determined at next venue of care  Reasons for discharge: discharge from hospital  Patient/family agrees with progress made and goals achieved: Yes  OT Discharge Precautions/Restrictions  Precautions Precautions: Fall Precaution Comments: Dense L hemiplegia Required Braces or Orthoses: Sling(Wears L UE sling for comfort/ support during transfers only) Restrictions Weight Bearing Restrictions: No Vision Baseline Vision/History: Wears glasses Wears Glasses: At all times Patient Visual Report: No change from baseline Vision Assessment?: No apparent visual deficits Alignment/Gaze Preference: Head turned;Gaze right Visual Fields: Left homonymous  hemianopsia;Left visual field deficit Additional Comments: L inattention and field cut Perception  Perception: Impaired Inattention/Neglect: Does not attend to left side of body;Does not attend to left visual field Praxis Praxis: Impaired Praxis Impairment Details: Initiation;Perseveration;Motor planning Cognition Overall Cognitive Status: Impaired/Different from baseline Arousal/Alertness: Awake/alert Orientation Level: Oriented to person;Oriented to place;Oriented to time Attention: Selective Selective Attention: Impaired Selective Attention Impairment: Verbal basic;Functional basic Memory: Appears intact Memory Impairment: Decreased recall of new information;Prospective memory;Decreased short term memory Decreased Short Term Memory: Verbal basic;Functional basic Awareness: Impaired Awareness Impairment: Emergent impairment;Anticipatory impairment Problem Solving: Impaired Problem Solving Impairment: Verbal basic;Functional basic Executive Function: (All areas impaired due to lower level deficits) Safety/Judgment: Impaired Sensation Sensation Light Touch: Impaired Detail Light Touch Impaired Details: Impaired LUE;Impaired LLE Stereognosis: Impaired Detail Stereognosis Impaired Details: Absent LUE Proprioception: Impaired Detail Proprioception Impaired Details: Impaired LUE;Impaired LLE Coordination Gross Motor Movements are Fluid and Coordinated: No Fine Motor Movements are Fluid and Coordinated: No Coordination and Movement Description: Dense L hemiplegia Motor  Motor Motor: Hemiplegia;Abnormal tone;Abnormal postural alignment and control;Motor apraxia;Motor perseverations Motor - Discharge Observations: Dense L hemiplegia. mild improvements from eval  Mobility  Bed Mobility Bed Mobility: Rolling Right;Rolling Left;Supine to Sit;Sit to Supine Rolling Right: 4: Min assist Rolling Left: 4: Min assist Supine to Sit: 3: Mod assist Sit to Supine: 3: Mod  assist Transfers Sit to Stand: 2: Max assist  Trunk/Postural Assessment  Cervical Assessment Cervical Assessment: Exceptions to WFL(R head tilt due to gaze preference) Thoracic Assessment Thoracic Assessment: Exceptions to WFL(Kyphotic; rounded shoulders) Lumbar Assessment Lumbar Assessment: Exceptions to [facility] Postural Control Postural Control: Deficits on evaluation(Delayed)  Balance Balance Balance Assessed: Yes Static Sitting Balance Static Sitting - Balance Support: No upper extremity supported;Feet supported Static Sitting -  Level of Assistance: 5: Stand by assistance;4: Min assist Static Sitting - Comment/# of Minutes: Sitting EOB Dynamic Sitting Balance Dynamic Sitting - Balance Support: No upper extremity supported;Feet unsupported Dynamic Sitting - Level of Assistance: 5: Stand by assistance;4: Min assist Sitting balance - Comments: Pushers syndrome  Static Standing Balance Static Standing - Balance Support: Right upper extremity supported Static Standing - Level of Assistance: 2: Max assist Dynamic Standing Balance Dynamic Standing - Balance Support: Right upper extremity supported Dynamic Standing - Level of Assistance: 2: Max assist;1: +1 Total assist Extremity/Trunk Assessment RUE Assessment RUE Assessment: Within Functional Limits LUE Assessment LUE Assessment: Exceptions to WFL(Trace movements elbow and shoulder flexion gravity eliminated; 0/5 all other muscle groups)   See Function Navigator for Current Functional Status.  Wendell Fiebig L 06/19/2017, 5:17 PM

## 2017-06-19 NOTE — Patient Care Conference (Signed)
Inpatient RehabilitationTeam Conference and Plan of Care Update Date: 06/19/2017   Time: 10:50 AM    Patient Name: Derek Hays      Medical Record Number: [id number]  Date of Birth: 1931-01-30 Sex: Male         Room/Bed: 4W07C/4W07C-01 Payor Info: Payor: MEDICARE / Plan: MEDICARE PART A AND B / Product Type: *No Product type* /    Admitting Diagnosis: Rt CVA  Admit Date/Time:  05/30/2017  3:17 PM Admission Comments: No comment available   Primary Diagnosis:  <principal problem not specified> Principal Problem: <principal problem not specified>  Patient Active Problem List   Diagnosis Date Noted  . Acute blood loss anemia   . Stage 3 chronic kidney disease ([place])   . PAF (paroxysmal atrial fibrillation) ([place])   . Benign essential HTN   . Dysphagia, post-stroke   . 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 Stroke (cerebrum) ([place]) 05/27/2017    Expected Discharge Date: Expected Discharge Date: 06/20/17  Team Members Present: Physician leading conference: Dr. Alysia Penna Social Worker Present: Ovidio Kin, LCSW Nurse Present: Junius Creamer, RN PT Present: Phylliss Bob, PTA;Barrie Folk, PT OT Present: Napoleon Form, OT SLP Present: Stormy Fabian, SLP PPS Coordinator present : Daiva Nakayama, RN, CRRN     Current Status/Progress Goal Weekly Team Focus  Medical   Left hemiparesis and neglect, urinary retention with UTI  Improve urinary retention  med managemnt of urinary retention and UTI   Bowel/Bladder   continent of bowel  LBM 3-5, bladder scan q 4  in/out cath requiring cath twice a day incontient of urine  continent of bowel and bladder maintain regular bowel pattern  Assist with tolieting needs prn, timed tolieting   Swallow/Nutrition/ Hydration   dysphagia 1 with thin liquids  Min A to supervision with least restrictive diet  upgrade to thin liquids, trials of dysphagia 2   ADL's   Guarding-min A  sitting balanace; mod A sliding board transfers (+2 for placement and safety); mod A UB bathing/dressing; total A toileting in STEDY; max A LB dressing  Mod-Max A overall      Mobility   mod/max bed mobilty with features, supervision sitting balance, standing tolerance up to 1 min, maxA x1 SB transfer, mod/maxA squat pivot, min guard to supervision w/c mobility   min-modA  transfers, w/c mobility, L NMR d/c planning   Communication             Safety/Cognition/ Behavioral Observations  Mod A for basic, Mod A for sustained attention, Max A for left attention  Max A   Basic problem solving, sustained attention   Pain   no complaints of pain   pain <2  Assess pain q shift and prn   Skin   bruise to groin healing   no new skin issues  Assess skin q shift and prn      *See Care Plan and progress notes for long and short-term goals.     Barriers to Discharge  Current Status/Progress Possible Resolutions Date Resolved   Physician    Medical stability;Neurogenic Bowel & Bladder;Decreased caregiver support     minimal additional progress  Cont current rehab plan, will need SNF      Nursing                  PT  OT                  SLP                SW                Discharge Planning/Teaching Needs:  Searching for a NH bed, FL2 is out and awaiting bed offer      Team Discussion:  Progressing slowly in therapies. Trials of Dys 2 may upgrade after lunch today. At times still I & O cahts required, continent of bowel. BP low MD aware and adjusting meds. Resting hand splint ordered for hand and shoulder pain. Awaiting k-pad. Working on attention and problem solving. NH bed for tomorrow  Revisions to Treatment Plan:  NH 3/7     Continued Need for Acute Rehabilitation Level of Care: The patient requires daily medical management by a physician with specialized training in physical medicine and rehabilitation for the following conditions: Daily direction of a  multidisciplinary physical rehabilitation program to ensure safe treatment while eliciting the highest outcome that is of practical value to the patient.: Yes Daily medical management of patient stability for increased activity during participation in an intensive rehabilitation regime.: Yes Daily analysis of laboratory values and/or radiology reports with any subsequent need for medication adjustment of medical intervention for : Neurological problems;Urological problems  Catilyn Boggus, Gardiner Rhyme 06/19/2017, 1:04 PM

## 2017-06-20 ENCOUNTER — Ambulatory Visit (HOSPITAL_COMMUNITY): Payer: TRICARE For Life (TFL) | Admitting: Speech Pathology

## 2017-06-20 ENCOUNTER — Inpatient Hospital Stay (HOSPITAL_COMMUNITY): Payer: TRICARE For Life (TFL) | Admitting: Occupational Therapy

## 2017-06-20 DIAGNOSIS — I639 Cerebral infarction, unspecified: Secondary | ICD-10-CM | POA: Diagnosis not present

## 2017-06-20 DIAGNOSIS — R600 Localized edema: Secondary | ICD-10-CM | POA: Diagnosis not present

## 2017-06-20 DIAGNOSIS — M6281 Muscle weakness (generalized): Secondary | ICD-10-CM | POA: Diagnosis not present

## 2017-06-20 DIAGNOSIS — I69391 Dysphagia following cerebral infarction: Secondary | ICD-10-CM | POA: Diagnosis not present

## 2017-06-20 DIAGNOSIS — E861 Hypovolemia: Secondary | ICD-10-CM | POA: Diagnosis present

## 2017-06-20 DIAGNOSIS — I69354 Hemiplegia and hemiparesis following cerebral infarction affecting left non-dominant side: Secondary | ICD-10-CM | POA: Diagnosis not present

## 2017-06-20 DIAGNOSIS — R1312 Dysphagia, oropharyngeal phase: Secondary | ICD-10-CM | POA: Diagnosis not present

## 2017-06-20 DIAGNOSIS — Z87891 Personal history of nicotine dependence: Secondary | ICD-10-CM | POA: Diagnosis not present

## 2017-06-20 DIAGNOSIS — N390 Urinary tract infection, site not specified: Secondary | ICD-10-CM | POA: Diagnosis not present

## 2017-06-20 DIAGNOSIS — I480 Paroxysmal atrial fibrillation: Secondary | ICD-10-CM | POA: Diagnosis not present

## 2017-06-20 DIAGNOSIS — I69822 Dysarthria following other cerebrovascular disease: Secondary | ICD-10-CM | POA: Diagnosis not present

## 2017-06-20 DIAGNOSIS — R823 Hemoglobinuria: Secondary | ICD-10-CM | POA: Diagnosis present

## 2017-06-20 DIAGNOSIS — I129 Hypertensive chronic kidney disease with stage 1 through stage 4 chronic kidney disease, or unspecified chronic kidney disease: Secondary | ICD-10-CM | POA: Diagnosis not present

## 2017-06-20 DIAGNOSIS — K5900 Constipation, unspecified: Secondary | ICD-10-CM | POA: Diagnosis not present

## 2017-06-20 DIAGNOSIS — I5030 Unspecified diastolic (congestive) heart failure: Secondary | ICD-10-CM | POA: Diagnosis present

## 2017-06-20 DIAGNOSIS — R1310 Dysphagia, unspecified: Secondary | ICD-10-CM | POA: Diagnosis present

## 2017-06-20 DIAGNOSIS — R062 Wheezing: Secondary | ICD-10-CM | POA: Diagnosis not present

## 2017-06-20 DIAGNOSIS — Z950 Presence of cardiac pacemaker: Secondary | ICD-10-CM | POA: Diagnosis not present

## 2017-06-20 DIAGNOSIS — Z79890 Hormone replacement therapy (postmenopausal): Secondary | ICD-10-CM | POA: Diagnosis not present

## 2017-06-20 DIAGNOSIS — Z66 Do not resuscitate: Secondary | ICD-10-CM | POA: Diagnosis present

## 2017-06-20 DIAGNOSIS — H3530 Unspecified macular degeneration: Secondary | ICD-10-CM | POA: Diagnosis not present

## 2017-06-20 DIAGNOSIS — G464 Cerebellar stroke syndrome: Secondary | ICD-10-CM | POA: Diagnosis not present

## 2017-06-20 DIAGNOSIS — R278 Other lack of coordination: Secondary | ICD-10-CM | POA: Diagnosis not present

## 2017-06-20 DIAGNOSIS — I110 Hypertensive heart disease with heart failure: Secondary | ICD-10-CM | POA: Diagnosis not present

## 2017-06-20 DIAGNOSIS — R293 Abnormal posture: Secondary | ICD-10-CM | POA: Diagnosis not present

## 2017-06-20 DIAGNOSIS — Z515 Encounter for palliative care: Secondary | ICD-10-CM | POA: Diagnosis not present

## 2017-06-20 DIAGNOSIS — Z79899 Other long term (current) drug therapy: Secondary | ICD-10-CM | POA: Diagnosis not present

## 2017-06-20 DIAGNOSIS — R4020 Unspecified coma: Secondary | ICD-10-CM | POA: Diagnosis not present

## 2017-06-20 DIAGNOSIS — I6601 Occlusion and stenosis of right middle cerebral artery: Secondary | ICD-10-CM | POA: Diagnosis not present

## 2017-06-20 DIAGNOSIS — Z8673 Personal history of transient ischemic attack (TIA), and cerebral infarction without residual deficits: Secondary | ICD-10-CM | POA: Diagnosis not present

## 2017-06-20 DIAGNOSIS — N400 Enlarged prostate without lower urinary tract symptoms: Secondary | ICD-10-CM | POA: Diagnosis present

## 2017-06-20 DIAGNOSIS — E785 Hyperlipidemia, unspecified: Secondary | ICD-10-CM | POA: Diagnosis not present

## 2017-06-20 DIAGNOSIS — M25562 Pain in left knee: Secondary | ICD-10-CM | POA: Diagnosis not present

## 2017-06-20 DIAGNOSIS — H409 Unspecified glaucoma: Secondary | ICD-10-CM | POA: Diagnosis not present

## 2017-06-20 DIAGNOSIS — Z7901 Long term (current) use of anticoagulants: Secondary | ICD-10-CM | POA: Diagnosis not present

## 2017-06-20 DIAGNOSIS — M25512 Pain in left shoulder: Secondary | ICD-10-CM | POA: Diagnosis not present

## 2017-06-20 DIAGNOSIS — I4891 Unspecified atrial fibrillation: Secondary | ICD-10-CM | POA: Diagnosis not present

## 2017-06-20 DIAGNOSIS — R0989 Other specified symptoms and signs involving the circulatory and respiratory systems: Secondary | ICD-10-CM | POA: Diagnosis not present

## 2017-06-20 DIAGNOSIS — N183 Chronic kidney disease, stage 3 (moderate): Secondary | ICD-10-CM | POA: Diagnosis present

## 2017-06-20 DIAGNOSIS — I69315 Cognitive social or emotional deficit following cerebral infarction: Secondary | ICD-10-CM | POA: Diagnosis not present

## 2017-06-20 DIAGNOSIS — G9340 Encephalopathy, unspecified: Secondary | ICD-10-CM | POA: Diagnosis present

## 2017-06-20 DIAGNOSIS — G8194 Hemiplegia, unspecified affecting left nondominant side: Secondary | ICD-10-CM | POA: Diagnosis present

## 2017-06-20 DIAGNOSIS — R339 Retention of urine, unspecified: Secondary | ICD-10-CM | POA: Diagnosis not present

## 2017-06-20 DIAGNOSIS — I509 Heart failure, unspecified: Secondary | ICD-10-CM | POA: Diagnosis not present

## 2017-06-20 DIAGNOSIS — J90 Pleural effusion, not elsewhere classified: Secondary | ICD-10-CM | POA: Diagnosis not present

## 2017-06-20 DIAGNOSIS — M79652 Pain in left thigh: Secondary | ICD-10-CM | POA: Diagnosis not present

## 2017-06-20 DIAGNOSIS — N320 Bladder-neck obstruction: Secondary | ICD-10-CM | POA: Diagnosis not present

## 2017-06-20 DIAGNOSIS — R031 Nonspecific low blood-pressure reading: Secondary | ICD-10-CM | POA: Diagnosis not present

## 2017-06-20 DIAGNOSIS — H04129 Dry eye syndrome of unspecified lacrimal gland: Secondary | ICD-10-CM | POA: Diagnosis not present

## 2017-06-20 DIAGNOSIS — A419 Sepsis, unspecified organism: Secondary | ICD-10-CM | POA: Diagnosis not present

## 2017-06-20 DIAGNOSIS — R0602 Shortness of breath: Secondary | ICD-10-CM | POA: Diagnosis not present

## 2017-06-20 DIAGNOSIS — I63511 Cerebral infarction due to unspecified occlusion or stenosis of right middle cerebral artery: Secondary | ICD-10-CM | POA: Diagnosis not present

## 2017-06-20 DIAGNOSIS — I130 Hypertensive heart and chronic kidney disease with heart failure and stage 1 through stage 4 chronic kidney disease, or unspecified chronic kidney disease: Secondary | ICD-10-CM | POA: Diagnosis present

## 2017-06-20 DIAGNOSIS — I69321 Dysphasia following cerebral infarction: Secondary | ICD-10-CM | POA: Diagnosis not present

## 2017-06-20 DIAGNOSIS — R29810 Facial weakness: Secondary | ICD-10-CM | POA: Diagnosis present

## 2017-06-20 DIAGNOSIS — Z8551 Personal history of malignant neoplasm of bladder: Secondary | ICD-10-CM | POA: Diagnosis not present

## 2017-06-20 DIAGNOSIS — I10 Essential (primary) hypertension: Secondary | ICD-10-CM | POA: Diagnosis not present

## 2017-06-20 DIAGNOSIS — J189 Pneumonia, unspecified organism: Secondary | ICD-10-CM | POA: Diagnosis not present

## 2017-06-20 DIAGNOSIS — E039 Hypothyroidism, unspecified: Secondary | ICD-10-CM | POA: Diagnosis present

## 2017-06-20 DIAGNOSIS — R4182 Altered mental status, unspecified: Secondary | ICD-10-CM | POA: Diagnosis not present

## 2017-06-20 NOTE — Progress Notes (Signed)
Report given to Percell Miller at [facility]. Pt will be leaving by ambulance. All questions answered. Claude Manges, LPN

## 2017-06-20 NOTE — Progress Notes (Signed)
Social Work Patient ID: Derek Hays, male   DOB: 1931/04/14, 84 y.o.   MRN: [id number] Spoke with wife to inform her to contact [facility] to set up time for paperwork this am, so pt can go this afternoon. Working on transferring to facility after lunch.

## 2017-06-20 NOTE — Progress Notes (Signed)
Social Work  Discharge Note  The overall goal for the admission was met for:   Discharge location: Yes-[facility]  Length of Stay: Yes-21 DAYS  Discharge activity level: Yes-MOD/MAX LEVEL  Home/community participation: Yes  Services provided included: MD, RD, PT, OT, SLP, RN, CM, TR, Pharmacy and SW  Financial Services: Medicare and Private Insurance: TRICARE  Follow-up services arranged: Other: NHP  Comments (or additional information):WIFE NOT ABLE TO PROVIDE THE LEVEL OF CARE PT REQUIRES AT THIS TIME, NEEDS MORE REHAB BEFORE GOING HOME  Patient/Family verbalized understanding of follow-up arrangements: Yes  Individual responsible for coordination of the follow-up plan: ROSA-WIFE  Confirmed correct DME delivered: [name] 06/20/2017    [name]

## 2017-06-23 DIAGNOSIS — I110 Hypertensive heart disease with heart failure: Secondary | ICD-10-CM | POA: Diagnosis not present

## 2017-06-23 DIAGNOSIS — I509 Heart failure, unspecified: Secondary | ICD-10-CM | POA: Diagnosis not present

## 2017-06-23 DIAGNOSIS — R1310 Dysphagia, unspecified: Secondary | ICD-10-CM | POA: Diagnosis not present

## 2017-06-23 DIAGNOSIS — I69391 Dysphagia following cerebral infarction: Secondary | ICD-10-CM | POA: Diagnosis not present

## 2017-06-23 DIAGNOSIS — E785 Hyperlipidemia, unspecified: Secondary | ICD-10-CM | POA: Diagnosis not present

## 2017-06-23 DIAGNOSIS — I69354 Hemiplegia and hemiparesis following cerebral infarction affecting left non-dominant side: Secondary | ICD-10-CM | POA: Diagnosis not present

## 2017-06-23 DIAGNOSIS — E039 Hypothyroidism, unspecified: Secondary | ICD-10-CM | POA: Diagnosis not present

## 2017-06-23 DIAGNOSIS — N320 Bladder-neck obstruction: Secondary | ICD-10-CM | POA: Diagnosis not present

## 2017-06-23 DIAGNOSIS — I4891 Unspecified atrial fibrillation: Secondary | ICD-10-CM | POA: Diagnosis not present

## 2017-07-11 ENCOUNTER — Encounter: Payer: No Typology Code available for payment source | Attending: Physical Medicine & Rehabilitation

## 2017-07-11 ENCOUNTER — Ambulatory Visit (HOSPITAL_BASED_OUTPATIENT_CLINIC_OR_DEPARTMENT_OTHER): Payer: Medicare Other | Admitting: Physical Medicine & Rehabilitation

## 2017-07-11 ENCOUNTER — Encounter: Payer: Self-pay | Admitting: Physical Medicine & Rehabilitation

## 2017-07-11 VITALS — BP 126/75 | HR 60 | Ht 72.0 in | Wt 207.0 lb

## 2017-07-11 DIAGNOSIS — I639 Cerebral infarction, unspecified: Secondary | ICD-10-CM

## 2017-07-11 DIAGNOSIS — Z950 Presence of cardiac pacemaker: Secondary | ICD-10-CM | POA: Diagnosis not present

## 2017-07-11 DIAGNOSIS — M25512 Pain in left shoulder: Secondary | ICD-10-CM | POA: Diagnosis not present

## 2017-07-11 DIAGNOSIS — I10 Essential (primary) hypertension: Secondary | ICD-10-CM | POA: Diagnosis not present

## 2017-07-11 DIAGNOSIS — I4891 Unspecified atrial fibrillation: Secondary | ICD-10-CM | POA: Insufficient documentation

## 2017-07-11 DIAGNOSIS — Z87891 Personal history of nicotine dependence: Secondary | ICD-10-CM | POA: Insufficient documentation

## 2017-07-11 DIAGNOSIS — I69354 Hemiplegia and hemiparesis following cerebral infarction affecting left non-dominant side: Secondary | ICD-10-CM

## 2017-07-11 DIAGNOSIS — R600 Localized edema: Secondary | ICD-10-CM | POA: Diagnosis not present

## 2017-07-11 NOTE — Patient Instructions (Addendum)
Recommend heating pad to Left shoulder  Please practice standing in PT with patient  OT instruct retrograde massage for Left hand edema

## 2017-07-11 NOTE — Progress Notes (Signed)
Subjective:    Patient ID: Derek Hays, male    DOB: [birth year], 84 y.o.   MRN: [id number] 84 year old right-handed male with history of hypertension, atrial fibrillation with pacemaker had been maintained on Eliquis, discontinued due to traumatic subdural hematoma in September 2018.  Repeat head CT scan on May 10, 2017, showed SDH completely reabsorbed, followup by Dr. Bridgette Habermann at [facility].  He lives with spouse, independent with a cane and a walker prior to admission.  Presented on May 27, 2017, with acute onset of left-sided weakness and slurred speech.  CT of the head showed hyperdense right MCA.  No hemorrhage or mass effect.  Subacute appearing left PCA territory infarct.  CT cerebral perfusion scan, emergent large vessel occlusion of the distal M1 segment of the right middle cerebral artery, underwent revascularization.  Echocardiogram with ejection fraction of 37%, grade 2 diastolic dysfunction.  MRI and MRA, acute MCA infarction, right insula, and frontal parietal lobe.  Small acute subacute infarct, left parietal lobe.  MRA with revascularization of occlusion.  Maintained on aspirin and Eliquis.  Resume 10 days post stroke due to size of infarction.  He was on a dysphagia #1 thin thick liquid diet.  The patient was admitted for a comprehensive rehab program.  HPI Patient has completed inpatient rehabilitation at [facility] on 06/20/2017 He is at [facility] facility.  He is receiving PT OT and speech but not at the same intensity as during the inpatient rehabilitation stay.  I discussed this with his wife and this is what is expected from a skilled nursing facility. Patient has not had any falls or any injuries there.  He does have some left hand swelling.  He voids incontinently into a diaper. Pain Inventory Average Pain 6 Pain Right Now 2 My pain is intermittent and aching  In the last 24 hours, has pain interfered with the  following? General activity 2 Relation with others 2 Enjoyment of life 2 What TIME of day is your pain at its worst? morning Sleep (in general) Good  Pain is worse with: movement Pain improves with: rest and heat/ice Relief from Meds: .  Mobility ability to climb steps?  no do you drive?  no use a wheelchair needs help with transfers  Function retired  Neuro/Psych bladder control problems trouble walking  Prior Studies Any changes since last visit?  no  Physicians involved in your care Any changes since last visit?  no   No family history on file. Social History   Socioeconomic History  . Marital status: Married    Spouse name: Not on file  . Number of children: Not on file  . Years of education: Not on file  . Highest education level: Not on file  Occupational History    Employer: [employer]  Social Needs  . Financial resource strain: Not on file  . Food insecurity:    Worry: Not on file    Inability: Not on file  . Transportation needs:    Medical: Not on file    Non-medical: Not on file  Tobacco Use  . Smoking status: Former Smoker    Packs/day: 1.50    Years: 25.00    Pack years: 37.50    Types: Cigarettes    Last attempt to quit: 06/12/1982    Years since quitting: 35.1  . Smokeless tobacco: Never Used  . Tobacco comment: pt states he quit when dx with bladder ca  Substance and Sexual Activity  .  Alcohol use: Yes  . Drug use: No  . Sexual activity: Not on file  Lifestyle  . Physical activity:    Days per week: Not on file    Minutes per session: Not on file  . Stress: To some extent  Relationships  . Social connections:    Talks on phone: More than three times a week    Gets together: Twice a week    Attends religious service: Not on file    Active member of club or organization: Not on file    Attends meetings of clubs or organizations: Never    Relationship status: Not on file  Other Topics Concern  . Not on file  Social History  Narrative  . Not on file   Past Surgical History:  Procedure Laterality Date  . IR CT HEAD LTD  05/27/2017  . IR PERCUTANEOUS ART THROMBECTOMY/INFUSION INTRACRANIAL INC DIAG ANGIO  05/27/2017  . RADIOLOGY WITH ANESTHESIA N/A 05/27/2017   Procedure: RADIOLOGY WITH ANESTHESIA;  Surgeon: Luanne Bras, MD;  Location: [place];  Service: Radiology;  Laterality: N/A;   Past Medical History:  Diagnosis Date  . Atrial fibrillation ([place])   . Bladder cancer ([place]) 1984  . Hypertension    BP 126/75   Pulse 60   Ht 6' (1.829 m) Comment: states  Wt 207 lb (93.9 kg) Comment: states  SpO2 96%   BMI 28.07 kg/m   Opioid Risk Score:   Fall Risk Score:  `1  Depression screen PHQ 2/9  No flowsheet data found.   Review of Systems  Constitutional: Positive for chills.  HENT: Positive for sinus pressure, sneezing and sore throat.   Eyes: Negative.   Respiratory: Negative.   Cardiovascular: Negative.   Gastrointestinal: Negative.   Endocrine: Negative.   Genitourinary: Positive for difficulty urinating and dysuria.  Musculoskeletal: Positive for arthralgias, gait problem and myalgias.  Skin: Negative.   Allergic/Immunologic: Negative.   Hematological: Negative.   Psychiatric/Behavioral: Negative.   All other systems reviewed and are negative.      Objective:   Physical Exam  Constitutional: He is oriented to person, place, and time. He appears well-developed and well-nourished.  HENT:  Head: Normocephalic and atraumatic.  Cardiovascular: Normal heart sounds. An irregularly irregular rhythm present.  No murmur heard. Musculoskeletal:       Right shoulder: He exhibits decreased range of motion.       Left shoulder: He exhibits decreased range of motion and pain. He exhibits no tenderness.  Pain with abduction of the left shoulder at around 70 degrees Left dorsum of the hand swelling no skin lesions.  He has no pain with wrist or finger range of motion or elbow range of motion.    Neurological: He is alert and oriented to person, place, and time. No sensory deficit. He exhibits abnormal muscle tone. Coordination and gait normal.  Flaccid left upper extremity with exception of some tone at the forearm pronators. Motor strength is 0/5 in the deltoid bicep tricep grip 3- hip knee extensor synergy 0 at the ankle.  Reduced sensation left upper extremity  Positive left neglect  Psychiatric: He has a normal mood and affect. His speech is normal. He is slowed.  Nursing note and vitals reviewed.      Assessment & Plan:  1.  Large right MCA infarct with left hemiparesis left neglect  Patient has had some lower extremity motor return on the left side but nothing in the upper extremity. Would ask physical therapy to  do more standing activities to promote weightbearing to the left lower limb  Patient has dependent edema left hand and wrist and needs elevation as well as retrograde massage.  2.  Left shoulder pain post stroke likely multifactorial he has some signs of impingement syndrome has some subluxation.  Positioning will be important he may benefit from laboratory.  Medicine rehab follow-up in 6 weeks

## 2017-08-05 ENCOUNTER — Telehealth: Payer: Self-pay | Admitting: Neurology

## 2017-08-05 ENCOUNTER — Ambulatory Visit (INDEPENDENT_AMBULATORY_CARE_PROVIDER_SITE_OTHER): Payer: Medicare Other | Admitting: Adult Health

## 2017-08-05 ENCOUNTER — Encounter: Payer: Self-pay | Admitting: Adult Health

## 2017-08-05 VITALS — BP 159/95 | HR 60 | Ht 72.0 in | Wt 218.0 lb

## 2017-08-05 DIAGNOSIS — I10 Essential (primary) hypertension: Secondary | ICD-10-CM

## 2017-08-05 DIAGNOSIS — E785 Hyperlipidemia, unspecified: Secondary | ICD-10-CM | POA: Diagnosis not present

## 2017-08-05 DIAGNOSIS — I639 Cerebral infarction, unspecified: Secondary | ICD-10-CM | POA: Diagnosis not present

## 2017-08-05 DIAGNOSIS — I63511 Cerebral infarction due to unspecified occlusion or stenosis of right middle cerebral artery: Secondary | ICD-10-CM

## 2017-08-05 DIAGNOSIS — I480 Paroxysmal atrial fibrillation: Secondary | ICD-10-CM | POA: Diagnosis not present

## 2017-08-05 DIAGNOSIS — I6601 Occlusion and stenosis of right middle cerebral artery: Secondary | ICD-10-CM

## 2017-08-05 MED ORDER — PREGABALIN 150 MG PO CAPS: 150.0000 mg | ORAL_CAPSULE | Freq: Two times a day (BID) | ORAL | 3 refills | Status: DC

## 2017-08-05 NOTE — Patient Instructions (Signed)
Continue Eliquis (apixaban) daily  and simvastatin  for secondary stroke prevention  Okay to take Tylenol for general muscle aches and pains  Continue to work to PT/OT/ST   Increase lyrica from 100mg  twice a day to 150mg  twice a day for continued nerve pain  Maintain strict control of hypertension with blood pressure goal below 130/90, diabetes with hemoglobin A1c goal below 6.5% and cholesterol with LDL cholesterol (bad cholesterol) goal below 70 mg/dL. I also advised the patient to eat a healthy diet with plenty of whole grains, cereals, fruits and vegetables, exercise regularly and maintain ideal body weight.  Followup in the future with me in 2 months or call earlier if needed

## 2017-08-05 NOTE — Progress Notes (Signed)
[facility] [address] [place]. [place] [address] [phone]       OFFICE FOLLOW UP NOTE  [name] Date of Birth:  1930/09/06 Medical Record Number:  [id number]   Reason for Referral:  hospital stroke follow up  CHIEF COMPLAINT:  Chief Complaint  Patient presents with  . Follow-up    pt with wife, pt states this is his first stroke. he is having severe pain in the left leg which is the leg that is unable to move. they have questions about if this is normal.  he is having constant urinary tract infections. 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     HPI: DAMANY Hays is being seen today for initial visit in the office for right MCA, left MCA and left PCA infarcts on 05/27/17. History obtained from paitent and chart review. Reviewed all radiology images and labs personally.  [name] is an 84 year old male history of A. fib status post pacemaker not on AC due to recent SDH, hypertension admitted for acute onset of left-sided weakness and right gaze on 05/27/2017.  No TPA was given due to left PCA in the right MCA hypodensity on CT scan.  CTA showed emergent large vessel occlusion in the distal M1 segment of the right MCA.  Patient underwent thrombectomy on 05/27/2017 with occluded right MCA which was tolerated well without any complications.  MRI brain showed acute right MCA infarct involving the right insula and frontal parietal lobe along with a small acute or subacute infarct in left parietal lobe, subacute infarct in left occipital lobe.  MRA showed persistent right M2 high-grade stenosis status post endovascular treatment.  2D echo showed EF of 60 to 65% and negative for cardiac source of emboli.  LDL 77 and A1c 5.4.  As previous traumatic SDH resolved, it was recommended that patient restart Eliquis 10 days post stroke due to large size of infarct.  Resumed PTA medication Zocor 40 mg.Patient discharged to CIR in stable condition.  Since  discharge, patient has been experiencing a slow recovery.  He is currently at [facility] where he receives PT/OT/ST. Wife is concerned that he is not receiving adequate therapy sessions as he has not been making progress.  He continues to have moderate left hemiparesis and is currently sitting in a wheelchair due to being unable to ambulate.  Also has complaints of left leg pain and describes this as a burning and shooting sensation. Currently taking Lyrica 100 mg twice daily.  Continues to take Eliquis without side effects of bleeding or bruising.  Continues to take Zocor without side effects of myalgias.  Blood pressure mildly elevated at 159/95.  Per wife, his blood pressure is typically lower at the facility.  Denies new or worsening stroke/TIA symptoms.  ROS:   14 system review of systems performed and negative with exception of joint pain, swelling, weakness, and numbness  PMH:  Past Medical History:  Diagnosis Date  . Atrial fibrillation ([place])   . Bladder cancer ([place]) 1984  . Hypertension   . Stroke [facility])     PSH:  Past Surgical History:  Procedure Laterality Date  . IR CT HEAD LTD  05/27/2017  . IR PERCUTANEOUS ART THROMBECTOMY/INFUSION INTRACRANIAL INC DIAG ANGIO  05/27/2017  . PACEMAKER INSERTION    . RADIOLOGY WITH ANESTHESIA N/A 05/27/2017   Procedure: RADIOLOGY WITH ANESTHESIA;  Surgeon: Luanne Bras, MD;  Location: [facility];  Service: Radiology;  Laterality: N/A;  Social History:  Social History   Socioeconomic History  . Marital status: Married    Spouse name: Not on file  . Number of children: Not on file  . Years of education: Not on file  . Highest education level: Not on file  Occupational History    Employer: [employer]  Social Needs  . Financial resource strain: Not on file  . Food insecurity:    Worry: Not on file    Inability: Not on file  . Transportation needs:    Medical: Not on file    Non-medical: Not on file  Tobacco Use  . Smoking  status: Former Smoker    Packs/day: 1.50    Years: 25.00    Pack years: 37.50    Types: Cigarettes    Last attempt to quit: 06/12/1982    Years since quitting: 35.1  . Smokeless tobacco: Never Used  . Tobacco comment: pt states he quit when dx with bladder ca  Substance and Sexual Activity  . Alcohol use: Not Currently  . Drug use: No  . Sexual activity: Not on file  Lifestyle  . Physical activity:    Days per week: Not on file    Minutes per session: Not on file  . Stress: To some extent  Relationships  . Social connections:    Talks on phone: More than three times a week    Gets together: Twice a week    Attends religious service: Not on file    Active member of club or organization: Not on file    Attends meetings of clubs or organizations: Never    Relationship status: Not on file  . Intimate partner violence:    Fear of current or ex partner: No    Emotionally abused: No    Physically abused: No    Forced sexual activity: No  Other Topics Concern  . Not on file  Social History Narrative  . Not on file    Family History:  Family History  Problem Relation Age of Onset  . Emphysema Father     Medications:   Current Outpatient Medications on File Prior to Visit  Medication Sig Dispense Refill  . apixaban (ELIQUIS) 5 MG TABS tablet Take 1 tablet (5 mg total) by mouth 2 (two) times daily. 60 tablet   . bethanechol (URECHOLINE) 25 MG tablet Take 1 tablet (25 mg total) by mouth 3 (three) times daily.    . ciprofloxacin (CIPRO) 250 MG tablet Take 1 tablet (250 mg total) by mouth 2 (two) times daily.    [name] Dextran 70-Hypromellose, PF, (ARTIFICIAL TEARS PF) 0.1-0.3 % SOLN Place 1 drop into both eyes every 6 (six) hours as needed (for dry eyes).    . furosemide (LASIX) 20 MG tablet Take 20 mg by mouth daily as needed for fluid.    [name] latanoprost (XALATAN) 0.005 % ophthalmic solution Place 1 drop into both eyes at bedtime.    [name] levothyroxine (SYNTHROID, LEVOTHROID) 112 MCG  tablet Take 112 mcg by mouth daily before breakfast.    . simvastatin (ZOCOR) 40 MG tablet Take 40 mg by mouth at bedtime.    . tamsulosin (FLOMAX) 0.4 MG CAPS capsule Take 1 capsule (0.4 mg total) by mouth daily after supper. 30 capsule   . timolol (TIMOPTIC-XE) 0.5 % ophthalmic gel-forming Place 1 drop into the left eye every morning.     No current facility-administered medications on file prior to visit.     Allergies:  No Known  Allergies   Physical Exam  Vitals:   08/05/17 1348  BP: (!) 159/95  Pulse: 60  Weight: 218 lb (98.9 kg)  Height: 6' (1.829 m)   Body mass index is 29.57 kg/m. No exam data present  General: well developed, pleasant elderly Caucasian male, well nourished, seated, in no evident distress Head: head normocephalic and atraumatic.   Neck: supple with no carotid or supraclavicular bruits Cardiovascular: regular rate and rhythm, no murmurs Musculoskeletal: no deformity Skin:  no rash/petichiae Vascular:  Normal pulses all extremities  Neurologic Exam Mental Status: Awake and fully alert. Oriented to place and time. Recent and remote memory intact. Attention span, concentration and fund of knowledge appropriate. Mood and affect appropriate.  Cranial Nerves: Fundoscopic exam reveals sharp disc margins. Pupils equal, briskly reactive to light. Extraocular movements full without nystagmus. Visual fields full to confrontation. Hearing intact. Facial sensation intact. Face, tongue, palate moves normally and symmetrically.  Motor: 0/5 left upper and lower extremity; negative for spasticity; 5/5 strength right upper and lower extremity Sensory.:  Decreased sensation bilaterally distal lower extremities, left leg sensation decreased up to thigh Coordination: Rapid alternating movements normal on right side. finger-to-nose and heel-to-shin performed accurately on right side Gait and Station: Patient currently wheelchair-bound -unable to test gait Reflexes: 1+ and  symmetric. Toes downgoing.    NIHSS  9 Modified Rankin  4 HAS-BLED 3 CHA2DS2-VASc 6   Diagnostic Data (Labs, Imaging, Testing)  CT head WO contrast 05/27/2017 IMPRESSION: 1. Positive for hyperdense right MCA. Hypodensity in the right frontal lobe appears mostly confined to the white matter. ASPECTS is 9. No hemorrhage or mass effect. 2. Subacute appearing left PCA territory infarct with no hemorrhage or mass effect.  CTA head/neck CT cerebral perfusion 05/27/2017 IMPRESSION: 1. Emergent large vessel occlusion of the distal M1 segment of the right middle cerebral artery. Very poor collateralization throughout the right MCA territory. 2. Right MCA territory infarct of 52 mL with 108 mL ischemic penumbra. 3. No midline shift or other mass effect.  No acute hemorrhage. 4. Hila convexity meningioma. 5. Carotid and aortic atherosclerosis (ICD10-I70.0) without hemodynamically significant stenosis of the carotid or vertebral Arteries.  IR percutaneous arterial thrombectomy  05/27/2017 IMPRESSION: Status post endovascular revascularization of occluded right middle cerebral artery with 5 passes with retrieval devices primarily the Solitaire FR 4 mm x 40 mm retrieval device, and once with the Trevo ProVue 4 mm x 30 mm retrieval device achieving a TICI 2b reperfusion. Underline narrowing and attenuation of the M2 M3 branches of the right middle cerebral artery suggestive of intracranial arteriosclerosis.  MRI brain WO contrast MRA head WO contrast 05/28/2017 IMPRESSION: Acute right MCA infarct involving the right insula and frontal parietal lobe. Negative for hemorrhage.  Small acute or subacute infarct left parietal lobe. Subacute infarct left occipital lobe. Findings suggest emboli.  Status post revascularization of right middle cerebral artery occlusion. There remains moderate to severe stenosis proximal right M2 segment which may be due to underlying atherosclerotic  disease.  2D echocardiogram 05/28/2017 Study Conclusions - Left ventricle: The cavity size was normal. Wall thickness was   increased in a pattern of mild LVH. Systolic function was normal.   The estimated ejection fraction was in the range of 60% to 65%.   Wall motion was normal; there were no regional wall motion   abnormalities. Features are consistent with a pseudonormal left   ventricular filling pattern, with concomitant abnormal relaxation   and increased filling pressure (grade 2 diastolic dysfunction). -  Aortic valve: Trileaflet; mildly thickened, mildly calcified   leaflets. - Aorta: The aorta was mildly calcified. - Mitral valve: There was trivial regurgitation. - Left atrium: The atrium was moderately dilated. - Right ventricle: The cavity size was mildly dilated. Wall   thickness was normal. Pacer wire or catheter noted in right   ventricle. - Right atrium: The atrium was mildly dilated. - Tricuspid valve: There was mild regurgitation. Impressions: - No cardiac source of emboli was indentified.    ASSESSMENT: QUARON DELACRUZ is a 84 y.o. year old male here with  multiple infarct stroke on 05/27/2017 secondary to atrial fibrillation not on [facility]. Vascular risk factors include atrial fibrillation, HTN, and HLD.    PLAN: -Continue Eliquis (apixaban) daily  and Zocor for secondary stroke prevention -Advised can take Tylenol for generalized muscle aches and pains -Continue PT/OT/ST -Increase Lyrica 150 mg twice daily for nerve pain -F/u with PCP regarding your HLD and HTN management -continue to monitor BP at home -Maintain strict control of hypertension with blood pressure goal below 130/90, diabetes with hemoglobin A1c goal below 6.5% and cholesterol with LDL cholesterol (bad cholesterol) goal below 70 mg/dL. I also advised the patient to eat a healthy diet with plenty of whole grains, cereals, fruits and vegetables, exercise regularly and maintain ideal body  weight.  Follow up in 2 months or call earlier if needed  Greater than 50% time during this 45 minute consultation visit was spent on counseling and coordination of care about HTN, and HLD, discussion about risk benefit of anticoagulation and answering questions.  Extensive conversation regarding wife and patient not being happy with current nursing home placement and possible options for transfer. It was also discussed that as patient is 84 y.o., his recovery may be slower and it is hard to determine how much recovery he may make. Patient and wife understand.    Venancio Poisson, AGNP-BC  [facility] [address] [place], [place] [address]  Phone [phone] Fax [phone]

## 2017-08-05 NOTE — Telephone Encounter (Signed)
Called the wife and informed her that after discussing with one of our physicians about her concern with the nursing facility he recommends that the wife schedule an apt with their director at the facility. At that meeting she can discuss the concerns she has and maybe come up with a plan where they can help with getting the patient to the restroom or increase his therapy regimen. If after that conversation they feel that a transfer should take place then the SW can help facilitating that. Patient wife verbalized understanding

## 2017-08-06 NOTE — Progress Notes (Signed)
I agree with the above plan 

## 2017-08-10 ENCOUNTER — Inpatient Hospital Stay (HOSPITAL_COMMUNITY): Payer: Medicare Other

## 2017-08-10 ENCOUNTER — Emergency Department (HOSPITAL_COMMUNITY): Payer: Medicare Other

## 2017-08-10 ENCOUNTER — Other Ambulatory Visit: Payer: Self-pay

## 2017-08-10 ENCOUNTER — Inpatient Hospital Stay (HOSPITAL_COMMUNITY)
Admission: EM | Admit: 2017-08-10 | Discharge: 2017-08-13 | DRG: 689 | Disposition: A | Discharge status: Home / Self Care | Payer: Medicare Other | Attending: Family Medicine | Admitting: Family Medicine

## 2017-08-10 ENCOUNTER — Encounter (HOSPITAL_COMMUNITY): Payer: Self-pay | Admitting: Emergency Medicine

## 2017-08-10 DIAGNOSIS — N390 Urinary tract infection, site not specified: Secondary | ICD-10-CM | POA: Diagnosis not present

## 2017-08-10 DIAGNOSIS — J189 Pneumonia, unspecified organism: Secondary | ICD-10-CM | POA: Diagnosis not present

## 2017-08-10 DIAGNOSIS — H409 Unspecified glaucoma: Secondary | ICD-10-CM | POA: Diagnosis not present

## 2017-08-10 DIAGNOSIS — Z7901 Long term (current) use of anticoagulants: Secondary | ICD-10-CM

## 2017-08-10 DIAGNOSIS — I69822 Dysarthria following other cerebrovascular disease: Secondary | ICD-10-CM | POA: Diagnosis not present

## 2017-08-10 DIAGNOSIS — Z79890 Hormone replacement therapy (postmenopausal): Secondary | ICD-10-CM

## 2017-08-10 DIAGNOSIS — R5381 Other malaise: Secondary | ICD-10-CM | POA: Diagnosis not present

## 2017-08-10 DIAGNOSIS — I480 Paroxysmal atrial fibrillation: Secondary | ICD-10-CM | POA: Diagnosis present

## 2017-08-10 DIAGNOSIS — R1310 Dysphagia, unspecified: Secondary | ICD-10-CM | POA: Diagnosis present

## 2017-08-10 DIAGNOSIS — R293 Abnormal posture: Secondary | ICD-10-CM | POA: Diagnosis not present

## 2017-08-10 DIAGNOSIS — I5030 Unspecified diastolic (congestive) heart failure: Secondary | ICD-10-CM | POA: Diagnosis present

## 2017-08-10 DIAGNOSIS — N400 Enlarged prostate without lower urinary tract symptoms: Secondary | ICD-10-CM | POA: Diagnosis present

## 2017-08-10 DIAGNOSIS — Z8551 Personal history of malignant neoplasm of bladder: Secondary | ICD-10-CM | POA: Diagnosis not present

## 2017-08-10 DIAGNOSIS — E861 Hypovolemia: Secondary | ICD-10-CM | POA: Diagnosis present

## 2017-08-10 DIAGNOSIS — I130 Hypertensive heart and chronic kidney disease with heart failure and stage 1 through stage 4 chronic kidney disease, or unspecified chronic kidney disease: Secondary | ICD-10-CM | POA: Diagnosis present

## 2017-08-10 DIAGNOSIS — R1312 Dysphagia, oropharyngeal phase: Secondary | ICD-10-CM | POA: Diagnosis not present

## 2017-08-10 DIAGNOSIS — R0602 Shortness of breath: Secondary | ICD-10-CM

## 2017-08-10 DIAGNOSIS — R29810 Facial weakness: Secondary | ICD-10-CM | POA: Diagnosis present

## 2017-08-10 DIAGNOSIS — G8190 Hemiplegia, unspecified affecting unspecified side: Secondary | ICD-10-CM | POA: Diagnosis not present

## 2017-08-10 DIAGNOSIS — I69315 Cognitive social or emotional deficit following cerebral infarction: Secondary | ICD-10-CM | POA: Diagnosis not present

## 2017-08-10 DIAGNOSIS — A419 Sepsis, unspecified organism: Secondary | ICD-10-CM | POA: Diagnosis not present

## 2017-08-10 DIAGNOSIS — R4182 Altered mental status, unspecified: Secondary | ICD-10-CM | POA: Diagnosis present

## 2017-08-10 DIAGNOSIS — Z7409 Other reduced mobility: Secondary | ICD-10-CM | POA: Diagnosis not present

## 2017-08-10 DIAGNOSIS — Z515 Encounter for palliative care: Secondary | ICD-10-CM | POA: Diagnosis not present

## 2017-08-10 DIAGNOSIS — Z79899 Other long term (current) drug therapy: Secondary | ICD-10-CM

## 2017-08-10 DIAGNOSIS — E039 Hypothyroidism, unspecified: Secondary | ICD-10-CM | POA: Diagnosis present

## 2017-08-10 DIAGNOSIS — R031 Nonspecific low blood-pressure reading: Secondary | ICD-10-CM | POA: Diagnosis not present

## 2017-08-10 DIAGNOSIS — H04129 Dry eye syndrome of unspecified lacrimal gland: Secondary | ICD-10-CM | POA: Diagnosis not present

## 2017-08-10 DIAGNOSIS — Z66 Do not resuscitate: Secondary | ICD-10-CM | POA: Diagnosis present

## 2017-08-10 DIAGNOSIS — R278 Other lack of coordination: Secondary | ICD-10-CM | POA: Diagnosis not present

## 2017-08-10 DIAGNOSIS — Z87891 Personal history of nicotine dependence: Secondary | ICD-10-CM

## 2017-08-10 DIAGNOSIS — Z8673 Personal history of transient ischemic attack (TIA), and cerebral infarction without residual deficits: Secondary | ICD-10-CM | POA: Diagnosis not present

## 2017-08-10 DIAGNOSIS — I69391 Dysphagia following cerebral infarction: Secondary | ICD-10-CM | POA: Diagnosis not present

## 2017-08-10 DIAGNOSIS — N183 Chronic kidney disease, stage 3 (moderate): Secondary | ICD-10-CM | POA: Diagnosis present

## 2017-08-10 DIAGNOSIS — H353290 Exudative age-related macular degeneration, unspecified eye, stage unspecified: Secondary | ICD-10-CM | POA: Diagnosis not present

## 2017-08-10 DIAGNOSIS — R339 Retention of urine, unspecified: Secondary | ICD-10-CM | POA: Diagnosis not present

## 2017-08-10 DIAGNOSIS — I129 Hypertensive chronic kidney disease with stage 1 through stage 4 chronic kidney disease, or unspecified chronic kidney disease: Secondary | ICD-10-CM | POA: Diagnosis not present

## 2017-08-10 DIAGNOSIS — G9340 Encephalopathy, unspecified: Secondary | ICD-10-CM | POA: Diagnosis present

## 2017-08-10 DIAGNOSIS — I69354 Hemiplegia and hemiparesis following cerebral infarction affecting left non-dominant side: Secondary | ICD-10-CM | POA: Diagnosis not present

## 2017-08-10 DIAGNOSIS — J90 Pleural effusion, not elsewhere classified: Secondary | ICD-10-CM | POA: Diagnosis not present

## 2017-08-10 DIAGNOSIS — K5900 Constipation, unspecified: Secondary | ICD-10-CM | POA: Diagnosis not present

## 2017-08-10 DIAGNOSIS — M6281 Muscle weakness (generalized): Secondary | ICD-10-CM | POA: Diagnosis not present

## 2017-08-10 DIAGNOSIS — E785 Hyperlipidemia, unspecified: Secondary | ICD-10-CM | POA: Diagnosis not present

## 2017-08-10 DIAGNOSIS — G464 Cerebellar stroke syndrome: Secondary | ICD-10-CM | POA: Diagnosis not present

## 2017-08-10 DIAGNOSIS — G8194 Hemiplegia, unspecified affecting left nondominant side: Secondary | ICD-10-CM | POA: Diagnosis present

## 2017-08-10 DIAGNOSIS — I69321 Dysphasia following cerebral infarction: Secondary | ICD-10-CM | POA: Diagnosis not present

## 2017-08-10 DIAGNOSIS — R823 Hemoglobinuria: Secondary | ICD-10-CM | POA: Diagnosis present

## 2017-08-10 DIAGNOSIS — R4020 Unspecified coma: Secondary | ICD-10-CM | POA: Diagnosis not present

## 2017-08-10 DIAGNOSIS — N401 Enlarged prostate with lower urinary tract symptoms: Secondary | ICD-10-CM | POA: Diagnosis not present

## 2017-08-10 DIAGNOSIS — Z950 Presence of cardiac pacemaker: Secondary | ICD-10-CM | POA: Diagnosis not present

## 2017-08-10 LAB — MAGNESIUM: Magnesium: 1.9 mg/dL (ref 1.7–2.4)

## 2017-08-10 LAB — CBC WITH DIFFERENTIAL/PLATELET
BASOS PCT: 0 %
Basophils Absolute: 0 10*3/uL (ref 0.0–0.1)
Eosinophils Absolute: 0.1 10*3/uL (ref 0.0–0.7)
Eosinophils Relative: 1 %
HEMATOCRIT: 39.3 % (ref 39.0–52.0)
HEMOGLOBIN: 12.7 g/dL — AB (ref 13.0–17.0)
Lymphocytes Relative: 14 %
Lymphs Abs: 1 10*3/uL (ref 0.7–4.0)
MCH: 28.5 pg (ref 26.0–34.0)
MCHC: 32.3 g/dL (ref 30.0–36.0)
MCV: 88.1 fL (ref 78.0–100.0)
Monocytes Absolute: 0.6 10*3/uL (ref 0.1–1.0)
Monocytes Relative: 8 %
NEUTROS ABS: 5.4 10*3/uL (ref 1.7–7.7)
NEUTROS PCT: 77 %
Platelets: 179 10*3/uL (ref 150–400)
RBC: 4.46 MIL/uL (ref 4.22–5.81)
RDW: 14.5 % (ref 11.5–15.5)
WBC: 7.1 10*3/uL (ref 4.0–10.5)

## 2017-08-10 LAB — I-STAT ARTERIAL BLOOD GAS, ED
Acid-base deficit: 2 mmol/L (ref 0.0–2.0)
Bicarbonate: 23.8 mmol/L (ref 20.0–28.0)
O2 Saturation: 92 %
PCO2 ART: 44.6 mmHg (ref 32.0–48.0)
TCO2: 25 mmol/L (ref 22–32)
pH, Arterial: 7.335 — ABNORMAL LOW (ref 7.350–7.450)
pO2, Arterial: 70 mmHg — ABNORMAL LOW (ref 83.0–108.0)

## 2017-08-10 LAB — I-STAT CG4 LACTIC ACID, ED
Lactic Acid, Venous: 1.18 mmol/L (ref 0.5–1.9)
Lactic Acid, Venous: 0.67 mmol/L (ref 0.5–1.9)

## 2017-08-10 LAB — URINALYSIS, ROUTINE W REFLEX MICROSCOPIC
Bilirubin Urine: NEGATIVE
GLUCOSE, UA: NEGATIVE mg/dL
KETONES UR: NEGATIVE mg/dL
NITRITE: NEGATIVE
PH: 5 (ref 5.0–8.0)
PROTEIN: 30 mg/dL — AB
RBC / HPF: >50 RBC/hpf — ABNORMAL HIGH (ref 0–5)
Specific Gravity, Urine: 1.011 (ref 1.005–1.030)

## 2017-08-10 LAB — PROTIME-INR
INR: 1.69
Prothrombin Time: 19.8 seconds — ABNORMAL HIGH (ref 11.4–15.2)

## 2017-08-10 LAB — TSH: TSH: 2.656 u[IU]/mL (ref 0.350–4.500)

## 2017-08-10 LAB — COMPREHENSIVE METABOLIC PANEL
ALK PHOS: 74 U/L (ref 38–126)
ALT: 14 U/L — ABNORMAL LOW (ref 17–63)
ANION GAP: 8 (ref 5–15)
AST: 17 U/L (ref 15–41)
Albumin: 2.6 g/dL — ABNORMAL LOW (ref 3.5–5.0)
BILIRUBIN TOTAL: 0.8 mg/dL (ref 0.3–1.2)
BUN: 24 mg/dL — ABNORMAL HIGH (ref 6–20)
CALCIUM: 8.2 mg/dL — AB (ref 8.9–10.3)
CO2: 27 mmol/L (ref 22–32)
Chloride: 104 mmol/L (ref 101–111)
Creatinine, Ser: 1.53 mg/dL — ABNORMAL HIGH (ref 0.61–1.24)
GFR, EST AFRICAN AMERICAN: 46 mL/min — AB (ref >60)
GFR, EST NON AFRICAN AMERICAN: 40 mL/min — AB (ref >60)
Glucose, Bld: 105 mg/dL — ABNORMAL HIGH (ref 65–99)
Potassium: 3.9 mmol/L (ref 3.5–5.1)
SODIUM: 139 mmol/L (ref 135–145)
TOTAL PROTEIN: 6 g/dL — AB (ref 6.5–8.1)

## 2017-08-10 LAB — I-STAT TROPONIN, ED: Troponin i, poc: 0.02 ng/mL (ref 0.00–0.08)

## 2017-08-10 LAB — PHOSPHORUS: Phosphorus: 3.9 mg/dL (ref 2.5–4.6)

## 2017-08-10 MED ORDER — SODIUM CHLORIDE 0.9 % IV BOLUS
1000.0000 mL | Freq: Once | INTRAVENOUS | Status: AC
  Administered 2017-08-10: 1000 mL via INTRAVENOUS

## 2017-08-10 MED ORDER — TAMSULOSIN HCL 0.4 MG PO CAPS
0.4000 mg | ORAL_CAPSULE | Freq: Every day | ORAL | Status: DC
  Administered 2017-08-10 – 2017-08-13 (×4): 0.4 mg via ORAL
  Filled 2017-08-10 (×4): qty 1

## 2017-08-10 MED ORDER — LEVOTHYROXINE SODIUM 112 MCG PO TABS
112.0000 ug | ORAL_TABLET | Freq: Every day | ORAL | Status: DC
  Administered 2017-08-12 – 2017-08-13 (×2): 112 ug via ORAL
  Filled 2017-08-10 (×2): qty 1

## 2017-08-10 MED ORDER — VANCOMYCIN HCL 10 G IV SOLR
1250.0000 mg | INTRAVENOUS | Status: DC
  Filled 2017-08-10: qty 1250

## 2017-08-10 MED ORDER — NALOXONE HCL 0.4 MG/ML IJ SOLN
INTRAMUSCULAR | Status: AC
  Administered 2017-08-10: 12:00:00 via INTRAVENOUS
  Filled 2017-08-10: qty 1

## 2017-08-10 MED ORDER — SODIUM CHLORIDE 0.9 % IV SOLN
INTRAVENOUS | Status: DC
  Administered 2017-08-10 – 2017-08-11 (×4): via INTRAVENOUS

## 2017-08-10 MED ORDER — PIPERACILLIN-TAZOBACTAM 3.375 G IVPB 30 MIN
3.3750 g | Freq: Once | INTRAVENOUS | Status: AC
  Administered 2017-08-10: 3.375 g via INTRAVENOUS
  Filled 2017-08-10: qty 50

## 2017-08-10 MED ORDER — HEPARIN SODIUM (PORCINE) 5000 UNIT/ML IJ SOLN
5000.0000 [IU] | Freq: Three times a day (TID) | INTRAMUSCULAR | Status: DC
  Administered 2017-08-10 – 2017-08-11 (×2): 5000 [IU] via SUBCUTANEOUS
  Filled 2017-08-10 (×2): qty 1

## 2017-08-10 MED ORDER — TIMOLOL MALEATE 0.5 % OP SOLN
1.0000 [drp] | Freq: Every morning | OPHTHALMIC | Status: DC
  Administered 2017-08-11 – 2017-08-13 (×3): 1 [drp] via OPHTHALMIC
  Filled 2017-08-10: qty 5

## 2017-08-10 MED ORDER — VANCOMYCIN HCL IN DEXTROSE 1-5 GM/200ML-% IV SOLN: 1000.0000 mg | Freq: Once | INTRAVENOUS | Status: DC

## 2017-08-10 MED ORDER — HEPARIN SODIUM (PORCINE) 5000 UNIT/ML IJ SOLN: 5000.0000 [IU] | Freq: Three times a day (TID) | INTRAMUSCULAR | Status: DC

## 2017-08-10 MED ORDER — SIMVASTATIN 40 MG PO TABS
40.0000 mg | ORAL_TABLET | Freq: Every day | ORAL | Status: DC
  Administered 2017-08-11: 40 mg via ORAL
  Filled 2017-08-10: qty 1

## 2017-08-10 MED ORDER — PIPERACILLIN-TAZOBACTAM 3.375 G IVPB
3.3750 g | Freq: Three times a day (TID) | INTRAVENOUS | Status: DC
  Administered 2017-08-10 – 2017-08-11 (×3): 3.375 g via INTRAVENOUS
  Filled 2017-08-10 (×4): qty 50

## 2017-08-10 MED ORDER — NALOXONE HCL 0.4 MG/ML IJ SOLN
0.4000 mg | Freq: Once | INTRAMUSCULAR | Status: AC
  Administered 2017-08-10: 0.4 mg via INTRAVENOUS

## 2017-08-10 MED ORDER — VANCOMYCIN HCL 10 G IV SOLR
2000.0000 mg | Freq: Once | INTRAVENOUS | Status: AC
  Administered 2017-08-10: 2000 mg via INTRAVENOUS
  Filled 2017-08-10: qty 2000

## 2017-08-10 NOTE — Progress Notes (Signed)
Received report on pt.

## 2017-08-10 NOTE — ED Notes (Signed)
Placed pt on 4L of o2

## 2017-08-10 NOTE — ED Provider Notes (Signed)
[facility] EMERGENCY DEPARTMENT Provider Note   CSN: [id number] Arrival date & time: 08/10/17  1015     History   Chief Complaint Chief Complaint  Patient presents with  . Altered Mental Status    HPI Derek Hays is a 84 y.o. male.  HPI   84 year old male with history of A. fib, hypertension, stroke, here with altered mental status.  History provided by EMS as well as patient's wife due to altered mental status.  Patient reportedly has had a cough for the last several days.  The patient was talkative at his baseline according to wife yesterday.  Throughout the day today, the patient been increasingly drowsy.  He has been minimally responsive.  EMS was subsequently called.  On EMS arrival, patient was moaning but not very responsive.  Blood sugar was normal.  He was noted to have rhonchi.  On arrival here, patient unable to provide history.  Wife does confirm that this is significantly different from his baseline, though he has had progressive decline since his stroke in February.  Of note, patient wife states that he is DO NOT RESUSCITATE.  Past Medical History:  Diagnosis Date  . Atrial fibrillation ([place])   . Bladder cancer ([place]) 1984  . Hypertension   . Stroke [facility])     Patient Active Problem List   Diagnosis Date Noted  . Acute blood loss anemia   . Stage 3 chronic kidney disease ([place])   . PAF (paroxysmal atrial fibrillation) ([place])   . Benign essential HTN   . Dysphagia, post-stroke   . 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 Stroke (cerebrum) ([place]) 05/27/2017    Past Surgical History:  Procedure Laterality Date  . IR CT HEAD LTD  05/27/2017  . IR PERCUTANEOUS ART THROMBECTOMY/INFUSION INTRACRANIAL INC DIAG ANGIO  05/27/2017  . PACEMAKER INSERTION    . RADIOLOGY WITH ANESTHESIA N/A 05/27/2017   Procedure: RADIOLOGY WITH ANESTHESIA;  Surgeon: Luanne Bras, MD;  Location:  [facility];  Service: Radiology;  Laterality: N/A;        Home Medications    Prior to Admission medications   Medication Sig Start Date End Date Taking? Authorizing Provider  apixaban (ELIQUIS) 5 MG TABS tablet Take 1 tablet (5 mg total) by mouth 2 (two) times daily. 06/19/17   Angiulli, Lavon Paganini, PA-C  bethanechol (URECHOLINE) 25 MG tablet Take 1 tablet (25 mg total) by mouth 3 (three) times daily. 06/19/17   Angiulli, Lavon Paganini, PA-C  ciprofloxacin (CIPRO) 250 MG tablet Take 1 tablet (250 mg total) by mouth 2 (two) times daily. 06/19/17   Angiulli, Lavon Paganini, PA-C  Dextran 70-Hypromellose, PF, (ARTIFICIAL TEARS PF) 0.1-0.3 % SOLN Place 1 drop into both eyes every 6 (six) hours as needed (for dry eyes).    [provider]  furosemide (LASIX) 20 MG tablet Take 20 mg by mouth daily as needed for fluid.    [provider]  latanoprost (XALATAN) 0.005 % ophthalmic solution Place 1 drop into both eyes at bedtime.    [provider]  levothyroxine (SYNTHROID, LEVOTHROID) 112 MCG tablet Take 112 mcg by mouth daily before breakfast.    [provider]  pregabalin (LYRICA) 150 MG capsule Take 1 capsule (150 mg total) by mouth 2 (two) times daily. 08/05/17   Venancio Poisson, NP  simvastatin (ZOCOR) 40 MG tablet Take 40 mg by mouth at bedtime.    [provider]  tamsulosin (FLOMAX) 0.4 MG CAPS capsule Take 1 capsule (0.4 mg total) by mouth daily after supper. 06/19/17   Angiulli, Lavon Paganini, PA-C  timolol (TIMOPTIC-XE) 0.5 % ophthalmic gel-forming Place 1 drop into the left eye every morning.    [provider]    Family History Family History  Problem Relation Age of Onset  . Emphysema Father     Social History Social History   Tobacco Use  . Smoking status: Former Smoker    Packs/day: 1.50    Years: 25.00    Pack years: 37.50    Types: Cigarettes    Last attempt to quit: 06/12/1982    Years since quitting: 35.1  . Smokeless tobacco: Never Used    . Tobacco comment: pt states he quit when dx with bladder ca  Substance Use Topics  . Alcohol use: Not Currently  . Drug use: No     Allergies   Patient has no known allergies.   Review of Systems Review of Systems  Unable to perform ROS: Mental status change     Physical Exam Updated Vital Signs BP 113/75   Pulse 65   Temp (!) 97.4 F (36.3 C) (Rectal)   Resp 19   Ht 6' (1.829 m)   Wt 98.9 kg (218 lb)   SpO2 100%   BMI 29.57 kg/m   Physical Exam  Constitutional: He appears well-developed and well-nourished. He appears lethargic. He has a sickly appearance. He appears ill. No distress.  HENT:  Head: Normocephalic and atraumatic.  Mouth/Throat: Oropharynx is clear and moist.  Eyes: Conjunctivae are normal.  Neck: Neck supple.  Cardiovascular: Normal rate, regular rhythm and normal heart sounds. Exam reveals no friction rub.  No murmur heard. Pulmonary/Chest: Effort normal. Tachypnea noted. No respiratory distress. He has decreased breath sounds. He has no wheezes. He has rhonchi in the right middle field and the right lower field. He has no rales.  Abdominal: He exhibits no distension.  Musculoskeletal: He exhibits no edema.  Neurological: He appears lethargic. He is disoriented. He exhibits normal muscle tone. GCS eye subscore is 2. GCS verbal subscore is 2. GCS motor subscore is 5.  Skin: Skin is warm. Capillary refill takes less than 2 seconds.  Psychiatric: He has a normal mood and affect.  Nursing note and vitals reviewed.    ED Treatments / Results  Labs (all labs ordered are listed, but only abnormal results are displayed) Labs Reviewed  COMPREHENSIVE METABOLIC PANEL - Abnormal; Notable for the following components:      Result Value   Glucose, Bld 105 (*)    BUN 24 (*)    Creatinine, Ser 1.53 (*)    Calcium 8.2 (*)    Total Protein 6.0 (*)    Albumin 2.6 (*)    ALT 14 (*)    GFR calc non Af Amer 40 (*)    GFR calc Af Amer 46 (*)    All other  components within normal limits  CBC WITH DIFFERENTIAL/PLATELET - Abnormal; Notable for the following components:   Hemoglobin 12.7 (*)    All other components within normal limits  URINALYSIS, ROUTINE W REFLEX MICROSCOPIC - Abnormal; Notable for the following components:   Color, Urine AMBER (*)    APPearance CLOUDY (*)    Hgb urine dipstick LARGE (*)    Protein, ur 30 (*)    Leukocytes, UA MODERATE (*)    RBC / HPF >50 (*)    WBC, UA >50 (*)  Bacteria, UA FEW (*)    All other components within normal limits  PROTIME-INR - Abnormal; Notable for the following components:   Prothrombin Time 19.8 (*)    All other components within normal limits  I-STAT ARTERIAL BLOOD GAS, ED - Abnormal; Notable for the following components:   pH, Arterial 7.335 (*)    pO2, Arterial 70.0 (*)    All other components within normal limits  CULTURE, BLOOD (ROUTINE X 2)  CULTURE, BLOOD (ROUTINE X 2)  URINE CULTURE  TSH  I-STAT CG4 LACTIC ACID, ED  I-STAT TROPONIN, ED  I-STAT CG4 LACTIC ACID, ED    EKG EKG Interpretation  Date/Time:  Saturday August 10 2017 10:21:28 EDT Ventricular Rate:  65 PR Interval:    QRS Duration: 155 QT Interval:  500 QTC Calculation: 520 R Axis:   -104 Text Interpretation:  Junctional rhythm Right bundle branch block Anterolateral infarct, age indeterminate No significant change since last tracing Confirmed by Duffy Bruce [phone]) on 08/10/2017 10:55:07 AM Also confirmed by Duffy Bruce [phone]), editor Lynder Parents [phone])  on 08/10/2017 12:46:32 PM   Radiology Ct Head Wo Contrast  Result Date: 08/10/2017 CLINICAL DATA:  84 year old male with altered level of consciousness, history of prior right MCA territory infarct. EXAM: CT HEAD WITHOUT CONTRAST TECHNIQUE: Contiguous axial images were obtained from the base of the skull through the vertex without intravenous contrast. COMPARISON:  None. FINDINGS: Brain: Encephalomalacia throughout the right temporal lobe  consistent with prior right MCA territory infarct. Focal encephalomalacia in the left occipital lobe is also present consistent with a remote prior infarct. Remote lacunar infarct in the left subinsular region is also similar compared to prior. No evidence of acute hemorrhage, new acute stroke or mass effect. Small extra-axial dural-based mass lesion overlying the left occipital parietal region remains unchanged and likely represents a small benign meningioma. Cerebral cortical atrophy is again noted as is chronic microvascular ischemic white matter disease. Vascular: No hyperdense vessel or unexpected calcification. Skull: Normal. Negative for fracture or focal lesion. Sinuses/Orbits: No acute finding. Other: None. IMPRESSION: 1. No acute intracranial abnormality. 2. Expected sequelae of prior right MCA and left PCA territory infarcts. 3. Atrophy, chronic left subinsular lacunar infarct and chronic microvascular ischemic white matter disease. Electronically Signed   By: Jacqulynn Cadet M.D.   On: 08/10/2017 13:09   Dg Chest Port 1 View  Result Date: 08/10/2017 CLINICAL DATA:  Altered mental status. EXAM: PORTABLE CHEST 1 VIEW COMPARISON:  05/29/2017 FINDINGS: The pacer wires are stable. The cardiac silhouette, mediastinal and hilar contours are stable. Stable tortuosity and calcification of the thoracic aorta. The lung bases are slightly better aerated. There is a persistent small layering right pleural effusion and overlying atelectasis. No pulmonary edema or pneumothorax. IMPRESSION: Slight improved basilar aeration. Persistent small right effusion and overlying atelectasis. Electronically Signed   By: Marijo Sanes M.D.   On: 08/10/2017 11:28    Procedures .Critical Care Performed by: Duffy Bruce, MD Authorized by: Duffy Bruce, MD   Critical care provider statement:    Critical care time (minutes):  35   Critical care time was exclusive of:  Separately billable procedures and treating  other patients and teaching time   Critical care was necessary to treat or prevent imminent or life-threatening deterioration of the following conditions:  Sepsis, dehydration and CNS failure or compromise   Critical care was time spent personally by me on the following activities:  Development of treatment plan with patient or surrogate, discussions with consultants,  evaluation of patient's response to treatment, examination of patient, obtaining history from patient or surrogate, ordering and performing treatments and interventions, ordering and review of laboratory studies, ordering and review of radiographic studies, pulse oximetry, re-evaluation of patient's condition and review of old charts   I assumed direction of critical care for this patient from another provider in my specialty: no     (including critical care time)  Medications Ordered in ED Medications  vancomycin (VANCOCIN) 1,250 mg in sodium chloride 0.9 % 250 mL IVPB (has no administration in time range)  piperacillin-tazobactam (ZOSYN) IVPB 3.375 g (has no administration in time range)  sodium chloride 0.9 % bolus 1,000 mL (1,000 mLs Intravenous New Bag/Given 08/10/17 1335)  naloxone (NARCAN) injection 0.4 mg (0.4 mg Intravenous Given 08/10/17 1046)  piperacillin-tazobactam (ZOSYN) IVPB 3.375 g (0 g Intravenous Stopped 08/10/17 1129)  sodium chloride 0.9 % bolus 1,000 mL (0 mLs Intravenous Stopped 08/10/17 1257)  vancomycin (VANCOCIN) 2,000 mg in sodium chloride 0.9 % 500 mL IVPB (2,000 mg Intravenous New Bag/Given 08/10/17 1129)  naloxone (NARCAN) 0.4 MG/ML injection ( Intravenous Given 08/10/17 1144)     Initial Impression / Assessment and Plan / ED Course  I have reviewed the triage vital signs and the nursing notes.  Pertinent labs & imaging results that were available during my care of the patient were reviewed by me and considered in my medical decision making (see chart for details).  Clinical Course as of Aug 10 1352    Sat Aug 10, 5564  [id number] 84 year old male here with altered mental status.  On arrival, concern for acute encephalopathy, with concern for sepsis secondary to pneumonia based on his presentation.  Must also consider UTI or other toxic etiology for his mental status change.  Given his blood thinner use and history of stroke, will also obtain a CT.  Patient wife confirms that he is DO NOT RESUSCITATE.   [CI]    Clinical Course User Index [CI] Duffy Bruce, MD    Labs as above. Blood gas reassuring. LA normal.  UA is c/w UTI and CXR c/f aspiration. ABX given. Admit to medicine for encephalopathy 2/2 sepsis. Fluids, IV ABX given.  Final Clinical Impressions(s) / ED Diagnoses   Final diagnoses:  Sepsis due to urinary tract infection [facility])    ED Discharge Orders    None       Duffy Bruce, MD 08/10/17 1354

## 2017-08-10 NOTE — ED Notes (Signed)
Message sent to pharmacy to adjust Zosyn times.

## 2017-08-10 NOTE — Progress Notes (Signed)
Call Pager [phone] for any questions or notifications regarding this patient  FMTS Attending Admission Note: Derek Mcmurray MD Attending pager:[phone] I  have seen and examined this patient, reviewed their chart. I have discussed this patient with the resident. I agree with the resident's findings, assessment and care plan. Please see Dr. Ernestina Hays H&P for complete details (that note is pending). Briefly [name] with decreased level of consciousness noted at his SNF. Was at baseline last evening per wife who conversed with him. Diring day today the staff at SNF noted increasing drowsiness. Sent by EMS to ED.   ROS per wife via EDP: cough over last few weeks.  PERTINENT  PMH / PSH: I have reviewed the patient's medications, allergies, past medical and surgical history, smoking status.  Pertinent findings that relate to this admission from ED include:   1. Hx of prior stroke in February of this year with revascularization of right MCA. MRI after revascularization showed:  IMPRESSION: Acute right MCA infarct involving the right insula and frontal parietal lobe. Negative for hemorrhage. Small acute or subacute infarct left parietal lobe. Subacute infarct left occipital lobe. Findings suggest emboli. Status post revascularization of right middle cerebral artery occlusion. There remains moderate to severe stenosis proximal right M2 segment which may be due to underlying atherosclerotic disease. 2. Atrial fibrillation with report of pacemaker (however patient apparently had MRI in February here at [facility]). We are checking with radiology to see if he has current restriction re MRI and typical pacemaker. Wife says they "did something" to it at last hospitalization in order to get MRI. EXAM:  GEN: he is minimally responsive--arouses and speaks brief sentences to answer very simple questions, speech is somewhat dysarthric.  EXT: Does not move LLE to command.  CV Rhythm seems regular. Slow  at about 60 bpm. No murmur.  RESP: Lungs are CTA with some expiratory snoring. Neck is without LAD, seems supple. ABD is soft and no masses noted.  PELVIS: no tenderness noted on deep palpation. Urine in bag is red in color (traumatic foley placement?)  A/P: decreased responsiveness. Large differential including infectious causes so we are covering with broad spectrum antibiotics. My number one suspicion is tha he has sustained another CVA. We have asked [employer] on call to come and adjust pacer so we can get MRI as I think getting a determination of CVA/no new CVA is necessary to determine appropriate treatment plan.

## 2017-08-10 NOTE — ED Triage Notes (Signed)
Patient presets to the ED for AMS. Per EMS staff at [facility] reports patient has been increasing worst. Patient had history of stroke with left sided neuro deficient. Per EMS patient has right-sided lean and Gauze.  Bp 84/54 patient given 1L NS patient repeat 90/50. RN had to sternal rub patient to have patient respond. Patient opened his eyes after sternal.

## 2017-08-10 NOTE — Progress Notes (Signed)
Pharmacy Antibiotic Note  Derek Hays is a 84 y.o. male admitted on 08/10/2017 with sepsis.  Pharmacy has been consulted for vancomycin and Zosyn dosing.  Afebrile, WBC wnl. SCr 1.53, CrCl ~74ml/min  Plan: Start Zosyn 3.375 gm IV q8h (4 hour infusion) Give vancomycin 2g IV x 1, then start vancomycin 1,250mg  IV Q24h Monitor clinical picture, renal function, VT prn F/U C&S, abx deescalation / LOT    No data recorded.  No results for input(s): WBC, CREATININE, LATICACIDVEN, VANCOTROUGH, VANCOPEAK, VANCORANDOM, GENTTROUGH, GENTPEAK, GENTRANDOM, TOBRATROUGH, TOBRAPEAK, TOBRARND, AMIKACINPEAK, AMIKACINTROU, AMIKACIN in the last 168 hours.  CrCl cannot be calculated (Patient's most recent lab result is older than the maximum 21 days allowed.).    No Known Allergies  Antimicrobials this admission: Zosyn 4/27 >>  Vancomycin 4/27 >>  Dose adjustments this admission: n/a  Microbiology results: 4/27 BCx: sent 4/27 UCx: sent   Thank you for allowing pharmacy to be a part of this patient's care.  Reginia Naas 08/10/2017 10:37 AM

## 2017-08-10 NOTE — H&P (Signed)
[facility] Admission History and Physical Service Pager: [phone]  Patient name: BRITTANY AMIRAULT Medical record number: [id number] Date of birth: 1930-12-21 Age: 84 y.o. Gender: male  Primary Care Provider: Patient, No Pcp Per Consultants: Palliative Code Status: DNR, confirmed on admission  Chief Complaint: AMS  Assessment and Plan: PADRAIG NHAN is a 84 y.o. male presenting with AMS . PMH is significant for recent stroke, paroxysmal afib off eliquis due to ?recent SDH, HTN, hypothyroid,  AMS - Lethargic and minimally responsive on admission, but is following commands. Does not open eyes or speak in more than 1-2 word sentences. Presentation may be consistent with repeat stroke, though CT head was negative for acute abnormality. Need MRI to assess further, will need assistance from [employer]tr[employer] due to patient's pacemaker. Also considering infectious etiologies in urine and lungs; mildly hypotensive at 97.54F, no leukocytosis, other VSS WNL. UA with mod leuks, Hgb/RBCs and +hx of dysuria. History of productive cough x1 day and pleural effusion/atelectasis on CXR, no clear PNA. Less likely iatrogenic cause for AMS upon med review. Blood sugar WNL. No TSH in epic. - admit to [facility] attending Dr. Nori Riis - monitor on telemetry - continue vanc/zosyn started in ED - follow UCx, BCx - monitor respiratory status, mental status, neuro exam - palliative consult for goals of care -  mIVF (s/p 1L bolus in ED) - NPO due to AMS/aspiration risk - check TSH - spoke with radiology dept about obtaining MRI with pacemaker ([employer]tr[employer]). Device will need to be further investigated by [employer] rep. Order placed for MRI.  - order SLP once AMS clears  Hx MCA stroke 05/2017 - Patient was reportedly off eliquis due to a SDH (noted in previous neuro notes, uncertain of timeline for SDH), and then subsequently experienced embolic infarct secondary to afib not on anticoagulation in 05/2017. New  baseline with dysphagia, 0/5 strength in UE and LE, L facial droop. Per neuro notes Eliquis was to be restarted 10 days after CVA, and it is on his active med list. Home meds simvastatin 40 mg QHS. - restart simvastatin once taking PO - holding eliquis until brain imaging complete - heparin for DVT ppx  Hemoglobinuria - noted on admit UA. >50 RBCs, large hgb, appearance amber. May have been traumatic cath. Patient does have hx bladder cancer in 1984 per chart review. Hgb is stable - monitor hgb  CKD III - Creatinine 1.53 on admission, slightly increased from last test at 1.39 one month ago. - likely prerenal in setting of AMS and no PO - mIVF - repeat AM BMP - avoid nephrotoxic agents  Paroxysmal Afib - home medication eliquis. No rate control medication listed. - holding eliquis acutely - heparin for ppx - consider restarting eliquis once MRI brain complete, though no bleed on CT head - monitor HR  HTN  - normotensive on admission 113/75. No home BP medication on his list. - monitor HTN  HFpEF  Mildly hypovolemic on admission. Last echo 2/12 60-65% EF, wall motion normal, G2DD. 20 mg lasix daily PRN hypervolemia.  - giving fluids for possible infection/NPO - monitor volume status  BPH flomax 0.4 mg daily on med list - restart flomax when taking PO  Hypothyroid last TSH not in epic. Home medication levothyroxine 112 mcg qAM - check TSH - continue home medication when taking PO  FEN/GI: NPO, mIVF Prophylaxis: heparin ppx  Disposition:  Admit to telemetry for AMS workup  History of Present Illness:  JARQUEZ MESTRE is  a 84 y.o. male presenting with altered mentation.  Patient was recently hospitalized in February after having an acute stroke, from which she was discharged to skilled nursing facility.  He has residual deficits of left-sided facial, left upper extremity, and left lower extremity weakness, however had been mentating at his prior baseline per his wife up until  yesterday evening.  History is provided by his wife, who reports that yesterday evening the patient seemed very sleepy.  He was noted to have a productive cough and seemed short of breath Subsequently the wife received a call at 8 AM this morning stating that the patient was minimally responsive and being sent to the emergency department.  She reports that he felt feverish 2 days ago, however she is uncertain of his temperature.  Additionally he has been complaining of dysuria for at least 1 month.  She reports that he is status post a course of Macrobid which he had not felt was helping with his dysuria.  At his baseline the patient is coherent, answers questions, and has a good memory.  Review Of Systems: Per HPI. Patient unable to provide further ROS due to AMS. ROS  Patient Active Problem List   Diagnosis Date Noted  . Acute blood loss anemia   . Stage 3 chronic kidney disease ([place])   . PAF (paroxysmal atrial fibrillation) ([place])   . Benign essential HTN   . Dysphagia, post-stroke   . 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 Stroke (cerebrum) ([place]) 05/27/2017    Past Medical History: Past Medical History:  Diagnosis Date  . Atrial fibrillation ([place])   . Bladder cancer ([place]) 1984  . Hypertension   . Stroke [facility])     Past Surgical History: Past Surgical History:  Procedure Laterality Date  . IR CT HEAD LTD  05/27/2017  . IR PERCUTANEOUS ART THROMBECTOMY/INFUSION INTRACRANIAL INC DIAG ANGIO  05/27/2017  . PACEMAKER INSERTION    . RADIOLOGY WITH ANESTHESIA N/A 05/27/2017   Procedure: RADIOLOGY WITH ANESTHESIA;  Surgeon: Luanne Bras, MD;  Location: [facility];  Service: Radiology;  Laterality: N/A;    Social History: Social History   Tobacco Use  . Smoking status: Former Smoker    Packs/day: 1.50    Years: 25.00    Pack years: 37.50    Types: Cigarettes    Last attempt to quit: 06/12/1982    Years since  quitting: 35.1  . Smokeless tobacco: Never Used  . Tobacco comment: pt states he quit when dx with bladder ca  Substance Use Topics  . Alcohol use: Not Currently  . Drug use: No    Please also refer to relevant sections of EMR.  Family History: Family History  Problem Relation Age of Onset  . Emphysema Father     Allergies and Medications: No Known Allergies No current facility-administered medications on file prior to encounter.    Current Outpatient Medications on File Prior to Encounter  Medication Sig Dispense Refill  . apixaban (ELIQUIS) 5 MG TABS tablet Take 1 tablet (5 mg total) by mouth 2 (two) times daily. 60 tablet   . bethanechol (URECHOLINE) 25 MG tablet Take 1 tablet (25 mg total) by mouth 3 (three) times daily.    . ciprofloxacin (CIPRO) 250 MG tablet Take 1 tablet (250 mg total) by mouth 2 (two) times daily.    [name] Dextran 70-Hypromellose, PF, (ARTIFICIAL TEARS PF) 0.1-0.3 % SOLN Place 1 drop into both eyes every  6 (six) hours as needed (for dry eyes).    . furosemide (LASIX) 20 MG tablet Take 20 mg by mouth daily as needed for fluid.    [name] latanoprost (XALATAN) 0.005 % ophthalmic solution Place 1 drop into both eyes at bedtime.    [name] levothyroxine (SYNTHROID, LEVOTHROID) 112 MCG tablet Take 112 mcg by mouth daily before breakfast.    . pregabalin (LYRICA) 150 MG capsule Take 1 capsule (150 mg total) by mouth 2 (two) times daily. 90 capsule 3  . simvastatin (ZOCOR) 40 MG tablet Take 40 mg by mouth at bedtime.    . tamsulosin (FLOMAX) 0.4 MG CAPS capsule Take 1 capsule (0.4 mg total) by mouth daily after supper. 30 capsule   . timolol (TIMOPTIC-XE) 0.5 % ophthalmic gel-forming Place 1 drop into the left eye every morning.      Objective: BP 113/75   Pulse 65   Temp (!) 97.4 F (36.3 C) (Rectal)   Resp 19   Ht 6' (1.829 m)   Wt 218 lb (98.9 kg)   SpO2 100%   BMI 29.57 kg/m  Exam: General: Lethargic, elderly chronically ill-appearing male resting in bed with  left facial droop Eyes: PERRL, no scleral icterus ENTM: Mucous membranes dry Neck: no LAD Cardiovascular: RRR, no m/r/g Respiratory: +transmitted upper airway sounds, coarse breath sounds throughout all lung fields represent transmitted upper airway sounds, no wheezes Gastrointestinal: soft, nondistended, nontender MSK: +left UE edema with strong distal pulses, no gross deformities Derm: no appreciated breakdown or lesion Neuro: limited exam due to AMS. Opens eyes slightly to command but not completely. PERRL. Unable to assess extraocular movements. +profound L facial droop. Grip strength on right intact. Does not move LUE or LLE. Nods to indicate LUE and LLE sensation are intact. Psych: responds to commands  Labs and Imaging: CBC BMET  Recent Labs  Lab 08/10/17 1104  WBC 7.1  HGB 12.7*  HCT 39.3  PLT 179   Recent Labs  Lab 08/10/17 1104  NA 139  K 3.9  CL 104  CO2 27  BUN 24*  CREATININE 1.53*  GLUCOSE 105*  CALCIUM 8.2*     Ct Head Wo Contrast 08/10/2017 IMPRESSION:  1. No acute intracranial abnormality.  2. Expected sequelae of prior right MCA and left PCA territory infarcts.  3. Atrophy, chronic left subinsular lacunar infarct and chronic microvascular ischemic white matter disease.   Dg Chest Port 1 View 08/10/2017 IMPRESSION: Slight improved basilar aeration. Persistent small right effusion and overlying atelectasis.    Everrett Coombe, MD 08/10/2017, 1:36 PM PGY-2, [place] Intern pager: [phone], text pages welcome

## 2017-08-10 NOTE — Progress Notes (Signed)
Pt transferred from [facility] to 5W08. Pt A&O x4. Pt made comfortable in bed and given Call bell. Will continue to assess.

## 2017-08-11 ENCOUNTER — Inpatient Hospital Stay (HOSPITAL_COMMUNITY): Payer: Medicare Other

## 2017-08-11 DIAGNOSIS — Z515 Encounter for palliative care: Secondary | ICD-10-CM

## 2017-08-11 DIAGNOSIS — R4182 Altered mental status, unspecified: Secondary | ICD-10-CM

## 2017-08-11 DIAGNOSIS — A419 Sepsis, unspecified organism: Secondary | ICD-10-CM

## 2017-08-11 DIAGNOSIS — N390 Urinary tract infection, site not specified: Principal | ICD-10-CM

## 2017-08-11 DIAGNOSIS — R0602 Shortness of breath: Secondary | ICD-10-CM

## 2017-08-11 LAB — BASIC METABOLIC PANEL
ANION GAP: 13 (ref 5–15)
BUN: 22 mg/dL — ABNORMAL HIGH (ref 6–20)
CHLORIDE: 107 mmol/L (ref 101–111)
CO2: 22 mmol/L (ref 22–32)
Calcium: 7.5 mg/dL — ABNORMAL LOW (ref 8.9–10.3)
Creatinine, Ser: 1.34 mg/dL — ABNORMAL HIGH (ref 0.61–1.24)
GFR calc non Af Amer: 47 mL/min — ABNORMAL LOW (ref >60)
GFR, EST AFRICAN AMERICAN: 54 mL/min — AB (ref >60)
Glucose, Bld: 73 mg/dL (ref 65–99)
POTASSIUM: 3.6 mmol/L (ref 3.5–5.1)
Sodium: 142 mmol/L (ref 135–145)

## 2017-08-11 LAB — CBC
HEMATOCRIT: 34.8 % — AB (ref 39.0–52.0)
HEMOGLOBIN: 11.3 g/dL — AB (ref 13.0–17.0)
MCH: 28.5 pg (ref 26.0–34.0)
MCHC: 32.5 g/dL (ref 30.0–36.0)
MCV: 87.9 fL (ref 78.0–100.0)
Platelets: 167 10*3/uL (ref 150–400)
RBC: 3.96 MIL/uL — ABNORMAL LOW (ref 4.22–5.81)
RDW: 14.3 % (ref 11.5–15.5)
WBC: 7 10*3/uL (ref 4.0–10.5)

## 2017-08-11 LAB — MRSA PCR SCREENING: MRSA BY PCR: NEGATIVE

## 2017-08-11 MED ORDER — IPRATROPIUM-ALBUTEROL 0.5-2.5 (3) MG/3ML IN SOLN
3.0000 mL | Freq: Four times a day (QID) | RESPIRATORY_TRACT | Status: DC | PRN
  Administered 2017-08-12: 3 mL via RESPIRATORY_TRACT
  Filled 2017-08-11: qty 3

## 2017-08-11 MED ORDER — LEVOFLOXACIN 500 MG PO TABS
500.0000 mg | ORAL_TABLET | Freq: Every day | ORAL | Status: DC
  Administered 2017-08-11 – 2017-08-13 (×3): 500 mg via ORAL
  Filled 2017-08-11 (×3): qty 1

## 2017-08-11 MED ORDER — IPRATROPIUM-ALBUTEROL 0.5-2.5 (3) MG/3ML IN SOLN
3.0000 mL | Freq: Once | RESPIRATORY_TRACT | Status: AC
  Administered 2017-08-11: 3 mL via RESPIRATORY_TRACT
  Filled 2017-08-11: qty 3

## 2017-08-11 MED ORDER — APIXABAN 2.5 MG PO TABS
2.5000 mg | ORAL_TABLET | Freq: Two times a day (BID) | ORAL | Status: DC
  Administered 2017-08-11 – 2017-08-12 (×3): 2.5 mg via ORAL
  Filled 2017-08-11 (×3): qty 1

## 2017-08-11 MED ORDER — ORAL CARE MOUTH RINSE
15.0000 mL | Freq: Two times a day (BID) | OROMUCOSAL | Status: DC
  Administered 2017-08-11 – 2017-08-13 (×5): 15 mL via OROMUCOSAL

## 2017-08-11 NOTE — Plan of Care (Signed)
  Problem: Education: Goal: Knowledge of General Education information will improve Outcome: Progressing   Problem: Health Behavior/Discharge Planning: Goal: Ability to manage health-related needs will improve Outcome: Progressing   Problem: Clinical Measurements: Goal: Respiratory complications will improve Outcome: Progressing   Problem: Safety: Goal: Ability to remain free from injury will improve Outcome: Progressing   Problem: Skin Integrity: Goal: Risk for impaired skin integrity will decrease Outcome: Progressing

## 2017-08-11 NOTE — Progress Notes (Addendum)
Family Medicine Teaching Service Daily Progress Note Intern Pager: [phone]  Patient name: Derek Hays Medical record number: [id number] Date of birth: 12/24/1930 Age: 84 y.o. Gender: male  Primary Care Provider: Patient, No Pcp Per Consultants: Palliative Code Status: DNR  Pt Overview and Major Events to Date:  4/27: Admitted to FMTS with acute encephalopathy  Assessment and Plan: THURL BOEN is a 84 y.o. male presenting with AMS . PMH is significant for recent stroke, paroxysmal afib off eliquis due to ?recent SDH, HTN, hypothyroid,  AMS likely 2/2 to HAP/UTI - Patient with shortness of breath and productive cough, also with new O2 requirement of 2L after desaturations to the 80s. On exam, he does have diffuse expiratory wheezing and rhonchi. Also endorsing dysuria for the last couple of weeks s/p treatment with Macrobid. Stroke ruled out with negative MRI. Alert and oriented x 4 this morning. - monitor on telemetry - Has Vanc/Zosyn for today, plan to switch to levaquin today for coverage of HAP and UTI - repeat 2 view CXR - give duoneb now, then q6hrs prn - consider prednisone - will consider giving a dose of IV Lasix today, pending CXR results - wean O2 as able - blood and urine cultures pending - monitor respiratory status- currently on 2L - palliative consult for goals of care - stop MIVFs and encourage good PO intake - SLP consulted  Hx MCA stroke 05/2017 - Patient was reportedly off eliquis due to a SDH (noted in previous neuro notes, uncertain of timeline for SDH), and then subsequently experienced embolic infarct secondary to afib not on anticoagulation in 05/2017. New baseline with dysphagia, 0/5 strength in UE and LE, L facial droop. Per neuro notes Eliquis was to be restarted 10 days after CVA, and it is on his active med list. Home meds simvastatin 40 mg QHS. - restart simvastatin - stop heparin prophylaxis and restart eliquis 2.5mg  bid  Hemoglobinuria - noted  on admit UA. >50 RBCs, large hgb, appearance amber. May have been traumatic cath. Patient does have hx bladder cancer in 1984 per chart review. Hgb is stable. - monitor hgb - repeat UA as an outpatient  CKD III - Creatinine 1.53 on admission, slightly increased from 1.39 last month. Likely prerenal in the setting of decreased PO. - stop MIVFs today and encourage PO - trend BMP - avoid nephrotoxic agents  Paroxysmal Afib - home medication eliquis. No rate control medication listed. - stop heparin prophylaxis and restart eliquis at 2.5mg  bid - monitor HR  HTN - BPs fluctuating between normotensive and hypertensive, but BP 139/76 this morning. Not on any BP meds at home. - monitor HTN  HFpEF  Mildly hypovolemic on admission. Last echo 2/12 60-65% EF, wall motion normal, G2DD. 20 mg lasix daily PRN hypervolemia.  - stopping IVFs - will consider dose of Lasix today, pending CXR results - monitor volume status  BPH- flomax 0.4 mg daily on med list - restart flomax  Hypothyroidism - TSH normal this admission. - continue home synthroid 158mcg daily  FEN/GI: DYS II per speech Prophylaxis: eliquis  Disposition:  back to SNF likely 4/29, pending ability to be weaned from O2  Subjective:  Patient states he is doing fine this morning. He says he was never confused at the SNF, and they were just worried because he wasn't waking up. No complaints today.  Objective: Temp:  [97.3 F (36.3 C)-98.3 F (36.8 C)] 97.5 F (36.4 C) (04/28 0535) Pulse Rate:  [58-65] 59 (04/28 0535)  Resp:  [13-28] 20 (04/28 0535) BP: (107-174)/(56-94) 139/76 (04/28 0535) SpO2:  [81 %-100 %] 100 % (04/28 0535) Weight:  [218 lb (98.9 kg)] 218 lb (98.9 kg) (04/27 1038) Physical Exam: General: Sitting up in bed, alert, in NAD, talkative HEENT: MMM Cardiovascular: RRR, no murmurs Respiratory: expiratory wheezing and rhonchi throughout all lung fields. Gastrointestinal: soft, nondistended, nontender MSK:  left UE edema present, no LE edema Derm: mild hyperpigmented skin changes to the lower extremities bilaterally Neuro: left facial droop present, 1/5 muscle strength in the LUE and LLE, 5/5 muscle strength on the right.  Laboratory: Recent Labs  Lab 08/10/17 1104  WBC 7.1  HGB 12.7*  HCT 39.3  PLT 179   Recent Labs  Lab 08/10/17 1104  NA 139  K 3.9  CL 104  CO2 27  BUN 24*  CREATININE 1.53*  CALCIUM 8.2*  PROT 6.0*  BILITOT 0.8  ALKPHOS 74  ALT 14*  AST 17  GLUCOSE 105*    Imaging/Diagnostic Tests: -CT head- prior right MCA and left PCA territory infarcts; chronic left lacunar infarct and chronic microvascular ischemic disease -MRI brain- no acute finding; large remote right MCA infarct. Moderate remote left occipital infarct.  Makynlee Kressin, Pete Pelt, MD 08/11/2017, 8:35 AM PGY-3, [place] Intern pager: [phone], text pages welcome

## 2017-08-11 NOTE — Discharge Summary (Signed)
Entered in error

## 2017-08-11 NOTE — Progress Notes (Signed)
PHARMACY NOTE:  ANTIMICROBIAL RENAL DOSAGE ADJUSTMENT  Current antimicrobial regimen includes a mismatch between antimicrobial dosage and estimated renal function.  As per policy approved by the Pharmacy & Therapeutics and Medical Executive Committees, the antimicrobial dosage will be adjusted accordingly.  Current antimicrobial dosage:  Levofloxacin 750mg  PO daily  Indication: HAP/UTI  Renal Function:  Estimated Creatinine Clearance: 50 mL/min (A) (by C-G formula based on SCr of 1.34 mg/dL (H)). []      On intermittent HD, scheduled: []      On CRRT    Antimicrobial dosage has been changed to:  Levofloxacin 500mg  PO daily d/t age and borderline renal function  Additional comments:   Thank you for allowing pharmacy to be a part of this patient's care.  Verna Czech, [facility] 08/11/2017 11:59 AM

## 2017-08-11 NOTE — Progress Notes (Signed)
Patient asked about eating and drinking, paged team, per MD wants to wait until patient has swallow study due to aspiration risk. Patient educated and given mouth swab to use for dry mouth.

## 2017-08-11 NOTE — Evaluation (Signed)
Clinical/Bedside Swallow Evaluation Patient Details  Name: Derek Hays MRN: [id number] Date of Birth: 1931-03-16  Today's Date: 08/11/2017 Time: SLP Start Time (ACUTE ONLY): 0844 SLP Stop Time (ACUTE ONLY): 0908 SLP Time Calculation (min) (ACUTE ONLY): 24 min  Past Medical History:  Past Medical History:  Diagnosis Date  . Atrial fibrillation ([place])   . Bladder cancer ([place]) 1984  . Hypertension   . Stroke [facility])    Past Surgical History:  Past Surgical History:  Procedure Laterality Date  . IR CT HEAD LTD  05/27/2017  . IR PERCUTANEOUS ART THROMBECTOMY/INFUSION INTRACRANIAL INC DIAG ANGIO  05/27/2017  . PACEMAKER INSERTION    . RADIOLOGY WITH ANESTHESIA N/A 05/27/2017   Procedure: RADIOLOGY WITH ANESTHESIA;  Surgeon: Luanne Bras, MD;  Location: [facility];  Service: Radiology;  Laterality: N/A;   HPI:  Pt is an 84 y.o. male presenting with AMS. MRI negative for acute findings. CXR without evidence of PNA. Pt was seen by SLP following recent CVA in February 2019, with two MBS recommending Dys 1 diet and nectar thick liquids due to silent aspiration/penetration of thin liquids. Pt was clinically advanced to thin liquids prior to discharge. PMH is significant for recent stroke, paroxysmal afib off eliquis due to ?recent SDH, HTN, hypothyroid   Assessment / Plan / Recommendation Clinical Impression  Pt's oropharyngeal swallow appears to be similar to level of function described during recent admission s/p CVA. He had a subtle, wet vocal quality at baseline that was noted one additional time during PO intake. SLP provided Min cues for volitional coughing to clear. He has no overt signs of aspiration, although sensation was felt to be impaired on previous MBS. Discussed with MD - pt had been on a soft diet and thin liquids since the beginning of March, and CXR is not concerning for acute infection. Given that he appears to have been tolerating baseline diet, MD in agreement to restart diet  with careful monitoring to determine if further assessment with MBS is indicated.  SLP Visit Diagnosis: Dysphagia, oropharyngeal phase (R13.12)    Aspiration Risk  Moderate aspiration risk;Mild aspiration risk    Diet Recommendation Dysphagia 2 (Fine chop);Thin liquid   Liquid Administration via: Cup;No straw Medication Administration: Whole meds with puree Supervision: Staff to assist with self feeding;Full supervision/cueing for compensatory strategies Compensations: Slow rate;Small sips/bites;Minimize environmental distractions Postural Changes: Seated upright at 90 degrees;Remain upright for at least 30 minutes after po intake    Other  Recommendations Oral Care Recommendations: Oral care BID   Follow up Recommendations Skilled Nursing facility      Frequency and Duration min 2x/week  2 weeks       Prognosis Prognosis for Safe Diet Advancement: Good      Swallow Study   General HPI: Pt is an 84 y.o. male presenting with AMS. MRI negative for acute findings. CXR without evidence of PNA. Pt was seen by SLP following recent CVA in February 2019, with two MBS recommending Dys 1 diet and nectar thick liquids due to silent aspiration/penetration of thin liquids. Pt was clinically advanced to thin liquids prior to discharge. PMH is significant for recent stroke, paroxysmal afib off eliquis due to ?recent SDH, HTN, hypothyroid Type of Study: Bedside Swallow Evaluation Previous Swallow Assessment: see HPI Diet Prior to this Study: NPO Temperature Spikes Noted: No Respiratory Status: Nasal cannula History of Recent Intubation: No Behavior/Cognition: Alert;Cooperative Oral Cavity Assessment: Dry Oral Care Completed by SLP: No Oral Cavity - Dentition: Missing dentition Vision:  Functional for self-feeding Self-Feeding Abilities: Needs assist Patient Positioning: Upright in bed Baseline Vocal Quality: Wet(intermittently) Volitional Cough: Weak Volitional Swallow: Able to elicit     Oral/Motor/Sensory Function Overall Oral Motor/Sensory Function: Moderate impairment(mild-mod) Facial ROM: Reduced left;Suspected CN VII (facial) dysfunction Facial Symmetry: Abnormal symmetry left;Suspected CN VII (facial) dysfunction Facial Strength: Reduced left;Suspected CN VII (facial) dysfunction Lingual ROM: Within Functional Limits Lingual Symmetry: Within Functional Limits Lingual Strength: Reduced Velum: (difficult to see, appears deviated) Mandible: Within Functional Limits   Ice Chips Ice chips: Within functional limits Presentation: Spoon   Thin Liquid Thin Liquid: Impaired Presentation: Cup;Self Fed Pharyngeal  Phase Impairments: Throat Clearing - Delayed;Wet Vocal Quality    Nectar Thick Nectar Thick Liquid: Not tested   Honey Thick Honey Thick Liquid: Not tested   Puree Puree: Within functional limits Presentation: Spoon   Solid   GO   Solid: Impaired Presentation: Self Fed Oral Phase Impairments: Impaired mastication;Other (comment)(pt with c/o food being too dry)        [name] 08/11/2017,9:30 AM   [name], M.A. CCC-SLP [phone]

## 2017-08-11 NOTE — Consult Note (Signed)
Consultation Note Date: 08/11/17  Patient Name: Derek Hays  DOB: Jun 24, 1930  MRN: [id number]  Age / Sex: 84 y.o., male  PCP: Patient, No Pcp Per Referring Physician: Dickie La, MD  Reason for Consultation: Establishing goals of care  HPI/Patient Profile: 84 y.o. male  with past medical history of CVA in February 2019, paroxysmal afib off eliquis due to ? Recent SDH, HTN, hypothyroidism, bladder cancer, and pacemaker admitted on 08/10/2017 with altered mental status. CT head and MRI negative for acute findings. Received IV vancomycin and zosyn for possible pneumonia or UTI. Switched to PO Levaquin 4/28. Baseline, patient with left-sided paralysis, left facial droop, and dysphagia. SLP following and recommending dysphagia 2 diet fine chopped with thin liquids. Palliative medicine consultation for goals of care.   Clinical Assessment and Goals of Care: I have reviewed medical records and met with patient at bedside. No family present. Derek Hays is awake, alert, and oriented. Receiving a breathing treatment.   I updated him on hospital diagnoses and interventions. He is relieved to hear he did not have another stroke. He understands he will be treated empirically with an antibiotic for possible UTI and pneumonia.    Derek Hays spends time sharing a life review including wife of 46 years and two adult children. He states "we were getting along ok until the stroke." He speaks of the challenges of losing independence since his stroke in February. He worries about his wife living home alone and the burdens that come with managing a house. He wishes he could return home but also understands he requires assistance since stroke, which left him with left paralysis and difficulty swallowing. His daughter lives in [place] but will be visiting next week.   Derek Hays tells me his wife visits most days. He encourages  me to come back tomorrow afternoon but that he may be discharged.   Questions and concerns were addressed. Therapeutic listening and emotional support provided.    SUMMARY OF RECOMMENDATIONS    DNR. Continue medical management.   No family at bedside during my visit.   Will attempt follow-up tomorrow if wife present. If patient discharges, may benefit from outpatient palliative referral for ongoing supportive care.   Code Status/Advance Care Planning:  DNR  Symptom Management:   Per attending  Palliative Prophylaxis:   Aspiration, Delirium Protocol and Oral Care  Psycho-social/Spiritual:   Desire for further Chaplaincy support: yes  Additional Recommendations: Caregiving  Support/Resources  Prognosis:   Unable to determine  Discharge Planning: To Be Determined  Likely back to SNF     Primary Diagnoses: Present on Admission: . Altered mental status   I have reviewed the medical record, interviewed the patient and family, and examined the patient. The following aspects are pertinent.  Past Medical History:  Diagnosis Date  . Atrial fibrillation ([place])   . Bladder cancer ([place]) 1984  . Hypertension   . Stroke [facility])    Social History   Socioeconomic History  . Marital status: Married  Spouse name: Not on file  . Number of children: Not on file  . Years of education: Not on file  . Highest education level: Not on file  Occupational History    Employer: [employer]  Social Needs  . Financial resource strain: Not on file  . Food insecurity:    Worry: Not on file    Inability: Not on file  . Transportation needs:    Medical: Not on file    Non-medical: Not on file  Tobacco Use  . Smoking status: Former Smoker    Packs/day: 1.50    Years: 25.00    Pack years: 37.50    Types: Cigarettes    Last attempt to quit: 06/12/1982    Years since quitting: 35.1  . Smokeless tobacco: Never Used  . Tobacco comment: pt states he quit when dx with bladder ca    Substance and Sexual Activity  . Alcohol use: Not Currently  . Drug use: No  . Sexual activity: Not on file  Lifestyle  . Physical activity:    Days per week: Not on file    Minutes per session: Not on file  . Stress: To some extent  Relationships  . Social connections:    Talks on phone: More than three times a week    Gets together: Twice a week    Attends religious service: Not on file    Active member of club or organization: Not on file    Attends meetings of clubs or organizations: Never    Relationship status: Not on file  Other Topics Concern  . Not on file  Social History Narrative  . Not on file   Family History  Problem Relation Age of Onset  . Emphysema Father    Scheduled Meds: . apixaban  2.5 mg Oral BID  . levofloxacin  500 mg Oral Daily  . levothyroxine  112 mcg Oral QAC breakfast  . mouth rinse  15 mL Mouth Rinse BID  . simvastatin  40 mg Oral QHS  . tamsulosin  0.4 mg Oral QPC supper  . timolol  1 drop Left Eye q morning - 10a   Continuous Infusions: . sodium chloride 140 mL/hr at 08/11/17 0541   PRN Meds:.ipratropium-albuterol Medications Prior to Admission:  Prior to Admission medications   Medication Sig Start Date End Date Taking? Authorizing Provider  acetaminophen (TYLENOL) 500 MG tablet Take 500-1,000 mg by mouth every 4 (four) hours as needed for headache (pain).   Yes [provider]  apixaban (ELIQUIS) 5 MG TABS tablet Take 1 tablet (5 mg total) by mouth 2 (two) times daily. 06/19/17  Yes Angiulli, Lavon Paganini, PA-C  bethanechol (URECHOLINE) 25 MG tablet Take 1 tablet (25 mg total) by mouth 3 (three) times daily. Patient taking differently: Take 25 mg by mouth 3 (three) times daily. 7:30am, 5pm, 9pm 06/19/17  Yes Angiulli, Lavon Paganini, PA-C  carboxymethylcellulose (REFRESH TEARS) 0.5 % SOLN Place 1 drop into both eyes every 6 (six) hours as needed (dry eyes).   Yes [provider]  Dextromethorphan-guaiFENesin (GERI-TUSSIN DM)  10-100 MG/5ML liquid Take 15 mLs by mouth every 4 (four) hours as needed (cough/congestion).   Yes [provider]  furosemide (LASIX) 20 MG tablet Take 20 mg by mouth daily as needed for fluid or edema.    Yes [provider]  latanoprost (XALATAN) 0.005 % ophthalmic solution Place 1 drop into both eyes at bedtime.   Yes [provider]  levothyroxine (SYNTHROID, [place])  112 MCG tablet Take 112 mcg by mouth daily.    Yes [provider]  pregabalin (LYRICA) 150 MG capsule Take 1 capsule (150 mg total) by mouth 2 (two) times daily. 08/05/17  Yes Vanschaick, Janett Billow, NP  senna-docusate (SENNA-PLUS) 8.6-50 MG tablet Take 2 tablets by mouth 2 (two) times daily.   Yes [provider]  simvastatin (ZOCOR) 40 MG tablet Take 40 mg by mouth at bedtime.   Yes [provider]  tamsulosin (FLOMAX) 0.4 MG CAPS capsule Take 1 capsule (0.4 mg total) by mouth daily after supper. Patient taking differently: Take 0.4 mg by mouth at bedtime.  06/19/17  Yes Angiulli, Lavon Paganini, PA-C  timolol (TIMOPTIC) 0.5 % ophthalmic solution Place 1 drop into the left eye daily.   Yes [provider]   No Known Allergies Review of Systems  Constitutional: Positive for activity change and appetite change.  Respiratory: Positive for cough.   Neurological: Positive for weakness.   Physical Exam  Constitutional: He is oriented to person, place, and time. He is cooperative.  HENT:  Head: Normocephalic and atraumatic.  Pulmonary/Chest: No accessory muscle usage. No tachypnea. No respiratory distress.  2L [place]  Neurological: He is alert and oriented to person, place, and time.  Skin: Skin is warm and dry. There is pallor.  Psychiatric: He has a normal mood and affect. His speech is normal and behavior is normal. Cognition and memory are normal.  Nursing note and vitals reviewed.  Vital Signs: BP 139/76 (BP Location: Right Arm)   Pulse (!) 59   Temp (!) 97.5 F (36.4  C)   Resp 20   Ht 6' (1.829 m)   Wt 98.9 kg (218 lb)   SpO2 100%   BMI 29.57 kg/m  Pain Scale: 0-10   Pain Score: 0-No pain  SpO2: SpO2: 100 % O2 Device:SpO2: 100 % O2 Flow Rate: .O2 Flow Rate (L/min): 2 L/min  IO: Intake/output summary:   Intake/Output Summary (Last 24 hours) at 08/11/2017 1319 Last data filed at 08/11/2017 1121 Gross per 24 hour  Intake 1651 ml  Output 860 ml  Net 791 ml    LBM: Last BM Date: 08/10/17 Baseline Weight: Weight: 98.9 kg (218 lb) Most recent weight: Weight: 98.9 kg (218 lb)     Palliative Assessment/Data: PPS 40%     Time In: 1245 Time Out: 1320 Time Total: 84mn Greater than 50%  of this time was spent counseling and coordinating care related to the above assessment and plan.  Signed by:  MIhor Dow FNP-C Palliative Medicine Team  Phone: [phone]: [phone]  Please contact Palliative Medicine Team phone at [phone] questions and concerns.  For individual provider: See AShea Evans

## 2017-08-12 DIAGNOSIS — N390 Urinary tract infection, site not specified: Secondary | ICD-10-CM

## 2017-08-12 DIAGNOSIS — Z515 Encounter for palliative care: Secondary | ICD-10-CM

## 2017-08-12 DIAGNOSIS — A419 Sepsis, unspecified organism: Secondary | ICD-10-CM

## 2017-08-12 DIAGNOSIS — J189 Pneumonia, unspecified organism: Secondary | ICD-10-CM

## 2017-08-12 LAB — BASIC METABOLIC PANEL
Anion gap: 7 (ref 5–15)
BUN: 18 mg/dL (ref 6–20)
CO2: 23 mmol/L (ref 22–32)
Calcium: 7.9 mg/dL — ABNORMAL LOW (ref 8.9–10.3)
Chloride: 110 mmol/L (ref 101–111)
Creatinine, Ser: 1.27 mg/dL — ABNORMAL HIGH (ref 0.61–1.24)
GFR calc Af Amer: 58 mL/min — ABNORMAL LOW (ref >60)
GFR calc non Af Amer: 50 mL/min — ABNORMAL LOW (ref >60)
Glucose, Bld: 76 mg/dL (ref 65–99)
Potassium: 3.6 mmol/L (ref 3.5–5.1)
Sodium: 140 mmol/L (ref 135–145)

## 2017-08-12 LAB — CBC
HEMATOCRIT: 34.8 % — AB (ref 39.0–52.0)
Hemoglobin: 11.2 g/dL — ABNORMAL LOW (ref 13.0–17.0)
MCH: 27.9 pg (ref 26.0–34.0)
MCHC: 32.2 g/dL (ref 30.0–36.0)
MCV: 86.8 fL (ref 78.0–100.0)
PLATELETS: 183 10*3/uL (ref 150–400)
RBC: 4.01 MIL/uL — ABNORMAL LOW (ref 4.22–5.81)
RDW: 14.1 % (ref 11.5–15.5)
WBC: 6.6 10*3/uL (ref 4.0–10.5)

## 2017-08-12 LAB — URINE CULTURE

## 2017-08-12 MED ORDER — APIXABAN 5 MG PO TABS
5.0000 mg | ORAL_TABLET | Freq: Two times a day (BID) | ORAL | Status: DC
  Administered 2017-08-12 – 2017-08-13 (×2): 5 mg via ORAL
  Filled 2017-08-12 (×2): qty 1

## 2017-08-12 MED ORDER — HYDRALAZINE HCL 20 MG/ML IJ SOLN
5.0000 mg | INTRAMUSCULAR | Status: DC | PRN
  Administered 2017-08-12 – 2017-08-13 (×5): 5 mg via INTRAVENOUS
  Filled 2017-08-12 (×5): qty 1

## 2017-08-12 MED ORDER — ATORVASTATIN CALCIUM 40 MG PO TABS
40.0000 mg | ORAL_TABLET | Freq: Every day | ORAL | Status: DC
  Administered 2017-08-12 – 2017-08-13 (×2): 40 mg via ORAL
  Filled 2017-08-12 (×2): qty 1

## 2017-08-12 MED ORDER — IPRATROPIUM-ALBUTEROL 0.5-2.5 (3) MG/3ML IN SOLN
3.0000 mL | RESPIRATORY_TRACT | Status: DC
  Administered 2017-08-12 – 2017-08-13 (×4): 3 mL via RESPIRATORY_TRACT
  Filled 2017-08-12 (×4): qty 3

## 2017-08-12 MED ORDER — AMLODIPINE BESYLATE 5 MG PO TABS
5.0000 mg | ORAL_TABLET | Freq: Every day | ORAL | Status: DC
  Administered 2017-08-12 – 2017-08-13 (×2): 5 mg via ORAL
  Filled 2017-08-12 (×2): qty 1

## 2017-08-12 NOTE — Progress Notes (Signed)
Family Medicine Teaching Service Daily Progress Note Intern Pager: [phone]  Patient name: Derek Hays Medical record number: [id number] Date of birth: [birth year] Age: 84 y.o. Gender: male  Primary Care Provider: Patient, No Pcp Per Consultants: Palliative Code Status: DNR  Pt Overview and Major Events to Date:  4/27: Admitted to FMTS with acute encephalopathy  Assessment and Plan: KRISTOPHER ATTWOOD is a 84 y.o. male presenting with AMS . PMH is significant for recent stroke, paroxysmal afib off eliquis due to ?recent SDH, HTN, hypothyroid,  AMS (resolved) likely 2/2 to HAP/UTI - AMS continues to be resolved. Diffuse wheezing on exam. Afebrile with no leukocytosis but continues to require supplemental O2 to maintain sats. Repeat CXR yesterday did not show significant change from prior. Will recheck later after duoneb.  - monitor on telemetry - s/p Vanc/Zosyn  - continue levaquin (4/28 - ) - sched duoneb - consider prednisone - wean O2 as able - 2L  - blood and urine cultures pending - blood cx NGx24hrs, urine cx 40k E. faecalis - palliative consult for goals of care - pending - SLP consulted - Dys 2  Hx MCA stroke 05/2017 - At baseline neurologically. - restart simvastatin - restart Eliquis 5mg  BID  Hemoglobinuria - noted on admit UA. >50 RBCs, large hgb, appearance amber. May have been traumatic cath. Patient does have hx bladder cancer in 1984 per chart review. Hgb is stable. - monitor hgb - repeat UA as an outpatient  CKD III - Creatinine 1.53>1.27 (1.39 last month). Likely prerenal in the setting of decreased PO. - stop IVF, encourage PO - trend BMP - avoid nephrotoxic agents  Paroxysmal Afib - home medication eliquis. No rate control medication listed. - stop heparin prophylaxis and restart eliquis at 2.5mg  bid - monitor HR  HTN - Hypertensive overnight, 186/96 this am. No prn hydralazine received overnight. Not on any BP meds at home. - monitor HTN, d/c  IVF  HFpEF  Mildly hypovolemic on admission. Last echo 2/12 60-65% EF, wall motion normal, G2DD. 20 mg lasix daily PRN hypervolemia.  - stopping IVFs - will consider dose of Lasix today, pending CXR results - monitor volume status  BPH- flomax 0.4 mg daily on med list - restart flomax  Hypothyroidism - TSH normal this admission. - continue home synthroid 172mcg daily  FEN/GI: DYS II per speech Prophylaxis: eliquis  Disposition:  pending ability to wean from O2, SNF placement  Subjective:  Patient denies pain, feels he is back to baseline except for his breathing. Feeling short of breath.  Objective: Temp:  [97.6 F (36.4 C)-98.1 F (36.7 C)] 98.1 F (36.7 C) (04/29 0700) Pulse Rate:  [59-60] 59 (04/29 0700) Resp:  [18-20] 18 (04/29 0700) BP: (151-186)/(60-97) 178/97 (04/29 1045) SpO2:  [99 %] 99 % (04/29 0700) Physical Exam: General: lying in bed, in NAD HEENT: MMM Cardiovascular: RRR, no murmurs Respiratory: CTAB, diffuse wheezing throughout. No rales appreciated. Gastrointestinal: ssoft, NTND, +BS  MSK: no LE edema Derm: mild hyperpigmented skin changes to the lower extremities bilaterally Neuro: at neurologic baseline. left facial droop present, 0/5 muscle strength in the LUE and LLE, 5/5 muscle strength on the right.  Laboratory: Recent Labs  Lab 08/10/17 1104 08/11/17 0918 08/12/17 0415  WBC 7.1 7.0 6.6  HGB 12.7* 11.3* 11.2*  HCT 39.3 34.8* 34.8*  PLT 179 167 183   Recent Labs  Lab 08/10/17 1104 08/11/17 0918 08/12/17 0415  NA 139 142 140  K 3.9 3.6 3.6  CL 104 107  110  CO2 27 22 23   BUN 24* 22* 18  CREATININE 1.53* 1.34* 1.27*  CALCIUM 8.2* 7.5* 7.9*  PROT 6.0*  --   --   BILITOT 0.8  --   --   ALKPHOS 74  --   --   ALT 14*  --   --   AST 17  --   --   GLUCOSE 105* 73 76    Imaging/Diagnostic Tests: -CT head- prior right MCA and left PCA territory infarcts; chronic left lacunar infarct and chronic microvascular ischemic disease -MRI  brain- no acute finding; large remote right MCA infarct. Moderate remote left occipital infarct.  Rory Percy, DO 08/12/2017, 1:53 PM PGY-1, [place] Intern pager: [phone], text pages welcome

## 2017-08-12 NOTE — Progress Notes (Signed)
  Speech Language Pathology Treatment: Dysphagia  Patient Details Name: Derek Hays MRN: [id number] DOB: [birth year] Today's Date: 08/12/2017 Time: 2947-6546 SLP Time Calculation (min) (ACUTE ONLY): 21 min  Assessment / Plan / Recommendation Clinical Impression  Pt has not had breakfast or lunch today and declined solid PO trials with SLP, saying that he did not have an appetite. He did consume thin liquids with one immediate cough and mild audible wetness, increase in RR. CXR on previous date is now suggesting a RLL infiltrate, and MD present notes an increase in wheezing today. SLP provided trials of nectar thick liquids with no overt signs of difficulty or changes in respirations. Given the above, as well as recommendations for nectar thick liquids on recent MBS, recommend changing diet orders to Dys 2 textures with nectar thick liquids. SLP will f/u for tolerance versus need to repeat MBS.   HPI HPI: Pt is an 84 y.o. male presenting with AMS. MRI negative for acute findings. CXR without evidence of PNA. Pt was seen by SLP following recent CVA in February 2019, with two MBS recommending Dys 1 diet and nectar thick liquids due to silent aspiration/penetration of thin liquids. Pt was clinically advanced to thin liquids prior to discharge. PMH is significant for recent stroke, paroxysmal afib off eliquis due to ?recent SDH, HTN, hypothyroid      SLP Plan  Continue with current plan of care       Recommendations  Diet recommendations: Dysphagia 2 (fine chop);Nectar-thick liquid Liquids provided via: Cup Medication Administration: Whole meds with puree Supervision: Patient able to self feed;Full supervision/cueing for compensatory strategies Compensations: Slow rate;Small sips/bites;Minimize environmental distractions Postural Changes and/or Swallow Maneuvers: Seated upright 90 degrees                Oral Care Recommendations: Oral care BID Follow up Recommendations: Skilled  Nursing facility SLP Visit Diagnosis: Dysphagia, oropharyngeal phase (R13.12) Plan: Continue with current plan of care       GO                [name] 08/12/2017, 3:13 PM  [name], M.A. CCC-SLP [phone]

## 2017-08-12 NOTE — NC FL2 (Signed)
[place] MEDICAID FL2 LEVEL OF CARE SCREENING TOOL     IDENTIFICATION  Patient Name: Derek Hays Birthdate: 1930/09/09 Sex: male Admission Date (Current Location): 08/10/2017  [facility] and [place] Number:  Herbalist and Address:  [facility], [employer] [address] [place], [place] [address]      Provider Number: [id number]  Attending Physician Name and Address:  Dickie La, MD  Relative Name and Phone Number:  Basilia Jumbo, spouse, [phone]    Current Level of Care: Hospital Recommended Level of Care: [place] Prior Approval Number:    Date Approved/Denied:   PASRR Number: [id number]  Discharge Plan: SNF    Current Diagnoses: Patient Active Problem List   Diagnosis Date Noted  . Palliative care by specialist   . Sepsis due to urinary tract infection ([place])   . Altered mental status 08/10/2017  . Acute blood loss anemia   . Stage 3 chronic kidney disease ([place])   . PAF (paroxysmal atrial fibrillation) ([place])   . Benign essential HTN   . Dysphagia, post-stroke   . 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 Stroke (cerebrum) ([place]) 05/27/2017    Orientation RESPIRATION BLADDER Height & Weight     Self, Situation, Time, Place  Normal Incontinent Weight: 98.9 kg (218 lb) Height:  6' (182.9 cm)  BEHAVIORAL SYMPTOMS/MOOD NEUROLOGICAL BOWEL NUTRITION STATUS      Incontinent Diet(Please see DC Summary)  AMBULATORY STATUS COMMUNICATION OF NEEDS Skin   Extensive Assist Verbally Normal                       Personal Care Assistance Level of Assistance  Bathing, Feeding, Dressing Bathing Assistance: Maximum assistance Feeding assistance: Independent Dressing Assistance: Limited assistance     Functional Limitations Info  Hearing   Hearing Info: Impaired      SPECIAL CARE FACTORS FREQUENCY  PT (By licensed PT), OT (By licensed OT)     PT  Frequency: 5x/week OT Frequency: 3x/week            Contractures      Additional Factors Info  Code Status, Allergies Code Status Info: DNR Allergies Info: NKA           Current Medications (08/12/2017):  This is the current hospital active medication list Current Facility-Administered Medications  Medication Dose Route Frequency Provider Last Rate Last Dose  . amLODipine (NORVASC) tablet 5 mg  5 mg Oral Daily Rumball, Alison, DO      . apixaban (ELIQUIS) tablet 5 mg  5 mg Oral BID Lovenia Kim, MD      . atorvastatin (LIPITOR) tablet 40 mg  40 mg Oral q1800 Rory Percy, DO      . hydrALAZINE (APRESOLINE) injection 5 mg  5 mg Intravenous Q4H PRN Bonnita Hollow, MD   5 mg at 08/12/17 1056  . ipratropium-albuterol (DUONEB) 0.5-2.5 (3) MG/3ML nebulizer solution 3 mL  3 mL Nebulization Q4H Everrett Coombe, MD   3 mL at 08/12/17 1447  . levofloxacin (LEVAQUIN) tablet 500 mg  500 mg Oral Daily Mayo, Pete Pelt, MD   500 mg at 08/12/17 1048  . levothyroxine (SYNTHROID, LEVOTHROID) tablet 112 mcg  112 mcg Oral QAC breakfast Everrett Coombe, MD   112 mcg at 08/12/17 1048  . MEDLINE mouth rinse  15 mL Mouth Rinse BID Dickie La, MD   15 mL at 08/12/17 1050  .  tamsulosin (FLOMAX) capsule 0.4 mg  0.4 mg Oral QPC supper Everrett Coombe, MD   0.4 mg at 08/11/17 1747  . timolol (TIMOPTIC) 0.5 % ophthalmic solution 1 drop  1 drop Left Eye q morning - 10a Everrett Coombe, MD   1 drop at 08/12/17 1051     Discharge Medications: Please see discharge summary for a list of discharge medications.  Relevant Imaging Results:  Relevant Lab Results:   Additional Information SSN: [id number]   Benard Halsted, LCSWA

## 2017-08-12 NOTE — Plan of Care (Signed)
  Problem: Clinical Measurements: Goal: Diagnostic test results will improve Outcome: Progressing Goal: Respiratory complications will improve Outcome: Progressing   

## 2017-08-12 NOTE — Clinical Social Work Note (Signed)
Clinical Social Work Assessment  Patient Details  Name: Derek Hays MRN: [id number] Date of Birth: 25-Jan-1931  Date of referral:  08/12/17               Reason for consult:  Discharge Planning                Permission sought to share information with:  Facility Sport and exercise psychologist, Family Supports Permission granted to share information::  Yes, Verbal Permission Granted  Name::     [employer]::  [employer]  Relationship::  Spouse  Contact Information:  [phone]  Housing/Transportation Living arrangements for the past 2 months:  [place] of Information:  Patient, Spouse Patient Interpreter Needed:  None Criminal Activity/Legal Involvement Pertinent to Current Situation/Hospitalization:  No - Comment as needed Significant Relationships:  Spouse Lives with:  Spouse Do you feel safe going back to the place where you live?  Yes Need for family participation in patient care:  No (Coment)  Care giving concerns:  CSW received consult for possible SNF placement at time of discharge. CSW spoke with patient and his spouse. Patient reported that he has been at [facility] for short term rehab and would like to return at discharge. CSW to continue to follow and assist with discharge planning needs.   Social Worker assessment / plan:  CSW spoke with patient and his wife concerning possibility of returning to rehab at [facility] before returning home.  Employment status:  Retired Forensic scientist:  Medicare PT Recommendations:  [place] / Referral to community resources:  [place]  Patient/Family's Response to care:  Patient and spouse report agreement with discharge plan. Spouse will be in touch with facility regarding paperwork.   Patient/Family's Understanding of and Emotional Response to Diagnosis, Current Treatment, and Prognosis:  Patient/family is realistic regarding therapy  needs and expressed being hopeful for return to SNF placement. Patient expressed understanding of CSW role and discharge process as well as medical condition. No questions/concerns about plan or treatment.    Emotional Assessment Appearance:  Appears stated age Attitude/Demeanor/Rapport:  Gracious Affect (typically observed):  Accepting, Appropriate Orientation:  Oriented to Self, Oriented to Situation, Oriented to Place, Oriented to  Time Alcohol / Substance use:  Not Applicable Psych involvement (Current and /or in the community):  No (Comment)  Discharge Needs  Concerns to be addressed:  Care Coordination Readmission within the last 30 days:  No Current discharge risk:  None Barriers to Discharge:  Continued Medical Work up   [employer], [place] 08/12/2017, 3:37 PM

## 2017-08-12 NOTE — Discharge Summary (Signed)
[facility] Discharge Summary  Patient name: Derek Hays Medical record number: [id number] Date of birth: 1930-09-09 Age: 84 y.o. Gender: male Date of Admission: 08/10/2017  Date of Discharge: 08/13/2017 Admitting Physician: Dickie La, MD  Primary Care Provider: Patient, No Pcp Per Consultants: Palliative  Indication for Hospitalization: AMS  Discharge Diagnoses/Problem List:  AMS 2/2 HAP/UTI Hx MCA stroke 05/2017 Hemoglobinuria CKD III Paroxysmal Afib HTN HFpEF BPH Hypothyroidism  Disposition: SNF  Discharge Condition: Improved  Discharge Exam:  General:elderly male lying in bed, in NAD HEENT: MMM Cardiovascular:RRR, no murmurs Respiratory:CTAB, diffuse wheezing throughout although improved from yesterday. Rhonchorous sounds appreciated in RLL. Gastrointestinal:soft, NTND, +BS  MSK: no LE edema Neuro:at neurologic baseline. left facial droop present, 0/5 muscle strength in the LUE and LLE, 5/5 muscle strength on the right.  Brief Hospital Course:  Derek Hays a 84 y.o.malewith PMH significant for recent stroke, paroxysmal afib off eliquis due to ?recent SDH, HTN, hypothyroid who presented with AMS. In the ED, U/A notable for moderate leuks, Hb and history of dysuria. CXR showed R pleural effusion and overlying atelectasis but could not rule out pneumonia. Patient was subsequently started on Vancomycin and Zosyn in the ED to cover infectious causes of AMS (UTI, pneumonia). Stroke work up completed including CT Head and MRI Brain which were negative. Per wife on admission patient also had shortness of breath and productive cough x1day so thought possible pneumonia could be contributing. The day following admission, patient also noted to have new oxygen requirement due to desats in the 80s and provided oxygen which was able to be weaned prior to discharge. Urine culture grew 40k colonies of E. Faecalis and antibiotics were subsequently  narrowed to Levaquin to cover both pulmonary and urinary sources of infection which he tolerated. Patient remained at his neurologic baseline throughout admission with 0/5 strength to R U/LE. Patient also presented with AKI which resolved prior to discharge. Patient also started on Norvasc for hypertension with SBP 180s.  Issues for Follow Up:  1. Started on Levaquin for HAP/UTI, last day 5/5. 2. Started Norvasc 10mg  for hypertension, monitor BP. 3. Changed Simvastatin to Atorvastatin given higher risk of muscle cramps with Levaquin. Would also benefit from higher intensity statin given CVA history.   Significant Procedures: None  Significant Labs and Imaging:  Recent Labs  Lab 08/10/17 1104 08/11/17 0918 08/12/17 0415  WBC 7.1 7.0 6.6  HGB 12.7* 11.3* 11.2*  HCT 39.3 34.8* 34.8*  PLT 179 167 183   Recent Labs  Lab 08/10/17 1104 08/10/17 1935 08/11/17 0918 08/12/17 0415 08/13/17 0800  NA 139  --  142 140 139  K 3.9  --  3.6 3.6 3.6  CL 104  --  107 110 106  CO2 27  --  22 23 22   GLUCOSE 105*  --  73 76 90  BUN 24*  --  22* 18 19  CREATININE 1.53*  --  1.34* 1.27* 1.13  CALCIUM 8.2*  --  7.5* 7.9* 8.6*  MG  --  1.9  --   --   --   PHOS  --  3.9  --   --   --   ALKPHOS 74  --   --   --   --   AST 17  --   --   --   --   ALT 14*  --   --   --   --   ALBUMIN 2.6*  --   --   --   --  Dg Chest 2 View  Result Date: 08/11/2017 CLINICAL DATA:  Shortness of breath. EXAM: CHEST - 2 VIEW COMPARISON:  08/10/2017 FINDINGS: Stable cardiomegaly. Dual lead pacemaker remains in appropriate position. Aortic atherosclerosis. Small right pleural effusion is again seen with right basilar atelectasis or infiltrate. Left lung remains clear. IMPRESSION: No significant change in small right pleural effusion and right basilar atelectasis versus infiltrate. Electronically Signed   By: Earle Gell M.D.   On: 08/11/2017 12:35   Ct Head Wo Contrast  Result Date: 08/10/2017 CLINICAL DATA:   84 year old male with altered level of consciousness, history of prior right MCA territory infarct. EXAM: CT HEAD WITHOUT CONTRAST TECHNIQUE: Contiguous axial images were obtained from the base of the skull through the vertex without intravenous contrast. COMPARISON:  None. FINDINGS: Brain: Encephalomalacia throughout the right temporal lobe consistent with prior right MCA territory infarct. Focal encephalomalacia in the left occipital lobe is also present consistent with a remote prior infarct. Remote lacunar infarct in the left subinsular region is also similar compared to prior. No evidence of acute hemorrhage, new acute stroke or mass effect. Small extra-axial dural-based mass lesion overlying the left occipital parietal region remains unchanged and likely represents a small benign meningioma. Cerebral cortical atrophy is again noted as is chronic microvascular ischemic white matter disease. Vascular: No hyperdense vessel or unexpected calcification. Skull: Normal. Negative for fracture or focal lesion. Sinuses/Orbits: No acute finding. Other: None. IMPRESSION: 1. No acute intracranial abnormality. 2. Expected sequelae of prior right MCA and left PCA territory infarcts. 3. Atrophy, chronic left subinsular lacunar infarct and chronic microvascular ischemic white matter disease. Electronically Signed   By: Jacqulynn Cadet M.D.   On: 08/10/2017 13:09   Mr Brain Wo Contrast  Result Date: 08/10/2017 CLINICAL DATA:  Altered level of consciousness EXAM: MRI HEAD WITHOUT CONTRAST TECHNIQUE: Multiplanar, multiecho pulse sequences of the brain and surrounding structures were obtained without intravenous contrast. COMPARISON:  Head CT from earlier today.  Brain MRI 05/28/2017 FINDINGS: Brain: Remote right MCA territory infarct affecting the majority of the territory with cystic encephalomalacia and peripheral gliosis that shows facilitated diffusion. There is patchy mineralization in areas of necrosis. Moderate  left occipital and small left frontal parietal infarcts that occurred at the same time. There is chronic small vessel ischemia in the cerebral white matter, mild to moderate. No acute infarct,  hemorrhage, hydrocephalus, or collection. High left parietal meningioma measuring 11 mm, incidental. Generalized atrophy Vascular: Major flow voids are preserved Skull and upper cervical spine: No evidence of marrow lesion. Sinuses/Orbits: Bilateral cataract resection. IMPRESSION: 1. No acute finding including acute infarct. 2. Large remote right MCA territory infarct. Moderate remote left occipital infarct. Electronically Signed   By: Monte Fantasia M.D.   On: 08/10/2017 21:46   Dg Chest Port 1 View  Result Date: 08/10/2017 CLINICAL DATA:  Altered mental status. EXAM: PORTABLE CHEST 1 VIEW COMPARISON:  05/29/2017 FINDINGS: The pacer wires are stable. The cardiac silhouette, mediastinal and hilar contours are stable. Stable tortuosity and calcification of the thoracic aorta. The lung bases are slightly better aerated. There is a persistent small layering right pleural effusion and overlying atelectasis. No pulmonary edema or pneumothorax. IMPRESSION: Slight improved basilar aeration. Persistent small right effusion and overlying atelectasis. Electronically Signed   By: Marijo Sanes M.D.   On: 08/10/2017 11:28   Results/Tests Pending at Time of Discharge: None  Discharge Medications:  Allergies as of 08/13/2017   No Known Allergies     Medication List  STOP taking these medications   simvastatin 40 MG tablet Commonly known as:  ZOCOR     TAKE these medications   acetaminophen 500 MG tablet Commonly known as:  TYLENOL Take 500-1,000 mg by mouth every 4 (four) hours as needed for headache (pain).   albuterol (2.5 MG/3ML) 0.083% nebulizer solution Commonly known as:  PROVENTIL Take 3 mLs (2.5 mg total) by nebulization every 6 (six) hours.   amLODipine 10 MG tablet Commonly known as:  NORVASC Take  1 tablet (10 mg total) by mouth daily. Start taking on:  08/14/2017   apixaban 5 MG Tabs tablet Commonly known as:  ELIQUIS Take 1 tablet (5 mg total) by mouth 2 (two) times daily.   atorvastatin 40 MG tablet Commonly known as:  LIPITOR Take 1 tablet (40 mg total) by mouth daily at 6 PM.   bethanechol 25 MG tablet Commonly known as:  URECHOLINE Take 1 tablet (25 mg total) by mouth 3 (three) times daily. What changed:  additional instructions   furosemide 20 MG tablet Commonly known as:  LASIX Take 20 mg by mouth daily as needed for fluid or edema.   GERI-TUSSIN DM 10-100 MG/5ML liquid Generic drug:  Dextromethorphan-guaiFENesin Take 15 mLs by mouth every 4 (four) hours as needed (cough/congestion).   levofloxacin 500 MG tablet Commonly known as:  LEVAQUIN Take 1 tablet (500 mg total) by mouth daily for 4 days. Start taking on:  08/14/2017   levothyroxine 112 MCG tablet Commonly known as:  SYNTHROID, LEVOTHROID Take 112 mcg by mouth daily.   pregabalin 150 MG capsule Commonly known as:  LYRICA Take 1 capsule (150 mg total) by mouth 2 (two) times daily.   REFRESH TEARS 0.5 % Soln Generic drug:  carboxymethylcellulose Place 1 drop into both eyes every 6 (six) hours as needed (dry eyes).   SENNA-PLUS 8.6-50 MG tablet Generic drug:  senna-docusate Take 2 tablets by mouth 2 (two) times daily.   tamsulosin 0.4 MG Caps capsule Commonly known as:  FLOMAX Take 1 capsule (0.4 mg total) by mouth daily after supper. What changed:  when to take this   timolol 0.5 % ophthalmic solution Commonly known as:  TIMOPTIC Place 1 drop into the left eye daily.   XALATAN 0.005 % ophthalmic solution Generic drug:  latanoprost Place 1 drop into both eyes at bedtime.       Discharge Instructions: Please refer to Patient Instructions section of EMR for full details.  Patient was counseled important signs and symptoms that should prompt return to medical care, changes in medications,  dietary instructions, activity restrictions, and follow up appointments.   Follow-Up Appointments: Contact information for after-discharge care    Destination    [facility] .   Service:  Skilled Nursing Contact information: [address] [place], [place], [place] 08/13/2017, 11:54 AM PGY-1, [place]

## 2017-08-13 DIAGNOSIS — E039 Hypothyroidism, unspecified: Secondary | ICD-10-CM | POA: Diagnosis present

## 2017-08-13 DIAGNOSIS — M25512 Pain in left shoulder: Secondary | ICD-10-CM | POA: Diagnosis not present

## 2017-08-13 DIAGNOSIS — I69354 Hemiplegia and hemiparesis following cerebral infarction affecting left non-dominant side: Secondary | ICD-10-CM | POA: Diagnosis not present

## 2017-08-13 DIAGNOSIS — I639 Cerebral infarction, unspecified: Secondary | ICD-10-CM | POA: Diagnosis not present

## 2017-08-13 DIAGNOSIS — Z7409 Other reduced mobility: Secondary | ICD-10-CM | POA: Diagnosis not present

## 2017-08-13 DIAGNOSIS — M6281 Muscle weakness (generalized): Secondary | ICD-10-CM | POA: Diagnosis not present

## 2017-08-13 DIAGNOSIS — N183 Chronic kidney disease, stage 3 (moderate): Secondary | ICD-10-CM | POA: Diagnosis not present

## 2017-08-13 DIAGNOSIS — H04129 Dry eye syndrome of unspecified lacrimal gland: Secondary | ICD-10-CM | POA: Diagnosis not present

## 2017-08-13 DIAGNOSIS — R1312 Dysphagia, oropharyngeal phase: Secondary | ICD-10-CM | POA: Diagnosis not present

## 2017-08-13 DIAGNOSIS — I69315 Cognitive social or emotional deficit following cerebral infarction: Secondary | ICD-10-CM | POA: Diagnosis not present

## 2017-08-13 DIAGNOSIS — G9341 Metabolic encephalopathy: Secondary | ICD-10-CM | POA: Diagnosis present

## 2017-08-13 DIAGNOSIS — Z79899 Other long term (current) drug therapy: Secondary | ICD-10-CM | POA: Diagnosis not present

## 2017-08-13 DIAGNOSIS — K5900 Constipation, unspecified: Secondary | ICD-10-CM | POA: Diagnosis not present

## 2017-08-13 DIAGNOSIS — R4182 Altered mental status, unspecified: Secondary | ICD-10-CM | POA: Diagnosis not present

## 2017-08-13 DIAGNOSIS — I63511 Cerebral infarction due to unspecified occlusion or stenosis of right middle cerebral artery: Secondary | ICD-10-CM | POA: Diagnosis not present

## 2017-08-13 DIAGNOSIS — E785 Hyperlipidemia, unspecified: Secondary | ICD-10-CM | POA: Diagnosis not present

## 2017-08-13 DIAGNOSIS — I129 Hypertensive chronic kidney disease with stage 1 through stage 4 chronic kidney disease, or unspecified chronic kidney disease: Secondary | ICD-10-CM | POA: Diagnosis present

## 2017-08-13 DIAGNOSIS — R609 Edema, unspecified: Secondary | ICD-10-CM | POA: Diagnosis not present

## 2017-08-13 DIAGNOSIS — H353290 Exudative age-related macular degeneration, unspecified eye, stage unspecified: Secondary | ICD-10-CM | POA: Diagnosis not present

## 2017-08-13 DIAGNOSIS — I69822 Dysarthria following other cerebrovascular disease: Secondary | ICD-10-CM | POA: Diagnosis not present

## 2017-08-13 DIAGNOSIS — N401 Enlarged prostate with lower urinary tract symptoms: Secondary | ICD-10-CM | POA: Diagnosis not present

## 2017-08-13 DIAGNOSIS — E860 Dehydration: Secondary | ICD-10-CM | POA: Diagnosis present

## 2017-08-13 DIAGNOSIS — Z7901 Long term (current) use of anticoagulants: Secondary | ICD-10-CM | POA: Diagnosis not present

## 2017-08-13 DIAGNOSIS — Z87891 Personal history of nicotine dependence: Secondary | ICD-10-CM | POA: Diagnosis not present

## 2017-08-13 DIAGNOSIS — R05 Cough: Secondary | ICD-10-CM | POA: Diagnosis not present

## 2017-08-13 DIAGNOSIS — I480 Paroxysmal atrial fibrillation: Secondary | ICD-10-CM | POA: Diagnosis not present

## 2017-08-13 DIAGNOSIS — G8190 Hemiplegia, unspecified affecting unspecified side: Secondary | ICD-10-CM | POA: Diagnosis not present

## 2017-08-13 DIAGNOSIS — I69391 Dysphagia following cerebral infarction: Secondary | ICD-10-CM | POA: Diagnosis not present

## 2017-08-13 DIAGNOSIS — R278 Other lack of coordination: Secondary | ICD-10-CM | POA: Diagnosis not present

## 2017-08-13 DIAGNOSIS — Z8551 Personal history of malignant neoplasm of bladder: Secondary | ICD-10-CM | POA: Diagnosis not present

## 2017-08-13 DIAGNOSIS — Y95 Nosocomial condition: Secondary | ICD-10-CM | POA: Diagnosis present

## 2017-08-13 DIAGNOSIS — R600 Localized edema: Secondary | ICD-10-CM | POA: Diagnosis not present

## 2017-08-13 DIAGNOSIS — J9601 Acute respiratory failure with hypoxia: Secondary | ICD-10-CM | POA: Diagnosis not present

## 2017-08-13 DIAGNOSIS — I10 Essential (primary) hypertension: Secondary | ICD-10-CM | POA: Diagnosis not present

## 2017-08-13 DIAGNOSIS — J189 Pneumonia, unspecified organism: Secondary | ICD-10-CM | POA: Diagnosis not present

## 2017-08-13 DIAGNOSIS — R5381 Other malaise: Secondary | ICD-10-CM | POA: Diagnosis not present

## 2017-08-13 DIAGNOSIS — F0390 Unspecified dementia without behavioral disturbance: Secondary | ICD-10-CM | POA: Diagnosis present

## 2017-08-13 DIAGNOSIS — N3000 Acute cystitis without hematuria: Secondary | ICD-10-CM | POA: Diagnosis not present

## 2017-08-13 DIAGNOSIS — J40 Bronchitis, not specified as acute or chronic: Secondary | ICD-10-CM | POA: Diagnosis not present

## 2017-08-13 DIAGNOSIS — I959 Hypotension, unspecified: Secondary | ICD-10-CM | POA: Diagnosis not present

## 2017-08-13 DIAGNOSIS — R0902 Hypoxemia: Secondary | ICD-10-CM | POA: Diagnosis not present

## 2017-08-13 DIAGNOSIS — I4891 Unspecified atrial fibrillation: Secondary | ICD-10-CM | POA: Diagnosis not present

## 2017-08-13 DIAGNOSIS — R402441 Other coma, without documented Glasgow coma scale score, or with partial score reported, in the field [EMT or ambulance]: Secondary | ICD-10-CM | POA: Diagnosis not present

## 2017-08-13 DIAGNOSIS — R079 Chest pain, unspecified: Secondary | ICD-10-CM | POA: Diagnosis not present

## 2017-08-13 DIAGNOSIS — N390 Urinary tract infection, site not specified: Secondary | ICD-10-CM | POA: Diagnosis present

## 2017-08-13 DIAGNOSIS — Z950 Presence of cardiac pacemaker: Secondary | ICD-10-CM | POA: Diagnosis not present

## 2017-08-13 DIAGNOSIS — B9620 Unspecified Escherichia coli [E. coli] as the cause of diseases classified elsewhere: Secondary | ICD-10-CM | POA: Diagnosis present

## 2017-08-13 DIAGNOSIS — I69321 Dysphasia following cerebral infarction: Secondary | ICD-10-CM | POA: Diagnosis not present

## 2017-08-13 DIAGNOSIS — R0989 Other specified symptoms and signs involving the circulatory and respiratory systems: Secondary | ICD-10-CM | POA: Diagnosis not present

## 2017-08-13 DIAGNOSIS — G464 Cerebellar stroke syndrome: Secondary | ICD-10-CM | POA: Diagnosis not present

## 2017-08-13 DIAGNOSIS — H409 Unspecified glaucoma: Secondary | ICD-10-CM | POA: Diagnosis not present

## 2017-08-13 DIAGNOSIS — R339 Retention of urine, unspecified: Secondary | ICD-10-CM | POA: Diagnosis not present

## 2017-08-13 DIAGNOSIS — R293 Abnormal posture: Secondary | ICD-10-CM | POA: Diagnosis not present

## 2017-08-13 LAB — BASIC METABOLIC PANEL
ANION GAP: 11 (ref 5–15)
BUN: 19 mg/dL (ref 6–20)
CALCIUM: 8.6 mg/dL — AB (ref 8.9–10.3)
CHLORIDE: 106 mmol/L (ref 101–111)
CO2: 22 mmol/L (ref 22–32)
Creatinine, Ser: 1.13 mg/dL (ref 0.61–1.24)
GFR calc non Af Amer: 58 mL/min — ABNORMAL LOW (ref >60)
Glucose, Bld: 90 mg/dL (ref 65–99)
Potassium: 3.6 mmol/L (ref 3.5–5.1)
Sodium: 139 mmol/L (ref 135–145)

## 2017-08-13 MED ORDER — ATORVASTATIN CALCIUM 40 MG PO TABS: 40.0000 mg | ORAL_TABLET | Freq: Every day | ORAL | 0 refills | Status: DC

## 2017-08-13 MED ORDER — AMLODIPINE BESYLATE 5 MG PO TABS: 5.0000 mg | ORAL_TABLET | Freq: Every day | ORAL | Status: DC

## 2017-08-13 MED ORDER — AMLODIPINE BESYLATE 5 MG PO TABS
5.0000 mg | ORAL_TABLET | Freq: Every day | ORAL | Status: AC
  Administered 2017-08-13: 5 mg via ORAL
  Filled 2017-08-13: qty 1

## 2017-08-13 MED ORDER — AMLODIPINE BESYLATE 10 MG PO TABS: 10.0000 mg | ORAL_TABLET | Freq: Every day | ORAL | 0 refills | Status: DC

## 2017-08-13 MED ORDER — ALBUTEROL SULFATE (2.5 MG/3ML) 0.083% IN NEBU
2.5000 mg | INHALATION_SOLUTION | Freq: Four times a day (QID) | RESPIRATORY_TRACT | Status: DC
  Filled 2017-08-13: qty 3

## 2017-08-13 MED ORDER — LEVOFLOXACIN 500 MG PO TABS: 500.0000 mg | ORAL_TABLET | Freq: Every day | ORAL | 0 refills | Status: AC

## 2017-08-13 MED ORDER — AMLODIPINE BESYLATE 10 MG PO TABS: 10.0000 mg | ORAL_TABLET | Freq: Every day | ORAL | Status: DC

## 2017-08-13 MED ORDER — ALBUTEROL SULFATE (2.5 MG/3ML) 0.083% IN NEBU: 2.5000 mg | INHALATION_SOLUTION | Freq: Four times a day (QID) | RESPIRATORY_TRACT | 12 refills | Status: AC

## 2017-08-13 NOTE — Progress Notes (Signed)
Pt prepared for d/c to SNF. IV d/c'd. Skin intact except as charted in most recent assessments. Vitals are stable. Attempted report to receiving facility, call transferred but no one ever answered; waited 10 min. Pt transported by ambulance service.

## 2017-08-13 NOTE — Progress Notes (Signed)
Family Medicine Teaching Service Daily Progress Note Intern Pager: [phone]  Patient name: Derek Hays Medical record number: [id number] Date of birth: 1931-04-11 Age: 84 y.o. Gender: male  Primary Care Provider: Patient, No Pcp Per Consultants: Palliative Code Status: DNR  Pt Overview and Major Events to Date:  4/27: Admitted to [facility] with acute encephalopathy  Assessment and Plan: MYKA LUKINS is a 84 y.o. male presenting with AMS . PMH is significant for recent stroke, paroxysmal afib off eliquis due to ?recent SDH, HTN, hypothyroid,  AMS (resolved) likely 2/2 to HAP/UTI - AMS continues to be resolved. Diffuse wheezing on exam improved with scheduled duonebs. Continues to be afebrile however required O2 overnight with no documented desats. Weaned to RA at the time of exam this morning. Can likely be d/ced back to SNF with oxygen today if desats again. - monitor on telemetry - s/p Vanc/Zosyn  - continue levaquin (4/28 - 5/5) - sched duoneb - consider prednisone - wean O2 as able - 2L  - blood and urine cultures pending - blood cx NG>48hrs, urine cx 40k E. Faecalis - Palliative - continue medical management, could benefit from outpt palliative referral - SLP consulted - Dys 2  Hx MCA stroke 05/2017 - At baseline neurologically. - restart simvastatin - restart Eliquis 5mg  BID  Hemoglobinuria - noted on admit UA. >50 RBCs, large hgb, appearance amber. May have been traumatic cath. Patient does have hx bladder cancer in 1984 per chart review. Hgb is stable. - monitor hgb - repeat UA as an outpatient  CKD III - Creatinine 1.53>1.27 (1.39 last month), morning labs pending. Likely prerenal in the setting of decreased PO. - stop IVF, encourage PO - trend BMP - avoid nephrotoxic agents  Paroxysmal Afib - home medication eliquis. No rate control medication listed. - stop heparin prophylaxis and restart eliquis at 2.5mg  bid - monitor HR  HTN - Hypertensive overnight,  176/110 this am. Started norvasc yesterday. Received 3 doses prn hydralazine overnight. Not on any BP meds at home. - monitor HTN - prn hydral - continue norvasc  HFpEF  Euvolemic on exam. Last echo 2/12 60-65% EF, wall motion normal, G2DD. 20 mg lasix daily PRN hypervolemia.  - monitor volume status  BPH- flomax 0.4 mg daily on med list - restart flomax  Hypothyroidism - TSH normal this admission. - continue home synthroid 164mcg daily  FEN/GI: DYS II per speech Prophylaxis: eliquis  Disposition:  SNF placement  Subjective:  Patient feeling ok this morning. Understands likely discharge and is amenable.  Objective: Temp:  [97.2 F (36.2 C)-98.6 F (37 C)] 98 F (36.7 C) (04/30 0442) Pulse Rate:  [59-60] 60 (04/30 0442) Resp:  [17-20] 17 (04/30 0442) BP: (173-192)/(84-113) 176/110 (04/30 0442) SpO2:  [96 %-98 %] 98 % (04/30 0737) Physical Exam: General: elderly male lying in bed, in NAD HEENT: MMM Cardiovascular: RRR, no murmurs Respiratory: CTAB, diffuse wheezing throughout although improved from yesterday. Rhonchorous sounds appreciated in RLL. Gastrointestinal: soft, NTND, +BS  MSK: no LE edema Neuro: at neurologic baseline. left facial droop present, 0/5 muscle strength in the LUE and LLE, 5/5 muscle strength on the right.  Laboratory: Recent Labs  Lab 08/10/17 1104 08/11/17 0918 08/12/17 0415  WBC 7.1 7.0 6.6  HGB 12.7* 11.3* 11.2*  HCT 39.3 34.8* 34.8*  PLT 179 167 183   Recent Labs  Lab 08/10/17 1104 08/11/17 0918 08/12/17 0415  NA 139 142 140  K 3.9 3.6 3.6  CL 104 107 110  CO2  27 22 23   BUN 24* 22* 18  CREATININE 1.53* 1.34* 1.27*  CALCIUM 8.2* 7.5* 7.9*  PROT 6.0*  --   --   BILITOT 0.8  --   --   ALKPHOS 74  --   --   ALT 14*  --   --   AST 17  --   --   GLUCOSE 105* 73 76    Imaging/Diagnostic Tests: -CT head- prior right MCA and left PCA territory infarcts; chronic left lacunar infarct and chronic microvascular ischemic  disease -MRI brain- no acute finding; large remote right MCA infarct. Moderate remote left occipital infarct.  Rory Percy, DO 08/13/2017, 9:36 AM PGY-1, [place] Intern pager: [phone], text pages welcome

## 2017-08-13 NOTE — Progress Notes (Signed)
Pt stated that Duonb caused nausea.  Attempt nb for 2 minutest again complained.  Stopped neb at that point.  VSS.  RN notified

## 2017-08-13 NOTE — Progress Notes (Signed)
Patient will DC to: [facility] Anticipated DC date: 08/13/17 Family notified: Spouse Transport by: Corey Harold    Per MD patient ready for DC to [facility]. RN, patient, patient's family, and facility notified of DC. Discharge Summary sent to facility. RN given number for report [phone]). DC packet on chart. Ambulance transport requested for patient.   CSW signing off.  Cedric Fishman, LCSW Clinical Social Worker [phone]

## 2017-08-13 NOTE — Progress Notes (Signed)
  Speech Language Pathology Treatment: Dysphagia  Patient Details Name: Derek Hays MRN: [id number] DOB: November 17, 1930 Today's Date: 08/13/2017 Time: 0915-0928 SLP Time Calculation (min) (ACUTE ONLY): 13 min  Assessment / Plan / Recommendation Clinical Impression  Pt continues to have minimal intake, initially refusing all of his breakfast tray because it was "too sweet" but then ultimately was agreeable eat a few bites of pudding. He had minimal intake but with no overt s/s of aspiration. Min cues were provided for more upright positioning to increase safety. Pt would only try the pre-thickened milk off of his tray, which happened to be honey thick, and upon realizing how thick it was, he would not try any more. Again, no signs of difficulty were noted but with minimal intake. All other liquids offered were either "too sweet" or he didn't like them because they were unsweetened. Given how limited his intake is despite Max encouragement, MBS is not something we can really pursue at this time, but may be appropriate in the future. Would continue current diet for now.   HPI HPI: Pt is an 84 y.o. male presenting with AMS. MRI negative for acute findings. CXR without evidence of PNA. Pt was seen by SLP following recent CVA in February 2019, with two MBS recommending Dys 1 diet and nectar thick liquids due to silent aspiration/penetration of thin liquids. Pt was clinically advanced to thin liquids prior to discharge. PMH is significant for recent stroke, paroxysmal afib off eliquis due to ?recent SDH, HTN, hypothyroid      SLP Plan  Continue with current plan of care       Recommendations  Diet recommendations: Dysphagia 2 (fine chop);Nectar-thick liquid Liquids provided via: Cup Medication Administration: Whole meds with puree Supervision: Patient able to self feed;Full supervision/cueing for compensatory strategies Compensations: Slow rate;Small sips/bites;Minimize environmental  distractions Postural Changes and/or Swallow Maneuvers: Seated upright 90 degrees                Oral Care Recommendations: Oral care BID Follow up Recommendations: Skilled Nursing facility SLP Visit Diagnosis: Dysphagia, oropharyngeal phase (R13.12) Plan: Continue with current plan of care       GO                Derek Hays 08/13/2017, 9:51 AM  Derek Hays, M.A. CCC-SLP [phone]

## 2017-08-15 LAB — CULTURE, BLOOD (ROUTINE X 2)
CULTURE: NO GROWTH
Culture: NO GROWTH

## 2017-08-19 ENCOUNTER — Other Ambulatory Visit: Payer: Self-pay

## 2017-08-20 DIAGNOSIS — I480 Paroxysmal atrial fibrillation: Secondary | ICD-10-CM | POA: Diagnosis not present

## 2017-08-20 DIAGNOSIS — I69354 Hemiplegia and hemiparesis following cerebral infarction affecting left non-dominant side: Secondary | ICD-10-CM | POA: Diagnosis not present

## 2017-08-20 DIAGNOSIS — I69315 Cognitive social or emotional deficit following cerebral infarction: Secondary | ICD-10-CM | POA: Diagnosis not present

## 2017-08-20 DIAGNOSIS — I69391 Dysphagia following cerebral infarction: Secondary | ICD-10-CM | POA: Diagnosis not present

## 2017-08-22 ENCOUNTER — Ambulatory Visit (HOSPITAL_BASED_OUTPATIENT_CLINIC_OR_DEPARTMENT_OTHER): Payer: Medicare Other | Admitting: Physical Medicine & Rehabilitation

## 2017-08-22 ENCOUNTER — Other Ambulatory Visit: Payer: Self-pay

## 2017-08-22 ENCOUNTER — Encounter: Payer: Self-pay | Admitting: Physical Medicine & Rehabilitation

## 2017-08-22 ENCOUNTER — Encounter: Payer: No Typology Code available for payment source | Attending: Physical Medicine & Rehabilitation

## 2017-08-22 VITALS — BP 136/85 | HR 60 | Ht 72.0 in | Wt 221.2 lb

## 2017-08-22 DIAGNOSIS — M25512 Pain in left shoulder: Secondary | ICD-10-CM | POA: Diagnosis not present

## 2017-08-22 DIAGNOSIS — I69354 Hemiplegia and hemiparesis following cerebral infarction affecting left non-dominant side: Secondary | ICD-10-CM

## 2017-08-22 DIAGNOSIS — I10 Essential (primary) hypertension: Secondary | ICD-10-CM | POA: Diagnosis not present

## 2017-08-22 DIAGNOSIS — I4891 Unspecified atrial fibrillation: Secondary | ICD-10-CM | POA: Insufficient documentation

## 2017-08-22 DIAGNOSIS — Z87891 Personal history of nicotine dependence: Secondary | ICD-10-CM | POA: Insufficient documentation

## 2017-08-22 DIAGNOSIS — R600 Localized edema: Secondary | ICD-10-CM | POA: Diagnosis not present

## 2017-08-22 DIAGNOSIS — I639 Cerebral infarction, unspecified: Secondary | ICD-10-CM | POA: Diagnosis not present

## 2017-08-22 DIAGNOSIS — Z950 Presence of cardiac pacemaker: Secondary | ICD-10-CM | POA: Insufficient documentation

## 2017-08-22 NOTE — Progress Notes (Signed)
Subjective:    Patient ID: Derek Hays, male    DOB: Oct 29, 1930, 84 y.o.   MRN: [id number] 84 year old right-handed male with history of hypertension, atrial fibrillation with pacemaker had been maintained on Eliquis, discontinued due to traumatic subdural hematoma in September 2018.  Repeat head CT scan on May 10, 2017, showed SDH completely reabsorbed, followup by Dr. Bridgette Habermann at [facility].  He lives with spouse, independent with a cane and a walker prior to admission.  Presented on May 27, 2017, with acute onset of left-sided weakness and slurred speech.  CT of the head showed hyperdense right MCA.  No hemorrhage or mass effect.  Subacute appearing left PCA territory infarct.  CT cerebral perfusion scan, emergent large vessel occlusion of the distal M1 segment of the right middle cerebral artery, underwent revascularization.  Echocardiogram with ejection fraction of 10%, grade 2 diastolic dysfunction.  MRI and MRA, acute MCA infarction, right insula, and frontal parietal lobe.  Small acute subacute infarct, left parietal lobe.  MRA with revascularization of occlusion.  Maintained on aspirin and Eliquis.  Resume 10 days post stroke due to size of infarction.  He was on a dysphagia #1 thin thick liquid diet.   HPI   Lethargy has worsened since hospitalization for pneumonia and UTI from 08/10/2017 -08/13/2017 Patient coughing more.  This occurs with meals as well as between mealtime. Patient using sling for left upper extremity management.  Patient does not complain of any pain at rest. Patient mainly wheelchair bound using a [place] for transfers.  Lyrica was increased from 100 mg twice a day to 150 mill grams twice a day after neurology visit with nurse practitioner on 08/05/2017 Pain Inventory Average Pain n/a Pain Right Now n/a My pain is n/a  In the last 24 hours, has pain interfered with the following? General activity n/a Relation with others  n/a Enjoyment of life n/a What TIME of day is your pain at its worst? n/a Sleep (in general) NA  Pain is worse with: n/a Pain improves with: n/a Relief from Meds: n/a  Mobility use a wheelchair needs help with transfers  Function retired I need assistance with the following:  feeding, dressing, bathing, toileting, meal prep, household duties and shopping  Neuro/Psych bladder control problems  Prior Studies Any changes since last visit?  no  Physicians involved in your care Any changes since last visit?  no   Family History  Problem Relation Age of Onset  . Emphysema Father    Social History   Socioeconomic History  . Marital status: Married    Spouse name: Not on file  . Number of children: Not on file  . Years of education: Not on file  . Highest education level: Not on file  Occupational History    Employer: [employer]  Social Needs  . Financial resource strain: Not on file  . Food insecurity:    Worry: Not on file    Inability: Not on file  . Transportation needs:    Medical: Not on file    Non-medical: Not on file  Tobacco Use  . Smoking status: Former Smoker    Packs/day: 1.50    Years: 25.00    Pack years: 37.50    Types: Cigarettes    Last attempt to quit: 06/12/1982    Years since quitting: 35.2  . Smokeless tobacco: Never Used  . Tobacco comment: pt states he quit when dx with bladder ca  Substance and Sexual Activity  .  Alcohol use: Not Currently  . Drug use: No  . Sexual activity: Not on file  Lifestyle  . Physical activity:    Days per week: Not on file    Minutes per session: Not on file  . Stress: To some extent  Relationships  . Social connections:    Talks on phone: More than three times a week    Gets together: Twice a week    Attends religious service: Not on file    Active member of club or organization: Not on file    Attends meetings of clubs or organizations: Never    Relationship status: Not on file  Other Topics Concern    . Not on file  Social History Narrative  . Not on file   Past Surgical History:  Procedure Laterality Date  . IR CT HEAD LTD  05/27/2017  . IR PERCUTANEOUS ART THROMBECTOMY/INFUSION INTRACRANIAL INC DIAG ANGIO  05/27/2017  . PACEMAKER INSERTION    . RADIOLOGY WITH ANESTHESIA N/A 05/27/2017   Procedure: RADIOLOGY WITH ANESTHESIA;  Surgeon: Luanne Bras, MD;  Location: [place];  Service: Radiology;  Laterality: N/A;   Past Medical History:  Diagnosis Date  . Atrial fibrillation ([place])   . Bladder cancer ([place]) 1984  . Hypertension   . Stroke ([place])    BP 136/85   Pulse 60   Ht 6' (1.829 m) Comment: reported  Wt 221 lb 3.2 oz (100.3 kg) Comment: last wt 08/21/17 @snf   SpO2 97% Comment: o2 [place] 3 L  BMI 30.00 kg/m   Opioid Risk Score:   Fall Risk Score:  `1  Depression screen PHQ 2/9  Depression screen PHQ 2/9 08/22/2017  Decreased Interest (No Data)  Down, Depressed, Hopeless (No Data)      Review of Systems  Constitutional: Positive for unexpected weight change.       Sleeping more  HENT: Positive for drooling.   Eyes: Negative.   Respiratory: Positive for cough.   Cardiovascular: Negative.   Gastrointestinal: Negative.   Endocrine: Negative.   Genitourinary:       Bladder control  Musculoskeletal: Positive for gait problem.  Skin: Negative.   Allergic/Immunologic: Negative.   Neurological: Positive for weakness.  Hematological: Bruises/bleeds easily.       Eliquis  Psychiatric/Behavioral: Negative.   All other systems reviewed and are negative.      Objective:   Physical Exam  Constitutional: He appears well-developed and well-nourished. He appears lethargic.  HENT:  Head: Normocephalic and atraumatic.  Eyes: EOM are normal.  Mild blepharitis  Pulmonary/Chest: Effort normal and breath sounds normal. No stridor. No respiratory distress. He has no wheezes.  Musculoskeletal: He exhibits edema.  Neurological: He appears lethargic. A sensory deficit is  present.  0/5 strength in the left deltoid, bicep, tricep, grip Left upper extremity tone is flaccid  Left lower extremity strength is 3- hip knee extensor synergy 0 at the hip flexors 0 at the ankle dorsiflexor plantar flexors 0 at the hamstring.  Lower extremity tone is reduced no evidence of clonus at the ankle.  Nonambulatory  Sensation no withdrawal to pinch in the left upper extremity  Patient has severe dysarthria he arouses to voice but then falls asleep quickly.  Patient is able to remember my name and that I saw him at the rehab hospital.          Assessment & Plan:  #1.  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  Patient's wife is planning to bring him home after 1 more month in the skilled nursing facility.  She plans to hire assistance.  We discussed getting home health PT OT speech aide and RN involved as well.  Physical medicine and rehab follow-up in 6 weeks  2.  Lethargy likely multifactorial certainly has a large underlying CVA but also onset of increased lethargy coincided with increase in Lyrica dosage.  Will reduce from 150 mg twice daily to 75 mg twice daily If reduction in Lyrica is not helpful with his lethargy he may benefit from a trial of methylphenidate.  Long discussion with patient's wife regarding current status as well as his  medications and recent hospitalization Over half of the 25 min visit was spent counseling and coordinating care.

## 2017-08-22 NOTE — Patient Instructions (Signed)
Please try Nectar thick liquids to see if this helps with cough

## 2017-08-29 ENCOUNTER — Other Ambulatory Visit: Payer: Self-pay

## 2017-08-29 NOTE — Patient Outreach (Signed)
Telephone outreach to patient to obtain mRS was successfully completed. mRS = 5 

## 2017-08-30 DIAGNOSIS — J40 Bronchitis, not specified as acute or chronic: Secondary | ICD-10-CM | POA: Diagnosis not present

## 2017-08-30 DIAGNOSIS — E039 Hypothyroidism, unspecified: Secondary | ICD-10-CM | POA: Diagnosis not present

## 2017-08-30 DIAGNOSIS — I69354 Hemiplegia and hemiparesis following cerebral infarction affecting left non-dominant side: Secondary | ICD-10-CM | POA: Diagnosis not present

## 2017-09-03 DIAGNOSIS — J40 Bronchitis, not specified as acute or chronic: Secondary | ICD-10-CM | POA: Diagnosis not present

## 2017-09-10 DIAGNOSIS — I69354 Hemiplegia and hemiparesis following cerebral infarction affecting left non-dominant side: Secondary | ICD-10-CM | POA: Diagnosis not present

## 2017-09-10 DIAGNOSIS — I69391 Dysphagia following cerebral infarction: Secondary | ICD-10-CM | POA: Diagnosis not present

## 2017-09-10 DIAGNOSIS — I480 Paroxysmal atrial fibrillation: Secondary | ICD-10-CM | POA: Diagnosis not present

## 2017-09-10 DIAGNOSIS — J189 Pneumonia, unspecified organism: Secondary | ICD-10-CM | POA: Diagnosis not present

## 2017-09-23 ENCOUNTER — Emergency Department (HOSPITAL_COMMUNITY): Payer: Medicare Other

## 2017-09-23 ENCOUNTER — Inpatient Hospital Stay (HOSPITAL_COMMUNITY)
Admission: EM | Admit: 2017-09-23 | Discharge: 2017-09-26 | DRG: 193 | Disposition: A | Discharge status: Skilled Nursing Facility | Payer: Medicare Other | Attending: Family Medicine | Admitting: Family Medicine

## 2017-09-23 ENCOUNTER — Inpatient Hospital Stay (HOSPITAL_COMMUNITY): Payer: Medicare Other

## 2017-09-23 ENCOUNTER — Other Ambulatory Visit: Payer: Self-pay

## 2017-09-23 ENCOUNTER — Encounter (HOSPITAL_COMMUNITY): Payer: Self-pay | Admitting: Emergency Medicine

## 2017-09-23 DIAGNOSIS — I69391 Dysphagia following cerebral infarction: Secondary | ICD-10-CM | POA: Diagnosis not present

## 2017-09-23 DIAGNOSIS — N183 Chronic kidney disease, stage 3 unspecified: Secondary | ICD-10-CM | POA: Diagnosis present

## 2017-09-23 DIAGNOSIS — I69354 Hemiplegia and hemiparesis following cerebral infarction affecting left non-dominant side: Secondary | ICD-10-CM | POA: Diagnosis not present

## 2017-09-23 DIAGNOSIS — I10 Essential (primary) hypertension: Secondary | ICD-10-CM

## 2017-09-23 DIAGNOSIS — I69315 Cognitive social or emotional deficit following cerebral infarction: Secondary | ICD-10-CM | POA: Diagnosis not present

## 2017-09-23 DIAGNOSIS — E860 Dehydration: Secondary | ICD-10-CM | POA: Diagnosis present

## 2017-09-23 DIAGNOSIS — Z8551 Personal history of malignant neoplasm of bladder: Secondary | ICD-10-CM | POA: Diagnosis not present

## 2017-09-23 DIAGNOSIS — E785 Hyperlipidemia, unspecified: Secondary | ICD-10-CM | POA: Diagnosis not present

## 2017-09-23 DIAGNOSIS — R4182 Altered mental status, unspecified: Secondary | ICD-10-CM

## 2017-09-23 DIAGNOSIS — N390 Urinary tract infection, site not specified: Secondary | ICD-10-CM | POA: Diagnosis present

## 2017-09-23 DIAGNOSIS — E039 Hypothyroidism, unspecified: Secondary | ICD-10-CM | POA: Diagnosis present

## 2017-09-23 DIAGNOSIS — Z87891 Personal history of nicotine dependence: Secondary | ICD-10-CM

## 2017-09-23 DIAGNOSIS — G9341 Metabolic encephalopathy: Secondary | ICD-10-CM | POA: Diagnosis present

## 2017-09-23 DIAGNOSIS — F0390 Unspecified dementia without behavioral disturbance: Secondary | ICD-10-CM | POA: Diagnosis present

## 2017-09-23 DIAGNOSIS — Z7901 Long term (current) use of anticoagulants: Secondary | ICD-10-CM | POA: Diagnosis not present

## 2017-09-23 DIAGNOSIS — J9601 Acute respiratory failure with hypoxia: Secondary | ICD-10-CM | POA: Diagnosis not present

## 2017-09-23 DIAGNOSIS — I480 Paroxysmal atrial fibrillation: Secondary | ICD-10-CM | POA: Diagnosis not present

## 2017-09-23 DIAGNOSIS — I959 Hypotension, unspecified: Secondary | ICD-10-CM | POA: Diagnosis not present

## 2017-09-23 DIAGNOSIS — R05 Cough: Secondary | ICD-10-CM | POA: Diagnosis not present

## 2017-09-23 DIAGNOSIS — N3000 Acute cystitis without hematuria: Secondary | ICD-10-CM | POA: Diagnosis not present

## 2017-09-23 DIAGNOSIS — I69822 Dysarthria following other cerebrovascular disease: Secondary | ICD-10-CM | POA: Diagnosis not present

## 2017-09-23 DIAGNOSIS — J189 Pneumonia, unspecified organism: Principal | ICD-10-CM

## 2017-09-23 DIAGNOSIS — R293 Abnormal posture: Secondary | ICD-10-CM | POA: Diagnosis not present

## 2017-09-23 DIAGNOSIS — R278 Other lack of coordination: Secondary | ICD-10-CM | POA: Diagnosis not present

## 2017-09-23 DIAGNOSIS — H353290 Exudative age-related macular degeneration, unspecified eye, stage unspecified: Secondary | ICD-10-CM | POA: Diagnosis not present

## 2017-09-23 DIAGNOSIS — Y95 Nosocomial condition: Secondary | ICD-10-CM | POA: Diagnosis present

## 2017-09-23 DIAGNOSIS — R1312 Dysphagia, oropharyngeal phase: Secondary | ICD-10-CM

## 2017-09-23 DIAGNOSIS — I63511 Cerebral infarction due to unspecified occlusion or stenosis of right middle cerebral artery: Secondary | ICD-10-CM | POA: Diagnosis present

## 2017-09-23 DIAGNOSIS — Z9981 Dependence on supplemental oxygen: Secondary | ICD-10-CM | POA: Diagnosis not present

## 2017-09-23 DIAGNOSIS — R339 Retention of urine, unspecified: Secondary | ICD-10-CM | POA: Diagnosis not present

## 2017-09-23 DIAGNOSIS — R270 Ataxia, unspecified: Secondary | ICD-10-CM | POA: Diagnosis not present

## 2017-09-23 DIAGNOSIS — K5900 Constipation, unspecified: Secondary | ICD-10-CM | POA: Diagnosis not present

## 2017-09-23 DIAGNOSIS — B9620 Unspecified Escherichia coli [E. coli] as the cause of diseases classified elsewhere: Secondary | ICD-10-CM | POA: Diagnosis present

## 2017-09-23 DIAGNOSIS — Z79899 Other long term (current) drug therapy: Secondary | ICD-10-CM | POA: Diagnosis not present

## 2017-09-23 DIAGNOSIS — I129 Hypertensive chronic kidney disease with stage 1 through stage 4 chronic kidney disease, or unspecified chronic kidney disease: Secondary | ICD-10-CM | POA: Diagnosis present

## 2017-09-23 DIAGNOSIS — H04129 Dry eye syndrome of unspecified lacrimal gland: Secondary | ICD-10-CM | POA: Diagnosis not present

## 2017-09-23 DIAGNOSIS — N401 Enlarged prostate with lower urinary tract symptoms: Secondary | ICD-10-CM | POA: Diagnosis not present

## 2017-09-23 DIAGNOSIS — I69321 Dysphasia following cerebral infarction: Secondary | ICD-10-CM | POA: Diagnosis not present

## 2017-09-23 DIAGNOSIS — M6281 Muscle weakness (generalized): Secondary | ICD-10-CM | POA: Diagnosis not present

## 2017-09-23 DIAGNOSIS — I4901 Ventricular fibrillation: Secondary | ICD-10-CM | POA: Diagnosis not present

## 2017-09-23 DIAGNOSIS — R402441 Other coma, without documented Glasgow coma scale score, or with partial score reported, in the field [EMT or ambulance]: Secondary | ICD-10-CM | POA: Diagnosis not present

## 2017-09-23 DIAGNOSIS — H409 Unspecified glaucoma: Secondary | ICD-10-CM | POA: Diagnosis not present

## 2017-09-23 DIAGNOSIS — R609 Edema, unspecified: Secondary | ICD-10-CM | POA: Diagnosis not present

## 2017-09-23 DIAGNOSIS — R0902 Hypoxemia: Secondary | ICD-10-CM | POA: Diagnosis not present

## 2017-09-23 DIAGNOSIS — Z950 Presence of cardiac pacemaker: Secondary | ICD-10-CM | POA: Diagnosis not present

## 2017-09-23 LAB — URINALYSIS, ROUTINE W REFLEX MICROSCOPIC
Bilirubin Urine: NEGATIVE
GLUCOSE, UA: NEGATIVE mg/dL
Ketones, ur: NEGATIVE mg/dL
Nitrite: NEGATIVE
PH: 5 (ref 5.0–8.0)
PROTEIN: 100 mg/dL — AB
RBC / HPF: >50 RBC/hpf — ABNORMAL HIGH (ref 0–5)
Specific Gravity, Urine: 1.015 (ref 1.005–1.030)
WBC, UA: >50 WBC/hpf — ABNORMAL HIGH (ref 0–5)

## 2017-09-23 LAB — CBC WITH DIFFERENTIAL/PLATELET
Abs Immature Granulocytes: 0.1 10*3/uL (ref 0.0–0.1)
BASOS ABS: 0 10*3/uL (ref 0.0–0.1)
Basophils Relative: 1 %
EOS PCT: 0 %
Eosinophils Absolute: 0 10*3/uL (ref 0.0–0.7)
HCT: 39.8 % (ref 39.0–52.0)
Hemoglobin: 12.6 g/dL — ABNORMAL LOW (ref 13.0–17.0)
Immature Granulocytes: 1 %
LYMPHS ABS: 1.2 10*3/uL (ref 0.7–4.0)
LYMPHS PCT: 14 %
MCH: 27.5 pg (ref 26.0–34.0)
MCHC: 31.7 g/dL (ref 30.0–36.0)
MCV: 86.7 fL (ref 78.0–100.0)
Monocytes Absolute: 0.7 10*3/uL (ref 0.1–1.0)
Monocytes Relative: 7 %
NEUTROS ABS: 6.8 10*3/uL (ref 1.7–7.7)
NEUTROS PCT: 77 %
Platelets: 182 10*3/uL (ref 150–400)
RBC: 4.59 MIL/uL (ref 4.22–5.81)
RDW: 15.6 % — AB (ref 11.5–15.5)
WBC: 8.8 10*3/uL (ref 4.0–10.5)

## 2017-09-23 LAB — BASIC METABOLIC PANEL
ANION GAP: 11 (ref 5–15)
BUN: 15 mg/dL (ref 6–20)
CO2: 22 mmol/L (ref 22–32)
Calcium: 7.8 mg/dL — ABNORMAL LOW (ref 8.9–10.3)
Chloride: 109 mmol/L (ref 101–111)
Creatinine, Ser: 1.36 mg/dL — ABNORMAL HIGH (ref 0.61–1.24)
GFR calc Af Amer: 53 mL/min — ABNORMAL LOW (ref >60)
GFR, EST NON AFRICAN AMERICAN: 46 mL/min — AB (ref >60)
GLUCOSE: 104 mg/dL — AB (ref 65–99)
POTASSIUM: 3 mmol/L — AB (ref 3.5–5.1)
Sodium: 142 mmol/L (ref 135–145)

## 2017-09-23 LAB — LACTIC ACID, PLASMA
LACTIC ACID, VENOUS: 1.5 mmol/L (ref 0.5–1.9)
Lactic Acid, Venous: 2 mmol/L (ref 0.5–1.9)

## 2017-09-23 MED ORDER — LEVOTHYROXINE SODIUM 25 MCG PO TABS
125.0000 ug | ORAL_TABLET | Freq: Every day | ORAL | Status: DC
  Administered 2017-09-24 – 2017-09-26 (×3): 125 ug via ORAL
  Filled 2017-09-23 (×3): qty 1

## 2017-09-23 MED ORDER — CARBOXYMETHYLCELLULOSE SODIUM 0.5 % OP SOLN: 1.0000 [drp] | Freq: Four times a day (QID) | OPHTHALMIC | Status: DC | PRN

## 2017-09-23 MED ORDER — POTASSIUM CHLORIDE CRYS ER 20 MEQ PO TBCR
40.0000 meq | EXTENDED_RELEASE_TABLET | Freq: Once | ORAL | Status: AC
  Administered 2017-09-23: 40 meq via ORAL
  Filled 2017-09-23: qty 2

## 2017-09-23 MED ORDER — DEXTROMETHORPHAN-GUAIFENESIN 10-100 MG/5ML PO SYRP: 15.0000 mL | ORAL_SOLUTION | ORAL | Status: DC | PRN

## 2017-09-23 MED ORDER — CEFEPIME HCL 2 G IJ SOLR
2.0000 g | Freq: Once | INTRAMUSCULAR | Status: AC
  Administered 2017-09-23: 2 g via INTRAVENOUS
  Filled 2017-09-23: qty 2

## 2017-09-23 MED ORDER — IPRATROPIUM-ALBUTEROL 0.5-2.5 (3) MG/3ML IN SOLN
3.0000 mL | Freq: Once | RESPIRATORY_TRACT | Status: AC
  Administered 2017-09-23: 3 mL via RESPIRATORY_TRACT
  Filled 2017-09-23: qty 3

## 2017-09-23 MED ORDER — ALBUTEROL SULFATE (2.5 MG/3ML) 0.083% IN NEBU
2.5000 mg | INHALATION_SOLUTION | Freq: Four times a day (QID) | RESPIRATORY_TRACT | Status: DC
  Administered 2017-09-23: 2.5 mg via RESPIRATORY_TRACT
  Filled 2017-09-23 (×2): qty 3

## 2017-09-23 MED ORDER — MECLIZINE HCL 12.5 MG PO TABS: 12.5000 mg | ORAL_TABLET | Freq: Every day | ORAL | Status: DC | PRN

## 2017-09-23 MED ORDER — SODIUM CHLORIDE 0.9 % IV SOLN
INTRAVENOUS | Status: AC
  Administered 2017-09-23: 23:00:00 via INTRAVENOUS

## 2017-09-23 MED ORDER — BETHANECHOL CHLORIDE 25 MG PO TABS: 25.0000 mg | ORAL_TABLET | Freq: Three times a day (TID) | ORAL | Status: DC

## 2017-09-23 MED ORDER — ALBUTEROL SULFATE (2.5 MG/3ML) 0.083% IN NEBU: 2.5000 mg | INHALATION_SOLUTION | Freq: Four times a day (QID) | RESPIRATORY_TRACT | Status: DC | PRN

## 2017-09-23 MED ORDER — BENZONATATE 100 MG PO CAPS: 200.0000 mg | ORAL_CAPSULE | Freq: Three times a day (TID) | ORAL | Status: DC | PRN

## 2017-09-23 MED ORDER — SODIUM CHLORIDE 0.9 % IV SOLN
Freq: Once | INTRAVENOUS | Status: AC
  Administered 2017-09-23: 11:00:00 via INTRAVENOUS

## 2017-09-23 MED ORDER — APIXABAN 5 MG PO TABS
5.0000 mg | ORAL_TABLET | Freq: Two times a day (BID) | ORAL | Status: DC
  Administered 2017-09-23 – 2017-09-26 (×6): 5 mg via ORAL
  Filled 2017-09-23 (×6): qty 1

## 2017-09-23 MED ORDER — ACETAMINOPHEN 500 MG PO TABS: 500.0000 mg | ORAL_TABLET | ORAL | Status: DC | PRN

## 2017-09-23 MED ORDER — VANCOMYCIN HCL IN DEXTROSE 750-5 MG/150ML-% IV SOLN
750.0000 mg | Freq: Two times a day (BID) | INTRAVENOUS | Status: DC
  Administered 2017-09-24 – 2017-09-25 (×3): 750 mg via INTRAVENOUS
  Filled 2017-09-23 (×3): qty 150

## 2017-09-23 MED ORDER — PREGABALIN 75 MG PO CAPS
75.0000 mg | ORAL_CAPSULE | Freq: Two times a day (BID) | ORAL | Status: DC
  Administered 2017-09-23 – 2017-09-26 (×6): 75 mg via ORAL
  Filled 2017-09-23 (×6): qty 1

## 2017-09-23 MED ORDER — SENNOSIDES-DOCUSATE SODIUM 8.6-50 MG PO TABS
2.0000 | ORAL_TABLET | Freq: Two times a day (BID) | ORAL | Status: DC
  Administered 2017-09-23 – 2017-09-26 (×6): 2 via ORAL
  Filled 2017-09-23 (×6): qty 2

## 2017-09-23 MED ORDER — METHYLPREDNISOLONE SODIUM SUCC 125 MG IJ SOLR
80.0000 mg | Freq: Once | INTRAMUSCULAR | Status: AC
  Administered 2017-09-23: 80 mg via INTRAVENOUS
  Filled 2017-09-23: qty 2

## 2017-09-23 MED ORDER — BETHANECHOL CHLORIDE 25 MG PO TABS
25.0000 mg | ORAL_TABLET | ORAL | Status: DC
  Administered 2017-09-23 – 2017-09-26 (×8): 25 mg via ORAL
  Filled 2017-09-23 (×8): qty 1

## 2017-09-23 MED ORDER — SODIUM CHLORIDE 0.9 % IV BOLUS
500.0000 mL | Freq: Once | INTRAVENOUS | Status: AC
  Administered 2017-09-23: 500 mL via INTRAVENOUS

## 2017-09-23 MED ORDER — SODIUM CHLORIDE 0.9 % IV SOLN
2.0000 g | INTRAVENOUS | Status: DC
  Administered 2017-09-24 – 2017-09-25 (×2): 2 g via INTRAVENOUS
  Filled 2017-09-23 (×3): qty 2

## 2017-09-23 MED ORDER — CARBOXYMETHYLCELLULOSE SODIUM 0.5 % OP SOLN: 1.0000 [drp] | Freq: Four times a day (QID) | OPHTHALMIC | Status: DC

## 2017-09-23 MED ORDER — LATANOPROST 0.005 % OP SOLN
1.0000 [drp] | Freq: Every day | OPHTHALMIC | Status: DC
  Administered 2017-09-23 – 2017-09-25 (×3): 1 [drp] via OPHTHALMIC
  Filled 2017-09-23: qty 2.5

## 2017-09-23 MED ORDER — TAMSULOSIN HCL 0.4 MG PO CAPS
0.4000 mg | ORAL_CAPSULE | Freq: Every day | ORAL | Status: DC
  Administered 2017-09-23 – 2017-09-25 (×3): 0.4 mg via ORAL
  Filled 2017-09-23 (×3): qty 1

## 2017-09-23 MED ORDER — SODIUM CHLORIDE 0.9 % IV SOLN
2000.0000 mg | Freq: Once | INTRAVENOUS | Status: AC
  Administered 2017-09-23: 2000 mg via INTRAVENOUS
  Filled 2017-09-23: qty 2000

## 2017-09-23 MED ORDER — TIMOLOL MALEATE 0.5 % OP SOLN
1.0000 [drp] | Freq: Every day | OPHTHALMIC | Status: DC
  Administered 2017-09-24 – 2017-09-26 (×3): 1 [drp] via OPHTHALMIC
  Filled 2017-09-23: qty 5

## 2017-09-23 MED ORDER — ATORVASTATIN CALCIUM 40 MG PO TABS
40.0000 mg | ORAL_TABLET | Freq: Every day | ORAL | Status: DC
  Administered 2017-09-23 – 2017-09-25 (×3): 40 mg via ORAL
  Filled 2017-09-23 (×3): qty 1

## 2017-09-23 NOTE — ED Notes (Signed)
Patient transported to CT 

## 2017-09-23 NOTE — Progress Notes (Signed)
Pharmacy Antibiotic Note  Derek Hays is a 84 y.o. male admitted on 09/23/2017 with AMS. Pharmacy has been consulted for vancomycin and cefepime dosing for PNA.  SCr 1.36, CrCL 44 ml/min, afebrile, WBC WNL, LA down to 1.5.   Plan: Vanc 2gm IV x 1, then 750mg  IV Q12H Cefepime 2gm IV Q24H Monitor renal fxn, clinical progress, vanc trough at Css F/u KCL supplementation   Height: 6' (182.9 cm) Weight: 204 lb (92.5 kg) IBW/kg (Calculated) : 77.6  Temp (24hrs), Avg:98 F (36.7 C), Min:98 F (36.7 C), Max:98 F (36.7 C)  Recent Labs  Lab 09/23/17 1001 09/23/17 1138 09/23/17 1224  WBC 8.8  --   --   CREATININE  --  1.36*  --   LATICACIDVEN  --  2.0* 1.5    Estimated Creatinine Clearance: 44.4 mL/min (A) (by C-G formula based on SCr of 1.36 mg/dL (H)).    No Known Allergies   Vanc 6/10 >> Cefepime 6/10 >>   Derek Hays D. Mina Marble, PharmD, BCPS, BCCCP Pager:  [phone] 09/23/2017, 3:22 PM

## 2017-09-23 NOTE — ED Notes (Signed)
ED Provider at bedside. 

## 2017-09-23 NOTE — ED Triage Notes (Signed)
Per EMS: Pt from [facility] with c/o altered level of consciousness that began last pm and became more altered this morning.  At baseline pt does not carry on normal conversations per staff.  Pt not typically on oxygen at baseline.  Also noted pt has left sided weakness from a previous stroke.  EMS reported pt recently treated for PNA.

## 2017-09-23 NOTE — H&P (Signed)
History and Physical    Derek Hays OHY:[id number] DOB: [birth year] DOA: 09/23/2017  PCP: Patient, No Pcp Per Consultants:  None Patient coming from: [facility]   Chief Complaint: Altered mental status  HPI: Derek Hays is a 84 y.o. male with medical history significant for HTN, PAF, bladder cancer, and recent embolic R MCA CVA with residual L hemiparesis who was brought to the ED today by EMS after facility reported pt had decreased responsiveness as well as a drop in his pulse ox to 72%. By the time he got to the ED he was "perfectly coherent" according to his daughter. He knows where he is, what happened. Dtr states he doesn't like the water at the facility so he avoids drinking, and is likely dehydrated. He was admitted in late April for a UTI and pneumonia associated with AMS. He does have known dysphagia following his CVA and had FEES during his hospitalization at the time of his stroke that indicated oropharyngeal dysphagia, with moderate aspiration risk. He was supposed to be on a thickened liquid diet but per the daughter, his liquids are not being thickened at the rehab where he's been. He was scheduled for a repeat FEES at the [facility] tomorrow. His daughter who provides most of the history states that he has seemed well up until the episode today, with no recent changes in his baseline.   ED Course: Upon arrival pt was alert and oriented, afebrile, with HR in the 60s (mostly paced), BP 126/88, RR 25 (persistently elevated in the ED, up to 28), 97% on RA. CXR showed persistent but improved infiltrates in R base. CT head showed old CVAs, nothing acute. U/A indicated infection.   Review of Systems: Limited due to pt's AMS but did endorse dysuria.   Past Medical History:  Diagnosis Date  . Atrial fibrillation ([place])   . Bladder cancer ([employer]) 1984  . Hypertension   . Stroke [facility])     Past Surgical History:  Procedure Laterality Date  . IR CT HEAD LTD  05/27/2017  . IR  PERCUTANEOUS ART THROMBECTOMY/INFUSION INTRACRANIAL INC DIAG ANGIO  05/27/2017  . PACEMAKER INSERTION    . RADIOLOGY WITH ANESTHESIA N/A 05/27/2017   Procedure: RADIOLOGY WITH ANESTHESIA;  Surgeon: Luanne Bras, MD;  Location: [place];  Service: Radiology;  Laterality: N/A;    Social History   Socioeconomic History  . Marital status: Married    Spouse name: Not on file  . Number of children: Not on file  . Years of education: Not on file  . Highest education level: Not on file  Occupational History    Employer: [employer]  Social Needs  . Financial resource strain: Not on file  . Food insecurity:    Worry: Not on file    Inability: Not on file  . Transportation needs:    Medical: Not on file    Non-medical: Not on file  Tobacco Use  . Smoking status: Former Smoker    Packs/day: 1.50    Years: 25.00    Pack years: 37.50    Types: Cigarettes    Last attempt to quit: 06/12/1982    Years since quitting: 35.3  . Smokeless tobacco: Never Used  . Tobacco comment: pt states he quit when dx with bladder ca  Substance and Sexual Activity  . Alcohol use: Not Currently  . Drug use: No  . Sexual activity: Not on file  Lifestyle  . Physical activity:    Days per week: Not  on file    Minutes per session: Not on file  . Stress: To some extent  Relationships  . Social connections:    Talks on phone: More than three times a week    Gets together: Twice a week    Attends religious service: Not on file    Active member of club or organization: Not on file    Attends meetings of clubs or organizations: Never    Relationship status: Not on file  . Intimate partner violence:    Fear of current or ex partner: No    Emotionally abused: No    Physically abused: No    Forced sexual activity: No  Other Topics Concern  . Not on file  Social History Narrative  . Not on file    No Known Allergies  Family History  Problem Relation Age of Onset  . Emphysema Father     Prior to  Admission medications   Medication Sig Start Date End Date Taking? Authorizing Provider  acetaminophen (TYLENOL) 500 MG tablet Take 500-1,000 mg by mouth every 4 (four) hours as needed for headache (pain).   Yes [provider]  albuterol (PROVENTIL) (2.5 MG/3ML) 0.083% nebulizer solution Take 3 mLs (2.5 mg total) by nebulization every 6 (six) hours. Patient taking differently: Take 2.5 mg by nebulization every 6 (six) hours as needed for wheezing or shortness of breath.  08/13/17  Yes Rory Percy, DO  albuterol (PROVENTIL) (2.5 MG/3ML) 0.083% nebulizer solution Take 2.5 mg by nebulization 2 (two) times daily. Duration 7 days end date 09-15-17   Yes [provider]  amLODipine (NORVASC) 10 MG tablet Take 1 tablet (10 mg total) by mouth daily. 08/14/17  Yes Rory Percy, DO  apixaban (ELIQUIS) 5 MG TABS tablet Take 1 tablet (5 mg total) by mouth 2 (two) times daily. 06/19/17  Yes Angiulli, Lavon Paganini, PA-C  atorvastatin (LIPITOR) 40 MG tablet Take 1 tablet (40 mg total) by mouth daily at 6 PM. 08/13/17  Yes Rumball, Bryson Ha, DO  benzonatate (TESSALON) 100 MG capsule Take 200 mg by mouth 3 (three) times daily as needed for cough.   Yes [provider]  bethanechol (URECHOLINE) 25 MG tablet Take 1 tablet (25 mg total) by mouth 3 (three) times daily. Patient taking differently: Take 25 mg by mouth 3 (three) times daily. 7:30am, 5pm, 9pm 06/19/17  Yes Angiulli, Lavon Paganini, PA-C  carboxymethylcellulose (REFRESH TEARS) 0.5 % SOLN Place 1 drop into both eyes every 6 (six) hours as needed (dry eyes).   Yes [provider]  Carboxymethylcellulose Sodium (REFRESH TEARS OP) Place 1 drop into the right eye 4 (four) times daily.   Yes [provider]  Dextromethorphan-guaiFENesin (GERI-TUSSIN DM) 10-100 MG/5ML liquid Take 15 mLs by mouth every 4 (four) hours as needed (cough/congestion).   Yes [provider]  latanoprost (XALATAN) 0.005 % ophthalmic solution Place 1  drop into both eyes at bedtime.   Yes [provider]  levothyroxine (SYNTHROID, LEVOTHROID) 125 MCG tablet Take 125 mcg by mouth daily.    Yes [provider]  meclizine (ANTIVERT) 12.5 MG tablet Take 12.5 mg by mouth daily as needed for nausea.   Yes [provider]  pregabalin (LYRICA) 75 MG capsule Take 75 mg by mouth 2 (two) times daily.   Yes [provider]  senna-docusate (SENNA-PLUS) 8.6-50 MG tablet Take 2 tablets by mouth 2 (two) times daily.   Yes [provider]  tamsulosin (FLOMAX) 0.4 MG CAPS capsule Take  1 capsule (0.4 mg total) by mouth daily after supper. Patient taking differently: Take 0.4 mg by mouth at bedtime.  06/19/17  Yes Angiulli, Lavon Paganini, PA-C  timolol (TIMOPTIC) 0.5 % ophthalmic solution Place 1 drop into the left eye daily.   Yes [provider]  pregabalin (LYRICA) 150 MG capsule Take 1 capsule (150 mg total) by mouth 2 (two) times daily. Patient not taking: Reported on 09/23/2017 08/05/17   Venancio Poisson, NP    Physical Exam: Vitals:   09/23/17 1508 09/23/17 1530 09/23/17 1600 09/23/17 1630  BP:  (!) 101/58 112/67 109/68  Pulse:  (!) 59 60 60  Resp:  (!) 21 (!) 22 19  Temp:      TempSrc:      SpO2:  98% 98% 100%  Weight: 92.5 kg (204 lb)     Height: 6' (1.829 m)        . General: Elderly male, difficult to arouse but when awake is appropriate and oriented Appears calm and comfortable and is NAD . Eyes:  1-70mm pupils bilaterally, PERRL, EOMI, nL iris, some erythema of L eyelid . ENT:  grossly normal hearing, lips & tongue, mmm; appropriate dentition . Neck:  no LAD, masses or thyromegaly; no carotid bruits . Cardiovascular:  RRR, no m/r/g. No LE edema.  [name] Respiratory: CTA bilaterally with no wheezes/rales/rhonchi.  Normal respiratory effort. . Abdomen:  soft, mild suprapubic tenderness, o/w nontender, ND, NABS . Back: no CVAT . Skin:  no rash or induration seen on limited exam . Musculoskeletal:   grossly normal tone BUE/BLE, good ROM, no bony abnormality . Lower extremity:  No LE edema.  Limited foot exam with no ulcerations.  2+ distal pulses. [name] Psychiatric:  grossly normal mood and affect when awake, speech hesitant, appropriate, oriented x 3 . Neurologic:  CN 2-12 grossly intact, L hemiparesis, sensation intact    Radiological Exams on Admission: Dg Chest Port 1 View  Result Date: 09/23/2017 CLINICAL DATA:  Productive cough for 2 days EXAM: PORTABLE CHEST 1 VIEW COMPARISON:  08/11/2017 FINDINGS: Cardiac shadow is stable. Pacing device is again seen and stable. Aortic calcifications are noted. The lungs are well aerated bilaterally some persistent but mildly improved infiltrative changes noted in the right base. No bony abnormality is seen. IMPRESSION: Persistent but improved infiltrative changes in the right base. Electronically Signed   By: Inez Catalina M.D.   On: 09/23/2017 10:56    EKG: Independently reviewed.  NSR with rate 88; QTc 607, RBBB, no evidence of acute ischemia   Labs on Admission: I have personally reviewed the available labs and imaging studies at the time of the admission.  Pertinent labs:  Lactate 2.0 -> 1.5 U/A positive for large leuks, neg nitrite, large blood WBC 8.8 Hgb 12.6 K 3.0 CO2 22 BUN 15 Creat 1.36 (baseline 1.1-1.5)   Assessment/Plan Principal Problem:   Altered mental status Active Problems:   Right middle cerebral artery stroke (HCC)   Stage 3 chronic kidney disease (HCC)   PAF (paroxysmal atrial fibrillation) (HCC)   Benign essential HTN   Dysphagia, post-stroke   HCAP (healthcare-associated pneumonia)   Urinary tract infection   Acute respiratory failure with hypoxia (HCC)   Oropharyngeal dysphagia  AMS: Likely due to infection; also earlier was likely due to transient hypoxia. Now resolved. No evidence of acute CVA. -cont to monitor -elevate HOB -frequent neuro checks  HCAP, possible: unclear if this is recurrent PNA,  unresolved PNA, or resolved with persistent CXR changes. He  has been tachypneic and experienced an episode of hypoxia, however. He is also at risk for aspiration PNA since his stroke and apparently has not been getting thickened liquids at inpatient rehab.  -empiric treatment with vanc and cefepime -Strep urine Ag ordered -f/u sputum and blood cultures -albuterol nebs prn -pt got IV methylpred in ED but he does not have dx of COPD, has no wheezing, so will not continue steroids  UTI, bacterial -cont abx as above -f/u UCx results, tailor abx as appropriate  Acute hypoxic episode, resolved. Possibly due to aspiration event -supplemental O2 prn -albuterol nebs q6h prn -antibiotics as above  Recent embolic CVA with residual L hemiparesis, dysphagia -cont Eliquis -FEES ordered -elevate HOB, aspiration precautions -thickened liquids -PT/OT eval and treat -will need continued inpt rehab at discharge  PAF, with PPM -cont Eliquis  HTN, currently BP on low side -hold imdur  -dtr thinks pt was on beta-blocker, not sure why he is not on this now; will need to look into this further  CKD, creat at baseline -renally dose all meds -deescalate abx when able, esp vanc -avoid nephrotoxins -daily BMP      DVT prophylaxis:  On Eliquis, SCDs Code Status:  Full - confirmed with patient/family Family CommunicationLayten, [place] [phone]  [phone]    Disposition Plan: Inpt rehab once clinically improved Consults called: none  Admission status: Admit - It is my clinical opinion that admission to INPATIENT is reasonable and necessary because of the expectation that this patient will require hospital care that crosses at least 2 midnights to treat this condition based on the medical complexity of the problems presented.  Given the aforementioned information, the predictability of an adverse outcome is felt to be significant.     Tiffany Kocher MD [facility]  If note is complete, please contact covering daytime or nighttime physician. [email or web address] Password [facility]  09/23/2017, 4:57 PM

## 2017-09-23 NOTE — ED Provider Notes (Signed)
[place] EMERGENCY DEPARTMENT Provider Note   CSN: [id number] Arrival date & time: 09/23/17  9735     History   Chief Complaint Chief Complaint  Patient presents with  . Altered Mental Status    HPI Derek Hays is a 84 y.o. male.  Level 5 caveat for mild dementia.  Patient presents with altered mental status and dyspnea since last night.  He has known left-sided weakness from a previous stroke.  He was recently treated for pneumonia.  He required nasal oxygen to boost his pulse ox was reported at 79%.     Past Medical History:  Diagnosis Date  . Atrial fibrillation ([place])   . Bladder cancer ([place]) 1984  . Hypertension   . Stroke [facility])     Patient Active Problem List   Diagnosis Date Noted  . Palliative care by specialist   . Sepsis due to urinary tract infection ([place])   . Pneumonia due to infectious organism   . Altered mental status 08/10/2017  . Acute blood loss anemia   . Stage 3 chronic kidney disease ([place])   . PAF (paroxysmal atrial fibrillation) ([place])   . Benign essential HTN   . Dysphagia, post-stroke   . 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 Stroke (cerebrum) ([place]) 05/27/2017    Past Surgical History:  Procedure Laterality Date  . IR CT HEAD LTD  05/27/2017  . IR PERCUTANEOUS ART THROMBECTOMY/INFUSION INTRACRANIAL INC DIAG ANGIO  05/27/2017  . PACEMAKER INSERTION    . RADIOLOGY WITH ANESTHESIA N/A 05/27/2017   Procedure: RADIOLOGY WITH ANESTHESIA;  Surgeon: Luanne Bras, MD;  Location: [facility];  Service: Radiology;  Laterality: N/A;        Home Medications    Prior to Admission medications   Medication Sig Start Date End Date Taking? Authorizing Provider  acetaminophen (TYLENOL) 500 MG tablet Take 500-1,000 mg by mouth every 4 (four) hours as needed for headache (pain).   Yes [provider]  albuterol (PROVENTIL) (2.5 MG/3ML) 0.083%  nebulizer solution Take 3 mLs (2.5 mg total) by nebulization every 6 (six) hours. Patient taking differently: Take 2.5 mg by nebulization every 6 (six) hours as needed for wheezing or shortness of breath.  08/13/17  Yes Rory Percy, DO  albuterol (PROVENTIL) (2.5 MG/3ML) 0.083% nebulizer solution Take 2.5 mg by nebulization 2 (two) times daily. Duration 7 days end date 09-15-17   Yes [provider]  amLODipine (NORVASC) 10 MG tablet Take 1 tablet (10 mg total) by mouth daily. 08/14/17  Yes Rory Percy, DO  apixaban (ELIQUIS) 5 MG TABS tablet Take 1 tablet (5 mg total) by mouth 2 (two) times daily. 06/19/17  Yes Angiulli, Lavon Paganini, PA-C  atorvastatin (LIPITOR) 40 MG tablet Take 1 tablet (40 mg total) by mouth daily at 6 PM. 08/13/17  Yes Rumball, Bryson Ha, DO  benzonatate (TESSALON) 100 MG capsule Take 200 mg by mouth 3 (three) times daily as needed for cough.   Yes [provider]  bethanechol (URECHOLINE) 25 MG tablet Take 1 tablet (25 mg total) by mouth 3 (three) times daily. Patient taking differently: Take 25 mg by mouth 3 (three) times daily. 7:30am, 5pm, 9pm 06/19/17  Yes Angiulli, Lavon Paganini, PA-C  carboxymethylcellulose (REFRESH TEARS) 0.5 % SOLN Place 1 drop into both eyes every 6 (six) hours as needed (dry eyes).   Yes [provider]  Carboxymethylcellulose Sodium (REFRESH TEARS OP) Place 1 drop into  the right eye 4 (four) times daily.   Yes [provider]  Dextromethorphan-guaiFENesin (GERI-TUSSIN DM) 10-100 MG/5ML liquid Take 15 mLs by mouth every 4 (four) hours as needed (cough/congestion).   Yes [provider]  latanoprost (XALATAN) 0.005 % ophthalmic solution Place 1 drop into both eyes at bedtime.   Yes [provider]  levothyroxine (SYNTHROID, LEVOTHROID) 125 MCG tablet Take 125 mcg by mouth daily.    Yes [provider]  meclizine (ANTIVERT) 12.5 MG tablet Take 12.5 mg by mouth daily as needed for nausea.   Yes [provider]  pregabalin (LYRICA) 75 MG capsule Take 75 mg by mouth 2 (two) times daily.   Yes [provider]  senna-docusate (SENNA-PLUS) 8.6-50 MG tablet Take 2 tablets by mouth 2 (two) times daily.   Yes [provider]  tamsulosin (FLOMAX) 0.4 MG CAPS capsule Take 1 capsule (0.4 mg total) by mouth daily after supper. Patient taking differently: Take 0.4 mg by mouth at bedtime.  06/19/17  Yes Angiulli, Lavon Paganini, PA-C  timolol (TIMOPTIC) 0.5 % ophthalmic solution Place 1 drop into the left eye daily.   Yes [provider]  pregabalin (LYRICA) 150 MG capsule Take 1 capsule (150 mg total) by mouth 2 (two) times daily. Patient not taking: Reported on 09/23/2017 08/05/17   Venancio Poisson, NP    Family History Family History  Problem Relation Age of Onset  . Emphysema Father     Social History Social History   Tobacco Use  . Smoking status: Former Smoker    Packs/day: 1.50    Years: 25.00    Pack years: 37.50    Types: Cigarettes    Last attempt to quit: 06/12/1982    Years since quitting: 35.3  . Smokeless tobacco: Never Used  . Tobacco comment: pt states he quit when dx with bladder ca  Substance Use Topics  . Alcohol use: Not Currently  . Drug use: No     Allergies   Patient has no known allergies.   Review of Systems Review of Systems  Unable to perform ROS: Dementia     Physical Exam Updated Vital Signs BP 122/69   Pulse (!) 59   Temp 98 F (36.7 C) (Oral)   Resp 15   SpO2 100%   Physical Exam  Constitutional:  Looks ill, tachypneic  HENT:  Head: Normocephalic and atraumatic.  Eyes: Conjunctivae are normal.  Neck: Neck supple.  Cardiovascular: Normal rate and regular rhythm.  Pulmonary/Chest: Effort normal and breath sounds normal.  Abdominal: Soft. Bowel sounds are normal.  Musculoskeletal:  Grossly normal  Neurological: He is alert.  Remote cva affecting left side  Skin: Skin is warm and dry.  Psychiatric:  Flat  affect  Nursing note and vitals reviewed.    ED Treatments / Results  Labs (all labs ordered are listed, but only abnormal results are displayed) Labs Reviewed  CBC WITH DIFFERENTIAL/PLATELET - Abnormal; Notable for the following components:      Result Value   Hemoglobin 12.6 (*)    RDW 15.6 (*)    All other components within normal limits  LACTIC ACID, PLASMA - Abnormal; Notable for the following components:   Lactic Acid, Venous 2.0 (*)    All other components within normal limits  URINALYSIS, ROUTINE W REFLEX MICROSCOPIC - Abnormal; Notable for the following components:   Color, Urine AMBER (*)    APPearance CLOUDY (*)    Hgb urine dipstick LARGE (*)  Protein, ur 100 (*)    Leukocytes, UA LARGE (*)    RBC / HPF >50 (*)    WBC, UA >50 (*)    Bacteria, UA MANY (*)    All other components within normal limits  BASIC METABOLIC PANEL - Abnormal; Notable for the following components:   Potassium 3.0 (*)    Glucose, Bld 104 (*)    Creatinine, Ser 1.36 (*)    Calcium 7.8 (*)    GFR calc non Af Amer 46 (*)    GFR calc Af Amer 53 (*)    All other components within normal limits  LACTIC ACID, PLASMA    EKG EKG Interpretation  Date/Time:  Monday September 23 2017 09:44:05 EDT Ventricular Rate:  88 PR Interval:    QRS Duration: 182 QT Interval:  501 QTC Calculation: 607 R Axis:   -102 Text Interpretation:  Atrial fibrillation Right bundle branch block Confirmed by Nat Christen [phone]) on 09/23/2017 12:57:16 PM   Radiology Dg Chest Port 1 View  Result Date: 09/23/2017 CLINICAL DATA:  Productive cough for 2 days EXAM: PORTABLE CHEST 1 VIEW COMPARISON:  08/11/2017 FINDINGS: Cardiac shadow is stable. Pacing device is again seen and stable. Aortic calcifications are noted. The lungs are well aerated bilaterally some persistent but mildly improved infiltrative changes noted in the right base. No bony abnormality is seen. IMPRESSION: Persistent but improved infiltrative changes in  the right base. Electronically Signed   By: Inez Catalina M.D.   On: 09/23/2017 10:56    Procedures Procedures (including critical care time)  Medications Ordered in ED Medications  sodium chloride 0.9 % bolus 500 mL (0 mLs Intravenous Stopped 09/23/17 1247)  ipratropium-albuterol (DUONEB) 0.5-2.5 (3) MG/3ML nebulizer solution 3 mL (3 mLs Nebulization Given 09/23/17 1058)  methylPREDNISolone sodium succinate (SOLU-MEDROL) 125 mg/2 mL injection 80 mg (80 mg Intravenous Given 09/23/17 1058)  0.9 %  sodium chloride infusion ( Intravenous New Bag/Given 09/23/17 1106)     Initial Impression / Assessment and Plan / ED Course  I have reviewed the triage vital signs and the nursing notes.  Pertinent labs & imaging results that were available during my care of the patient were reviewed by me and considered in my medical decision making (see chart for details).     Patient presents with altered mental status and tachypnea.  He required nasal oxygen supplementation.  Chest x-ray shows persistent but improved infiltrate in the right base.  Additionally, he has a urinary tract infection.  Will initiate antibiotics for HCAP.  UTI noted.  Admit to general medicine.  Final Clinical Impressions(s) / ED Diagnoses   Final diagnoses:  None    ED Discharge Orders    None       Nat Christen, MD 09/23/17 [id number]

## 2017-09-23 NOTE — ED Notes (Signed)
Attempted report x1. 

## 2017-09-24 ENCOUNTER — Inpatient Hospital Stay (HOSPITAL_COMMUNITY): Payer: Medicare Other

## 2017-09-24 ENCOUNTER — Other Ambulatory Visit: Payer: Self-pay

## 2017-09-24 LAB — CBC
HCT: 32.9 % — ABNORMAL LOW (ref 39.0–52.0)
Hemoglobin: 10.6 g/dL — ABNORMAL LOW (ref 13.0–17.0)
MCH: 27.2 pg (ref 26.0–34.0)
MCHC: 32.2 g/dL (ref 30.0–36.0)
MCV: 84.6 fL (ref 78.0–100.0)
Platelets: 159 10*3/uL (ref 150–400)
RBC: 3.89 MIL/uL — ABNORMAL LOW (ref 4.22–5.81)
RDW: 15.2 % (ref 11.5–15.5)
WBC: 7.5 10*3/uL (ref 4.0–10.5)

## 2017-09-24 LAB — BASIC METABOLIC PANEL
Anion gap: 8 (ref 5–15)
BUN: 21 mg/dL — ABNORMAL HIGH (ref 6–20)
CO2: 24 mmol/L (ref 22–32)
Calcium: 8.3 mg/dL — ABNORMAL LOW (ref 8.9–10.3)
Chloride: 109 mmol/L (ref 101–111)
Creatinine, Ser: 1.19 mg/dL (ref 0.61–1.24)
GFR calc Af Amer: >60 mL/min (ref >60)
GFR calc non Af Amer: 54 mL/min — ABNORMAL LOW (ref >60)
Glucose, Bld: 203 mg/dL — ABNORMAL HIGH (ref 65–99)
Potassium: 4.1 mmol/L (ref 3.5–5.1)
Sodium: 141 mmol/L (ref 135–145)

## 2017-09-24 LAB — STREP PNEUMONIAE URINARY ANTIGEN: Strep Pneumo Urinary Antigen: NEGATIVE

## 2017-09-24 LAB — HIV ANTIBODY (ROUTINE TESTING W REFLEX): HIV Screen 4th Generation wRfx: NONREACTIVE

## 2017-09-24 LAB — MRSA PCR SCREENING: MRSA by PCR: NEGATIVE

## 2017-09-24 MED ORDER — RESOURCE THICKENUP CLEAR PO POWD
ORAL | Status: DC | PRN
  Filled 2017-09-24: qty 125

## 2017-09-24 NOTE — Discharge Instructions (Signed)

## 2017-09-24 NOTE — Progress Notes (Signed)
PROGRESS NOTE    Derek Hays  MWU:[id number] DOB: December 17, 1930 DOA: 09/23/2017 PCP: Patient, No Pcp Per    Brief Narrative:  84 y.o. male with medical history significant for HTN, PAF, bladder cancer, and recent embolic R MCA CVA with residual L hemiparesis who was brought to the ED today by EMS after facility reported pt had decreased responsiveness as well as a drop in his pulse ox to 72%. By the time he got to the ED he was "perfectly coherent" according to his daughter. He knows where he is, what happened. Dtr states he doesn't like the water at the facility so he avoids drinking, and is likely dehydrated. He was admitted in late April for a UTI and pneumonia associated with AMS. He does have known dysphagia following his CVA and had FEES during his hospitalization at the time of his stroke that indicated oropharyngeal dysphagia, with moderate aspiration risk. He was supposed to be on a thickened liquid diet but per the daughter, his liquids are not being thickened at the rehab where he's been. He was scheduled for a repeat FEES at the [facility] tomorrow. His daughter who provides most of the history states that he has seemed well up until the episode today, with no recent changes in his baseline.   ED Course: Upon arrival pt was alert and oriented, afebrile, with HR in the 60s (mostly paced), BP 126/88, RR 25 (persistently elevated in the ED, up to 28), 97% on RA. CXR showed persistent but improved infiltrates in R base. CT head showed old CVAs, nothing acute. U/A indicated infection.   Assessment & Plan:   Principal Problem:   Altered mental status Active Problems:   Right middle cerebral artery stroke (HCC)   Stage 3 chronic kidney disease (HCC)   PAF (paroxysmal atrial fibrillation) (HCC)   Benign essential HTN   Dysphagia, post-stroke   HCAP (healthcare-associated pneumonia)   Urinary tract infection   Acute respiratory failure with hypoxia (HCC)   Oropharyngeal dysphagia  AMS:    -Toxic metabolic encephalopathy secondary to presenting infection -Much night with antibiotics -Alert and oriented x3.  HCAP -empiric treatment with vanc and cefepime -Strep urine Ag negative -f/u sputum and blood cultures -albuterol nebs prn -O2 support -MRSA nasal swab noted to be negative.  Continue on vancomycin given history of prior enterococcus UTI per below  UTI, bacterial -cont abx as above -f/u UCx results, tailor abx as appropriate -Of note, should not had recent history of enterococcus UTI which was pansensitive in April 2019. -We will continue vancomycin as per above  Acute hypoxic episode, resolved. Possibly due to aspiration event -Continue with supplemental O2 prn -albuterol nebs q6h prn -antibiotics as above  Recent embolic CVA with residual L hemiparesis, dysphagia -cont Eliquis as tolerated -FEES ordered -elevate HOB, aspiration precautions -thickened liquids -PT/OT eval and treat  PAF, with PPM -cont Eliquis. -No evidence of acute bleed  HTN, currently BP on low side -hold imdur at time of admission  CKD, creat at baseline -renally dose all meds -deescalate abx when able, esp vanc -avoid nephrotoxins -Follow daily BMP   DVT prophylaxis: Eliquis Code Status: Full code Family Communication: Patient in room, family not at bedside Disposition Plan: Uncertain at this time  Consultants:     Procedures:     Antimicrobials: Anti-infectives (From admission, onward)   Start     Dose/Rate Route Frequency Ordered Stop   09/24/17 1600  ceFEPIme (MAXIPIME) 2 g in sodium chloride 0.9 % 100 mL  IVPB     2 g 200 mL/hr over 30 Minutes Intravenous Every 24 hours 09/23/17 1507     09/24/17 0500  vancomycin (VANCOCIN) IVPB 750 mg/150 ml premix     750 mg 150 mL/hr over 60 Minutes Intravenous Every 12 hours 09/23/17 1507     09/23/17 1515  vancomycin (VANCOCIN) 2,000 mg in sodium chloride 0.9 % 500 mL IVPB     2,000 mg 250 mL/hr over 120  Minutes Intravenous  Once 09/23/17 1507 09/23/17 1923   09/23/17 1515  ceFEPIme (MAXIPIME) 2 g in sodium chloride 0.9 % 100 mL IVPB     2 g 200 mL/hr over 30 Minutes Intravenous  Once 09/23/17 1507 09/23/17 1610       Subjective: Reports feeling much better today  Objective: Vitals:   09/23/17 1956 09/23/17 2126 09/24/17 0533 09/24/17 1429  BP: 102/65  121/62 (!) 128/95  Pulse: 61  (!) 59 60  Resp: 16  20   Temp: 97.8 F (36.6 C)  (!) 97.4 F (36.3 C) 97.6 F (36.4 C)  TempSrc: Oral  Oral Oral  SpO2: 96% 99% 100% 100%  Weight:      Height:        Intake/Output Summary (Last 24 hours) at 09/24/2017 1805 Last data filed at 09/24/2017 1507 Gross per 24 hour  Intake 1607.5 ml  Output 400 ml  Net 1207.5 ml   Filed Weights   09/23/17 1508  Weight: 92.5 kg (204 lb)    Examination:  General exam: Appears calm and comfortable  Respiratory system: Clear to auscultation. Respiratory effort normal. Cardiovascular system: S1 & S2 heard, RRR Gastrointestinal system: Abdomen is nondistended, soft and nontender. No organomegaly or masses felt. Normal bowel sounds heard. Central nervous system: Alert and oriented. No focal neurological deficits. Extremities: Symmetric 5 x 5 power. Skin: No rashes, lesions Psychiatry: Judgement and insight appear normal. Mood & affect appropriate.   Data Reviewed: I have personally reviewed following labs and imaging studies  CBC: Recent Labs  Lab 09/23/17 1001 09/24/17 0527  WBC 8.8 7.5  NEUTROABS 6.8  --   HGB 12.6* 10.6*  HCT 39.8 32.9*  MCV 86.7 84.6  PLT 182 160   Basic Metabolic Panel: Recent Labs  Lab 09/23/17 1138 09/24/17 0527  NA 142 141  K 3.0* 4.1  CL 109 109  CO2 22 24  GLUCOSE 104* 203*  BUN 15 21*  CREATININE 1.36* 1.19  CALCIUM 7.8* 8.3*   GFR: Estimated Creatinine Clearance: 50.7 mL/min (by C-G formula based on SCr of 1.19 mg/dL). Liver Function Tests: No results for input(s): AST, ALT, ALKPHOS, BILITOT,  PROT, ALBUMIN in the last 168 hours. No results for input(s): LIPASE, AMYLASE in the last 168 hours. No results for input(s): AMMONIA in the last 168 hours. Coagulation Profile: No results for input(s): INR, PROTIME in the last 168 hours. Cardiac Enzymes: No results for input(s): CKTOTAL, CKMB, CKMBINDEX, TROPONINI in the last 168 hours. BNP (last 3 results) No results for input(s): PROBNP in the last 8760 hours. HbA1C: No results for input(s): HGBA1C in the last 72 hours. CBG: No results for input(s): GLUCAP in the last 168 hours. Lipid Profile: No results for input(s): CHOL, HDL, LDLCALC, TRIG, CHOLHDL, LDLDIRECT in the last 72 hours. Thyroid Function Tests: No results for input(s): TSH, T4TOTAL, FREET4, T3FREE, THYROIDAB in the last 72 hours. Anemia Panel: No results for input(s): VITAMINB12, FOLATE, FERRITIN, TIBC, IRON, RETICCTPCT in the last 72 hours. Sepsis Labs: Recent Labs  Lab  09/23/17 1138 09/23/17 1224  LATICACIDVEN 2.0* 1.5    Recent Results (from the past 240 hour(s))  MRSA PCR Screening     Status: None   Collection Time: 09/24/17  5:42 AM  Result Value Ref Range Status   MRSA by PCR NEGATIVE NEGATIVE Final    Comment:        The GeneXpert MRSA Assay (FDA approved for NASAL specimens only), is one component of a comprehensive MRSA colonization surveillance program. It is not intended to diagnose MRSA infection nor to guide or monitor treatment for MRSA infections. Performed at [facility], [place] [address] [place], [place] [address]      Radiology Studies: Ct Head Wo Contrast  Result Date: 09/23/2017 CLINICAL DATA:  Altered mental status and dyspnea since last night. EXAM: CT HEAD WITHOUT CONTRAST TECHNIQUE: Contiguous axial images were obtained from the base of the skull through the vertex without intravenous contrast. COMPARISON:  08/10/2017 FINDINGS: Brain: Examination demonstrates evidence of an old large right MCA territory infarct. Also old  left PCA territory infarct. Small old lacune infarct over the left basal ganglia. No mass, mass effect, shift of midline structures or acute hemorrhage. No evidence of acute infarction. Chronic ischemic microvascular disease. Vascular: No hyperdense vessel or unexpected calcification. Skull: Normal. Negative for fracture or focal lesion. Sinuses/Orbits: Sinuses are well aerated as there is mild mucosal membrane thickening over the floor the left maxillary sinus. Mastoid air cells are clear. Orbits are normal. Other: None. IMPRESSION: No acute findings. Old large right MCA territory infarct. Small left PCA territory infarct and old lacunar infarct over the left basal ganglia. Mild chronic ischemic microvascular disease. Electronically Signed   By: Marin Olp M.D.   On: 09/23/2017 17:13   Dg Chest Port 1 View  Result Date: 09/23/2017 CLINICAL DATA:  Productive cough for 2 days EXAM: PORTABLE CHEST 1 VIEW COMPARISON:  08/11/2017 FINDINGS: Cardiac shadow is stable. Pacing device is again seen and stable. Aortic calcifications are noted. The lungs are well aerated bilaterally some persistent but mildly improved infiltrative changes noted in the right base. No bony abnormality is seen. IMPRESSION: Persistent but improved infiltrative changes in the right base. Electronically Signed   By: Inez Catalina M.D.   On: 09/23/2017 10:56   Dg Swallowing Func-speech Pathology  Result Date: 09/24/2017 Objective Swallowing Evaluation: Type of Study: MBS-Modified Barium Swallow Study  Patient Details Name: Derek Hays MRN: [id number] Date of Birth: Jun 19, 1930 Today's Date: 09/24/2017 Time: SLP Start Time (ACUTE ONLY): 1340 -SLP Stop Time (ACUTE ONLY): 1410 SLP Time Calculation (min) (ACUTE ONLY): 30 min Past Medical History: Past Medical History: Diagnosis Date . Atrial fibrillation ([place])  . Bladder cancer ([place]) 1984 . Hypertension  . Stroke [facility])  Past Surgical History: Past Surgical History: Procedure Laterality Date .  IR CT HEAD LTD  05/27/2017 . IR PERCUTANEOUS ART THROMBECTOMY/INFUSION INTRACRANIAL INC DIAG ANGIO  05/27/2017 . PACEMAKER INSERTION   . RADIOLOGY WITH ANESTHESIA N/A 05/27/2017  Procedure: RADIOLOGY WITH ANESTHESIA;  Surgeon: Luanne Bras, MD;  Location: [facility];  Service: Radiology;  Laterality: N/A; HPI: 84 y.o. male with medical history significant for HTN, PAF, bladder cancer, andembolic R MCA CVA Feb 1517 with residual L hemiparesis who was brought to the ED 6/09/19by EMS after facility reported pt had decreased responsiveness as well as a drop in his pulse ox to 72%.  Dx HCAP.  Pt has hx of dysphagia.  Has been followed by SLP services during prior acute admissions and  s/p CVA in CIR.  Has had several MBS studies.  When last evaluated in 2017-09-08, diet recommendations were for dysphagia 2, nectar thick liquids.  According to H&P, pt was scheduled for f/u FEES through [place] this week; he has been drinking thin liquids at the SNF.  Subjective: alert, good historian Assessment / Plan / Recommendation CHL IP CLINICAL IMPRESSIONS 09/24/2017 Clinical Impression Pt presents with improved swallow function since time of last study in Feb 2019.  Primary deficits are oral, with ongoing sensory loss leading to left buccal pocketing and anterior bolus loss, as well as prolonged mastication.  Pharyngeal function was improved, with no penetration nor aspiration, and improved pharyngeal clearance,  There continues to be mild pyriform residue as a result of narrowed UES/presence of osteophytes that impinge upon posterior pharyngeal/esophageal column.  Recommend advancing diet to thin liquids; continue heart healthy regular solids per pt's preferences.   SLP Visit Diagnosis Dysphagia, oral phase (R13.11) Attention and concentration deficit following -- Frontal lobe and executive function deficit following -- Impact on safety and function --   CHL IP TREATMENT RECOMMENDATION 09/24/2017 Treatment Recommendations Defer treatment  plan to f/u with SLP   Prognosis 08/11/2017 Prognosis for Safe Diet Advancement Good Barriers to Reach Goals -- Barriers/Prognosis Comment -- CHL IP DIET RECOMMENDATION 09/24/2017 SLP Diet Recommendations Regular solids;Thin liquid Liquid Administration via Cup;Straw Medication Administration Whole meds with liquid Compensations Minimize environmental distractions Postural Changes --   CHL IP OTHER RECOMMENDATIONS 09/24/2017 Recommended Consults -- Oral Care Recommendations Oral care BID Other Recommendations --   CHL IP FOLLOW UP RECOMMENDATIONS 08/13/2017 Follow up Recommendations Skilled Nursing facility   [facility] IP FREQUENCY AND DURATION 08/11/2017 Speech Therapy Frequency (ACUTE ONLY) min 2x/week Treatment Duration 2 weeks      CHL IP ORAL PHASE 09/24/2017 Oral Phase Impaired Oral - Pudding Teaspoon -- Oral - Pudding Cup -- Oral - Honey Teaspoon -- Oral - Honey Cup -- Oral - Nectar Teaspoon -- Oral - Nectar Cup -- Oral - Nectar Straw -- Oral - Thin Teaspoon -- Oral - Thin Cup -- Oral - Thin Straw Left anterior bolus loss;Weak lingual manipulation;Lingual pumping;Delayed oral transit;Decreased bolus cohesion Oral - Puree Weak lingual manipulation;Lingual pumping;Delayed oral transit;Decreased bolus cohesion Oral - Mech Soft -- Oral - Regular Weak lingual manipulation;Lingual pumping;Delayed oral transit;Decreased bolus cohesion Oral - Multi-Consistency -- Oral - Pill -- Oral Phase - Comment --  CHL IP PHARYNGEAL PHASE 09/24/2017 Pharyngeal Phase WFL Pharyngeal- Pudding Teaspoon -- Pharyngeal -- Pharyngeal- Pudding Cup -- Pharyngeal -- Pharyngeal- Honey Teaspoon -- Pharyngeal -- Pharyngeal- Honey Cup -- Pharyngeal -- Pharyngeal- Nectar Teaspoon -- Pharyngeal -- Pharyngeal- Nectar Cup -- Pharyngeal -- Pharyngeal- Nectar Straw -- Pharyngeal -- Pharyngeal- Thin Teaspoon -- Pharyngeal -- Pharyngeal- Thin Cup -- Pharyngeal -- Pharyngeal- Thin Straw -- Pharyngeal -- Pharyngeal- Puree -- Pharyngeal -- Pharyngeal- Mechanical Soft  -- Pharyngeal -- Pharyngeal- Regular -- Pharyngeal -- Pharyngeal- Multi-consistency -- Pharyngeal -- Pharyngeal- Pill -- Pharyngeal -- Pharyngeal Comment --  CHL IP CERVICAL ESOPHAGEAL PHASE 09/24/2017 Cervical Esophageal Phase WFL Pudding Teaspoon -- Pudding Cup -- Honey Teaspoon -- Honey Cup -- Nectar Teaspoon -- Nectar Cup -- Nectar Straw -- Thin Teaspoon -- Thin Cup -- Thin Straw -- Puree -- Mechanical Soft -- Regular -- Multi-consistency -- Pill -- Cervical Esophageal Comment -- No flowsheet data found. Juan Quam Laurice 09/24/2017, 2:46 PM               Scheduled Meds: . apixaban  5 mg Oral BID  .  atorvastatin  40 mg Oral q1800  . bethanechol  25 mg Oral 3 times per day  . latanoprost  1 drop Both Eyes QHS  . levothyroxine  125 mcg Oral QAC breakfast  . pregabalin  75 mg Oral BID  . senna-docusate  2 tablet Oral BID  . tamsulosin  0.4 mg Oral QPC supper  . timolol  1 drop Left Eye Daily   Continuous Infusions: . ceFEPime (MAXIPIME) IV Stopped (09/24/17 1605)  . vancomycin 750 mg (09/24/17 1740)     LOS: 1 day   Marylu Lund, MD [facility] Pager [phone]  If 7PM-7AM, please contact night-coverage [email or web address] Password [facility] 09/24/2017, 6:05 PM

## 2017-09-24 NOTE — Evaluation (Signed)
Clinical/Bedside Swallow Evaluation Patient Details  Name: Derek Hays MRN: [id number] Date of Birth: [birth year]  Today's Date: 09/24/2017 Time: SLP Start Time (ACUTE ONLY): 0930 SLP Stop Time (ACUTE ONLY): 0945 SLP Time Calculation (min) (ACUTE ONLY): 15 min  Past Medical History:  Past Medical History:  Diagnosis Date  . Atrial fibrillation ([place])   . Bladder cancer ([place]) 1984  . Hypertension   . Stroke [facility])    Past Surgical History:  Past Surgical History:  Procedure Laterality Date  . IR CT HEAD LTD  05/27/2017  . IR PERCUTANEOUS ART THROMBECTOMY/INFUSION INTRACRANIAL INC DIAG ANGIO  05/27/2017  . PACEMAKER INSERTION    . RADIOLOGY WITH ANESTHESIA N/A 05/27/2017   Procedure: RADIOLOGY WITH ANESTHESIA;  Surgeon: Luanne Bras, MD;  Location: [facility];  Service: Radiology;  Laterality: N/A;   HPI:  83 y.o. 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  Dx HCAP.  Pt has hx of dysphagia.  Has been followed by SLP services during prior acute admissions and s/p CVA in CIR.  Has had several MBS studies.  When last evaluated in 14-Aug-2017, diet recommendations were for dysphagia 2, nectar thick liquids.  According to H&P, pt was scheduled for f/u FEES through [place] this week; he has been drinking thin liquids at the SNF.    Assessment / Plan / Recommendation Clinical Impression  Pt presents with a chronic dysphagia, for which he currently is being treated at [facility].  Today he presents with subtle s/s of aspiration/penetration and persisting dysphagia - decreased sensation left oral cavity, multiple subswallows per bolus, throat clearing with consumption of thin liquids.  It would be beneficial to repeat MBS while here to establish current baseline and determine basis  for ongoing treatment.  Pt is amenable - will schedule for this afternoon.  Continue heart healthy diet, nectar liquids pending results of study.  SLP Visit Diagnosis: Dysphagia, unspecified (R13.10)    Aspiration Risk     Regular diet, nectar liquids   Diet Recommendation     Medication Administration: Whole meds with puree    Other  Recommendations Oral Care Recommendations: Oral care BID   Follow up Recommendations        Frequency and Duration            Prognosis        Swallow Study   General HPI: 84 y.o. male with medical history significant for HTN, PAF, bladder cancer, andembolic R MCA CVA Feb 4854 with residual L hemiparesis who was brought to the ED 6/09/19by EMS after facility reported pt had decreased responsiveness as well as a drop in his pulse ox to 72%.  Dx HCAP.  Pt has hx of dysphagia.  Has been followed by SLP services during prior acute admissions and s/p CVA in CIR.  Has had several MBS studies.  When last evaluated in 2017/08/14, diet recommendations were for dysphagia 2, nectar thick liquids.  According to H&P, pt was scheduled for f/u FEES through [place] this week; he has been drinking thin liquids at the SNF.  Type of Study: Bedside Swallow Evaluation Previous Swallow Assessment: see HPI Diet Prior to this Study: Regular;Nectar-thick liquids Temperature Spikes Noted: No Respiratory Status: Nasal cannula History of Recent Intubation: No Behavior/Cognition: Alert;Cooperative Oral Cavity  Assessment: Within Functional Limits Oral Care Completed by SLP: No Oral Cavity - Dentition: Adequate natural dentition Vision: Functional for self-feeding Self-Feeding Abilities: Able to feed self;Needs set up Patient Positioning: Upright in bed Baseline Vocal Quality: Normal Volitional Cough: Strong Volitional Swallow: Able to elicit    Oral/Motor/Sensory Function Overall Oral Motor/Sensory Function: Mild impairment Facial ROM: Reduced left;Suspected CN VII (facial)  dysfunction Facial Symmetry: Abnormal symmetry left;Suspected CN VII (facial) dysfunction Facial Strength: Reduced left;Suspected CN VII (facial) dysfunction Facial Sensation: Reduced left;Suspected CN V (Trigeminal) dysfunction Lingual Symmetry: Within Functional Limits Velum: Within Functional Limits   Ice Chips Ice chips: Within functional limits   Thin Liquid Thin Liquid: Impaired Presentation: Cup Pharyngeal  Phase Impairments: Wet Vocal Quality;Multiple swallows;Throat Clearing - Delayed    Nectar Thick Nectar Thick Liquid: Within functional limits   Honey Thick Honey Thick Liquid: Not tested   Puree Puree: Within functional limits Presentation: Self Fed   Solid   GO   Solid: Not tested        Juan Quam Laurice 09/24/2017,9:51 AM

## 2017-09-24 NOTE — Progress Notes (Signed)
Physical Therapy Evaluation Patient Details Name: Derek Hays MRN: [id number] DOB: Jan 29, 1931 Today's Date: 09/24/2017   History of Present Illness  Patient is a 84 y/o male presenting to the ED on 09/23/17 due to AMS with reduced SaO2 levels at 72%. Patient admitted for AMS likely due to infection/transient hypoxia, with no evidence of acute CVA. patient with a PMH significant for  HTN, PAF, bladder cancer, and recent embolic R MCA CVA with residual L hemiparesis.  Clinical Impression  Mr. Aylward is a very pleasant 84 y/o male admitted with the above listed diagnosis. Prior to admission patient was a resident of [facility] due to prior CVA with resultant L hemiparesis. Patient today requiring Max A/Total A for bed mobility and transfers. Unsure of PLOF at rehab as patient is a poor historian and no family present to confirm. Patient to benefit from return to SNF for continued short term rehab. PT to follow acutely to maximize functional mobility as able prior to d/c.      Follow Up Recommendations SNF    Equipment Recommendations  None recommended by PT    Recommendations for Other Services       Precautions / Restrictions Precautions Precautions: Fall Restrictions Weight Bearing Restrictions: No      Mobility  Bed Mobility Overal bed mobility: Needs Assistance Bed Mobility: Rolling Rolling: Mod assist;Max assist         General bed mobility comments: towards L side to allow for R UE/LE to assist  Transfers Overall transfer level: Needs assistance               General transfer comment: Maximove utilized today; hopeful for progression to stnad pivot transfers  Ambulation/Gait                Stairs            Wheelchair Mobility    Modified Rankin (Stroke Patients Only)       Balance                                             Pertinent Vitals/Pain Pain Assessment: No/denies pain    Home Living  Family/patient expects to be discharged to:: Skilled nursing facility                      Prior Function Level of Independence: Needs assistance   Gait / Transfers Assistance Needed: patient a resident of [facility] - unsure of level of function as no family is present           Hand Dominance   Dominant Hand: Right    Extremity/Trunk Assessment   Upper Extremity Assessment Upper Extremity Assessment: Defer to OT evaluation    Lower Extremity Assessment Lower Extremity Assessment: Generalized weakness(little functional use of L LE even with heavy cueing)       Communication   Communication: HOH  Cognition Arousal/Alertness: Awake/alert Behavior During Therapy: WFL for tasks assessed/performed Overall Cognitive Status: No family/caregiver present to determine baseline cognitive functioning                                 General Comments: reports very limited use of L side due to recent CVA - has been at rehab for this      General Comments  Exercises     Assessment/Plan    PT Assessment Patient needs continued PT services  PT Problem List Decreased strength;Decreased activity tolerance;Decreased balance;Decreased mobility;Decreased knowledge of use of DME;Decreased safety awareness       PT Treatment Interventions Functional mobility training;Therapeutic activities;Therapeutic exercise;Balance training;Neuromuscular re-education;Patient/family education    PT Goals (Current goals can be found in the Care Plan section)  Acute Rehab PT Goals Patient Stated Goal: improve mobility PT Goal Formulation: With patient Time For Goal Achievement: 10/08/17 Potential to Achieve Goals: Fair    Frequency Min 2X/week   Barriers to discharge        Co-evaluation               AM-PAC PT "6 Clicks" Daily Activity  Outcome Measure Difficulty turning over in bed (including adjusting bedclothes, sheets and blankets)?:  Unable Difficulty moving from lying on back to sitting on the side of the bed? : Unable Difficulty sitting down on and standing up from a chair with arms (e.g., wheelchair, bedside commode, etc,.)?: Unable Help needed moving to and from a bed to chair (including a wheelchair)?: Total Help needed walking in hospital room?: Total Help needed climbing 3-5 steps with a railing? : Total 6 Click Score: 6    End of Session   Activity Tolerance: Patient tolerated treatment well Patient left: in chair;with call bell/phone within reach Nurse Communication: Mobility status PT Visit Diagnosis: Muscle weakness (generalized) (M62.81);Unsteadiness on feet (R26.81);Other abnormalities of gait and mobility (R26.89)    Time: 6606-3016 PT Time Calculation (min) (ACUTE ONLY): 31 min   Charges:   PT Evaluation $PT Eval Moderate Complexity: 1 Mod     PT G Codes:         Lanney Gins, PT, DPT 09/24/17 4:33 PM

## 2017-09-24 NOTE — Progress Notes (Signed)
Modified Barium Swallow Progress Note  Patient Details  Name: CHONG WOJDYLA MRN: [id number] Date of Birth: Apr 19, 1930  Today's Date: 09/24/2017  Modified Barium Swallow completed.  Full report located under Chart Review in the Imaging Section.  Brief recommendations include the following:  Clinical Impression  Pt presents with improved swallow function since time of last study in Feb 2019.  Primary deficits are oral, with ongoing sensory loss leading to left buccal pocketing and anterior bolus loss, as well as prolonged mastication.  Pharyngeal function was improved, with no penetration nor aspiration, and improved pharyngeal clearance,  There continues to be mild pyriform residue as a result of narrowed UES/presence of osteophytes that impinge upon posterior pharyngeal/esophageal column.  Recommend advancing diet to thin liquids; continue heart healthy regular solids per pt's preferences.  Monitor for pocketing left cheek.    Swallow Evaluation Recommendations       SLP Diet Recommendations: Regular solids;Thin liquid   Liquid Administration via: Cup;Straw   Medication Administration: Whole meds with liquid   Supervision: Patient able to self feed   Compensations: Minimize environmental distractions       Oral Care Recommendations: Oral care BID        Juan Quam Laurice 09/24/2017,2:46 PM

## 2017-09-24 NOTE — NC FL2 (Signed)
[place] MEDICAID FL2 LEVEL OF CARE SCREENING TOOL     IDENTIFICATION  Patient Name: Derek Hays Birthdate: 07-Dec-1930 Sex: male Admission Date (Current Location): 09/23/2017  [facility] and [place] Number:  Herbalist and Address:  [facility] [place], [place] [address], [place], [place] [address]      Provider Number: [id number]  Attending Physician Name and Address:  Donne Hazel, MD  Relative Name and Phone Number:  Basilia Jumbo [phone], wife    Current Level of Care: Hospital Recommended Level of Care: [place] Prior Approval Number:    Date Approved/Denied:   PASRR Number: [id number]  Discharge Plan: SNF    Current Diagnoses: Patient Active Problem List   Diagnosis Date Noted  . Urinary tract infection 09/23/2017  . Acute respiratory failure with hypoxia ([place]) 09/23/2017  . Oropharyngeal dysphagia 09/23/2017  . Palliative care by specialist   . Sepsis due to urinary tract infection ([place])   . HCAP (healthcare-associated pneumonia)   . Altered mental status 08/10/2017  . Acute blood loss anemia   . Stage 3 chronic kidney disease ([place])   . PAF (paroxysmal atrial fibrillation) ([place])   . Benign essential HTN   . Dysphagia, post-stroke   . 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 Stroke (cerebrum) ([place]) 05/27/2017    Orientation RESPIRATION BLADDER Height & Weight     Self, Situation, Place  Normal Incontinent, External catheter Weight: 204 lb (92.5 kg) Height:  6' (182.9 cm)  BEHAVIORAL SYMPTOMS/MOOD NEUROLOGICAL BOWEL NUTRITION STATUS      Incontinent Diet(see discharge summary)  AMBULATORY STATUS COMMUNICATION OF NEEDS Skin   Extensive Assist Verbally Normal                       Personal Care Assistance Level of Assistance  Bathing, Feeding, Dressing Bathing Assistance: Maximum assistance Feeding assistance: Limited  assistance Dressing Assistance: Maximum assistance     Functional Limitations Info  Sight, Hearing, Speech Sight Info: Adequate Hearing Info: Impaired Speech Info: Adequate    SPECIAL CARE FACTORS FREQUENCY  PT (By licensed PT), OT (By licensed OT)     PT Frequency: 5x week OT Frequency: 5x week            Contractures Contractures Info: Not present    Additional Factors Info  Code Status, Allergies, Psychotropic Code Status Info: Full Code Allergies Info: No Known Allergies Psychotropic Info: pregabalin (LYRICA) capsule 75 mg 2x daily PO         Current Medications (09/24/2017):  This is the current hospital active medication list Current Facility-Administered Medications  Medication Dose Route Frequency Provider Last Rate Last Dose  . acetaminophen (TYLENOL) tablet 500 mg  500 mg Oral Q4H PRN Janora Norlander, MD      . albuterol (PROVENTIL) (2.5 MG/3ML) 0.083% nebulizer solution 2.5 mg  2.5 mg Nebulization Q6H PRN Janora Norlander, MD      . apixaban Arne Cleveland) tablet 5 mg  5 mg Oral BID Janora Norlander, MD   5 mg at 09/24/17 1009  . atorvastatin (LIPITOR) tablet 40 mg  40 mg Oral q1800 Janora Norlander, MD   40 mg at 09/23/17 2104  . benzonatate (TESSALON) capsule 200 mg  200 mg Oral TID PRN Janora Norlander, MD      . bethanechol (URECHOLINE) tablet 25 mg  25 mg Oral 3 times per day Royal Piedra  M, MD   25 mg at 09/24/17 0757  . ceFEPIme (MAXIPIME) 2 g in sodium chloride 0.9 % 100 mL IVPB  2 g Intravenous Q24H Janora Norlander, MD      . latanoprost (XALATAN) 0.005 % ophthalmic solution 1 drop  1 drop Both Eyes QHS Janora Norlander, MD   1 drop at 09/23/17 2104  . levothyroxine (SYNTHROID, LEVOTHROID) tablet 125 mcg  125 mcg Oral QAC breakfast Janora Norlander, MD   125 mcg at 09/24/17 0756  . pregabalin (LYRICA) capsule 75 mg  75 mg Oral BID Janora Norlander, MD   75 mg at 09/24/17 1009  . [place]   Oral PRN Janora Norlander, MD      .  senna-docusate (Senokot-S) tablet 2 tablet  2 tablet Oral BID Janora Norlander, MD   2 tablet at 09/24/17 1009  . tamsulosin (FLOMAX) capsule 0.4 mg  0.4 mg Oral QPC supper Janora Norlander, MD   0.4 mg at 09/23/17 2105  . timolol (TIMOPTIC) 0.5 % ophthalmic solution 1 drop  1 drop Left Eye Daily Janora Norlander, MD   1 drop at 09/24/17 1056  . vancomycin (VANCOCIN) IVPB 750 mg/150 ml premix  750 mg Intravenous Q12H Janora Norlander, MD   Stopped at 09/24/17 [phone]     Discharge Medications: Please see discharge summary for a list of discharge medications.  Relevant Imaging Results:  Relevant Lab Results:   Additional Information SS# [place], [place]

## 2017-09-24 NOTE — Social Work (Signed)
CSW aware pt from [facility], will continue to follow for disposition.  [place], [place] Work [phone]

## 2017-09-25 LAB — BLOOD CULTURE ID PANEL (REFLEXED)

## 2017-09-25 LAB — COMPREHENSIVE METABOLIC PANEL
ALK PHOS: 55 U/L (ref 38–126)
ALT: 10 U/L — AB (ref 17–63)
AST: 18 U/L (ref 15–41)
Albumin: 2.2 g/dL — ABNORMAL LOW (ref 3.5–5.0)
Anion gap: 8 (ref 5–15)
BILIRUBIN TOTAL: 0.8 mg/dL (ref 0.3–1.2)
BUN: 19 mg/dL (ref 6–20)
CO2: 26 mmol/L (ref 22–32)
CREATININE: 0.93 mg/dL (ref 0.61–1.24)
Calcium: 8.3 mg/dL — ABNORMAL LOW (ref 8.9–10.3)
Chloride: 107 mmol/L (ref 101–111)
GFR calc Af Amer: >60 mL/min (ref >60)
Glucose, Bld: 94 mg/dL (ref 65–99)
Potassium: 3.5 mmol/L (ref 3.5–5.1)
Sodium: 141 mmol/L (ref 135–145)
TOTAL PROTEIN: 5.4 g/dL — AB (ref 6.5–8.1)

## 2017-09-25 NOTE — Clinical Social Work Note (Signed)
Clinical Social Work Assessment  Patient Details  Name: Derek Hays MRN: [id number] Date of Birth: [birth year]  Date of referral:  09/25/17               Reason for consult:  Facility Placement, Discharge Planning                Permission sought to share information with:  Facility Sport and exercise psychologist, Family Supports Permission granted to share information::  Yes, Verbal Permission Granted  Name::     Derek Hays  Agency::  [employer]  Relationship::  wife  Contact Information:  [phone]  Housing/Transportation Living arrangements for the past 2 months:  [place] of Information:  Spouse Patient Interpreter Needed:  None Criminal Activity/Legal Involvement Pertinent to Current Situation/Hospitalization:  No - Comment as needed Significant Relationships:  Other Family Members, Adult Children, Spouse Lives with:  Spouse, Facility Resident Do you feel safe going back to the place where you live?  Yes(with equipment ready) Need for family participation in patient care:  Yes (Comment)  Care giving concerns:  Pt is total assist with most IADLs and ADLs. Pt has been in and out of hospital, and recently has been at [facility]. Pt wife is available to assist and has equipment at home.    Social Worker assessment / plan: CSW spoke with pt wife, [place] on the telephone. Pt wife states that pt has been at [facility] in between his most recent hospitalizations. Pt wife and staff at [facility] were arranging discharge back home on 6/18 before this most recent hospitalization. CSW discussed discharge planning with pt wife. She is not sure whether or not she is ready to take pt home as he does not have wheelchair to mobilize around the house. Pt does have hospital bed, table, and some other equipment. Pt spouse states this is all provided through the [place]. Pt spouse does state some frustration with this process being slower as she met with a representative  about the wheelchairs in May.   Pt wife states she is okay with pt going back to [facility] and then hopefully proceeding with plan for pt to return home. CSW to follow up with care team and SNF staff to discuss this plan. Continuing to follow.   Employment status:  Retired Forensic scientist:  Information systems manager, IT sales professional PT Recommendations:  [place], [place] / Referral to community resources:  [place]  Patient/Family's Response to care:  Pt wife amenable to speaking with CSW, and is okay with pt returning to [facility], pt wife also states that if pt needs to return home she will get a wheelchair for him until the other ones come in from the [place].   Patient/Family's Understanding of and Emotional Response to Diagnosis, Current Treatment, and Prognosis:  Pt wife states understanding of diagnosis, current treatment and prognosis. Pt wife states reasonable expectations of pt progress and abilities moving forward. Pt wife willing to help however she can with eventually transitioning pt back home.   Emotional Assessment Appearance:  Appears stated age Attitude/Demeanor/Rapport:  Gracious Affect (typically observed):  Accepting, Appropriate Orientation:  Oriented to Place, Oriented to Self, Oriented to Situation Alcohol / Substance use:  Not Applicable Psych involvement (Current and /or in the community):  No (Comment)  Discharge Needs  Concerns to be addressed:  Discharge Planning Concerns, Care Coordination Readmission within the last 30 days:  Yes Current discharge risk:  Physical Impairment, Dependent with Mobility Barriers to  Discharge:  Continued Medical Work up, [employer], [place] 09/25/2017, 1:33 PM

## 2017-09-25 NOTE — Progress Notes (Signed)
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 By the time he got to the ED he was "perfectly coherent" according to his daughter. He knows where he is, what happened. Dtr states he doesn't like the water at the facility so he avoids drinking, and is likely dehydrated. He was admitted in late April for a UTI and pneumonia associated with AMS.He does have known dysphagia following his CVA and had FEES during his hospitalization at the time of his stroke that indicated oropharyngeal dysphagia, with moderate aspiration risk. He was supposed to be on a thickened liquid diet but per the daughter, his liquids are not being thickened at the rehab where he's been. He was scheduled for a repeat FEES at the [facility] tomorrow. His daughter who provides most of the history states that he has seemed well up until the episode today, with no recent changes in his baseline      Subjective   Patient seen and examined, denies chest pain or shortness of breath.  He is alert oriented x3   Assessment/Plan:     1. Altered mental status-resolved, likely from metabolic encephalopathy due to underlying infection.  At this time patient is alert oriented x3. 2. Healthcare associated pneumonia-improving patient was started on antibiotics  vancomycin and cefepime strep urinary antigen is negative.  Blood cultures negative to date.  Vancomycin has been discontinued. 3. UTI - urine culture growing E. coli-, 80,000 colonies per mL, final sensitivities pending.  Continue antibiotics cefepime 4. Hypothyroidism-continue Synthroid 5. Acute hypoxic episode-resolved, likely from  pneumonia.  Continue antibiotics as above.  Albuterol nebulizer every 6 hours as needed. 6. Recent embolic CVA with residual left hemiparesis, dysphagia-continue Eliquis as tolerated speech therapy saw the patient started on regular solids with thin liquid 7. Paroxysmal atrial fibrillation-continue Eliquis 8. Hypertension-Imdur currently on hold as BP was low at the time of admission 9. Chronic kidney disease stage III-creatinine at baseline, avoid nephrotoxins.    DVT prophylaxis: Eliquis  Code Status: Full code  Family Communication: No family at bedside  Disposition Plan: likely skilled nursing facility in a.m.   Consultants:    Procedures:     Antibiotics:   Anti-infectives (From admission, onward)   Start     Dose/Rate Route Frequency Ordered Stop   09/24/17 1600  ceFEPIme (MAXIPIME) 2 g in sodium chloride 0.9 % 100 mL IVPB     2 g 200 mL/hr over 30 Minutes Intravenous Every 24 hours 09/23/17 1507     09/24/17 0500  vancomycin (VANCOCIN) IVPB 750 mg/150 ml premix  Status:  Discontinued     750 mg 150 mL/hr over 60 Minutes Intravenous Every 12 hours 09/23/17 1507 09/25/17 1148   09/23/17 1515  vancomycin (VANCOCIN) 2,000 mg in sodium chloride 0.9 % 500 mL IVPB     2,000 mg 250 mL/hr over 120 Minutes Intravenous  Once 09/23/17 1507 09/23/17 1923   09/23/17 1515  ceFEPIme (MAXIPIME) 2 g in sodium chloride 0.9 % 100 mL IVPB     2 g 200 mL/hr over 30 Minutes Intravenous  Once 09/23/17 1507 09/23/17 1610       Objective   Vitals:  09/24/17 1429 09/24/17 2120 09/25/17 0456 09/25/17 1453  BP: (!) 128/95 105/65 116/69 110/69  Pulse: 60 (!) 59 (!) 59 (!) 59  Resp:  17 18   Temp: 97.6 F (36.4 C) 98.4 F (36.9 C) 98.6 F (37 C) 98.3 F (36.8 C)  TempSrc: Oral Oral Oral Oral  SpO2: 100% 100% 99% 100%  Weight:      Height:        Intake/Output Summary (Last 24 hours) at 09/25/2017 1815 Last data filed at 09/25/2017 1145 Gross per 24 hour  Intake 455 ml   Output 851 ml  Net -396 ml   Filed Weights   09/23/17 1508  Weight: 92.5 kg (204 lb)     Physical Examination:    General: Appears in no acute distress  Cardiovascular: S1-S2, regular  Respiratory: Clear to auscultation bilaterally  Abdomen: Soft, nontender, no organomegaly  Extremities: No cyanosis/clubbing/edema of the lower extremities  Neurologic: Alert oriented x3, no focal deficit     Data Reviewed: I have personally reviewed following labs and imaging studies  CBG: No results for input(s): GLUCAP in the last 168 hours.  CBC: Recent Labs  Lab 09/23/17 1001 09/24/17 0527  WBC 8.8 7.5  NEUTROABS 6.8  --   HGB 12.6* 10.6*  HCT 39.8 32.9*  MCV 86.7 84.6  PLT 182 831    Basic Metabolic Panel: Recent Labs  Lab 09/23/17 1138 09/24/17 0527 09/25/17 0417  NA 142 141 141  K 3.0* 4.1 3.5  CL 109 109 107  CO2 22 24 26   GLUCOSE 104* 203* 94  BUN 15 21* 19  CREATININE 1.36* 1.19 0.93  CALCIUM 7.8* 8.3* 8.3*    Recent Results (from the past 240 hour(s))  Culture, blood (routine x 2) Call MD if unable to obtain prior to antibiotics being given     Status: None (Preliminary result)   Collection Time: 09/23/17  8:30 PM  Result Value Ref Range Status   Specimen Description BLOOD LEFT HAND  Final   Special Requests   Final    BOTTLES DRAWN AEROBIC ONLY Blood Culture results may not be optimal due to an inadequate volume of blood received in culture bottles   Culture   Final    NO GROWTH 1 DAY Performed at [facility], [place] [address], [place], [place] [address]    Report Status PENDING  Incomplete  Culture, blood (routine x 2) Call MD if unable to obtain prior to antibiotics being given     Status: None (Preliminary result)   Collection Time: 09/23/17  8:35 PM  Result Value Ref Range Status   Specimen Description BLOOD LEFT HAND  Final   Special Requests   Final    BOTTLES DRAWN AEROBIC ONLY Blood Culture results may not be optimal due to an  inadequate volume of blood received in culture bottles   Culture   Final    NO GROWTH 1 DAY Performed at [facility], [place] [address], [place], [place] [address]    Report Status PENDING  Incomplete  Urine culture     Status: Abnormal (Preliminary result)   Collection Time: 09/23/17 10:52 PM  Result Value Ref Range Status   Specimen Description URINE, CLEAN CATCH  Final   Special Requests   Final    vanc, cefepime Normal Performed at [facility], [place] [address] [place], [place] [address]    Culture 80,000 COLONIES/mL ESCHERICHIA COLI (A)  Final   Report Status PENDING  Incomplete  MRSA PCR Screening     Status: None   Collection Time: 09/24/17  5:42 AM  Result Value Ref Range Status   MRSA by PCR NEGATIVE NEGATIVE Final    Comment:        The GeneXpert MRSA Assay (FDA approved for NASAL specimens only), is one component of a comprehensive MRSA colonization surveillance program. It is not intended to diagnose MRSA infection nor to guide or monitor treatment for MRSA infections. Performed at [facility], [place] [address] [place], [place] [address]      Liver Function Tests: Recent Labs  Lab 09/25/17 0417  AST 18  ALT 10*  ALKPHOS 55  BILITOT 0.8  PROT 5.4*  ALBUMIN 2.2*   No results for input(s): LIPASE, AMYLASE in the last 168 hours. No results for input(s): AMMONIA in the last 168 hours.  Cardiac Enzymes: No results for input(s): CKTOTAL, CKMB, CKMBINDEX, TROPONINI in the last 168 hours. BNP (last 3 results) No results for input(s): BNP in the last 8760 hours.  ProBNP (last 3 results) No results for input(s): PROBNP in the last 8760 hours.    Studies: Dg Swallowing Func-speech Pathology  Result Date: 09/24/2017 Objective Swallowing Evaluation: Type of Study: MBS-Modified Barium Swallow Study  Patient Details Name: Derek Hays MRN: [id number] Date of Birth: 1931-02-09 Today's Date: 09/24/2017 Time: SLP Start Time (ACUTE ONLY): 1340  -SLP Stop Time (ACUTE ONLY): 1410 SLP Time Calculation (min) (ACUTE ONLY): 30 min Past Medical History: Past Medical History: Diagnosis Date . Atrial fibrillation ([place])  . Bladder cancer ([place]) 1984 . Hypertension  . Stroke [facility])  Past Surgical History: Past Surgical History: Procedure Laterality Date . IR CT HEAD LTD  05/27/2017 . IR PERCUTANEOUS ART THROMBECTOMY/INFUSION INTRACRANIAL INC DIAG ANGIO  05/27/2017 . PACEMAKER INSERTION   . RADIOLOGY WITH ANESTHESIA N/A 05/27/2017  Procedure: RADIOLOGY WITH ANESTHESIA;  Surgeon: Luanne Bras, MD;  Location: [facility];  Service: Radiology;  Laterality: N/A; HPI: 84 y.o. male with medical history significant for HTN, PAF, bladder cancer, andembolic R MCA CVA Feb 8527 with residual L hemiparesis who was brought to the ED 6/09/19by EMS after facility reported pt had decreased responsiveness as well as a drop in his pulse ox to 72%.  Dx HCAP.  Pt has hx of dysphagia.  Has been followed by SLP services during prior acute admissions and s/p CVA in CIR.  Has had several MBS studies.  When last evaluated in 08-18-2017, diet recommendations were for dysphagia 2, nectar thick liquids.  According to H&P, pt was scheduled for f/u FEES through [place] this week; he has been drinking thin liquids at the SNF.  Subjective: alert, good historian Assessment / Plan / Recommendation CHL IP CLINICAL IMPRESSIONS 09/24/2017 Clinical Impression Pt presents with improved swallow function since time of last study in Feb 2019.  Primary deficits are oral, with ongoing sensory loss leading to left buccal pocketing and anterior bolus loss, as well as prolonged mastication.  Pharyngeal function was improved, with no penetration nor aspiration, and improved pharyngeal clearance,  There continues to be mild pyriform residue as a result of narrowed UES/presence of osteophytes that impinge upon posterior pharyngeal/esophageal column.  Recommend advancing diet to thin liquids; continue heart healthy regular  solids per pt's preferences.   SLP Visit Diagnosis Dysphagia, oral phase (R13.11) Attention and concentration deficit following -- Frontal lobe and executive function deficit following -- Impact on safety and function --   CHL IP TREATMENT RECOMMENDATION 09/24/2017 Treatment Recommendations  Defer treatment plan to f/u with SLP   Prognosis 08/11/2017 Prognosis for Safe Diet Advancement Good Barriers to Reach Goals -- Barriers/Prognosis Comment -- CHL IP DIET RECOMMENDATION 09/24/2017 SLP Diet Recommendations Regular solids;Thin liquid Liquid Administration via Cup;Straw Medication Administration Whole meds with liquid Compensations Minimize environmental distractions Postural Changes --   CHL IP OTHER RECOMMENDATIONS 09/24/2017 Recommended Consults -- Oral Care Recommendations Oral care BID Other Recommendations --   CHL IP FOLLOW UP RECOMMENDATIONS 08/13/2017 Follow up Recommendations Skilled Nursing facility   [facility] IP FREQUENCY AND DURATION 08/11/2017 Speech Therapy Frequency (ACUTE ONLY) min 2x/week Treatment Duration 2 weeks      CHL IP ORAL PHASE 09/24/2017 Oral Phase Impaired Oral - Pudding Teaspoon -- Oral - Pudding Cup -- Oral - Honey Teaspoon -- Oral - Honey Cup -- Oral - Nectar Teaspoon -- Oral - Nectar Cup -- Oral - Nectar Straw -- Oral - Thin Teaspoon -- Oral - Thin Cup -- Oral - Thin Straw Left anterior bolus loss;Weak lingual manipulation;Lingual pumping;Delayed oral transit;Decreased bolus cohesion Oral - Puree Weak lingual manipulation;Lingual pumping;Delayed oral transit;Decreased bolus cohesion Oral - Mech Soft -- Oral - Regular Weak lingual manipulation;Lingual pumping;Delayed oral transit;Decreased bolus cohesion Oral - Multi-Consistency -- Oral - Pill -- Oral Phase - Comment --  CHL IP PHARYNGEAL PHASE 09/24/2017 Pharyngeal Phase WFL Pharyngeal- Pudding Teaspoon -- Pharyngeal -- Pharyngeal- Pudding Cup -- Pharyngeal -- Pharyngeal- Honey Teaspoon -- Pharyngeal -- Pharyngeal- Honey Cup -- Pharyngeal --  Pharyngeal- Nectar Teaspoon -- Pharyngeal -- Pharyngeal- Nectar Cup -- Pharyngeal -- Pharyngeal- Nectar Straw -- Pharyngeal -- Pharyngeal- Thin Teaspoon -- Pharyngeal -- Pharyngeal- Thin Cup -- Pharyngeal -- Pharyngeal- Thin Straw -- Pharyngeal -- Pharyngeal- Puree -- Pharyngeal -- Pharyngeal- Mechanical Soft -- Pharyngeal -- Pharyngeal- Regular -- Pharyngeal -- Pharyngeal- Multi-consistency -- Pharyngeal -- Pharyngeal- Pill -- Pharyngeal -- Pharyngeal Comment --  CHL IP CERVICAL ESOPHAGEAL PHASE 09/24/2017 Cervical Esophageal Phase WFL Pudding Teaspoon -- Pudding Cup -- Honey Teaspoon -- Honey Cup -- Nectar Teaspoon -- Nectar Cup -- Nectar Straw -- Thin Teaspoon -- Thin Cup -- Thin Straw -- Puree -- Mechanical Soft -- Regular -- Multi-consistency -- Pill -- Cervical Esophageal Comment -- No flowsheet data found. Juan Quam Laurice 09/24/2017, 2:46 PM               Scheduled Meds: . apixaban  5 mg Oral BID  . atorvastatin  40 mg Oral q1800  . bethanechol  25 mg Oral 3 times per day  . latanoprost  1 drop Both Eyes QHS  . levothyroxine  125 mcg Oral QAC breakfast  . pregabalin  75 mg Oral BID  . senna-docusate  2 tablet Oral BID  . tamsulosin  0.4 mg Oral QPC supper  . timolol  1 drop Left Eye Daily      Time spent: 25 min  [facility] Pager [phone]. If 7PM-7AM, please contact night-coverage at [email or web address], Office  [phone]  password [email or web address]  09/25/2017, 6:15 PM  LOS: 2 days

## 2017-09-26 DIAGNOSIS — H04129 Dry eye syndrome of unspecified lacrimal gland: Secondary | ICD-10-CM | POA: Diagnosis not present

## 2017-09-26 DIAGNOSIS — R1312 Dysphagia, oropharyngeal phase: Secondary | ICD-10-CM | POA: Diagnosis not present

## 2017-09-26 DIAGNOSIS — J189 Pneumonia, unspecified organism: Secondary | ICD-10-CM | POA: Diagnosis not present

## 2017-09-26 DIAGNOSIS — I69321 Dysphasia following cerebral infarction: Secondary | ICD-10-CM | POA: Diagnosis not present

## 2017-09-26 DIAGNOSIS — I69315 Cognitive social or emotional deficit following cerebral infarction: Secondary | ICD-10-CM | POA: Diagnosis not present

## 2017-09-26 DIAGNOSIS — I4901 Ventricular fibrillation: Secondary | ICD-10-CM | POA: Diagnosis not present

## 2017-09-26 DIAGNOSIS — Z9981 Dependence on supplemental oxygen: Secondary | ICD-10-CM | POA: Diagnosis not present

## 2017-09-26 DIAGNOSIS — E785 Hyperlipidemia, unspecified: Secondary | ICD-10-CM | POA: Diagnosis not present

## 2017-09-26 DIAGNOSIS — N183 Chronic kidney disease, stage 3 (moderate): Secondary | ICD-10-CM | POA: Diagnosis not present

## 2017-09-26 DIAGNOSIS — N401 Enlarged prostate with lower urinary tract symptoms: Secondary | ICD-10-CM | POA: Diagnosis not present

## 2017-09-26 DIAGNOSIS — R4182 Altered mental status, unspecified: Secondary | ICD-10-CM | POA: Diagnosis not present

## 2017-09-26 DIAGNOSIS — N3000 Acute cystitis without hematuria: Secondary | ICD-10-CM | POA: Diagnosis not present

## 2017-09-26 DIAGNOSIS — M6281 Muscle weakness (generalized): Secondary | ICD-10-CM | POA: Diagnosis not present

## 2017-09-26 DIAGNOSIS — R270 Ataxia, unspecified: Secondary | ICD-10-CM | POA: Diagnosis not present

## 2017-09-26 DIAGNOSIS — I10 Essential (primary) hypertension: Secondary | ICD-10-CM | POA: Diagnosis not present

## 2017-09-26 DIAGNOSIS — I480 Paroxysmal atrial fibrillation: Secondary | ICD-10-CM | POA: Diagnosis not present

## 2017-09-26 DIAGNOSIS — R293 Abnormal posture: Secondary | ICD-10-CM | POA: Diagnosis not present

## 2017-09-26 DIAGNOSIS — Z7901 Long term (current) use of anticoagulants: Secondary | ICD-10-CM | POA: Diagnosis not present

## 2017-09-26 DIAGNOSIS — I69391 Dysphagia following cerebral infarction: Secondary | ICD-10-CM | POA: Diagnosis not present

## 2017-09-26 DIAGNOSIS — H409 Unspecified glaucoma: Secondary | ICD-10-CM | POA: Diagnosis not present

## 2017-09-26 DIAGNOSIS — I69354 Hemiplegia and hemiparesis following cerebral infarction affecting left non-dominant side: Secondary | ICD-10-CM | POA: Diagnosis not present

## 2017-09-26 DIAGNOSIS — I129 Hypertensive chronic kidney disease with stage 1 through stage 4 chronic kidney disease, or unspecified chronic kidney disease: Secondary | ICD-10-CM | POA: Diagnosis not present

## 2017-09-26 DIAGNOSIS — H353290 Exudative age-related macular degeneration, unspecified eye, stage unspecified: Secondary | ICD-10-CM | POA: Diagnosis not present

## 2017-09-26 DIAGNOSIS — I69822 Dysarthria following other cerebrovascular disease: Secondary | ICD-10-CM | POA: Diagnosis not present

## 2017-09-26 DIAGNOSIS — E039 Hypothyroidism, unspecified: Secondary | ICD-10-CM | POA: Diagnosis not present

## 2017-09-26 DIAGNOSIS — K5900 Constipation, unspecified: Secondary | ICD-10-CM | POA: Diagnosis not present

## 2017-09-26 DIAGNOSIS — Z950 Presence of cardiac pacemaker: Secondary | ICD-10-CM | POA: Diagnosis not present

## 2017-09-26 DIAGNOSIS — R339 Retention of urine, unspecified: Secondary | ICD-10-CM | POA: Diagnosis not present

## 2017-09-26 DIAGNOSIS — N390 Urinary tract infection, site not specified: Secondary | ICD-10-CM | POA: Diagnosis not present

## 2017-09-26 DIAGNOSIS — R278 Other lack of coordination: Secondary | ICD-10-CM | POA: Diagnosis not present

## 2017-09-26 LAB — CULTURE, BLOOD (ROUTINE X 2)

## 2017-09-26 LAB — URINE CULTURE
Culture: 80000 — AB
Special Requests: NORMAL

## 2017-09-26 MED ORDER — CEPHALEXIN 500 MG PO CAPS: 500.0000 mg | ORAL_CAPSULE | Freq: Two times a day (BID) | ORAL | 0 refills | Status: DC

## 2017-09-26 MED ORDER — AMOXICILLIN-POT CLAVULANATE 875-125 MG PO TABS: 1.0000 | ORAL_TABLET | Freq: Two times a day (BID) | ORAL | 0 refills | Status: AC

## 2017-09-26 MED ORDER — SODIUM CHLORIDE 0.9 % IV SOLN
2.0000 g | Freq: Two times a day (BID) | INTRAVENOUS | Status: DC
  Administered 2017-09-26: 2 g via INTRAVENOUS
  Filled 2017-09-26 (×3): qty 2

## 2017-09-26 NOTE — Progress Notes (Signed)
Pharmacy Antibiotic Note  Derek Hays is a 84 y.o. male admitted on 09/23/2017 with AMS. Pharmacy has been consulted for cefepime dosing for PNA +UTI  SCr down to 0.93. Afebrile, WBC normal.  Plan: Increase cefepime to 2g IV q12h Monitor renal fxn, clinical progress, LOT   Height: 6' (182.9 cm) Weight: 204 lb (92.5 kg) IBW/kg (Calculated) : 77.6  Temp (24hrs), Avg:98.6 F (37 C), Min:98.3 F (36.8 C), Max:98.8 F (37.1 C)  Recent Labs  Lab 09/23/17 1001 09/23/17 1138 09/23/17 1224 09/24/17 0527 09/25/17 0417  WBC 8.8  --   --  7.5  --   CREATININE  --  1.36*  --  1.19 0.93  LATICACIDVEN  --  2.0* 1.5  --   --     Estimated Creatinine Clearance: 64.9 mL/min (by C-G formula based on SCr of 0.93 mg/dL).    No Known Allergies   Vanc 6/10 >>6/12 Cefepime 6/10 >>  6/10 urine: 80K EColi- R to cipro only 6/10 BCx: 1/2 GPC > reincubated 6/12 BCID: MRSE 6/11 MRSA: neg  Julissa Browning D. Sheena Simonis, PharmD, BCPS Clinical Pharmacist [phone] Please check AMION for all [facility] numbers 09/26/2017 9:35 AM

## 2017-09-26 NOTE — Social Work (Signed)
Clinical Social Worker facilitated patient discharge including contacting patient family and facility to confirm patient discharge plans.  Clinical information faxed to facility and family agreeable with plan.  CSW arranged ambulance transport via [place] to [facility]. RN to call [phone] with report prior to discharge.  Clinical Social Worker will sign off for now as social work intervention is no longer needed. Please consult [place] again if new need arises.  Alexander Mt, [place] Social Worker [phone]

## 2017-09-26 NOTE — Care Management Important Message (Signed)
Important Message  Patient Details  Name: Derek Hays MRN: [id number] Date of Birth: [birth year]   Medicare Important Message Given:  Yes    Orbie Pyo 09/26/2017, 3:01 PM

## 2017-09-26 NOTE — Clinical Social Work Placement (Signed)
   CLINICAL SOCIAL WORK PLACEMENT  NOTE [place]  RN to call report to [phone]  Date:  09/26/2017  Patient Details  Name: Derek Hays MRN: [id number] Date of Birth: 1930/11/04  Clinical Social Work is seeking post-discharge placement for this patient at the [place] level of care (*CSW will initial, date and re-position this form in  chart as items are completed):      Patient/family provided with [facility] Work Department's list of facilities offering this level of care within the geographic area requested by the patient (or if unable, by the patient's family).  Yes   Patient/family informed of their freedom to choose among providers that offer the needed level of care, that participate in Medicare, Medicaid or managed care program needed by the patient, have an available bed and are willing to accept the patient.      Patient/family informed of [facility]'s ownership interest in [facility] and [facility], as well as of the fact that they are under no obligation to receive care at these facilities.  PASRR submitted to EDS on       PASRR number received on       Existing PASRR number confirmed on 09/24/17     FL2 transmitted to all facilities in geographic area requested by pt/family on 09/24/17     FL2 transmitted to all facilities within larger geographic area on       Patient informed that his/her managed care company has contracts with or will negotiate with certain facilities, including the following:        Yes   Patient/family informed of bed offers received.  Patient chooses bed at [facility]     Physician recommends and patient chooses bed at      Patient to be transferred to [facility] on 09/26/17.  Patient to be transferred to facility by [facility]     Patient family notified on 09/26/17 of transfer.  Name of family member notified:  wife [place] via telephone     PHYSICIAN        Additional Comment:    _______________________________________________ Alexander Mt, [place] 09/26/2017, 11:32 AM

## 2017-09-26 NOTE — Discharge Summary (Signed)
Physician Discharge Summary  Derek Hays XHB:[id number] DOB: 11-03-1930 DOA: 09/23/2017  PCP: Patient, No Pcp Per  Admit date: 09/23/2017 Discharge date: 09/26/2017  Time spent: 25 minutes  Recommendations for Outpatient Follow-up:  1. Continue Augmentin twice daily for 7 days   Discharge Diagnoses:  Principal Problem:   Altered mental status Active Problems:   Right middle cerebral artery stroke (HCC)   Stage 3 chronic kidney disease (HCC)   PAF (paroxysmal atrial fibrillation) (HCC)   Benign essential HTN   Dysphagia, post-stroke   HCAP (healthcare-associated pneumonia)   Urinary tract infection   Acute respiratory failure with hypoxia (HCC)   Oropharyngeal dysphagia   Discharge Condition: Stable  Diet recommendation: Heart healthy diet  Filed Weights   09/23/17 1508  Weight: 92.5 kg (204 lb)    History of present illness:  84 y.o.malewith medical history significantfor HTN, PAF, bladder cancer, andrecent embolic R MCACVA with residual L hemiparesis who was brought to the ED today by EMS after facility reported pt had decreased responsiveness as well as a drop in his pulse ox to 72%. By the time he got to the ED he was "perfectly coherent" according to his daughter. He knows where he is, what happened. Dtr states he doesn't like the water at the facility so he avoids drinking, and is likely dehydrated. He was admitted in late April for a UTI and pneumonia associated with AMS.He does have known dysphagia following his CVA and had FEES during his hospitalization at the time of his stroke that indicated oropharyngeal dysphagia, with moderate aspiration risk. He was supposed to be on a thickened liquid diet but per the daughter, his liquids are not being thickened at the rehab where he's been. He was scheduled for a repeat FEES at the [facility] tomorrow. His daughter who provides most of the history states that he has seemed well up until the episode today, with no recent changes  in his baseline     Hospital Course:   1. Altered mental status-resolved, likely from metabolic encephalopathy due to underlying infection.  At this time patient is alert oriented x3. 2. Healthcare associated pneumonia-improving patient was started on antibiotics  vancomycin and cefepime strep urinary antigen is negative.  Blood cultures 1 out of 2 bottles grew coagulase negative staph, likley contaminent.  Vancomycin has been discontinued. Discharge on Augmentin 875.125 mg po bid for seven more days. 3. UTI - urine culture growing E. coli-, 80,000 colonies per mL, sensitive to ampicillin / sulbactam, will discharge on Augmentin for seven days,  Stop after 10/03/17 4. Hypothyroidism-continue Synthroid 5. Acute hypoxic episode-resolved, likely from pneumonia.  Continue antibiotics as above.  Albuterol nebulizer every 6 hours as needed. 6. Recent embolic CVA with residual left hemiparesis, dysphagia-continue Eliquis as tolerated speech therapy saw the patient started on regular solids with thin liquid 7. Paroxysmal atrial fibrillation-continue Eliquis 8. Hypertension- continue Imdur, amlodipine. 9. Chronic kidney disease stage III-creatinine at baseline, avoid nephrotoxins.    Procedures:  None    Consultations:  None   Discharge Exam: Vitals:   09/25/17 2139 09/26/17 0542  BP: (!) 126/97 (!) 151/111  Pulse: 63 (!) 53  Resp: 18 17  Temp: 98.8 F (37.1 C) 98.7 F (37.1 C)  SpO2: 100% 100%    General: Appears in no acute distress Cardiovascular: S1S2 RRR Respiratory: Clear bilaterally  Discharge Instructions    Allergies as of 09/26/2017   No Known Allergies     Medication List    STOP taking these  medications   GERI-TUSSIN DM 10-100 MG/5ML liquid Generic drug:  Dextromethorphan-guaiFENesin     TAKE these medications   acetaminophen 500 MG tablet Commonly known as:  TYLENOL Take 500-1,000 mg by mouth every 4 (four) hours as needed for headache (pain).    albuterol (2.5 MG/3ML) 0.083% nebulizer solution Commonly known as:  PROVENTIL Take 3 mLs (2.5 mg total) by nebulization every 6 (six) hours. What changed:    when to take this  reasons to take this  Another medication with the same name was removed. Continue taking this medication, and follow the directions you see here.   amLODipine 10 MG tablet Commonly known as:  NORVASC Take 1 tablet (10 mg total) by mouth daily.   amoxicillin-clavulanate 875-125 MG tablet Commonly known as:  AUGMENTIN Take 1 tablet by mouth 2 (two) times daily for 7 days.   apixaban 5 MG Tabs tablet Commonly known as:  ELIQUIS Take 1 tablet (5 mg total) by mouth 2 (two) times daily.   atorvastatin 40 MG tablet Commonly known as:  LIPITOR Take 1 tablet (40 mg total) by mouth daily at 6 PM.   benzonatate 100 MG capsule Commonly known as:  TESSALON Take 200 mg by mouth 3 (three) times daily as needed for cough.   bethanechol 25 MG tablet Commonly known as:  URECHOLINE Take 1 tablet (25 mg total) by mouth 3 (three) times daily. What changed:  additional instructions   levothyroxine 125 MCG tablet Commonly known as:  SYNTHROID, LEVOTHROID Take 125 mcg by mouth daily.   meclizine 12.5 MG tablet Commonly known as:  ANTIVERT Take 12.5 mg by mouth daily as needed for nausea.   pregabalin 75 MG capsule Commonly known as:  LYRICA Take 75 mg by mouth 2 (two) times daily. What changed:  Another medication with the same name was removed. Continue taking this medication, and follow the directions you see here.   REFRESH TEARS 0.5 % Soln Generic drug:  carboxymethylcellulose Place 1 drop into both eyes every 6 (six) hours as needed (dry eyes). What changed:  Another medication with the same name was removed. Continue taking this medication, and follow the directions you see here.   SENNA-PLUS 8.6-50 MG tablet Generic drug:  senna-docusate Take 2 tablets by mouth 2 (two) times daily.   tamsulosin 0.4  MG Caps capsule Commonly known as:  FLOMAX Take 1 capsule (0.4 mg total) by mouth daily after supper. What changed:  when to take this   timolol 0.5 % ophthalmic solution Commonly known as:  TIMOPTIC Place 1 drop into the left eye daily.   XALATAN 0.005 % ophthalmic solution Generic drug:  latanoprost Place 1 drop into both eyes at bedtime.      No Known Allergies    The results of significant diagnostics from this hospitalization (including imaging, microbiology, ancillary and laboratory) are listed below for reference.    Significant Diagnostic Studies: Ct Head Wo Contrast  Result Date: 09/23/2017 CLINICAL DATA:  Altered mental status and dyspnea since last night. EXAM: CT HEAD WITHOUT CONTRAST TECHNIQUE: Contiguous axial images were obtained from the base of the skull through the vertex without intravenous contrast. COMPARISON:  08/10/2017 FINDINGS: Brain: Examination demonstrates evidence of an old large right MCA territory infarct. Also old left PCA territory infarct. Small old lacune infarct over the left basal ganglia. No mass, mass effect, shift of midline structures or acute hemorrhage. No evidence of acute infarction. Chronic ischemic microvascular disease. Vascular: No hyperdense vessel or unexpected calcification. Skull:  Normal. Negative for fracture or focal lesion. Sinuses/Orbits: Sinuses are well aerated as there is mild mucosal membrane thickening over the floor the left maxillary sinus. Mastoid air cells are clear. Orbits are normal. Other: None. IMPRESSION: No acute findings. Old large right MCA territory infarct. Small left PCA territory infarct and old lacunar infarct over the left basal ganglia. Mild chronic ischemic microvascular disease. Electronically Signed   By: Marin Olp M.D.   On: 09/23/2017 17:13   Dg Chest Port 1 View  Result Date: 09/23/2017 CLINICAL DATA:  Productive cough for 2 days EXAM: PORTABLE CHEST 1 VIEW COMPARISON:  08/11/2017 FINDINGS: Cardiac  shadow is stable. Pacing device is again seen and stable. Aortic calcifications are noted. The lungs are well aerated bilaterally some persistent but mildly improved infiltrative changes noted in the right base. No bony abnormality is seen. IMPRESSION: Persistent but improved infiltrative changes in the right base. Electronically Signed   By: Inez Catalina M.D.   On: 09/23/2017 10:56   Dg Swallowing Func-speech Pathology  Result Date: 09/24/2017 Objective Swallowing Evaluation: Type of Study: MBS-Modified Barium Swallow Study  Patient Details Name: Derek Hays MRN: [id number] Date of Birth: June 30, 1930 Today's Date: 09/24/2017 Time: SLP Start Time (ACUTE ONLY): 1340 -SLP Stop Time (ACUTE ONLY): 1410 SLP Time Calculation (min) (ACUTE ONLY): 30 min Past Medical History: Past Medical History: Diagnosis Date . Atrial fibrillation ([place])  . Bladder cancer ([place]) 1984 . Hypertension  . Stroke [facility])  Past Surgical History: Past Surgical History: Procedure Laterality Date . IR CT HEAD LTD  05/27/2017 . IR PERCUTANEOUS ART THROMBECTOMY/INFUSION INTRACRANIAL INC DIAG ANGIO  05/27/2017 . PACEMAKER INSERTION   . RADIOLOGY WITH ANESTHESIA N/A 05/27/2017  Procedure: RADIOLOGY WITH ANESTHESIA;  Surgeon: Luanne Bras, MD;  Location: [place];  Service: Radiology;  Laterality: N/A; HPI: 84 y.o. male with medical history significant for HTN, PAF, bladder cancer, andembolic R MCA CVA Feb 1914 with residual L hemiparesis who was brought to the ED 6/09/19by EMS after facility reported pt had decreased responsiveness as well as a drop in his pulse ox to 72%.  Dx HCAP.  Pt has hx of dysphagia.  Has been followed by SLP services during prior acute admissions and s/p CVA in CIR.  Has had several MBS studies.  When last evaluated in 08-14-2017, diet recommendations were for dysphagia 2, nectar thick liquids.  According to H&P, pt was scheduled for f/u FEES through [place] this week; he has been drinking thin liquids at the SNF.  Subjective:  alert, good historian Assessment / Plan / Recommendation CHL IP CLINICAL IMPRESSIONS 09/24/2017 Clinical Impression Pt presents with improved swallow function since time of last study in Feb 2019.  Primary deficits are oral, with ongoing sensory loss leading to left buccal pocketing and anterior bolus loss, as well as prolonged mastication.  Pharyngeal function was improved, with no penetration nor aspiration, and improved pharyngeal clearance,  There continues to be mild pyriform residue as a result of narrowed UES/presence of osteophytes that impinge upon posterior pharyngeal/esophageal column.  Recommend advancing diet to thin liquids; continue heart healthy regular solids per pt's preferences.   SLP Visit Diagnosis Dysphagia, oral phase (R13.11) Attention and concentration deficit following -- Frontal lobe and executive function deficit following -- Impact on safety and function --   CHL IP TREATMENT RECOMMENDATION 09/24/2017 Treatment Recommendations Defer treatment plan to f/u with SLP   Prognosis 08/11/2017 Prognosis for Safe Diet Advancement Good Barriers to Reach Goals -- Barriers/Prognosis Comment -- CHL IP  DIET RECOMMENDATION 09/24/2017 SLP Diet Recommendations Regular solids;Thin liquid Liquid Administration via Cup;Straw Medication Administration Whole meds with liquid Compensations Minimize environmental distractions Postural Changes --   CHL IP OTHER RECOMMENDATIONS 09/24/2017 Recommended Consults -- Oral Care Recommendations Oral care BID Other Recommendations --   CHL IP FOLLOW UP RECOMMENDATIONS 08/13/2017 Follow up Recommendations Skilled Nursing facility   [facility] [address] IP FREQUENCY AND DURATION 08/11/2017 Speech Therapy Frequency (ACUTE ONLY) min 2x/week Treatment Duration 2 weeks      CHL IP ORAL PHASE 09/24/2017 Oral Phase Impaired Oral - Pudding Teaspoon -- Oral - Pudding Cup -- Oral - Honey Teaspoon -- Oral - Honey Cup -- Oral - Nectar Teaspoon -- Oral - Nectar Cup -- Oral - Nectar Straw -- Oral - Thin  Teaspoon -- Oral - Thin Cup -- Oral - Thin Straw Left anterior bolus loss;Weak lingual manipulation;Lingual pumping;Delayed oral transit;Decreased bolus cohesion Oral - Puree Weak lingual manipulation;Lingual pumping;Delayed oral transit;Decreased bolus cohesion Oral - Mech Soft -- Oral - Regular Weak lingual manipulation;Lingual pumping;Delayed oral transit;Decreased bolus cohesion Oral - Multi-Consistency -- Oral - Pill -- Oral Phase - Comment --  CHL IP PHARYNGEAL PHASE 09/24/2017 Pharyngeal Phase WFL Pharyngeal- Pudding Teaspoon -- Pharyngeal -- Pharyngeal- Pudding Cup -- Pharyngeal -- Pharyngeal- Honey Teaspoon -- Pharyngeal -- Pharyngeal- Honey Cup -- Pharyngeal -- Pharyngeal- Nectar Teaspoon -- Pharyngeal -- Pharyngeal- Nectar Cup -- Pharyngeal -- Pharyngeal- Nectar Straw -- Pharyngeal -- Pharyngeal- Thin Teaspoon -- Pharyngeal -- Pharyngeal- Thin Cup -- Pharyngeal -- Pharyngeal- Thin Straw -- Pharyngeal -- Pharyngeal- Puree -- Pharyngeal -- Pharyngeal- Mechanical Soft -- Pharyngeal -- Pharyngeal- Regular -- Pharyngeal -- Pharyngeal- Multi-consistency -- Pharyngeal -- Pharyngeal- Pill -- Pharyngeal -- Pharyngeal Comment --  CHL IP CERVICAL ESOPHAGEAL PHASE 09/24/2017 Cervical Esophageal Phase WFL Pudding Teaspoon -- Pudding Cup -- Honey Teaspoon -- Honey Cup -- Nectar Teaspoon -- Nectar Cup -- Nectar Straw -- Thin Teaspoon -- Thin Cup -- Thin Straw -- Puree -- Mechanical Soft -- Regular -- Multi-consistency -- Pill -- Cervical Esophageal Comment -- No flowsheet data found. Juan Quam Laurice 09/24/2017, 2:46 PM               Microbiology: Recent Results (from the past 240 hour(s))  Culture, blood (routine x 2) Call MD if unable to obtain prior to antibiotics being given     Status: None (Preliminary result)   Collection Time: 09/23/17  8:30 PM  Result Value Ref Range Status   Specimen Description BLOOD LEFT HAND  Final   Special Requests   Final    BOTTLES DRAWN AEROBIC ONLY Blood Culture results  may not be optimal due to an inadequate volume of blood received in culture bottles   Culture   Final    NO GROWTH 1 DAY Performed at [facility], [place] [address], [place], [place] [address]    Report Status PENDING  Incomplete  Culture, blood (routine x 2) Call MD if unable to obtain prior to antibiotics being given     Status: None (Preliminary result)   Collection Time: 09/23/17  8:35 PM  Result Value Ref Range Status   Specimen Description BLOOD LEFT HAND  Final   Special Requests   Final    BOTTLES DRAWN AEROBIC ONLY Blood Culture results may not be optimal due to an inadequate volume of blood received in culture bottles   Culture  Setup Time   Final    GRAM POSITIVE COCCI AEROBIC BOTTLE ONLY CRITICAL RESULT CALLED TO, READ BACK BY AND VERIFIED  WITH: Shellee Milo [facility] 09/25/17 2337 JDW    Culture   Final    GRAM POSITIVE COCCI CULTURE REINCUBATED FOR BETTER GROWTH Performed at [facility], [place] [address] [place], [place] [address]    Report Status PENDING  Incomplete  Blood Culture ID Panel (Reflexed)     Status: Abnormal   Collection Time: 09/23/17  8:35 PM  Result Value Ref Range Status   Enterococcus species NOT DETECTED NOT DETECTED Final   Listeria monocytogenes NOT DETECTED NOT DETECTED Final   Staphylococcus species DETECTED (A) NOT DETECTED Final    Comment: Methicillin (oxacillin) resistant coagulase negative staphylococcus. Possible blood culture contaminant (unless isolated from more than one blood culture draw or clinical case suggests pathogenicity). No antibiotic treatment is indicated for blood  culture contaminants. CRITICAL RESULT CALLED TO, READ BACK BY AND VERIFIED WITH: L SEAY PHARMD 09/25/17 2337 JDW    Staphylococcus aureus NOT DETECTED NOT DETECTED Final   Methicillin resistance DETECTED (A) NOT DETECTED Final    Comment: CRITICAL RESULT CALLED TO, READ BACK BY AND VERIFIED WITH: L SEAY PHARMD 09/25/17 2337 JDW    Streptococcus species NOT  DETECTED NOT DETECTED Final   Streptococcus agalactiae NOT DETECTED NOT DETECTED Final   Streptococcus pneumoniae NOT DETECTED NOT DETECTED Final   Streptococcus pyogenes NOT DETECTED NOT DETECTED Final   Acinetobacter baumannii NOT DETECTED NOT DETECTED Final   Enterobacteriaceae species NOT DETECTED NOT DETECTED Final   Enterobacter cloacae complex NOT DETECTED NOT DETECTED Final   Escherichia coli NOT DETECTED NOT DETECTED Final   Klebsiella oxytoca NOT DETECTED NOT DETECTED Final   Klebsiella pneumoniae NOT DETECTED NOT DETECTED Final   Proteus species NOT DETECTED NOT DETECTED Final   Serratia marcescens NOT DETECTED NOT DETECTED Final   Haemophilus influenzae NOT DETECTED NOT DETECTED Final   Neisseria meningitidis NOT DETECTED NOT DETECTED Final   Pseudomonas aeruginosa NOT DETECTED NOT DETECTED Final   Candida albicans NOT DETECTED NOT DETECTED Final   Candida glabrata NOT DETECTED NOT DETECTED Final   Candida krusei NOT DETECTED NOT DETECTED Final   Candida parapsilosis NOT DETECTED NOT DETECTED Final   Candida tropicalis NOT DETECTED NOT DETECTED Final    Comment: Performed at [facility], [place]. [address], [place], [place] [address]  Urine culture     Status: Abnormal   Collection Time: 09/23/17 10:52 PM  Result Value Ref Range Status   Specimen Description URINE, CLEAN CATCH  Final   Special Requests   Final    vanc, cefepime Normal Performed at [facility], [place] [address], [place], [place] [address]    Culture 80,000 COLONIES/mL ESCHERICHIA COLI (A)  Final   Report Status 09/26/2017 FINAL  Final   Organism ID, Bacteria ESCHERICHIA COLI (A)  Final      Susceptibility   Escherichia coli - MIC*    AMPICILLIN 8 SENSITIVE Sensitive     CEFAZOLIN <=4 SENSITIVE Sensitive     CEFTRIAXONE <=1 SENSITIVE Sensitive     CIPROFLOXACIN >=4 RESISTANT Resistant     GENTAMICIN <=1 SENSITIVE Sensitive     IMIPENEM <=0.25 SENSITIVE Sensitive     NITROFURANTOIN 32  SENSITIVE Sensitive     TRIMETH/SULFA <=20 SENSITIVE Sensitive     AMPICILLIN/SULBACTAM 4 SENSITIVE Sensitive     PIP/TAZO <=4 SENSITIVE Sensitive     Extended ESBL NEGATIVE Sensitive     * 80,000 COLONIES/mL ESCHERICHIA COLI  MRSA PCR Screening     Status: None   Collection  Time: 09/24/17  5:42 AM  Result Value Ref Range Status   MRSA by PCR NEGATIVE NEGATIVE Final    Comment:        The GeneXpert MRSA Assay (FDA approved for NASAL specimens only), is one component of a comprehensive MRSA colonization surveillance program. It is not intended to diagnose MRSA infection nor to guide or monitor treatment for MRSA infections. Performed at [facility], [place] [address] [place], [place] [address]      Labs: Basic Metabolic Panel: Recent Labs  Lab 09/23/17 1138 09/24/17 0527 09/25/17 0417  NA 142 141 141  K 3.0* 4.1 3.5  CL 109 109 107  CO2 22 24 26   GLUCOSE 104* 203* 94  BUN 15 21* 19  CREATININE 1.36* 1.19 0.93  CALCIUM 7.8* 8.3* 8.3*   Liver Function Tests: Recent Labs  Lab 09/25/17 0417  AST 18  ALT 10*  ALKPHOS 55  BILITOT 0.8  PROT 5.4*  ALBUMIN 2.2*   No results for input(s): LIPASE, AMYLASE in the last 168 hours. No results for input(s): AMMONIA in the last 168 hours. CBC: Recent Labs  Lab 09/23/17 1001 09/24/17 0527  WBC 8.8 7.5  NEUTROABS 6.8  --   HGB 12.6* 10.6*  HCT 39.8 32.9*  MCV 86.7 84.6  PLT 182 159   Cardiac Enzymes: No results for input(s): CKTOTAL, CKMB, CKMBINDEX, TROPONINI in the last 168 hours. BNP: BNP (last 3 results) No results for input(s): BNP in the last 8760 hours.  ProBNP (last 3 results) No results for input(s): PROBNP in the last 8760 hours.  CBG: No results for input(s): GLUCAP in the last 168 hours.     Signed:  Oswald Hillock MD.  [facility] 09/26/2017, 10:19 AM

## 2017-09-26 NOTE — Progress Notes (Signed)
Called report to [facility], paperwork together for facility. Pt to return to his old room.  Report given to nurse that sent him here to our facility.

## 2017-09-26 NOTE — Progress Notes (Signed)
PHARMACY - PHYSICIAN COMMUNICATION CRITICAL VALUE ALERT - BLOOD CULTURE IDENTIFICATION (BCID)  Derek Hays is an 84 y.o. male who presented to [facility] on 09/23/2017 with a chief complaint of AMS  Assessment:    Name of physician (or Provider) Contacted: Text paged K Schorr Current antibiotics: cefepime  Changes to prescribed antibiotics recommended:  None.  Consider deescalation as warranted  Results for orders placed or performed during the hospital encounter of 09/23/17  Blood Culture ID Panel (Reflexed) (Collected: 09/23/2017  8:35 PM)  Result Value Ref Range   Enterococcus species NOT DETECTED NOT DETECTED   Listeria monocytogenes NOT DETECTED NOT DETECTED   Staphylococcus species DETECTED (A) NOT DETECTED   Staphylococcus aureus NOT DETECTED NOT DETECTED   Methicillin resistance DETECTED (A) NOT DETECTED   Streptococcus species NOT DETECTED NOT DETECTED   Streptococcus agalactiae NOT DETECTED NOT DETECTED   Streptococcus pneumoniae NOT DETECTED NOT DETECTED   Streptococcus pyogenes NOT DETECTED NOT DETECTED   Acinetobacter baumannii NOT DETECTED NOT DETECTED   Enterobacteriaceae species NOT DETECTED NOT DETECTED   Enterobacter cloacae complex NOT DETECTED NOT DETECTED   Escherichia coli NOT DETECTED NOT DETECTED   Klebsiella oxytoca NOT DETECTED NOT DETECTED   Klebsiella pneumoniae NOT DETECTED NOT DETECTED   Proteus species NOT DETECTED NOT DETECTED   Serratia marcescens NOT DETECTED NOT DETECTED   Haemophilus influenzae NOT DETECTED NOT DETECTED   Neisseria meningitidis NOT DETECTED NOT DETECTED   Pseudomonas aeruginosa NOT DETECTED NOT DETECTED   Candida albicans NOT DETECTED NOT DETECTED   Candida glabrata NOT DETECTED NOT DETECTED   Candida krusei NOT DETECTED NOT DETECTED   Candida parapsilosis NOT DETECTED NOT DETECTED   Candida tropicalis NOT DETECTED NOT DETECTED    Beverlee Nims 09/26/2017  12:36 AM

## 2017-09-26 NOTE — Progress Notes (Signed)
Report called to facility

## 2017-09-27 ENCOUNTER — Encounter: Payer: Self-pay | Admitting: Internal Medicine

## 2017-09-29 LAB — CULTURE, BLOOD (ROUTINE X 2): Culture: NO GROWTH

## 2017-10-01 DIAGNOSIS — J189 Pneumonia, unspecified organism: Secondary | ICD-10-CM | POA: Diagnosis not present

## 2017-10-01 DIAGNOSIS — I69354 Hemiplegia and hemiparesis following cerebral infarction affecting left non-dominant side: Secondary | ICD-10-CM | POA: Diagnosis not present

## 2017-10-01 DIAGNOSIS — I480 Paroxysmal atrial fibrillation: Secondary | ICD-10-CM | POA: Diagnosis not present

## 2017-10-01 DIAGNOSIS — I69315 Cognitive social or emotional deficit following cerebral infarction: Secondary | ICD-10-CM | POA: Diagnosis not present

## 2017-10-04 ENCOUNTER — Ambulatory Visit (HOSPITAL_BASED_OUTPATIENT_CLINIC_OR_DEPARTMENT_OTHER): Payer: Medicare Other | Admitting: Physical Medicine & Rehabilitation

## 2017-10-04 ENCOUNTER — Encounter: Payer: Medicare Other | Attending: Physical Medicine & Rehabilitation

## 2017-10-04 ENCOUNTER — Encounter: Payer: Self-pay | Admitting: Physical Medicine & Rehabilitation

## 2017-10-04 ENCOUNTER — Other Ambulatory Visit: Payer: Self-pay

## 2017-10-04 VITALS — BP 121/79 | HR 60

## 2017-10-04 DIAGNOSIS — I4891 Unspecified atrial fibrillation: Secondary | ICD-10-CM | POA: Insufficient documentation

## 2017-10-04 DIAGNOSIS — M25512 Pain in left shoulder: Secondary | ICD-10-CM | POA: Insufficient documentation

## 2017-10-04 DIAGNOSIS — I69354 Hemiplegia and hemiparesis following cerebral infarction affecting left non-dominant side: Secondary | ICD-10-CM | POA: Insufficient documentation

## 2017-10-04 DIAGNOSIS — Z87891 Personal history of nicotine dependence: Secondary | ICD-10-CM | POA: Diagnosis not present

## 2017-10-04 DIAGNOSIS — Z950 Presence of cardiac pacemaker: Secondary | ICD-10-CM | POA: Insufficient documentation

## 2017-10-04 DIAGNOSIS — I10 Essential (primary) hypertension: Secondary | ICD-10-CM | POA: Insufficient documentation

## 2017-10-04 DIAGNOSIS — R600 Localized edema: Secondary | ICD-10-CM | POA: Insufficient documentation

## 2017-10-04 DIAGNOSIS — I639 Cerebral infarction, unspecified: Secondary | ICD-10-CM

## 2017-10-04 NOTE — Patient Instructions (Addendum)
Make sure you see primary MD

## 2017-10-04 NOTE — Progress Notes (Signed)
Subjective:    Patient ID: Derek Hays, male    DOB: 1930/11/13, 84 y.o.   MRN: [id number] 84 year old right-handed male with history of hypertension, atrial fibrillation with pacemaker had been maintained on Eliquis, discontinued due to traumatic subdural hematoma in September 2018.  Repeat head CT scan on May 10, 2017, showed SDH completely reabsorbed, followup by Dr. Bridgette Habermann at [facility].  He lives with spouse, independent with a cane and a walker prior to admission.  Presented on May 27, 2017, with acute onset of left-sided weakness and slurred speech.  CT of the head showed hyperdense right MCA.  No hemorrhage or mass effect.  Subacute appearing left PCA territory infarct.  CT cerebral perfusion scan, emergent large vessel occlusion of the distal M1 segment of the right middle cerebral artery, underwent revascularization.  Echocardiogram with ejection fraction of 76%, grade 2 diastolic dysfunction.  MRI and MRA, acute MCA infarction, right insula, and frontal parietal lobe.  Small acute subacute infarct, left parietal lobe.  MRA with revascularization of occlusion.  Maintained on aspirin and Eliquis.   He was on a dysphagia #1 thin thick liquid diet.  The patient was admitted for a comprehensive rehab program.  HPI  After completed CIR inpt rehab the pt was d/ced to SNF Interval hx admitted to [facility] on 09/23/17  for UTI and AMS.  Received IV abx and D/Ced back to SNF Home from [facility] 6/18.  HH from [place], [place], RN, PT will come out this pm Wife has no hired caregiviers.  She dressed him this am Hoyer lift at home.   Lucianne Lei Transport to clinic  Passed FEES while hospitalized at [facility] but cont to have poor appetite.  No bowel issues except incontinence, pt states the food doesn't taste as good as prior to CVA Sense of taste improving however.  Pain Inventory Average Pain 0 Pain Right Now 0 My pain is na  In the last 24 hours, has pain interfered with  the following? General activity 0 Relation with others 0 Enjoyment of life na What TIME of day is your pain at its worst? na Sleep (in general) NA  Pain is worse with: na Pain improves with: na Relief from Meds: na  Mobility use a wheelchair needs help with transfers  Function disabled: date disabled 05/27/17 retired I need assistance with the following:  feeding, dressing, bathing, toileting, meal prep, household duties and shopping  Neuro/Psych bladder control problems trouble walking  Prior Studies Any changes since last visit?  yes  Hospital (ED)  Physicians involved in your care Any changes since last visit?  no   Family History  Problem Relation Age of Onset  . Emphysema Father    Social History   Socioeconomic History  . Marital status: Married    Spouse name: Not on file  . Number of children: Not on file  . Years of education: Not on file  . Highest education level: Not on file  Occupational History    Employer: [employer]  Social Needs  . Financial resource strain: Not on file  . Food insecurity:    Worry: Not on file    Inability: Not on file  . Transportation needs:    Medical: Not on file    Non-medical: Not on file  Tobacco Use  . Smoking status: Former Smoker    Packs/day: 1.50    Years: 25.00    Pack years: 37.50    Types: Cigarettes    Last  attempt to quit: 06/12/1982    Years since quitting: 35.3  . Smokeless tobacco: Never Used  . Tobacco comment: pt states he quit when dx with bladder ca  Substance and Sexual Activity  . Alcohol use: Not Currently  . Drug use: No  . Sexual activity: Not on file  Lifestyle  . Physical activity:    Days per week: Not on file    Minutes per session: Not on file  . Stress: To some extent  Relationships  . Social connections:    Talks on phone: More than three times a week    Gets together: Twice a week    Attends religious service: Not on file    Active member of club or organization: Not on file     Attends meetings of clubs or organizations: Never    Relationship status: Not on file  Other Topics Concern  . Not on file  Social History Narrative  . Not on file   Past Surgical History:  Procedure Laterality Date  . IR CT HEAD LTD  05/27/2017  . IR PERCUTANEOUS ART THROMBECTOMY/INFUSION INTRACRANIAL INC DIAG ANGIO  05/27/2017  . PACEMAKER INSERTION    . RADIOLOGY WITH ANESTHESIA N/A 05/27/2017   Procedure: RADIOLOGY WITH ANESTHESIA;  Surgeon: Luanne Bras, MD;  Location: [place];  Service: Radiology;  Laterality: N/A;   Past Medical History:  Diagnosis Date  . Atrial fibrillation ([place])   . Bladder cancer ([place]) 1984  . Hypertension   . Stroke ([place])    BP 121/79   Pulse 60   SpO2 94%   Opioid Risk Score:   Fall Risk Score:  `1  Depression screen PHQ 2/9  Depression screen [facility] 2/9 10/04/2017 08/22/2017  Decreased Interest 0 (No Data)  Down, Depressed, Hopeless 0 (No Data)  PHQ - 2 Score 0 -    Review of Systems  Constitutional: Positive for unexpected weight change.  HENT: Negative.   Eyes: Negative.   Respiratory: Negative.   Cardiovascular: Positive for leg swelling.  Genitourinary:       Bladder control  Musculoskeletal: Positive for gait problem.  Skin: Negative.   Allergic/Immunologic: Negative.   Neurological: Positive for weakness.  Hematological: Negative.   Psychiatric/Behavioral: Negative.   All other systems reviewed and are negative.      Objective:   Physical Exam  Constitutional: He is oriented to person, place, and time. He appears well-developed and well-nourished. No distress.  HENT:  Head: Normocephalic and atraumatic.  Eyes: Pupils are equal, round, and reactive to light. EOM are normal.  Neck: Neck supple.  Neurological: He is alert and oriented to person, place, and time. A sensory deficit is present. Coordination and gait abnormal.  Speech is mildly dysarthriac, responses are delayed Non ambulatory Sensation reduced to Light  touch in LUE , intact LLE  Skin: Skin is warm and dry. He is not diaphoretic.   Left facial droop partially improved.   Left shoulder shrug 3- Left triceps 2-, biceps trace 0/5 finger flexion and ext  2- Left hip/knee ext synergy Sensation reduced LUE Tone is Flaccid in LUE  + dorsal hand edema    Assessment & Plan:  1.  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  Doubt if pt is a candidate for Outpt due to transportation difficulties  2.  Dysphagia related to stroke improving, on regular diet, appetite slowly improving, HHSLP  3.  Dysarthria and  facial droop improving , now intelligible HHSLP  4. Left dorsum hand edema due to immobility- retrograde massage instructed

## 2017-10-07 ENCOUNTER — Ambulatory Visit (INDEPENDENT_AMBULATORY_CARE_PROVIDER_SITE_OTHER): Payer: Medicare Other | Admitting: Adult Health

## 2017-10-07 ENCOUNTER — Encounter: Payer: Self-pay | Admitting: Adult Health

## 2017-10-07 VITALS — BP 123/76 | HR 60 | Ht 73.0 in

## 2017-10-07 DIAGNOSIS — E785 Hyperlipidemia, unspecified: Secondary | ICD-10-CM | POA: Diagnosis not present

## 2017-10-07 DIAGNOSIS — I63511 Cerebral infarction due to unspecified occlusion or stenosis of right middle cerebral artery: Secondary | ICD-10-CM | POA: Diagnosis not present

## 2017-10-07 DIAGNOSIS — I10 Essential (primary) hypertension: Secondary | ICD-10-CM | POA: Diagnosis not present

## 2017-10-07 DIAGNOSIS — I480 Paroxysmal atrial fibrillation: Secondary | ICD-10-CM | POA: Diagnosis not present

## 2017-10-07 NOTE — Progress Notes (Signed)
[facility] [address] [place]. [place] [address] [phone]       OFFICE FOLLOW UP NOTE  [name] Date of Birth:  1930/09/15 Medical Record Number:  [id number]   Reason for Referral:  hospital stroke follow up  CHIEF COMPLAINT:  Chief Complaint  Patient presents with  . Follow-up    Stroke follow up pt is in room 9 with [facility] wife, pt is at home with wife , pt has CNA, and therapy. The wife use a life to get him in the wheelchair. Also she has a  Printmaker to accomodate the wheelchair.    HPI: Derek Hays is being seen today for initial visit in the office for right MCA, left MCA and left PCA infarcts on 05/27/17. History obtained from paitent and chart review. Reviewed all radiology images and labs personally.  Derek Hays is an 84 year old male history of A. fib status post pacemaker not on AC due to recent SDH, hypertension admitted for acute onset of left-sided weakness and right gaze on 05/27/2017.  No TPA was given due to left PCA in the right MCA hypodensity on CT scan.  CTA showed emergent large vessel occlusion in the distal M1 segment of the right MCA.  Patient underwent thrombectomy on 05/27/2017 with occluded right MCA which was tolerated well without any complications.  MRI brain showed acute right MCA infarct involving the right insula and frontal parietal lobe along with a small acute or subacute infarct in left parietal lobe, subacute infarct in left occipital lobe.  MRA showed persistent right M2 high-grade stenosis status post endovascular treatment.  2D echo showed EF of 60 to 65% and negative for cardiac source of emboli.  LDL 77 and A1c 5.4.  As previous traumatic SDH resolved, it was recommended that patient restart Eliquis 10 days post stroke due to large size of infarct.  Resumed PTA medication Zocor 40 mg.Patient discharged to CIR in stable condition.  08/05/17 visit: Since discharge, patient has been experiencing a slow recovery.  He is  currently at [facility] where he receives PT/OT/ST. Wife is concerned that he is not receiving adequate therapy sessions as he has not been making progress.  He continues to have moderate left hemiparesis and is currently sitting in a wheelchair due to being unable to ambulate.  Also has complaints of left leg pain and describes this as a burning and shooting sensation. Currently taking Lyrica 100 mg twice daily.  Continues to take Eliquis without side effects of bleeding or bruising.  Continues to take Zocor without side effects of myalgias.  Blood pressure mildly elevated at 159/95.  Per wife, his blood pressure is typically lower at the facility.  Denies new or worsening stroke/TIA symptoms.  10/07/17 UPDATE: Recent hospital admission on 09/23/17 who presented with altered mental status likely from UTI and treated on antibiotic therapy.  Patient returned home from SNF on 10/01/2017 and has home health through [facility] where he receives a nurse, occupational,  and physical therapy.   Patient returns today for 25-month follow-up visit and is accompanied by his wife.  As he is currently living at home, wife was contacted by home health agency on Friday and they do have an appointment this afternoon for nursing assistance and will be soon having PT/OT.  Patient is currently sitting in wheelchair due to left hemiplegia.  Continues to take Eliquis with side effects of minor bruising but no bleeding.  At previous visit.  Patient was on Zocor  but during hospital admission on 08/10/2017 patient was stopped on simvastatin and started on atorvastatin due to patient being started on Levaquin and potential for increased muscle cramps.  Patient has been tolerating Lipitor well without side effects of myalgias.  Blood pressure satisfactory at 123/76.  Wife is currently his main caregiver as she base him in the morning, dresses him in the morning and uses Hoyer lift in order to place him in his wheelchair out of bed or vice  versa.  This has been difficult on his wife and will definitely benefit from home health nursing and therapies.  Denies new or worsening stroke/TIA symptoms.   ROS:   14 system review of systems performed and negative with exception of weakness  PMH:  Past Medical History:  Diagnosis Date  . Atrial fibrillation ([place])   . Bladder cancer ([place]) 1984  . Hypertension   . Stroke [facility])     PSH:  Past Surgical History:  Procedure Laterality Date  . IR CT HEAD LTD  05/27/2017  . IR PERCUTANEOUS ART THROMBECTOMY/INFUSION INTRACRANIAL INC DIAG ANGIO  05/27/2017  . PACEMAKER INSERTION    . RADIOLOGY WITH ANESTHESIA N/A 05/27/2017   Procedure: RADIOLOGY WITH ANESTHESIA;  Surgeon: Luanne Bras, MD;  Location: [place];  Service: Radiology;  Laterality: N/A;    Social History:  Social History   Socioeconomic History  . Marital status: Married    Spouse name: Not on file  . Number of children: Not on file  . Years of education: Not on file  . Highest education level: Not on file  Occupational History    Employer: [employer]  Social Needs  . Financial resource strain: Not on file  . Food insecurity:    Worry: Not on file    Inability: Not on file  . Transportation needs:    Medical: Not on file    Non-medical: Not on file  Tobacco Use  . Smoking status: Former Smoker    Packs/day: 1.50    Years: 25.00    Pack years: 37.50    Types: Cigarettes    Last attempt to quit: 06/12/1982    Years since quitting: 35.3  . Smokeless tobacco: Never Used  . Tobacco comment: pt states he quit when dx with bladder ca  Substance and Sexual Activity  . Alcohol use: Not Currently  . Drug use: No  . Sexual activity: Not on file  Lifestyle  . Physical activity:    Days per week: Not on file    Minutes per session: Not on file  . Stress: To some extent  Relationships  . Social connections:    Talks on phone: More than three times a week    Gets together: Twice a week    Attends religious  service: Not on file    Active member of club or organization: Not on file    Attends meetings of clubs or organizations: Never    Relationship status: Not on file  . Intimate partner violence:    Fear of current or ex partner: No    Emotionally abused: No    Physically abused: No    Forced sexual activity: No  Other Topics Concern  . Not on file  Social History Narrative  . Not on file    Family History:  Family History  Problem Relation Age of Onset  . Emphysema Father     Medications:   Current Outpatient Medications on File Prior to Visit  Medication Sig Dispense Refill  .  albuterol (PROVENTIL) (2.5 MG/3ML) 0.083% nebulizer solution Take 3 mLs (2.5 mg total) by nebulization every 6 (six) hours. (Patient taking differently: Take 2.5 mg by nebulization every 6 (six) hours as needed for wheezing or shortness of breath. ) 75 mL 12  . amLODipine (NORVASC) 10 MG tablet Take 1 tablet (10 mg total) by mouth daily. 90 tablet 0  . apixaban (ELIQUIS) 5 MG TABS tablet Take 1 tablet (5 mg total) by mouth 2 (two) times daily. 60 tablet   . atorvastatin (LIPITOR) 40 MG tablet Take 1 tablet (40 mg total) by mouth daily at 6 PM. 90 tablet 0  . benzonatate (TESSALON) 100 MG capsule Take 200 mg by mouth 3 (three) times daily as needed for cough.    . bethanechol (URECHOLINE) 25 MG tablet Take 1 tablet (25 mg total) by mouth 3 (three) times daily. (Patient taking differently: Take 25 mg by mouth 3 (three) times daily. 7:30am, 5pm, 9pm)    . carboxymethylcellulose (REFRESH TEARS) 0.5 % SOLN Place 1 drop into both eyes every 6 (six) hours as needed (dry eyes).    [name] latanoprost (XALATAN) 0.005 % ophthalmic solution Place 1 drop into both eyes at bedtime.    [name] levothyroxine (SYNTHROID, LEVOTHROID) 125 MCG tablet Take 125 mcg by mouth daily.     . pregabalin (LYRICA) 75 MG capsule Take 75 mg by mouth 2 (two) times daily.    . tamsulosin (FLOMAX) 0.4 MG CAPS capsule Take 1 capsule (0.4 mg total) by mouth  daily after supper. (Patient taking differently: Take 0.4 mg by mouth at bedtime. ) 30 capsule   . timolol (TIMOPTIC) 0.5 % ophthalmic solution Place 1 drop into the left eye daily.     No current facility-administered medications on file prior to visit.     Allergies:  No Known Allergies   Physical Exam  Vitals:   10/07/17 1441  BP: 123/76  Pulse: 60  Height: 6\' 1"  (1.854 m)   Body mass index is 26.91 kg/m. No exam data present  General: well developed, pleasant elderly Caucasian male, well nourished, seated, in no evident distress Head: head normocephalic and atraumatic.   Neck: supple with no carotid or supraclavicular bruits Cardiovascular: regular rate and rhythm, no murmurs Musculoskeletal: no deformity Skin:  no rash/petichiae Vascular:  Normal pulses all extremities  Neurologic Exam Mental Status: Awake and fully alert. Oriented to place and time. Recent and remote memory intact. Attention span, concentration and fund of knowledge appropriate. Mood and affect appropriate.  Cranial Nerves: Fundoscopic exam reveals sharp disc margins. Pupils equal, briskly reactive to light. Extraocular movements full without nystagmus. Visual fields full to confrontation. Hearing intact. Facial sensation intact. Minor facial paralysis on left lower face Motor: 1/5 left upper and lower extremity; negative for spasticity; 5/5 strength right upper and lower extremity Sensory.:  Decreased sensation bilaterally distal lower extremities, left leg sensation decreased up to thigh Coordination: Rapid alternating movements normal on right side. finger-to-nose and heel-to-shin performed accurately on right side Gait and Station: Patient currently wheelchair-bound -unable to test gait Reflexes: 1+ and symmetric. Toes downgoing.    NIHSS  8 Modified Rankin  4 HAS-BLED 3 CHA2DS2-VASc 6   Diagnostic Data (Labs, Imaging, Testing)  CT head WO contrast 05/27/2017 IMPRESSION: 1. Positive for  hyperdense right MCA. Hypodensity in the right frontal lobe appears mostly confined to the white matter. ASPECTS is 9. No hemorrhage or mass effect. 2. Subacute appearing left PCA territory infarct with no hemorrhage or mass effect.  CTA head/neck CT cerebral perfusion 05/27/2017 IMPRESSION: 1. Emergent large vessel occlusion of the distal M1 segment of the right middle cerebral artery. Very poor collateralization throughout the right MCA territory. 2. Right MCA territory infarct of 52 mL with 108 mL ischemic penumbra. 3. No midline shift or other mass effect.  No acute hemorrhage. 4. Hila convexity meningioma. 5. Carotid and aortic atherosclerosis (ICD10-I70.0) without hemodynamically significant stenosis of the carotid or vertebral Arteries.  IR percutaneous arterial thrombectomy  05/27/2017 IMPRESSION: Status post endovascular revascularization of occluded right middle cerebral artery with 5 passes with retrieval devices primarily the Solitaire FR 4 mm x 40 mm retrieval device, and once with the Trevo ProVue 4 mm x 30 mm retrieval device achieving a TICI 2b reperfusion. Underline narrowing and attenuation of the M2 M3 branches of the right middle cerebral artery suggestive of intracranial arteriosclerosis.  MRI brain WO contrast MRA head WO contrast 05/28/2017 IMPRESSION: Acute right MCA infarct involving the right insula and frontal parietal lobe. Negative for hemorrhage.  Small acute or subacute infarct left parietal lobe. Subacute infarct left occipital lobe. Findings suggest emboli.  Status post revascularization of right middle cerebral artery occlusion. There remains moderate to severe stenosis proximal right M2 segment which may be due to underlying atherosclerotic disease.  2D echocardiogram 05/28/2017 Study Conclusions - Left ventricle: The cavity size was normal. Wall thickness was   increased in a pattern of mild LVH. Systolic function was normal.    The estimated ejection fraction was in the range of 60% to 65%.   Wall motion was normal; there were no regional wall motion   abnormalities. Features are consistent with a pseudonormal left   ventricular filling pattern, with concomitant abnormal relaxation   and increased filling pressure (grade 2 diastolic dysfunction). - Aortic valve: Trileaflet; mildly thickened, mildly calcified   leaflets. - Aorta: The aorta was mildly calcified. - Mitral valve: There was trivial regurgitation. - Left atrium: The atrium was moderately dilated. - Right ventricle: The cavity size was mildly dilated. Wall   thickness was normal. Pacer wire or catheter noted in right   ventricle. - Right atrium: The atrium was mildly dilated. - Tricuspid valve: There was mild regurgitation. Impressions: - No cardiac source of emboli was indentified.    ASSESSMENT: Derek Hays is a 84 y.o. year old male here with  multiple infarct stroke on 05/27/2017 secondary to atrial fibrillation not on [facility]. Vascular risk factors include atrial fibrillation, HTN, and HLD. Patient returns today for follow-up appointment and is accompanied by his wife.   PLAN: -Continue Eliquis (apixaban) daily  and lipitor for secondary stroke prevention -Continue home PT/OT -F/u with PCP regarding your atrial fibrillation, HLD and HTN management -continue to monitor BP at home -Maintain strict control of hypertension with blood pressure goal below 130/90, diabetes with hemoglobin A1c goal below 6.5% and cholesterol with LDL cholesterol (bad cholesterol) goal below 70 mg/dL. I also advised the patient to eat a healthy diet with plenty of whole grains, cereals, fruits and vegetables, exercise regularly and maintain ideal body weight.  Follow up in 6 months or call earlier if needed  Greater than 50% of time during this 25 minute visit was spent on counseling,explanation of diagnosis of multiple infarct stroke, reviewing risk factor management of  HLD, A. fib and HTN, planning of further management, discussion with patient and family and coordination of care   Venancio Poisson, [facility]  [facility] [address] [place], [place] [address]  Phone [phone] Fax [phone]

## 2017-10-07 NOTE — Patient Instructions (Addendum)
Continue Eliquis (apixaban) daily  and lipitor  for secondary stroke prevention   Continue to follow up with PCP regarding cholesterol, atrial fibrillation, and blood pressure management   Continue home health nursing and therapies  Continue to monitor blood pressure at home  Maintain strict control of hypertension with blood pressure goal below 130/90, diabetes with hemoglobin A1c goal below 6.5% and cholesterol with LDL cholesterol (bad cholesterol) goal below 70 mg/dL. I also advised the patient to eat a healthy diet with plenty of whole grains, cereals, fruits and vegetables, exercise regularly and maintain ideal body weight.  Followup in the future with me in 6 months or call earlier if needed       Thank you for coming to see [place] at [facility]. I hope we have been able to provide you high quality care today.  You may receive a patient satisfaction survey over the next few weeks. We would appreciate your feedback and comments so that we may continue to improve ourselves and the health of our patients.

## 2017-10-07 NOTE — Progress Notes (Signed)
I agree with the above plan 

## 2017-11-05 ENCOUNTER — Emergency Department (HOSPITAL_COMMUNITY): Payer: Medicare Other

## 2017-11-05 ENCOUNTER — Encounter (HOSPITAL_COMMUNITY): Payer: Self-pay

## 2017-11-05 ENCOUNTER — Inpatient Hospital Stay (HOSPITAL_COMMUNITY)
Admission: EM | Admit: 2017-11-05 | Discharge: 2017-11-12 | DRG: 871 | Disposition: A | Discharge status: Home Health | Payer: Medicare Other | Attending: Internal Medicine | Admitting: Internal Medicine

## 2017-11-05 DIAGNOSIS — Z950 Presence of cardiac pacemaker: Secondary | ICD-10-CM | POA: Diagnosis not present

## 2017-11-05 DIAGNOSIS — R579 Shock, unspecified: Secondary | ICD-10-CM

## 2017-11-05 DIAGNOSIS — A419 Sepsis, unspecified organism: Principal | ICD-10-CM | POA: Diagnosis present

## 2017-11-05 DIAGNOSIS — E039 Hypothyroidism, unspecified: Secondary | ICD-10-CM | POA: Diagnosis present

## 2017-11-05 DIAGNOSIS — I63511 Cerebral infarction due to unspecified occlusion or stenosis of right middle cerebral artery: Secondary | ICD-10-CM | POA: Diagnosis present

## 2017-11-05 DIAGNOSIS — R29810 Facial weakness: Secondary | ICD-10-CM | POA: Diagnosis present

## 2017-11-05 DIAGNOSIS — I130 Hypertensive heart and chronic kidney disease with heart failure and stage 1 through stage 4 chronic kidney disease, or unspecified chronic kidney disease: Secondary | ICD-10-CM | POA: Diagnosis present

## 2017-11-05 DIAGNOSIS — Z7901 Long term (current) use of anticoagulants: Secondary | ICD-10-CM

## 2017-11-05 DIAGNOSIS — E872 Acidosis: Secondary | ICD-10-CM | POA: Diagnosis present

## 2017-11-05 DIAGNOSIS — I5033 Acute on chronic diastolic (congestive) heart failure: Secondary | ICD-10-CM | POA: Diagnosis present

## 2017-11-05 DIAGNOSIS — I480 Paroxysmal atrial fibrillation: Secondary | ICD-10-CM | POA: Diagnosis present

## 2017-11-05 DIAGNOSIS — N183 Chronic kidney disease, stage 3 unspecified: Secondary | ICD-10-CM | POA: Diagnosis present

## 2017-11-05 DIAGNOSIS — M2550 Pain in unspecified joint: Secondary | ICD-10-CM | POA: Diagnosis not present

## 2017-11-05 DIAGNOSIS — R0689 Other abnormalities of breathing: Secondary | ICD-10-CM | POA: Diagnosis not present

## 2017-11-05 DIAGNOSIS — Z79899 Other long term (current) drug therapy: Secondary | ICD-10-CM

## 2017-11-05 DIAGNOSIS — R5383 Other fatigue: Secondary | ICD-10-CM | POA: Diagnosis not present

## 2017-11-05 DIAGNOSIS — G9341 Metabolic encephalopathy: Secondary | ICD-10-CM | POA: Diagnosis present

## 2017-11-05 DIAGNOSIS — Z8551 Personal history of malignant neoplasm of bladder: Secondary | ICD-10-CM | POA: Diagnosis not present

## 2017-11-05 DIAGNOSIS — I482 Chronic atrial fibrillation: Secondary | ICD-10-CM | POA: Diagnosis not present

## 2017-11-05 DIAGNOSIS — I4821 Permanent atrial fibrillation: Secondary | ICD-10-CM

## 2017-11-05 DIAGNOSIS — Z87891 Personal history of nicotine dependence: Secondary | ICD-10-CM

## 2017-11-05 DIAGNOSIS — R112 Nausea with vomiting, unspecified: Secondary | ICD-10-CM | POA: Diagnosis not present

## 2017-11-05 DIAGNOSIS — I509 Heart failure, unspecified: Secondary | ICD-10-CM

## 2017-11-05 DIAGNOSIS — E785 Hyperlipidemia, unspecified: Secondary | ICD-10-CM | POA: Diagnosis present

## 2017-11-05 DIAGNOSIS — N179 Acute kidney failure, unspecified: Secondary | ICD-10-CM | POA: Diagnosis present

## 2017-11-05 DIAGNOSIS — D320 Benign neoplasm of cerebral meninges: Secondary | ICD-10-CM | POA: Diagnosis not present

## 2017-11-05 DIAGNOSIS — R6510 Systemic inflammatory response syndrome (SIRS) of non-infectious origin without acute organ dysfunction: Secondary | ICD-10-CM | POA: Diagnosis not present

## 2017-11-05 DIAGNOSIS — I69354 Hemiplegia and hemiparesis following cerebral infarction affecting left non-dominant side: Secondary | ICD-10-CM | POA: Diagnosis not present

## 2017-11-05 DIAGNOSIS — Y95 Nosocomial condition: Secondary | ICD-10-CM | POA: Diagnosis present

## 2017-11-05 DIAGNOSIS — R531 Weakness: Secondary | ICD-10-CM | POA: Diagnosis not present

## 2017-11-05 DIAGNOSIS — I959 Hypotension, unspecified: Secondary | ICD-10-CM | POA: Diagnosis not present

## 2017-11-05 DIAGNOSIS — Z7401 Bed confinement status: Secondary | ICD-10-CM | POA: Diagnosis not present

## 2017-11-05 DIAGNOSIS — R110 Nausea: Secondary | ICD-10-CM | POA: Diagnosis not present

## 2017-11-05 DIAGNOSIS — J189 Pneumonia, unspecified organism: Secondary | ICD-10-CM | POA: Diagnosis present

## 2017-11-05 DIAGNOSIS — J90 Pleural effusion, not elsewhere classified: Secondary | ICD-10-CM | POA: Diagnosis not present

## 2017-11-05 DIAGNOSIS — R0902 Hypoxemia: Secondary | ICD-10-CM | POA: Diagnosis not present

## 2017-11-05 LAB — CBC WITH DIFFERENTIAL/PLATELET
Abs Immature Granulocytes: 0.1 10*3/uL (ref 0.0–0.1)
BASOS PCT: 0 %
Basophils Absolute: 0 10*3/uL (ref 0.0–0.1)
EOS ABS: 0 10*3/uL (ref 0.0–0.7)
Eosinophils Relative: 0 %
HCT: 39.3 % (ref 39.0–52.0)
Hemoglobin: 12.7 g/dL — ABNORMAL LOW (ref 13.0–17.0)
IMMATURE GRANULOCYTES: 1 %
Lymphocytes Relative: 4 %
Lymphs Abs: 0.5 10*3/uL — ABNORMAL LOW (ref 0.7–4.0)
MCH: 28.9 pg (ref 26.0–34.0)
MCHC: 32.3 g/dL (ref 30.0–36.0)
MCV: 89.3 fL (ref 78.0–100.0)
Monocytes Absolute: 0.6 10*3/uL (ref 0.1–1.0)
Monocytes Relative: 4 %
NEUTROS PCT: 91 %
Neutro Abs: 12.2 10*3/uL — ABNORMAL HIGH (ref 1.7–7.7)
PLATELETS: 188 10*3/uL (ref 150–400)
RBC: 4.4 MIL/uL (ref 4.22–5.81)
RDW: 16.8 % — AB (ref 11.5–15.5)
WBC: 13.5 10*3/uL — AB (ref 4.0–10.5)

## 2017-11-05 LAB — CBG MONITORING, ED: GLUCOSE-CAPILLARY: 114 mg/dL — AB (ref 70–99)

## 2017-11-05 LAB — COMPREHENSIVE METABOLIC PANEL
ALT: 9 U/L (ref 0–44)
ANION GAP: 13 (ref 5–15)
AST: 19 U/L (ref 15–41)
Albumin: 2.9 g/dL — ABNORMAL LOW (ref 3.5–5.0)
Alkaline Phosphatase: 67 U/L (ref 38–126)
BUN: 25 mg/dL — ABNORMAL HIGH (ref 8–23)
CHLORIDE: 104 mmol/L (ref 98–111)
CO2: 23 mmol/L (ref 22–32)
CREATININE: 1.45 mg/dL — AB (ref 0.61–1.24)
Calcium: 8.8 mg/dL — ABNORMAL LOW (ref 8.9–10.3)
GFR, EST AFRICAN AMERICAN: 49 mL/min — AB (ref >60)
GFR, EST NON AFRICAN AMERICAN: 43 mL/min — AB (ref >60)
Glucose, Bld: 131 mg/dL — ABNORMAL HIGH (ref 70–99)
Potassium: 4.8 mmol/L (ref 3.5–5.1)
Sodium: 140 mmol/L (ref 135–145)
Total Bilirubin: 1.4 mg/dL — ABNORMAL HIGH (ref 0.3–1.2)
Total Protein: 6 g/dL — ABNORMAL LOW (ref 6.5–8.1)

## 2017-11-05 LAB — TYPE AND SCREEN
ABO/RH(D): AB POS
Antibody Screen: NEGATIVE

## 2017-11-05 LAB — I-STAT CG4 LACTIC ACID, ED
LACTIC ACID, VENOUS: 3.03 mmol/L — AB (ref 0.5–1.9)
LACTIC ACID, VENOUS: 2.97 mmol/L — AB (ref 0.5–1.9)
Lactic Acid, Venous: 2.49 mmol/L (ref 0.5–1.9)

## 2017-11-05 LAB — URINALYSIS, ROUTINE W REFLEX MICROSCOPIC
BILIRUBIN URINE: NEGATIVE
Glucose, UA: NEGATIVE mg/dL
HGB URINE DIPSTICK: NEGATIVE
Ketones, ur: NEGATIVE mg/dL
Leukocytes, UA: NEGATIVE
NITRITE: NEGATIVE
PROTEIN: NEGATIVE mg/dL
Specific Gravity, Urine: 1.016 (ref 1.005–1.030)
pH: 5 (ref 5.0–8.0)

## 2017-11-05 LAB — PROTIME-INR
INR: 1.8
PROTHROMBIN TIME: 20.7 s — AB (ref 11.4–15.2)

## 2017-11-05 LAB — APTT: aPTT: 36 seconds (ref 24–36)

## 2017-11-05 MED ORDER — LACTATED RINGERS IV BOLUS (SEPSIS)
2500.0000 mL | Freq: Once | INTRAVENOUS | Status: AC
Start: 2017-11-05 — End: 2017-11-05
  Administered 2017-11-05: 2500 mL via INTRAVENOUS

## 2017-11-05 MED ORDER — VANCOMYCIN HCL IN DEXTROSE 750-5 MG/150ML-% IV SOLN
750.0000 mg | Freq: Two times a day (BID) | INTRAVENOUS | Status: DC
  Administered 2017-11-05 – 2017-11-08 (×7): 750 mg via INTRAVENOUS
  Filled 2017-11-05 (×8): qty 150

## 2017-11-05 MED ORDER — SODIUM CHLORIDE 0.9 % IV SOLN
2.0000 g | INTRAVENOUS | Status: DC
  Administered 2017-11-05: 2 g via INTRAVENOUS
  Filled 2017-11-05: qty 20

## 2017-11-05 NOTE — Progress Notes (Signed)
Pharmacy Antibiotic Note  Derek Hays is a 84 y.o. male admitted on 11/05/2017 with sepsis.   Plan: Ctx 2 g q24h Vanc 750 mg q12h Monitor renal fx cx vt prn  Height: 6' (182.9 cm) Weight: 210 lb (95.3 kg) IBW/kg (Calculated) : 77.6  Temp (24hrs), Avg:99.7 F (37.6 C), Min:99.7 F (37.6 C), Max:99.7 F (37.6 C)  Recent Labs  Lab 11/05/17 1945 11/05/17 2001 11/05/17 2013  WBC 13.5*  --   --   CREATININE 1.45*  --   --   LATICACIDVEN  --  3.03* 2.49*    Estimated Creatinine Clearance: 45.4 mL/min (A) (by C-G formula based on SCr of 1.45 mg/dL (H)).    No Known Allergies  Levester Fresh, PharmD, BCPS, BCCCP Clinical Pharmacist [phone]  Please check AMION for all [place] numbers  11/05/2017 10:10 PM

## 2017-11-05 NOTE — ED Notes (Signed)
Patient transported to X-ray 

## 2017-11-05 NOTE — ED Provider Notes (Signed)
[facility] EMERGENCY DEPARTMENT Provider Note   CSN: [id number] Arrival date & time: 11/05/17  1843     History   Chief Complaint Chief Complaint  Patient presents with  . Code Sepsis    HPI Derek Hays is a 84 y.o. male.  HPI  84 year old male with history of A. fib, hypertension, stroke, bladder cancer comes in with chief complaint of weakness. Patient has history of left-sided stroke and resides at home.  Home nurse requested patient to be evaluated because she felt that the left-sided facial droop was worse and so was the weakness.  She also checked patient's blood pressure which was low, and patient had an associated low-grade fever.  Patient denies any new chest pain, shortness of breath, dizziness, headache, neck pain, abdominal pain, back pain, UTI-like symptoms, rash.  He reports that he has generalized weakness and feeling sick, however he does not think that his left-sided symptoms are worse than usual.  Patient is full code.  Past Medical History:  Diagnosis Date  . Atrial fibrillation ([place])   . Bladder cancer ([place]) 1984  . Hypertension   . Stroke [facility])     Patient Active Problem List   Diagnosis Date Noted  . Urinary tract infection 09/23/2017  . Acute respiratory failure with hypoxia ([place]) 09/23/2017  . Oropharyngeal dysphagia 09/23/2017  . Palliative care by specialist   . Sepsis due to urinary tract infection ([place])   . HCAP (healthcare-associated pneumonia)   . Altered mental status 08/10/2017  . Acute blood loss anemia   . Stage 3 chronic kidney disease ([place])   . PAF (paroxysmal atrial fibrillation) ([place])   . Benign essential HTN   . Dysphagia, post-stroke   . 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 Stroke (cerebrum) ([place]) 05/27/2017    Past Surgical History:  Procedure Laterality Date  . IR CT HEAD LTD  05/27/2017  . IR PERCUTANEOUS ART  THROMBECTOMY/INFUSION INTRACRANIAL INC DIAG ANGIO  05/27/2017  . PACEMAKER INSERTION    . RADIOLOGY WITH ANESTHESIA N/A 05/27/2017   Procedure: RADIOLOGY WITH ANESTHESIA;  Surgeon: Luanne Bras, MD;  Location: [place];  Service: Radiology;  Laterality: N/A;        Home Medications    Prior to Admission medications   Medication Sig Start Date End Date Taking? Authorizing Provider  albuterol (PROVENTIL) (2.5 MG/3ML) 0.083% nebulizer solution Take 3 mLs (2.5 mg total) by nebulization every 6 (six) hours. Patient taking differently: Take 2.5 mg by nebulization every 6 (six) hours as needed for wheezing or shortness of breath.  08/13/17  Yes Rory Percy, DO  amLODipine (NORVASC) 10 MG tablet Take 1 tablet (10 mg total) by mouth daily. 08/14/17  Yes Rory Percy, DO  apixaban (ELIQUIS) 5 MG TABS tablet Take 1 tablet (5 mg total) by mouth 2 (two) times daily. 06/19/17  Yes Angiulli, Lavon Paganini, PA-C  atorvastatin (LIPITOR) 40 MG tablet Take 1 tablet (40 mg total) by mouth daily at 6 PM. 08/13/17  Yes Rumball, Bryson Ha, DO  benzonatate (TESSALON) 100 MG capsule Take 200 mg by mouth 3 (three) times daily as needed for cough.   Yes [provider]  bethanechol (URECHOLINE) 25 MG tablet Take 1 tablet (25 mg total) by mouth 3 (three) times daily. Patient taking differently: Take 25 mg by mouth 3 (three) times daily. 7:30am, 5pm, 9pm 06/19/17  Yes Angiulli, Lavon Paganini, PA-C  carboxymethylcellulose (REFRESH TEARS) 0.5 % SOLN  Place 1 drop into both eyes every 6 (six) hours as needed (dry eyes).   Yes [provider]  latanoprost (XALATAN) 0.005 % ophthalmic solution Place 1 drop into both eyes at bedtime.   Yes [provider]  levothyroxine (SYNTHROID, LEVOTHROID) 125 MCG tablet Take 125 mcg by mouth daily.    Yes [provider]  pregabalin (LYRICA) 75 MG capsule Take 75 mg by mouth 2 (two) times daily.   Yes [provider]  tamsulosin (FLOMAX) 0.4 MG CAPS capsule  Take 1 capsule (0.4 mg total) by mouth daily after supper. Patient taking differently: Take 0.4 mg by mouth at bedtime.  06/19/17  Yes Angiulli, Lavon Paganini, PA-C    Family History Family History  Problem Relation Age of Onset  . Emphysema Father     Social History Social History   Tobacco Use  . Smoking status: Former Smoker    Packs/day: 1.50    Years: 25.00    Pack years: 37.50    Types: Cigarettes    Last attempt to quit: 06/12/1982    Years since quitting: 35.4  . Smokeless tobacco: Never Used  . Tobacco comment: pt states he quit when dx with bladder ca  Substance Use Topics  . Alcohol use: Not Currently  . Drug use: No     Allergies   Patient has no known allergies.   Review of Systems Review of Systems  Constitutional: Positive for activity change and fatigue.  Respiratory: Negative for cough and shortness of breath.   Cardiovascular: Negative for chest pain.  Gastrointestinal: Negative for abdominal pain.  Genitourinary: Negative for dysuria.  Skin: Negative for rash.  Neurological: Positive for weakness.  Hematological: Bruises/bleeds easily.  All other systems reviewed and are negative.    Physical Exam Updated Vital Signs BP 109/62   Pulse (!) 59   Temp 99.2 F (37.3 C) (Rectal)   Resp 16   Ht 6' (1.829 m)   Wt 95.3 kg (210 lb)   SpO2 97%   BMI 28.48 kg/m   Physical Exam  Constitutional: He is oriented to person, place, and time. He appears well-developed.  HENT:  Head: Atraumatic.  Neck: Neck supple.  Cardiovascular: Normal rate.  Pulmonary/Chest: Effort normal. No stridor. He has no wheezes.  Abdominal: Soft. There is no tenderness.  Musculoskeletal:  Left upper and lower extremity weakness, left facial droop.  Neurological: He is alert and oriented to person, place, and time.  Skin: Skin is warm.  Nursing note and vitals reviewed.    ED Treatments / Results  Labs (all labs ordered are listed, but only abnormal results are  displayed) Labs Reviewed  COMPREHENSIVE METABOLIC PANEL - Abnormal; Notable for the following components:      Result Value   Glucose, Bld 131 (*)    BUN 25 (*)    Creatinine, Ser 1.45 (*)    Calcium 8.8 (*)    Total Protein 6.0 (*)    Albumin 2.9 (*)    Total Bilirubin 1.4 (*)    GFR calc non Af Amer 43 (*)    GFR calc Af Amer 49 (*)    All other components within normal limits  CBC WITH DIFFERENTIAL/PLATELET - Abnormal; Notable for the following components:   WBC 13.5 (*)    Hemoglobin 12.7 (*)    RDW 16.8 (*)    Neutro Abs 12.2 (*)    Lymphs Abs 0.5 (*)    All other components within normal limits  URINALYSIS, ROUTINE  W REFLEX MICROSCOPIC - Abnormal; Notable for the following components:   APPearance HAZY (*)    All other components within normal limits  PROTIME-INR - Abnormal; Notable for the following components:   Prothrombin Time 20.7 (*)    All other components within normal limits  I-STAT CG4 LACTIC ACID, ED - Abnormal; Notable for the following components:   Lactic Acid, Venous 3.03 (*)    All other components within normal limits  I-STAT CG4 LACTIC ACID, ED - Abnormal; Notable for the following components:   Lactic Acid, Venous 2.49 (*)    All other components within normal limits  I-STAT CG4 LACTIC ACID, ED - Abnormal; Notable for the following components:   Lactic Acid, Venous 2.97 (*)    All other components within normal limits  CULTURE, BLOOD (ROUTINE X 2)  CULTURE, BLOOD (ROUTINE X 2)  URINE CULTURE  APTT  I-STAT CG4 LACTIC ACID, ED  TYPE AND SCREEN    EKG EKG Interpretation  Date/Time:  Tuesday November 05 2017 21:03:36 EDT Ventricular Rate:  60 PR Interval:    QRS Duration: 151 QT Interval:  505 QTC Calculation: 505 R Axis:   -102 Text Interpretation:  Ventricular-paced rhythm No further analysis attempted due to paced rhythm No acute changes Nonspecific ST and T wave abnormality No significant change since last tracing Confirmed by Varney Biles  [phone]) on 11/05/2017 9:14:20 PM   Radiology Dg Chest 2 View  Result Date: 11/05/2017 CLINICAL DATA:  Difficulty raising the left arm, drank water and did not agree with him, history of atrial fibrillation and hypertension EXAM: CHEST - 2 VIEW COMPARISON:  09/23/2017, 08/11/2017 FINDINGS: Left-sided pacing device as before. Mild cardiomegaly. No acute airspace disease. Small pleural effusions. Aortic atherosclerosis. No pneumothorax. IMPRESSION: Small pleural effusions and cardiomegaly. Electronically Signed   By: Donavan Foil M.D.   On: 11/05/2017 20:48    Procedures Procedures (including critical care time)  Medications Ordered in ED Medications  cefTRIAXone (ROCEPHIN) 2 g in sodium chloride 0.9 % 100 mL IVPB (0 g Intravenous Stopped 11/05/17 2045)  vancomycin (VANCOCIN) IVPB 750 mg/150 ml premix (has no administration in time range)  lactated ringers bolus 2,500 mL (0 mLs Intravenous Stopped 11/05/17 2212)     Initial Impression / Assessment and Plan / ED Course  I have reviewed the triage vital signs and the nursing notes.  Pertinent labs & imaging results that were available during my care of the patient were reviewed by me and considered in my medical decision making (see chart for details).  Clinical Course as of Nov 06 2215  Tue Nov 05, 2017  2213 BP has improved with IV hydration. Patient was given 500 cc normal saline by EMS, we added 2.5 L of LR.  BP: 109/62 [AN]  2213 Urinalysis, Routine w reflex microscopic(!) [AN]  2216 Repeat lactate is improved. Sepsis reassessment has been completed.  Lactic Acid, Venous(!!): 2.49 [AN]  2216 No clear signs of infection.  Urine culture has been sent out.  Vancomycin will be added to patient's antibiotic regimen.  Urinalysis, Routine w reflex microscopic(!) [AN]  2216 Given modest improvement in patient's vital signs, I think he can be admitted safely to stepdown ICU.  We will speak with the hospitalist for admission.   [AN]  2217  Creatinine is worse than baseline  Creatinine(!): 1.45 [AN]    Clinical Course User Index [AN] Varney Biles, MD    84 year old male comes in with chief complaint of worsening weakness.  Patient is  also noted to have low blood pressure along with low-grade temperature.  Clinical concerns are for septic shock.  Patient has history of UTIs and pneumonias in the past, x-ray of the chest and urinalysis have been ordered.  Based on history alone, there is no clear source of infection.  Additionally, patient's home nurse reports that the left-sided residual symptoms from the stroke were worse today.  It is quite likely possible that the symptoms are worse because of patient's hypotension and underlying acute process of infection and not a new stroke.  Patient denies any new or worsening left-sided weakness to [place].  Sepsis pathway will be initiated.  Final Clinical Impressions(s) / ED Diagnoses   Final diagnoses:  Shock ([place])  AKI (acute kidney injury) [facility])    ED Discharge Orders    None       Varney Biles, MD 11/05/17 2217

## 2017-11-05 NOTE — ED Triage Notes (Signed)
Pt presents from home with report of worsening L sided facial droop and weakness that home health nurse reports since yesterday.  Pt has h/o CVA with L sided weakness with symptoms worse since yesterday.

## 2017-11-06 ENCOUNTER — Encounter (HOSPITAL_COMMUNITY): Payer: Self-pay | Admitting: Internal Medicine

## 2017-11-06 ENCOUNTER — Inpatient Hospital Stay (HOSPITAL_COMMUNITY): Payer: Medicare Other

## 2017-11-06 LAB — COMPREHENSIVE METABOLIC PANEL
ALK PHOS: 51 U/L (ref 38–126)
ALT: 9 U/L (ref 0–44)
ANION GAP: 6 (ref 5–15)
AST: 14 U/L — ABNORMAL LOW (ref 15–41)
Albumin: 2.2 g/dL — ABNORMAL LOW (ref 3.5–5.0)
BILIRUBIN TOTAL: 0.9 mg/dL (ref 0.3–1.2)
BUN: 21 mg/dL (ref 8–23)
CALCIUM: 7.2 mg/dL — AB (ref 8.9–10.3)
CO2: 23 mmol/L (ref 22–32)
Chloride: 112 mmol/L — ABNORMAL HIGH (ref 98–111)
Creatinine, Ser: 1.16 mg/dL (ref 0.61–1.24)
GFR calc non Af Amer: 56 mL/min — ABNORMAL LOW (ref >60)
GLUCOSE: 98 mg/dL (ref 70–99)
Potassium: 3.2 mmol/L — ABNORMAL LOW (ref 3.5–5.1)
Sodium: 141 mmol/L (ref 135–145)
TOTAL PROTEIN: 4.4 g/dL — AB (ref 6.5–8.1)

## 2017-11-06 LAB — CBC WITH DIFFERENTIAL/PLATELET
Abs Immature Granulocytes: 0 10*3/uL (ref 0.0–0.1)
Basophils Absolute: 0 10*3/uL (ref 0.0–0.1)
Basophils Relative: 0 %
EOS PCT: 7 %
Eosinophils Absolute: 0.5 10*3/uL (ref 0.0–0.7)
HEMATOCRIT: 34.8 % — AB (ref 39.0–52.0)
Hemoglobin: 11.3 g/dL — ABNORMAL LOW (ref 13.0–17.0)
Immature Granulocytes: 1 %
LYMPHS ABS: 0.6 10*3/uL — AB (ref 0.7–4.0)
Lymphocytes Relative: 8 %
MCH: 29 pg (ref 26.0–34.0)
MCHC: 32.5 g/dL (ref 30.0–36.0)
MCV: 89.2 fL (ref 78.0–100.0)
MONO ABS: 0.5 10*3/uL (ref 0.1–1.0)
Monocytes Relative: 7 %
Neutro Abs: 6 10*3/uL (ref 1.7–7.7)
Neutrophils Relative %: 77 %
Platelets: 131 10*3/uL — ABNORMAL LOW (ref 150–400)
RBC: 3.9 MIL/uL — ABNORMAL LOW (ref 4.22–5.81)
RDW: 16.7 % — ABNORMAL HIGH (ref 11.5–15.5)
WBC: 7.8 10*3/uL (ref 4.0–10.5)

## 2017-11-06 LAB — APTT: aPTT: 36 seconds (ref 24–36)

## 2017-11-06 LAB — PROTIME-INR
INR: 1.81
Prothrombin Time: 20.8 seconds — ABNORMAL HIGH (ref 11.4–15.2)

## 2017-11-06 LAB — LACTIC ACID, PLASMA
LACTIC ACID, VENOUS: 1.4 mmol/L (ref 0.5–1.9)
Lactic Acid, Venous: 0.9 mmol/L (ref 0.5–1.9)

## 2017-11-06 LAB — PROCALCITONIN: PROCALCITONIN: 1.93 ng/mL

## 2017-11-06 MED ORDER — PREGABALIN 75 MG PO CAPS
75.0000 mg | ORAL_CAPSULE | Freq: Two times a day (BID) | ORAL | Status: DC
  Administered 2017-11-06 – 2017-11-12 (×14): 75 mg via ORAL
  Filled 2017-11-06 (×14): qty 1

## 2017-11-06 MED ORDER — ATORVASTATIN CALCIUM 40 MG PO TABS
40.0000 mg | ORAL_TABLET | Freq: Every day | ORAL | Status: DC
  Administered 2017-11-06 – 2017-11-11 (×6): 40 mg via ORAL
  Filled 2017-11-06 (×6): qty 1

## 2017-11-06 MED ORDER — PIPERACILLIN-TAZOBACTAM 3.375 G IVPB
3.3750 g | Freq: Three times a day (TID) | INTRAVENOUS | Status: DC
  Administered 2017-11-06 – 2017-11-08 (×7): 3.375 g via INTRAVENOUS
  Filled 2017-11-06 (×8): qty 50

## 2017-11-06 MED ORDER — APIXABAN 5 MG PO TABS
5.0000 mg | ORAL_TABLET | Freq: Two times a day (BID) | ORAL | Status: DC
  Administered 2017-11-06 – 2017-11-12 (×14): 5 mg via ORAL
  Filled 2017-11-06 (×14): qty 1

## 2017-11-06 MED ORDER — PIPERACILLIN-TAZOBACTAM 3.375 G IVPB 30 MIN
3.3750 g | Freq: Once | INTRAVENOUS | Status: DC
  Filled 2017-11-06: qty 50

## 2017-11-06 MED ORDER — BENZONATATE 100 MG PO CAPS: 200.0000 mg | ORAL_CAPSULE | Freq: Three times a day (TID) | ORAL | Status: DC | PRN

## 2017-11-06 MED ORDER — SODIUM CHLORIDE 0.9 % IV BOLUS
1000.0000 mL | Freq: Once | INTRAVENOUS | Status: AC
  Administered 2017-11-06: 1000 mL via INTRAVENOUS

## 2017-11-06 MED ORDER — BETHANECHOL CHLORIDE 25 MG PO TABS
25.0000 mg | ORAL_TABLET | Freq: Three times a day (TID) | ORAL | Status: DC
  Administered 2017-11-06 – 2017-11-12 (×19): 25 mg via ORAL
  Filled 2017-11-06 (×20): qty 1

## 2017-11-06 MED ORDER — SODIUM CHLORIDE 0.9 % IV SOLN
INTRAVENOUS | Status: AC
  Administered 2017-11-06 (×2): via INTRAVENOUS

## 2017-11-06 MED ORDER — LATANOPROST 0.005 % OP SOLN
1.0000 [drp] | Freq: Every day | OPHTHALMIC | Status: DC
  Administered 2017-11-06 – 2017-11-11 (×7): 1 [drp] via OPHTHALMIC
  Filled 2017-11-06: qty 2.5

## 2017-11-06 MED ORDER — ACETAMINOPHEN 325 MG PO TABS
650.0000 mg | ORAL_TABLET | Freq: Four times a day (QID) | ORAL | Status: DC | PRN
  Administered 2017-11-07 – 2017-11-08 (×2): 650 mg via ORAL
  Filled 2017-11-06 (×2): qty 2

## 2017-11-06 MED ORDER — HYPROMELLOSE (GONIOSCOPIC) 2.5 % OP SOLN: 1.0000 [drp] | Freq: Four times a day (QID) | OPHTHALMIC | Status: DC | PRN

## 2017-11-06 MED ORDER — ALBUTEROL SULFATE (2.5 MG/3ML) 0.083% IN NEBU: 2.5000 mg | INHALATION_SOLUTION | Freq: Four times a day (QID) | RESPIRATORY_TRACT | Status: DC | PRN

## 2017-11-06 MED ORDER — ACETAMINOPHEN 650 MG RE SUPP: 650.0000 mg | Freq: Four times a day (QID) | RECTAL | Status: DC | PRN

## 2017-11-06 MED ORDER — ONDANSETRON HCL 4 MG/2ML IJ SOLN: 4.0000 mg | Freq: Four times a day (QID) | INTRAMUSCULAR | Status: DC | PRN

## 2017-11-06 MED ORDER — ONDANSETRON HCL 4 MG PO TABS: 4.0000 mg | ORAL_TABLET | Freq: Four times a day (QID) | ORAL | Status: DC | PRN

## 2017-11-06 MED ORDER — LEVOTHYROXINE SODIUM 25 MCG PO TABS
125.0000 ug | ORAL_TABLET | Freq: Every day | ORAL | Status: DC
  Administered 2017-11-06 – 2017-11-12 (×7): 125 ug via ORAL
  Filled 2017-11-06 (×7): qty 1

## 2017-11-06 MED ORDER — TAMSULOSIN HCL 0.4 MG PO CAPS
0.4000 mg | ORAL_CAPSULE | Freq: Every day | ORAL | Status: DC
  Administered 2017-11-06 – 2017-11-11 (×7): 0.4 mg via ORAL
  Filled 2017-11-06 (×7): qty 1

## 2017-11-06 NOTE — Progress Notes (Signed)
1000cc NS bolus plus 400 maintenance fluid per orders. Bladder scan 349ml. Only 100 cc urine output since 8 am. No abdominal distention/pain. Lungs clr/bilat. Dr. Horris Latino paged. Jerald Kief, RN

## 2017-11-06 NOTE — Progress Notes (Signed)
PT Cancellation Note  Patient Details Name: SOTA HETZ MRN: [id number] DOB: 1930-05-13   Cancelled Treatment:    Reason Eval/Treat Not Completed: Patient at procedure or test/unavailable (CT). Will follow-up for PT evaluation as schedule permits.  Mabeline Caras, PT, DPT Acute Rehab Services  Pager: [place] 11/06/2017, 8:17 AM

## 2017-11-06 NOTE — Progress Notes (Signed)
PROGRESS NOTE  [name] DXI:[id number] DOB: 1931/04/14 DOA: 11/05/2017 [facility]  HPI/Recap of past 24 hours: Derek Hays is a 84 y.o. male with history of CVA, afib, HTN, recently admitted in February with left-sided hemiplegia was noticed by patient's home health aide that patient was more weaker than usual on the left side.  As per the patient he did not find any difference. Patient also was found to have fever at home of temperature 100 F, denies any other symptoms. In the ER patient was hypotensive, febrile with elevated lactate.  Chest x-ray showed some mild pleural effusion, UA is unremarkable. ER physician discussed with on-call neurologist Dr. Leonel Ramsay who advised no stroke work-up and symptoms may be related to patient's possible developing sepsis and hypotension. Patient blood pressure improved with fluids.  Was started on empiric antibiotics after blood cultures obtained for possible developing sepsis.  Today, pt noted to sleepy, lethargic. Unable to perform ROS adequately.     Assessment/Plan: Principal Problem:   SIRS (systemic inflammatory response syndrome) (HCC) Active Problems:   Right middle cerebral artery stroke (HCC)   Stage 3 chronic kidney disease (HCC)   PAF (paroxysmal atrial fibrillation) (HCC)  Sepsis, of unknown origin Febrile, hypotensive, lactic acidosis, on admission Afebrile, with resolved leukocytosis Lactic acidosis resolved Procalcitonin elevated UA negative, UC pending BC X 2 pending CXR small pleural effusion, no consolidation noted IVF Continue Vancomycin and Zosyn  Acute metabolic encephalopathy Likely due to above CT head showed no evidence of acute intracranial abnormality, chronic ischemia including a large R MCA infarct  Hx of CVA with residual left sided weakness CT as above Monitor closely  AKI Resolving  Paroxysmal Afib Rate controlled Continue  eliquis  HTN Stable  Hypothyroidism Continue synthroid  HLD Continue lipitor     Code Status: Full  Family Communication: None at bedside  Disposition Plan: Once work up complete   Consultants:  None   Procedures:  None  Antimicrobials: Vancomycin Zosyn   DVT prophylaxis: Eliquis   Objective: Vitals:   11/06/17 0403 11/06/17 0759 11/06/17 1625 11/06/17 2014  BP: 110/67 120/69 127/78 110/78  Pulse: (!) 58 (!) 59  98  Resp: 14 13  16   Temp: 97.7 F (36.5 C) (!) 97.5 F (36.4 C) 97.7 F (36.5 C) 98.8 F (37.1 C)  TempSrc: Oral Axillary Oral Oral  SpO2: 96% 98%  92%  Weight:      Height:        Intake/Output Summary (Last 24 hours) at 11/06/2017 2144 Last data filed at 11/06/2017 1702 Gross per 24 hour  Intake 4797.48 ml  Output 100 ml  Net 4697.48 ml   Filed Weights   11/05/17 1858 11/06/17 0100  Weight: 95.3 kg (210 lb) 92.5 kg (203 lb 14.8 oz)    Exam:   General: Sleepy, lethargic   Cardiovascular: S1, S2 present   Respiratory: Diminished BS   Abdomen: Soft, NT, ND, BS present  Musculoskeletal: No pedal edema b/l  Skin: Normal  Psychiatry: Unable to assess   Data Reviewed: CBC: Recent Labs  Lab 11/05/17 1945 11/06/17 0509  WBC 13.5* 7.8  NEUTROABS 12.2* 6.0  HGB 12.7* 11.3*  HCT 39.3 34.8*  MCV 89.3 89.2  PLT 188 767*   Basic Metabolic Panel: Recent Labs  Lab 11/05/17 1945 11/06/17 0509  NA 140 141  K 4.8 3.2*  CL 104 112*  CO2 23 23  GLUCOSE 131* 98  BUN 25* 21  CREATININE 1.45* 1.16  CALCIUM  8.8* 7.2*   GFR: Estimated Creatinine Clearance: 52 mL/min (by C-G formula based on SCr of 1.16 mg/dL). Liver Function Tests: Recent Labs  Lab 11/05/17 1945 11/06/17 0509  AST 19 14*  ALT 9 9  ALKPHOS 67 51  BILITOT 1.4* 0.9  PROT 6.0* 4.4*  ALBUMIN 2.9* 2.2*   No results for input(s): LIPASE, AMYLASE in the last 168 hours. No results for input(s): AMMONIA in the last 168 hours. Coagulation  Profile: Recent Labs  Lab 11/05/17 1945 11/06/17 0509  INR 1.80 1.81   Cardiac Enzymes: No results for input(s): CKTOTAL, CKMB, CKMBINDEX, TROPONINI in the last 168 hours. BNP (last 3 results) No results for input(s): PROBNP in the last 8760 hours. HbA1C: No results for input(s): HGBA1C in the last 72 hours. CBG: Recent Labs  Lab 11/05/17 2243  GLUCAP 114*   Lipid Profile: No results for input(s): CHOL, HDL, LDLCALC, TRIG, CHOLHDL, LDLDIRECT in the last 72 hours. Thyroid Function Tests: No results for input(s): TSH, T4TOTAL, FREET4, T3FREE, THYROIDAB in the last 72 hours. Anemia Panel: No results for input(s): VITAMINB12, FOLATE, FERRITIN, TIBC, IRON, RETICCTPCT in the last 72 hours. Urine analysis:    Component Value Date/Time   COLORURINE YELLOW 11/05/2017 2112   APPEARANCEUR HAZY (A) 11/05/2017 2112   LABSPEC 1.016 11/05/2017 2112   PHURINE 5.0 11/05/2017 2112   GLUCOSEU NEGATIVE 11/05/2017 2112   HGBUR NEGATIVE 11/05/2017 2112   BILIRUBINUR NEGATIVE 11/05/2017 2112   KETONESUR NEGATIVE 11/05/2017 2112   PROTEINUR NEGATIVE 11/05/2017 2112   NITRITE NEGATIVE 11/05/2017 2112   LEUKOCYTESUR NEGATIVE 11/05/2017 2112   Sepsis Labs: @LABRCNTIP (procalcitonin:4,lacticidven:4)  ) Recent Results (from the past 240 hour(s))  Blood Culture (routine x 2)     Status: None (Preliminary result)   Collection Time: 11/05/17  7:45 PM  Result Value Ref Range Status   Specimen Description BLOOD LEFT HAND  Final   Special Requests   Final    BOTTLES DRAWN AEROBIC AND ANAEROBIC Blood Culture adequate volume   Culture   Final    NO GROWTH < 24 HOURS Performed at [facility], [place] [address], [place], [place] [address]    Report Status PENDING  Incomplete  Blood Culture (routine x 2)     Status: None (Preliminary result)   Collection Time: 11/05/17  8:05 PM  Result Value Ref Range Status   Specimen Description BLOOD RIGHT FOREARM  Final   Special Requests   Final     BOTTLES DRAWN AEROBIC AND ANAEROBIC Blood Culture results may not be optimal due to an inadequate volume of blood received in culture bottles   Culture   Final    NO GROWTH < 24 HOURS Performed at [facility], [place] [address] [place], [place] [address]    Report Status PENDING  Incomplete      Studies: Ct Head Wo Contrast  Result Date: 11/06/2017 CLINICAL DATA:  Increased weakness. Prior stroke with left hemiplegia. EXAM: CT HEAD WITHOUT CONTRAST TECHNIQUE: Contiguous axial images were obtained from the base of the skull through the vertex without intravenous contrast. COMPARISON:  09/23/2017 FINDINGS: Brain: No acute infarct, intracranial hemorrhage, midline shift, or extra-axial fluid collection is identified. A large chronic right MCA infarct is again noted. There are also unchanged small chronic infarcts in the left occipital lobe and left cerebellum. A 10 mm extra-axial mass over the left parietal convexity is unchanged and consistent with a meningioma. Mild chronic small vessel ischemic disease is noted in the cerebral  white matter. Vascular: Calcified atherosclerosis at the skull base. No hyperdense vessel. Skull: No fracture or focal osseous lesion. Sinuses/Orbits: Visualized paranasal sinuses and mastoid air cells are clear. Bilateral cataract extraction is noted. Other: None. IMPRESSION: 1. No evidence of acute intracranial abnormality. 2. Chronic ischemia including a large right MCA infarct. 3. 10 mm left parietal meningioma. Electronically Signed   By: Logan Bores M.D.   On: 11/06/2017 08:32    Scheduled Meds: . apixaban  5 mg Oral BID  . atorvastatin  40 mg Oral q1800  . bethanechol  25 mg Oral TID  . latanoprost  1 drop Both Eyes QHS  . levothyroxine  125 mcg Oral QAC breakfast  . pregabalin  75 mg Oral BID  . tamsulosin  0.4 mg Oral QHS    Continuous Infusions: . sodium chloride 100 mL/hr at 11/06/17 1744  . piperacillin-tazobactam (ZOSYN)  IV 3.375 g (11/06/17 2041)   . piperacillin-tazobactam    . vancomycin 750 mg (11/06/17 1022)     LOS: 1 day     Alma Friendly, MD [facility]  If 7PM-7AM, please contact night-coverage [email or web address] Password [facility] 11/06/2017, 9:44 PM

## 2017-11-06 NOTE — Progress Notes (Signed)
PT Cancellation Note  Patient Details Name: Derek Hays MRN: [id number] DOB: 1930-11-25   Cancelled Treatment:    Reason Eval/Treat Not Completed: Fatigue/lethargy limiting ability to participate. Arousing minimally per MD/RN, requesting PT follow-up tomorrow for evaluation.  Mabeline Caras, PT, DPT Acute Rehab Services  Pager: [place] 11/06/2017, 9:48 AM

## 2017-11-06 NOTE — Progress Notes (Signed)
Pharmacy Antibiotic Note  Derek Hays is a 84 y.o. male admitted on 11/05/2017 with sepsis. Antibiotics are being broadened. Afebrile, WBC 7.8, UA negative, CXR shows small pleural effusion. Of note the LA is 1.4 and ProCal is 1.9.   Plan: Zosyn 3.375G IV q8 Vancomycin 750 mg q12h Monitor renal fx cx vt prn  Height: 6' (182.9 cm) Weight: 203 lb 14.8 oz (92.5 kg) IBW/kg (Calculated) : 77.6  Temp (24hrs), Avg:98.4 F (36.9 C), Min:97.5 F (36.4 C), Max:99.7 F (37.6 C)  Recent Labs  Lab 11/05/17 1945 11/05/17 2001 11/05/17 2013 11/05/17 2214 11/06/17 0213 11/06/17 0509  WBC 13.5*  --   --   --   --  7.8  CREATININE 1.45*  --   --   --   --  1.16  LATICACIDVEN  --  3.03* 2.49* 2.97* 0.9 1.4    Estimated Creatinine Clearance: 52 mL/min (by C-G formula based on SCr of 1.16 mg/dL).    No Known Allergies  Georga Bora, PharmD Clinical Pharmacist 11/06/2017 10:56 AM Please check AMION for all [facility] numbers

## 2017-11-06 NOTE — H&P (Signed)
History and Physical    Derek Hays Derek Hays DOB: 23-Jun-1930 DOA: 11/05/2017  [facility]  Patient coming from: Home.  Chief Complaint: Fever and increased weakness.  HPI: Derek Hays is a 84 y.o. male with history of stroke recently admitted in February with left-sided hemiplegia was noticed by patient's home health aide that patient was more weaker than usual on the left side.  As per the patient he did not find any difference.  Patient also was found to have fever at home of temperature 100 F.  Patient denies any nausea vomiting diarrhea chest pain productive cough or shortness of breath.  Patient was admitted 2 months ago for metabolic encephalopathy in the setting of infection due to pneumonia.  ED Course: In the ER patient was hypotensive febrile with elevated lactate.  Chest x-ray showed some mild pleural effusion UA is unremarkable.  On exam patient has left-sided hemiplegia and per the patient weakness is nothing new from previous.  ER physician discussed with on-call neurologist Dr. Leonel Ramsay who advised no stroke work-up and symptoms may be related to patient's possible developing sepsis and hypotension.  If there is any concern for persistent weakness which has worsened may consider further imaging.  Patient blood pressure improved with fluids.  Was started on empiric antibiotics after blood cultures obtained for possible developing sepsis.  Review of Systems: As per HPI, rest all negative.   Past Medical History:  Diagnosis Date  . Atrial fibrillation ([place])   . Bladder cancer ([place]) 1984  . Hypertension   . Stroke [facility])     Past Surgical History:  Procedure Laterality Date  . IR CT HEAD LTD  05/27/2017  . IR PERCUTANEOUS ART THROMBECTOMY/INFUSION INTRACRANIAL INC DIAG ANGIO  05/27/2017  . PACEMAKER INSERTION    . RADIOLOGY WITH ANESTHESIA N/A 05/27/2017   Procedure: RADIOLOGY WITH ANESTHESIA;  Surgeon: Luanne Bras, MD;  Location:  [facility];  Service: Radiology;  Laterality: N/A;     reports that he quit smoking about 35 years ago. His smoking use included cigarettes. He has a 37.50 pack-year smoking history. He has never used smokeless tobacco. He reports that he drank alcohol. He reports that he does not use drugs.  No Known Allergies  Family History  Problem Relation Age of Onset  . Emphysema Father     Prior to Admission medications   Medication Sig Start Date End Date Taking? Authorizing Provider  albuterol (PROVENTIL) (2.5 MG/3ML) 0.083% nebulizer solution Take 3 mLs (2.5 mg total) by nebulization every 6 (six) hours. Patient taking differently: Take 2.5 mg by nebulization every 6 (six) hours as needed for wheezing or shortness of breath.  08/13/17  Yes Rory Percy, DO  amLODipine (NORVASC) 10 MG tablet Take 1 tablet (10 mg total) by mouth daily. 08/14/17  Yes Rory Percy, DO  apixaban (ELIQUIS) 5 MG TABS tablet Take 1 tablet (5 mg total) by mouth 2 (two) times daily. 06/19/17  Yes Angiulli, Lavon Paganini, PA-C  atorvastatin (LIPITOR) 40 MG tablet Take 1 tablet (40 mg total) by mouth daily at 6 PM. 08/13/17  Yes Rumball, Bryson Ha, DO  benzonatate (TESSALON) 100 MG capsule Take 200 mg by mouth 3 (three) times daily as needed for cough.   Yes [provider]  bethanechol (URECHOLINE) 25 MG tablet Take 1 tablet (25 mg total) by mouth 3 (three) times daily. Patient taking differently: Take 25 mg by mouth 3 (three) times daily. 7:30am, 5pm, 9pm 06/19/17  Yes Angiulli, Lavon Paganini, PA-C  carboxymethylcellulose (REFRESH TEARS) 0.5 % SOLN Place 1 drop into both eyes every 6 (six) hours as needed (dry eyes).   Yes [provider]  latanoprost (XALATAN) 0.005 % ophthalmic solution Place 1 drop into both eyes at bedtime.   Yes [provider]  levothyroxine (SYNTHROID, LEVOTHROID) 125 MCG tablet Take 125 mcg by mouth daily.    Yes [provider]  pregabalin (LYRICA) 75 MG capsule Take 75 mg by mouth  2 (two) times daily.   Yes [provider]  tamsulosin (FLOMAX) 0.4 MG CAPS capsule Take 1 capsule (0.4 mg total) by mouth daily after supper. Patient taking differently: Take 0.4 mg by mouth at bedtime.  06/19/17  Yes Cathlyn Parsons, PA-C    Physical Exam: Vitals:   11/05/17 2345 11/06/17 0000 11/06/17 0015 11/06/17 0100  BP: 128/76 114/65 131/70 110/73  Pulse: (!) 59 (!) 59 60 62  Resp: 20 17 15  (!) 23  Temp:    97.9 F (36.6 C)  TempSrc:    Oral  SpO2: 97% 97% 100% 98%  Weight:    92.5 kg (203 lb 14.8 oz)  Height:    6' (1.829 m)      Constitutional: Moderately built and nourished. Vitals:   11/05/17 2345 11/06/17 0000 11/06/17 0015 11/06/17 0100  BP: 128/76 114/65 131/70 110/73  Pulse: (!) 59 (!) 59 60 62  Resp: 20 17 15  (!) 23  Temp:    97.9 F (36.6 C)  TempSrc:    Oral  SpO2: 97% 97% 100% 98%  Weight:    92.5 kg (203 lb 14.8 oz)  Height:    6' (1.829 m)   Eyes: Anicteric no pallor. ENMT: No discharge from the ears eyes nose or mouth. Neck: No mass felt.  No neck rigidity.  No JVD appreciated. Respiratory: No rhonchi or crepitations. Cardiovascular: S1-S2 heard no murmurs appreciated. Abdomen: Soft nontender bowel sounds present. Musculoskeletal: Mild edema of the both lower extremities. Skin: Chronic skin changes. Neurologic: Alert awake oriented to time place and person.  Has left upper extremity 0 x 5 and left lower extremity is 1 x 5 in strength.  Right upper and lower activities 5 x 5 in strength.  Mild left facial droop.  Pupils are equal and reactive to light. Psychiatric: Appears normal.   Labs on Admission: I have personally reviewed following labs and imaging studies  CBC: Recent Labs  Lab 11/05/17 1945  WBC 13.5*  NEUTROABS 12.2*  HGB 12.7*  HCT 39.3  MCV 89.3  PLT 903   Basic Metabolic Panel: Recent Labs  Lab 11/05/17 1945  NA 140  K 4.8  CL 104  CO2 23  GLUCOSE 131*  BUN 25*  CREATININE 1.45*  CALCIUM 8.8*    GFR: Estimated Creatinine Clearance: 41.6 mL/min (A) (by C-G formula based on SCr of 1.45 mg/dL (H)). Liver Function Tests: Recent Labs  Lab 11/05/17 1945  AST 19  ALT 9  ALKPHOS 67  BILITOT 1.4*  PROT 6.0*  ALBUMIN 2.9*   No results for input(s): LIPASE, AMYLASE in the last 168 hours. No results for input(s): AMMONIA in the last 168 hours. Coagulation Profile: Recent Labs  Lab 11/05/17 1945  INR 1.80   Cardiac Enzymes: No results for input(s): CKTOTAL, CKMB, CKMBINDEX, TROPONINI in the last 168 hours. BNP (last 3 results) No results for input(s): PROBNP in the last 8760 hours. HbA1C: No results for input(s): HGBA1C in the last 72 hours. CBG: Recent Labs  Lab 11/05/17 2243  GLUCAP 114*   Lipid Profile: No results for input(s): CHOL, HDL, LDLCALC, TRIG, CHOLHDL, LDLDIRECT in the last 72 hours. Thyroid Function Tests: No results for input(s): TSH, T4TOTAL, FREET4, T3FREE, THYROIDAB in the last 72 hours. Anemia Panel: No results for input(s): VITAMINB12, FOLATE, FERRITIN, TIBC, IRON, RETICCTPCT in the last 72 hours. Urine analysis:    Component Value Date/Time   COLORURINE YELLOW 11/05/2017 2112   APPEARANCEUR HAZY (A) 11/05/2017 2112   LABSPEC 1.016 11/05/2017 2112   PHURINE 5.0 11/05/2017 2112   GLUCOSEU NEGATIVE 11/05/2017 2112   HGBUR NEGATIVE 11/05/2017 2112   BILIRUBINUR NEGATIVE 11/05/2017 2112   [place] NEGATIVE 11/05/2017 2112   PROTEINUR NEGATIVE 11/05/2017 2112   NITRITE NEGATIVE 11/05/2017 2112   LEUKOCYTESUR NEGATIVE 11/05/2017 2112   Sepsis Labs: @LABRCNTIP (procalcitonin:4,lacticidven:4) )No results found for this or any previous visit (from the past 240 hour(s)).   Radiological Exams on Admission: Dg Chest 2 View  Result Date: 11/05/2017 CLINICAL DATA:  Difficulty raising the left arm, drank water and did not agree with him, history of atrial fibrillation and hypertension EXAM: CHEST - 2 VIEW COMPARISON:  09/23/2017, 08/11/2017 FINDINGS:  Left-sided pacing device as before. Mild cardiomegaly. No acute airspace disease. Small pleural effusions. Aortic atherosclerosis. No pneumothorax. IMPRESSION: Small pleural effusions and cardiomegaly. Electronically Signed   By: Donavan Foil M.D.   On: 11/05/2017 20:48    EKG: Independently reviewed.  Paced rhythm.  Assessment/Plan Principal Problem:   SIRS (systemic inflammatory response syndrome) (HCC) Active Problems:   Right middle cerebral artery stroke (HCC)   Stage 3 chronic kidney disease (HCC)   PAF (paroxysmal atrial fibrillation) ([place])    1. SIRS with possible developing sepsis source not clear.  Patient on empiric antibiotics follow cultures lactate procalcitonin levels continue with gentle hydration. 2. Weakness with history of stroke with left-sided hemiplegia.  Will get CT head.  Unable to do MRI due to pacemaker. 3. Acute renal failure.  Probably secondary to hypotension.  Hold antihypertensive gently hydrate closely monitor for intake output metabolic panel respiratory status. 4. Paroxysmal atrial fibrillation rate controlled.  On apixaban. 5. -Hypertension holding antihypertensives due to sepsis picture. 6. History of stroke with left-sided hemiplegia see #2.  On apixaban and statins.   DVT prophylaxis: Apixaban. Code Status: Full code.   Family Communication: No family at the bedside. Disposition Plan: To be determined. Consults called: Physical therapy. Admission status: Inpatient.   Rise Patience MD [facility] Pager [phone].  If 7PM-7AM, please contact night-coverage [email or web address] Password [email or web address]  11/06/2017, 2:16 AM

## 2017-11-06 NOTE — Discharge Instructions (Signed)

## 2017-11-07 DIAGNOSIS — R6510 Systemic inflammatory response syndrome (SIRS) of non-infectious origin without acute organ dysfunction: Secondary | ICD-10-CM

## 2017-11-07 DIAGNOSIS — N183 Chronic kidney disease, stage 3 (moderate): Secondary | ICD-10-CM

## 2017-11-07 DIAGNOSIS — I480 Paroxysmal atrial fibrillation: Secondary | ICD-10-CM

## 2017-11-07 LAB — CBC WITH DIFFERENTIAL/PLATELET
ABS IMMATURE GRANULOCYTES: 0 10*3/uL (ref 0.0–0.1)
Basophils Absolute: 0 10*3/uL (ref 0.0–0.1)
Basophils Relative: 0 %
EOS ABS: 0.6 10*3/uL (ref 0.0–0.7)
Eosinophils Relative: 10 %
HEMATOCRIT: 33.1 % — AB (ref 39.0–52.0)
Hemoglobin: 10.7 g/dL — ABNORMAL LOW (ref 13.0–17.0)
IMMATURE GRANULOCYTES: 0 %
LYMPHS PCT: 12 %
Lymphs Abs: 0.8 10*3/uL (ref 0.7–4.0)
MCH: 28.9 pg (ref 26.0–34.0)
MCHC: 32.3 g/dL (ref 30.0–36.0)
MCV: 89.5 fL (ref 78.0–100.0)
Monocytes Absolute: 0.6 10*3/uL (ref 0.1–1.0)
Monocytes Relative: 9 %
NEUTROS ABS: 4.3 10*3/uL (ref 1.7–7.7)
NEUTROS PCT: 69 %
PLATELETS: 131 10*3/uL — AB (ref 150–400)
RBC: 3.7 MIL/uL — AB (ref 4.22–5.81)
RDW: 16.3 % — AB (ref 11.5–15.5)
WBC: 6.3 10*3/uL (ref 4.0–10.5)

## 2017-11-07 LAB — BASIC METABOLIC PANEL
ANION GAP: 6 (ref 5–15)
BUN: 23 mg/dL (ref 8–23)
CALCIUM: 8 mg/dL — AB (ref 8.9–10.3)
CO2: 23 mmol/L (ref 22–32)
Chloride: 110 mmol/L (ref 98–111)
Creatinine, Ser: 1.24 mg/dL (ref 0.61–1.24)
GFR, EST AFRICAN AMERICAN: 60 mL/min — AB (ref >60)
GFR, EST NON AFRICAN AMERICAN: 52 mL/min — AB (ref >60)
Glucose, Bld: 85 mg/dL (ref 70–99)
POTASSIUM: 3.7 mmol/L (ref 3.5–5.1)
Sodium: 139 mmol/L (ref 135–145)

## 2017-11-07 LAB — URINE CULTURE: Culture: NO GROWTH

## 2017-11-07 LAB — MAGNESIUM: Magnesium: 1.8 mg/dL (ref 1.7–2.4)

## 2017-11-07 LAB — PROCALCITONIN: Procalcitonin: 1.78 ng/mL

## 2017-11-07 NOTE — Evaluation (Signed)
Physical Therapy Evaluation Patient Details Name: Derek Hays MRN: [id number] DOB: 24-Mar-1931 Today's Date: 11/07/2017   History of Present Illness  Derek Hays is a 84 y.o. male with history of CVA, afib, HTN, recently admitted in February with left-sided hemiplegia was noticed by patient's home health aide that patient was more weaker than usual on the left side.  As per the patient he did not find any difference. Patient also was found to have fever at home of temperature 100 F, denies any other symptoms. In the ER patient was hypotensive, febrile with elevated lactate.  Chest x-ray showed some mild pleural effusion, UA is unremarkable. ER physician discussed with on-call neurologist Dr. Leonel Ramsay who advised no stroke work-up and symptoms may be related to patient's possible developing sepsis and hypotension.   Clinical Impression  Pt admitted with above diagnosis. Pt currently with functional limitations due to the deficits listed below (see PT Problem List). Pt was able tosit EOB for up to 10 minutes with min guard to mod assist with challenges.  Will follow acutely.  Wife and caregiver provide 24 hour care.   Pt will benefit from skilled PT to increase their independence and safety with mobility to allow discharge to the venue listed below.    Follow Up Recommendations Home health PT;Supervision/Assistance - 24 hour    Equipment Recommendations  None recommended by PT    Recommendations for Other Services       Precautions / Restrictions Precautions Precautions: Fall Required Braces or Orthoses: Sling Restrictions Weight Bearing Restrictions: No      Mobility  Bed Mobility Overal bed mobility: Needs Assistance Bed Mobility: Supine to Sit     Supine to sit: Max assist;+2 for physical assistance;HOB elevated     General bed mobility comments: Pt needed max assist for LEs off bed and to elevate trunk.   Transfers                 General transfer comment:  unable to stand as pt did not want to attempt today  Ambulation/Gait                Stairs            Wheelchair Mobility    Modified Rankin (Stroke Patients Only)       Balance Overall balance assessment: Needs assistance Sitting-balance support: Single extremity supported;Feet supported Sitting balance-Leahy Scale: Fair Sitting balance - Comments: Pt could sit without UE support but could not withstand any challenges to balance without needing mod assist.  When pt performing exercises, pt leaning posteriorly and needing steadying support.                                      Pertinent Vitals/Pain Pain Assessment: No/denies pain    Home Living Family/patient expects to be discharged to:: Private residence Living Arrangements: Spouse/significant other Available Help at Discharge: Family;Personal care attendant;Available 24 hours/day Type of Home: House Home Access: Stairs to enter;Ramped entrance   Entrance Stairs-Number of Steps: 1 Home Layout: One level Home Equipment: Bedside commode;Grab bars - tub/shower;Grab bars - toilet;Walker - 2 wheels;Cane - single point;Crutches;Wheelchair - Dealer)      Prior Function Level of Independence: Needs assistance   Gait / Transfers Assistance Needed: Per nurse, pt has 24 hour care by wife and caregiver and they use a hoyer lift and get pt to wheelchair each day.  They use lift to get pt everywhere  ADL's / Homemaking Assistance Needed: max to total assist   Comments: Pt states he worked on sitting and standing with therapists in the home     Hand Dominance   Dominant Hand: Right    Extremity/Trunk Assessment   Upper Extremity Assessment Upper Extremity Assessment: Defer to OT evaluation    Lower Extremity Assessment Lower Extremity Assessment: LLE deficits/detail;RLE deficits/detail RLE Deficits / Details: grossly 3-/5 LLE Deficits / Details: grossly 1/5    Cervical / Trunk  Assessment Cervical / Trunk Assessment: Normal  Communication   Communication: HOH  Cognition Arousal/Alertness: Awake/alert Behavior During Therapy: Anxious Overall Cognitive Status: Within Functional Limits for tasks assessed                                        General Comments      Exercises General Exercises - Lower Extremity Ankle Circles/Pumps: AAROM;Both;5 reps;Seated Long Arc Quad: AAROM;Both;10 reps;Seated Hip Flexion/Marching: Right;AROM;5 reps;Seated   Assessment/Plan    PT Assessment Patient needs continued PT services  PT Problem List Decreased activity tolerance;Decreased strength;Decreased balance;Decreased mobility;Decreased knowledge of use of DME;Decreased safety awareness;Decreased knowledge of precautions       PT Treatment Interventions DME instruction;Gait training;Functional mobility training;Therapeutic activities;Therapeutic exercise;Balance training;Patient/family education;Neuromuscular re-education;Wheelchair mobility training    PT Goals (Current goals can be found in the Care Plan section)  Acute Rehab PT Goals Patient Stated Goal: to go home and contineu therapy PT Goal Formulation: With patient Time For Goal Achievement: 11/21/17 Potential to Achieve Goals: Good    Frequency Min 3X/week   Barriers to discharge        Co-evaluation               AM-PAC PT "6 Clicks" Daily Activity  Outcome Measure Difficulty turning over in bed (including adjusting bedclothes, sheets and blankets)?: Unable Difficulty moving from lying on back to sitting on the side of the bed? : Unable Difficulty sitting down on and standing up from a chair with arms (e.g., wheelchair, bedside commode, etc,.)?: Unable Help needed moving to and from a bed to chair (including a wheelchair)?: Total Help needed walking in hospital room?: Total Help needed climbing 3-5 steps with a railing? : Total 6 Click Score: 6    End of Session Equipment  Utilized During Treatment: Gait belt Activity Tolerance: Patient limited by fatigue Patient left: in bed;with call bell/phone within reach;with bed alarm set Nurse Communication: Mobility status;Need for lift equipment PT Visit Diagnosis: Unsteadiness on feet (R26.81);Muscle weakness (generalized) (M62.81);Hemiplegia and hemiparesis Hemiplegia - Right/Left: Left Hemiplegia - dominant/non-dominant: Non-dominant Hemiplegia - caused by: Cerebral infarction    Time: 0911-0930 PT Time Calculation (min) (ACUTE ONLY): 19 min   Charges:   PT Evaluation $PT Eval Moderate Complexity: Edisto Eh Sesay,PT Acute Rehabilitation [phone] (pager)   Denice Paradise 11/07/2017, 11:22 AM

## 2017-11-07 NOTE — Progress Notes (Signed)
PROGRESS NOTE  [name]:[id number] DOB: 03/12/1931 DOA: 11/05/2017 [facility]  HPI/Recap of past 24 hours: Derek Hays is a 84 y.o. male with history of CVA, afib, HTN, recently admitted in February with left-sided hemiplegia was noticed by patient's home health aide that patient was more weaker than usual on the left side.  As per the patient he did not find any difference. Patient also was found to have fever at home of temperature 100 F, denies any other symptoms. In the ER patient was hypotensive, febrile with elevated lactate.  Chest x-ray showed some mild pleural effusion, UA is unremarkable. ER physician discussed with on-call neurologist Dr. Leonel Ramsay who advised no stroke work-up and symptoms may be related to patient's possible developing sepsis and hypotension. Patient blood pressure improved with fluids.  Was started on empiric antibiotics after blood cultures obtained for possible developing sepsis.  Today, pt noted to very alert, awake, oriented as compared to yesterday. Denies any new complaints.     Assessment/Plan: Principal Problem:   SIRS (systemic inflammatory response syndrome) (HCC) Active Problems:   Right middle cerebral artery stroke (HCC)   Stage 3 chronic kidney disease (HCC)   PAF (paroxysmal atrial fibrillation) (HCC)  Sepsis, of unknown origin Febrile, hypotensive, lactic acidosis, on admission Afebrile, with resolved leukocytosis Lactic acidosis resolved Procalcitonin elevated UA negative, UC no growth BC X 2 NGTD CXR small pleural effusion, no consolidation noted Continue Vancomycin and Zosyn  Acute metabolic encephalopathy Likely due to above CT head showed no evidence of acute intracranial abnormality, chronic ischemia including a large R MCA infarct  Hx of CVA with residual left sided weakness CT as above Monitor closely  AKI Resolving  Paroxysmal Afib Rate controlled Continue eliquis  HTN Soft  BP  Hypothyroidism Continue synthroid  HLD Continue lipitor     Code Status: Full  Family Communication: None at bedside  Disposition Plan: Once work up complete   Consultants:  None   Procedures:  None  Antimicrobials: Vancomycin Zosyn   DVT prophylaxis: Eliquis   Objective: Vitals:   11/07/17 0341 11/07/17 0440 11/07/17 0800 11/07/17 1222  BP: 130/68  122/75 106/70  Pulse: (!) 59  60 60  Resp: (!) 21  14 17   Temp: 98.3 F (36.8 C)  98 F (36.7 C) 98.2 F (36.8 C)  TempSrc: Oral  Axillary Oral  SpO2: 94%  97% 99%  Weight:  94.4 kg (208 lb 1.8 oz)    Height:        Intake/Output Summary (Last 24 hours) at 11/07/2017 1636 Last data filed at 11/07/2017 0630 Gross per 24 hour  Intake 3445.21 ml  Output 750 ml  Net 2695.21 ml   Filed Weights   11/05/17 1858 11/06/17 0100 11/07/17 0440  Weight: 95.3 kg (210 lb) 92.5 kg (203 lb 14.8 oz) 94.4 kg (208 lb 1.8 oz)    Exam:   General: NAD, alert,oriented  Cardiovascular: S1, S2 present   Respiratory: Diminished BS   Abdomen: Soft, NT, ND, BS present  Musculoskeletal: No pedal edema b/l  Skin: Normal  Psychiatry: Normal mood   Data Reviewed: CBC: Recent Labs  Lab 11/05/17 1945 11/06/17 0509 11/07/17 0337  WBC 13.5* 7.8 6.3  NEUTROABS 12.2* 6.0 4.3  HGB 12.7* 11.3* 10.7*  HCT 39.3 34.8* 33.1*  MCV 89.3 89.2 89.5  PLT 188 131* 811*   Basic Metabolic Panel: Recent Labs  Lab 11/05/17 1945 11/06/17 0509 11/07/17 0337  NA 140 141 139  K  4.8 3.2* 3.7  CL 104 112* 110  CO2 23 23 23   GLUCOSE 131* 98 85  BUN 25* 21 23  CREATININE 1.45* 1.16 1.24  CALCIUM 8.8* 7.2* 8.0*  MG  --   --  1.8   GFR: Estimated Creatinine Clearance: 52.9 mL/min (by C-G formula based on SCr of 1.24 mg/dL). Liver Function Tests: Recent Labs  Lab 11/05/17 1945 11/06/17 0509  AST 19 14*  ALT 9 9  ALKPHOS 67 51  BILITOT 1.4* 0.9  PROT 6.0* 4.4*  ALBUMIN 2.9* 2.2*   No results for input(s): LIPASE,  AMYLASE in the last 168 hours. No results for input(s): AMMONIA in the last 168 hours. Coagulation Profile: Recent Labs  Lab 11/05/17 1945 11/06/17 0509  INR 1.80 1.81   Cardiac Enzymes: No results for input(s): CKTOTAL, CKMB, CKMBINDEX, TROPONINI in the last 168 hours. BNP (last 3 results) No results for input(s): PROBNP in the last 8760 hours. HbA1C: No results for input(s): HGBA1C in the last 72 hours. CBG: Recent Labs  Lab 11/05/17 2243  GLUCAP 114*   Lipid Profile: No results for input(s): CHOL, HDL, LDLCALC, TRIG, CHOLHDL, LDLDIRECT in the last 72 hours. Thyroid Function Tests: No results for input(s): TSH, T4TOTAL, FREET4, T3FREE, THYROIDAB in the last 72 hours. Anemia Panel: No results for input(s): VITAMINB12, FOLATE, FERRITIN, TIBC, IRON, RETICCTPCT in the last 72 hours. Urine analysis:    Component Value Date/Time   COLORURINE YELLOW 11/05/2017 2112   APPEARANCEUR HAZY (A) 11/05/2017 2112   LABSPEC 1.016 11/05/2017 2112   PHURINE 5.0 11/05/2017 2112   GLUCOSEU NEGATIVE 11/05/2017 2112   HGBUR NEGATIVE 11/05/2017 2112   BILIRUBINUR NEGATIVE 11/05/2017 2112   KETONESUR NEGATIVE 11/05/2017 2112   PROTEINUR NEGATIVE 11/05/2017 2112   NITRITE NEGATIVE 11/05/2017 2112   LEUKOCYTESUR NEGATIVE 11/05/2017 2112   Sepsis Labs: @LABRCNTIP (procalcitonin:4,lacticidven:4)  ) Recent Results (from the past 240 hour(s))  Blood Culture (routine x 2)     Status: None (Preliminary result)   Collection Time: 11/05/17  7:45 PM  Result Value Ref Range Status   Specimen Description BLOOD LEFT HAND  Final   Special Requests   Final    BOTTLES DRAWN AEROBIC AND ANAEROBIC Blood Culture adequate volume   Culture   Final    NO GROWTH 2 DAYS Performed at [facility], [place] [address] [place], [place] [address]    Report Status PENDING  Incomplete  Blood Culture (routine x 2)     Status: None (Preliminary result)   Collection Time: 11/05/17  8:05 PM  Result Value Ref  Range Status   Specimen Description BLOOD RIGHT FOREARM  Final   Special Requests   Final    BOTTLES DRAWN AEROBIC AND ANAEROBIC Blood Culture results may not be optimal due to an inadequate volume of blood received in culture bottles   Culture   Final    NO GROWTH 2 DAYS Performed at [facility], [place] [address] [place], [place] [address]    Report Status PENDING  Incomplete  Urine culture     Status: None   Collection Time: 11/05/17  9:12 PM  Result Value Ref Range Status   Specimen Description URINE, CATHETERIZED  Final   Special Requests NONE  Final   Culture   Final    NO GROWTH Performed at [facility], [address], [place], [place] [address]    Report Status 11/07/2017 FINAL  Final      Studies: No results  found.  Scheduled Meds: . apixaban  5 mg Oral BID  . atorvastatin  40 mg Oral q1800  . bethanechol  25 mg Oral TID  . latanoprost  1 drop Both Eyes QHS  . levothyroxine  125 mcg Oral QAC breakfast  . pregabalin  75 mg Oral BID  . tamsulosin  0.4 mg Oral QHS    Continuous Infusions: . piperacillin-tazobactam (ZOSYN)  IV 3.375 g (11/07/17 1417)  . piperacillin-tazobactam    . vancomycin 750 mg (11/07/17 1022)     LOS: 2 days     Alma Friendly, MD [facility]  If 7PM-7AM, please contact night-coverage [email or web address] Password [facility] 11/07/2017, 4:36 PM

## 2017-11-08 ENCOUNTER — Inpatient Hospital Stay (HOSPITAL_COMMUNITY): Payer: Medicare Other

## 2017-11-08 DIAGNOSIS — I482 Chronic atrial fibrillation: Secondary | ICD-10-CM

## 2017-11-08 DIAGNOSIS — I4821 Permanent atrial fibrillation: Secondary | ICD-10-CM

## 2017-11-08 LAB — BASIC METABOLIC PANEL
Anion gap: 8 (ref 5–15)
BUN: 21 mg/dL (ref 8–23)
CO2: 24 mmol/L (ref 22–32)
Calcium: 8.6 mg/dL — ABNORMAL LOW (ref 8.9–10.3)
Chloride: 107 mmol/L (ref 98–111)
Creatinine, Ser: 1.23 mg/dL (ref 0.61–1.24)
GFR calc Af Amer: >60 mL/min (ref >60)
GFR calc non Af Amer: 52 mL/min — ABNORMAL LOW (ref >60)
Glucose, Bld: 82 mg/dL (ref 70–99)
Potassium: 3.8 mmol/L (ref 3.5–5.1)
Sodium: 139 mmol/L (ref 135–145)

## 2017-11-08 LAB — CBC WITH DIFFERENTIAL/PLATELET
Abs Immature Granulocytes: 0 10*3/uL (ref 0.0–0.1)
Basophils Absolute: 0 10*3/uL (ref 0.0–0.1)
Basophils Relative: 1 %
EOS ABS: 0.7 10*3/uL (ref 0.0–0.7)
Eosinophils Relative: 12 %
HCT: 34.1 % — ABNORMAL LOW (ref 39.0–52.0)
Hemoglobin: 11.1 g/dL — ABNORMAL LOW (ref 13.0–17.0)
Immature Granulocytes: 0 %
Lymphocytes Relative: 19 %
Lymphs Abs: 1.1 10*3/uL (ref 0.7–4.0)
MCH: 28.5 pg (ref 26.0–34.0)
MCHC: 32.6 g/dL (ref 30.0–36.0)
MCV: 87.7 fL (ref 78.0–100.0)
MONOS PCT: 9 %
Monocytes Absolute: 0.5 10*3/uL (ref 0.1–1.0)
Neutro Abs: 3.4 10*3/uL (ref 1.7–7.7)
Neutrophils Relative %: 59 %
Platelets: 145 10*3/uL — ABNORMAL LOW (ref 150–400)
RBC: 3.89 MIL/uL — ABNORMAL LOW (ref 4.22–5.81)
RDW: 15.9 % — AB (ref 11.5–15.5)
WBC: 5.7 10*3/uL (ref 4.0–10.5)

## 2017-11-08 LAB — MRSA PCR SCREENING: MRSA BY PCR: NEGATIVE

## 2017-11-08 LAB — PROCALCITONIN: Procalcitonin: 1.59 ng/mL

## 2017-11-08 LAB — VANCOMYCIN, TROUGH: Vancomycin Tr: 19 ug/mL (ref 15–20)

## 2017-11-08 MED ORDER — FUROSEMIDE 10 MG/ML IJ SOLN
20.0000 mg | Freq: Once | INTRAMUSCULAR | Status: AC
  Administered 2017-11-08: 20 mg via INTRAVENOUS
  Filled 2017-11-08: qty 2

## 2017-11-08 MED ORDER — SODIUM CHLORIDE 0.9 % IV SOLN
2.0000 g | Freq: Two times a day (BID) | INTRAVENOUS | Status: DC
  Administered 2017-11-08 – 2017-11-11 (×6): 2 g via INTRAVENOUS
  Filled 2017-11-08 (×6): qty 2

## 2017-11-08 MED ORDER — FUROSEMIDE 10 MG/ML IJ SOLN
20.0000 mg | Freq: Every day | INTRAMUSCULAR | Status: DC
  Administered 2017-11-09 – 2017-11-11 (×3): 20 mg via INTRAVENOUS
  Filled 2017-11-08 (×3): qty 2

## 2017-11-08 NOTE — Care Management Note (Signed)
Case Management Note Marvetta Gibbons RN, BSN Unit 4E-Case Manager [phone]  Patient Details  Name: Derek Hays MRN: [id number] Date of Birth: 1930/09/03  Subjective/Objective:   Pt admitted with SIRS                 Action/Plan: PTA pt lived at home with wife, active with [facility] for HHRN/PT/OT/ST/aide, will need resumption orders for discharge if pt returns home- pt has lift and w/c at home- no other DME recommended at this time. - CM will follow for transition of care needs. [facility] is aware of admission  Expected Discharge Date:                  Expected Discharge Plan:  [facility]  In-House Referral:     Discharge planning Services  CM Consult  Post Acute Care Choice:  [place], Resumption of Svcs/PTA Provider Choice offered to:  Patient, Spouse  DME Arranged:    DME Agency:     HH Arranged:    [place] Agency:  [facility] [employer]  Status of Service:  In process, will continue to follow  If discussed at Long Length of Stay Meetings, dates discussed:    Discharge Disposition: home/home health   Additional Comments:  Dawayne Patricia, RN 11/08/2017, 9:43 AM

## 2017-11-08 NOTE — Progress Notes (Signed)
Device interrogated for bradycardia seen on telemetry. Normal device function. Thresholds, sensing, impedances stable. Pt in permanent atrial fibrillation. Good safety margins.  RV sensitivity changed from 0.36mV to 1.24mV to be sure no over saturation of sensor causing oversensing (not seen today). Pt has follow up with the [facility]. No other changes.  Please call with questions.   Chanetta Marshall, NP 11/08/2017 11:40 AM

## 2017-11-08 NOTE — Progress Notes (Signed)
Pharmacy Antibiotic Note  Derek Hays is a 84 y.o. male admitted on 11/05/2017 with sepsis. Antibiotics are being broadened. Patient currently afebrile, WBC 5.7, UA negative, CXR shows small pleural effusion. Of note the LA is 1.4 and ProCal is 1.59. Renal function stable.   Vancomycin trough therapeutic at 19 ug/mL (goal 15-20).   Plan: Zosyn 3.375G IV q8 Vancomycin 750 mg q12h Monitor renal function, cultures, and patient overall status.   Height: 6' (182.9 cm) Weight: 209 lb 7 oz (95 kg) IBW/kg (Calculated) : 77.6  Temp (24hrs), Avg:98.1 F (36.7 C), Min:97.7 F (36.5 C), Max:98.3 F (36.8 C)  Recent Labs  Lab 11/05/17 1945 11/05/17 2001 11/05/17 2013 11/05/17 2214 11/06/17 0213 11/06/17 0509 11/07/17 0337 11/08/17 0317 11/08/17 0953  WBC 13.5*  --   --   --   --  7.8 6.3 5.7  --   CREATININE 1.45*  --   --   --   --  1.16 1.24 1.23  --   LATICACIDVEN  --  3.03* 2.49* 2.97* 0.9 1.4  --   --   --   VANCOTROUGH  --   --   --   --   --   --   --   --  19    Estimated Creatinine Clearance: 53.5 mL/min (by C-G formula based on SCr of 1.23 mg/dL).    No Known Allergies  Gwenlyn Found, [place] D PGY1 Pharmacy Resident  Phone [phone] 11/08/2017   10:36 AM

## 2017-11-08 NOTE — Progress Notes (Signed)
PROGRESS NOTE  LLOYD AYO XBJ:[id number] DOB: [birth year] DOA: 11/05/2017 [facility]  HPI/Recap of past 24 hours: Derek Hays is a 84 y.o. male with history of CVA, afib, HTN, recently admitted in February with left-sided hemiplegia was noticed by patient's home health aide that patient was more weaker than usual on the left side.  As per the patient he did not find any difference. Patient also was found to have fever at home of temperature 100 F, denies any other symptoms. In the ER patient was hypotensive, febrile with elevated lactate.  Chest x-ray showed some mild pleural effusion, UA is unremarkable. ER physician discussed with on-call neurologist Dr. Leonel Ramsay who advised no stroke work-up and symptoms may be related to patient's possible developing sepsis and hypotension. Patient blood pressure improved with fluids.  Was started on empiric antibiotics after blood cultures obtained for possible developing sepsis.  Today, had a conversation with patient, answered all his questions. Denies any new complaints.     Assessment/Plan: Principal Problem:   SIRS (systemic inflammatory response syndrome) (HCC) Active Problems:   Right middle cerebral artery stroke (HCC)   Stage 3 chronic kidney disease (HCC)   PAF (paroxysmal atrial fibrillation) (HCC)   Permanent atrial fibrillation (HCC)  Sepsis likely due to HCAP Improving Febrile, hypotensive, lactic acidosis, on admission Afebrile, with resolved leukocytosis Lactic acidosis resolved Procalcitonin elevated, slowly down trending UA negative, UC no growth BC X 2 NGTD CXR small pleural effusion, no consolidation noted, repeat suggestive of pulmonary congestion Vs worsening bibasilar opacities, atelectasis, Vs infiltrates Continue Vancomycin and start cefepime  Acute metabolic encephalopathy Resolved Likely due to above CT head showed no evidence of acute intracranial abnormality, chronic ischemia  including a large R MCA infarct  ?Acute on chronic diastolic HF BNP pending CXR with worsening congestion ECHO 05/2017 showed: EF of 60-65%, Grade 2 DD Start IV lasix 20 mg daily due to soft BP  Hx of CVA with residual left sided weakness CT as above Monitor closely  AKI Resolving  Paroxysmal Afib Rate controlled Continue eliquis  HTN Stable  Hypothyroidism Continue synthroid  HLD Continue lipitor     Code Status: Full  Family Communication: None at bedside  Disposition Plan: Once work up complete, likely SNF as wife cant take care of him at home for now   Consultants:  None   Procedures:  None  Antimicrobials: Vancomycin Cefepime  DVT prophylaxis: Eliquis   Objective: Vitals:   11/08/17 0437 11/08/17 0830 11/08/17 1128 11/08/17 1512  BP: 107/63 (!) 128/99 131/74 135/86  Pulse: (!) 59 (!) 54 (!) 59 60  Resp: 13   16  Temp: 97.7 F (36.5 C)  (!) 97.5 F (36.4 C) 98.1 F (36.7 C)  TempSrc: Oral  Oral Oral  SpO2: 96% 94% 96% 100%  Weight:      Height:        Intake/Output Summary (Last 24 hours) at 11/08/2017 1605 Last data filed at 11/08/2017 1306 Gross per 24 hour  Intake 2177.57 ml  Output 2300 ml  Net -122.43 ml   Filed Weights   11/06/17 0100 11/07/17 0440 11/08/17 0121  Weight: 92.5 kg (203 lb 14.8 oz) 94.4 kg (208 lb 1.8 oz) 95 kg (209 lb 7 oz)    Exam:   General: NAD, alert,oriented  Cardiovascular: S1, S2 present   Respiratory: Diminished BS   Abdomen: Soft, NT, ND, BS present  Musculoskeletal: 2+ LLE edema, trace RLE edema  Skin: Normal  Psychiatry: Normal mood  Data Reviewed: CBC: Recent Labs  Lab 11/05/17 1945 11/06/17 0509 11/07/17 0337 11/08/17 0317  WBC 13.5* 7.8 6.3 5.7  NEUTROABS 12.2* 6.0 4.3 3.4  HGB 12.7* 11.3* 10.7* 11.1*  HCT 39.3 34.8* 33.1* 34.1*  MCV 89.3 89.2 89.5 87.7  PLT 188 131* 131* 973*   Basic Metabolic Panel: Recent Labs  Lab 11/05/17 1945 11/06/17 0509 11/07/17 0337  11/08/17 0317  NA 140 141 139 139  K 4.8 3.2* 3.7 3.8  CL 104 112* 110 107  CO2 23 23 23 24   GLUCOSE 131* 98 85 82  BUN 25* 21 23 21   CREATININE 1.45* 1.16 1.24 1.23  CALCIUM 8.8* 7.2* 8.0* 8.6*  MG  --   --  1.8  --    GFR: Estimated Creatinine Clearance: 53.5 mL/min (by C-G formula based on SCr of 1.23 mg/dL). Liver Function Tests: Recent Labs  Lab 11/05/17 1945 11/06/17 0509  AST 19 14*  ALT 9 9  ALKPHOS 67 51  BILITOT 1.4* 0.9  PROT 6.0* 4.4*  ALBUMIN 2.9* 2.2*   No results for input(s): LIPASE, AMYLASE in the last 168 hours. No results for input(s): AMMONIA in the last 168 hours. Coagulation Profile: Recent Labs  Lab 11/05/17 1945 11/06/17 0509  INR 1.80 1.81   Cardiac Enzymes: No results for input(s): CKTOTAL, CKMB, CKMBINDEX, TROPONINI in the last 168 hours. BNP (last 3 results) No results for input(s): PROBNP in the last 8760 hours. HbA1C: No results for input(s): HGBA1C in the last 72 hours. CBG: Recent Labs  Lab 11/05/17 2243  GLUCAP 114*   Lipid Profile: No results for input(s): CHOL, HDL, LDLCALC, TRIG, CHOLHDL, LDLDIRECT in the last 72 hours. Thyroid Function Tests: No results for input(s): TSH, T4TOTAL, FREET4, T3FREE, THYROIDAB in the last 72 hours. Anemia Panel: No results for input(s): VITAMINB12, FOLATE, FERRITIN, TIBC, IRON, RETICCTPCT in the last 72 hours. Urine analysis:    Component Value Date/Time   COLORURINE YELLOW 11/05/2017 2112   APPEARANCEUR HAZY (A) 11/05/2017 2112   LABSPEC 1.016 11/05/2017 2112   PHURINE 5.0 11/05/2017 2112   GLUCOSEU NEGATIVE 11/05/2017 2112   HGBUR NEGATIVE 11/05/2017 2112   BILIRUBINUR NEGATIVE 11/05/2017 2112   KETONESUR NEGATIVE 11/05/2017 2112   PROTEINUR NEGATIVE 11/05/2017 2112   NITRITE NEGATIVE 11/05/2017 2112   LEUKOCYTESUR NEGATIVE 11/05/2017 2112   Sepsis Labs: @LABRCNTIP (procalcitonin:4,lacticidven:4)  ) Recent Results (from the past 240 hour(s))  Blood Culture (routine x 2)      Status: None (Preliminary result)   Collection Time: 11/05/17  7:45 PM  Result Value Ref Range Status   Specimen Description BLOOD LEFT HAND  Final   Special Requests   Final    BOTTLES DRAWN AEROBIC AND ANAEROBIC Blood Culture adequate volume   Culture   Final    NO GROWTH 3 DAYS Performed at [facility], [place] [address] [place], [place] [address]    Report Status PENDING  Incomplete  Blood Culture (routine x 2)     Status: None (Preliminary result)   Collection Time: 11/05/17  8:05 PM  Result Value Ref Range Status   Specimen Description BLOOD RIGHT FOREARM  Final   Special Requests   Final    BOTTLES DRAWN AEROBIC AND ANAEROBIC Blood Culture results may not be optimal due to an inadequate volume of blood received in culture bottles   Culture   Final    NO GROWTH 3 DAYS Performed at [facility], [place] [address], [place], [place] [address]  Report Status PENDING  Incomplete  Urine culture     Status: None   Collection Time: 11/05/17  9:12 PM  Result Value Ref Range Status   Specimen Description URINE, CATHETERIZED  Final   Special Requests NONE  Final   Culture   Final    NO GROWTH Performed at [facility], [address], [place], [place] [address]    Report Status 11/07/2017 FINAL  Final      Studies: Dg Chest Port 1 View  Result Date: 11/08/2017 CLINICAL DATA:  Sepsis EXAM: PORTABLE CHEST 1 VIEW COMPARISON:  11/05/2017; 09/23/2017; 08/11/2017 FINDINGS: Grossly unchanged enlarged cardiac silhouette and mediastinal contours with atherosclerotic plaque within thoracic aorta. Stable position of support apparatus. The lungs remain hyperexpanded with flattening the diaphragms and thinning of pulmonary parenchyma within the bilateral lung apices. Pulmonary vasculature appears less distinct on the present examination with cephalization of flow. Ill-defined heterogeneous opacities are seen within the right mid and lower lung as well as the left lower lung. Trace  bilateral pleural effusions are suspected. No pneumothorax. No acute osseus abnormalities. Degenerative change of the bilateral acromioclavicular and left glenohumeral joints, incompletely evaluated. IMPRESSION: Findings most suggestive of pulmonary edema with suspected trace bilateral effusions and worsening bibasilar opacities, atelectasis versus infiltrate. Electronically Signed   By: Sandi Mariscal M.D.   On: 11/08/2017 08:12    Scheduled Meds: . apixaban  5 mg Oral BID  . atorvastatin  40 mg Oral q1800  . bethanechol  25 mg Oral TID  . [START ON 11/09/2017] furosemide  20 mg Intravenous Daily  . latanoprost  1 drop Both Eyes QHS  . levothyroxine  125 mcg Oral QAC breakfast  . pregabalin  75 mg Oral BID  . tamsulosin  0.4 mg Oral QHS    Continuous Infusions: . piperacillin-tazobactam (ZOSYN)  IV 3.375 g (11/08/17 1443)  . vancomycin 750 mg (11/08/17 1119)     LOS: 3 days     Alma Friendly, MD [facility]  If 7PM-7AM, please contact night-coverage [email or web address] Password [facility] 11/08/2017, 4:05 PM

## 2017-11-08 NOTE — Care Management Important Message (Signed)
Important Message  Patient Details  Name: TORRY ISTRE MRN: [id number] Date of Birth: 07-Jun-1930   Medicare Important Message Given:  Yes    Carolyn Maniscalco P Ibn Stief 11/08/2017, 4:10 PM

## 2017-11-08 NOTE — Evaluation (Signed)
Clinical/Bedside Swallow Evaluation Patient Details  Name: Derek Hays MRN: [id number] Date of Birth: [birth year]  Today's Date: 11/08/2017 Time: SLP Start Time (ACUTE ONLY): 1315 SLP Stop Time (ACUTE ONLY): 1340 SLP Time Calculation (min) (ACUTE ONLY): 25 min  Past Medical History:  Past Medical History:  Diagnosis Date  . Atrial fibrillation ([place])   . Bladder cancer ([place]) 1984  . Hypertension   . Stroke [facility])    Past Surgical History:  Past Surgical History:  Procedure Laterality Date  . IR CT HEAD LTD  05/27/2017  . IR PERCUTANEOUS ART THROMBECTOMY/INFUSION INTRACRANIAL INC DIAG ANGIO  05/27/2017  . PACEMAKER INSERTION    . RADIOLOGY WITH ANESTHESIA N/A 05/27/2017   Procedure: RADIOLOGY WITH ANESTHESIA;  Surgeon: Luanne Bras, MD;  Location: [facility];  Service: Radiology;  Laterality: N/A;   HPI:  Derek Hays a 84 y.o.malewithhistory ofCVA, afib, HTN,recently admitted in February with left-sided hemiplegia was noticed by patient's home health aide that patient was more weaker than usual on the left side. As per the patient he did not find any difference.Patient also was found to have fever at home of temperature 100 F, denies any other symptoms.In the ER patient was hypotensive,febrile with elevated lactate. Chest x-ray showed some mild pleural effusion,UA is unremarkable. ER physician discussed with on-call neurologist Dr. Leonel Ramsay who advised no stroke work-up and symptoms may be related to patient's possible developing sepsis and hypotension. Patient blood pressure improved with fluids. Was started on empiric antibiotics after blood cultures obtained for possible developing sepsis. Previous to admission, pt consuming regular diet with thin liquids and no overt s/s of aspiration on recent admission's MBS.    Assessment / Plan / Recommendation Clinical Impression  Pt is well known to this Probation officer. He immediately recognized. Pt presents with functional  oropharyngeal abilities as evidenced by consuming regular lunch tray with no increased left buccal residue and complete oral clearing. Additionally, he consumed 12 oz thin water via straw with no overt s/s of aspiration and decline in vitals. Would reocmmend return to thin liquids (as previously established on last admission with instrumental study). Education provided to nursing on recommendation and no foolw-up ST services are indicated at this time.  SLP Visit Diagnosis: Dysphagia, oropharyngeal phase (R13.12)    Aspiration Risk  Mild aspiration risk    Diet Recommendation Regular;Thin liquid   Liquid Administration via: Cup;Straw Medication Administration: Whole meds with liquid Supervision: Patient able to self feed Compensations: Minimize environmental distractions;Slow rate;Small sips/bites Postural Changes: Seated upright at 90 degrees;Remain upright for at least 30 minutes after po intake    Other  Recommendations Oral Care Recommendations: Oral care BID   Follow up Recommendations None(for ST services per pt he might go to SNF d/t wife's health)      Frequency and Duration            Prognosis        Swallow Study   General Date of Onset: 11/05/17 HPI: Derek Hays a 84 y.o.malewithhistory ofCVA, afib, HTN,recently admitted in February with left-sided hemiplegia was noticed by patient's home health aide that patient was more weaker than usual on the left side. As per the patient he did not find any difference.Patient also was found to have fever at home of temperature 100 F, denies any other symptoms.In the ER patient was hypotensive,febrile with elevated lactate. Chest x-ray showed some mild pleural effusion,UA is unremarkable. ER physician discussed with on-call neurologist Dr. Leonel Ramsay who advised no stroke work-up and  symptoms may be related to patient's possible developing sepsis and hypotension. Patient blood pressure improved with fluids. Was  started on empiric antibiotics after blood cultures obtained for possible developing sepsis. Previous to admission, pt consuming regular diet with thin liquids and no overt s/s of aspiration on recent admission's MBS.  Type of Study: Bedside Swallow Evaluation Previous Swallow Assessment: MBS 09/24/17- recommending regular with thin liquids Diet Prior to this Study: Regular;Nectar-thick liquids Temperature Spikes Noted: No Respiratory Status: Room air History of Recent Intubation: No Behavior/Cognition: Alert;Cooperative;Pleasant mood Oral Cavity Assessment: Within Functional Limits Oral Care Completed by SLP: No Oral Cavity - Dentition: Adequate natural dentition Vision: Functional for self-feeding Self-Feeding Abilities: Able to feed self Patient Positioning: Upright in bed Baseline Vocal Quality: Normal Volitional Cough: Strong Volitional Swallow: Able to elicit    Oral/Motor/Sensory Function Overall Oral Motor/Sensory Function: Mild impairment(at baseline from prior CVA) Facial ROM: Reduced left   Ice Chips Ice chips: Not tested   Thin Liquid Thin Liquid: Within functional limits Presentation: Straw    Nectar Thick Nectar Thick Liquid: Not tested   Honey Thick Honey Thick Liquid: Not tested   Puree Puree: Not tested   Solid     Solid: Within functional limits Presentation: Self Fed;Spoon      [name] 11/08/2017,3:17 PM

## 2017-11-09 LAB — CBC WITH DIFFERENTIAL/PLATELET
Abs Immature Granulocytes: 0 10*3/uL (ref 0.0–0.1)
BASOS PCT: 1 %
Basophils Absolute: 0.1 10*3/uL (ref 0.0–0.1)
EOS ABS: 0.8 10*3/uL — AB (ref 0.0–0.7)
Eosinophils Relative: 11 %
HCT: 37.6 % — ABNORMAL LOW (ref 39.0–52.0)
Hemoglobin: 12.2 g/dL — ABNORMAL LOW (ref 13.0–17.0)
Immature Granulocytes: 0 %
Lymphocytes Relative: 20 %
Lymphs Abs: 1.3 10*3/uL (ref 0.7–4.0)
MCH: 28.2 pg (ref 26.0–34.0)
MCHC: 32.4 g/dL (ref 30.0–36.0)
MCV: 87 fL (ref 78.0–100.0)
MONO ABS: 0.6 10*3/uL (ref 0.1–1.0)
MONOS PCT: 9 %
Neutro Abs: 3.9 10*3/uL (ref 1.7–7.7)
Neutrophils Relative %: 59 %
PLATELETS: 169 10*3/uL (ref 150–400)
RBC: 4.32 MIL/uL (ref 4.22–5.81)
RDW: 15.7 % — ABNORMAL HIGH (ref 11.5–15.5)
WBC: 6.7 10*3/uL (ref 4.0–10.5)

## 2017-11-09 LAB — BRAIN NATRIURETIC PEPTIDE: B Natriuretic Peptide: 424.5 pg/mL — ABNORMAL HIGH (ref 0.0–100.0)

## 2017-11-09 LAB — BASIC METABOLIC PANEL
Anion gap: 11 (ref 5–15)
BUN: 16 mg/dL (ref 8–23)
CO2: 24 mmol/L (ref 22–32)
Calcium: 9 mg/dL (ref 8.9–10.3)
Chloride: 106 mmol/L (ref 98–111)
Creatinine, Ser: 1.31 mg/dL — ABNORMAL HIGH (ref 0.61–1.24)
GFR calc Af Amer: 56 mL/min — ABNORMAL LOW (ref >60)
GFR, EST NON AFRICAN AMERICAN: 48 mL/min — AB (ref >60)
GLUCOSE: 82 mg/dL (ref 70–99)
POTASSIUM: 3.6 mmol/L (ref 3.5–5.1)
Sodium: 141 mmol/L (ref 135–145)

## 2017-11-09 NOTE — Progress Notes (Signed)
Physical Therapy Treatment Patient Details Name: Derek Hays MRN: [id number] DOB: 1930-08-07 Today's Date: 11/09/2017    History of Present Illness Derek Hays is a 84 y.o. male with history of CVA, afib, HTN, recently admitted in February with left-sided hemiplegia was noticed by patient's home health aide that patient was more weaker than usual on the left side.  As per the patient he did not find any difference. Patient also was found to have fever at home of temperature 100 F, denies any other symptoms. In the ER patient was hypotensive, febrile with elevated lactate.  Chest x-ray showed some mild pleural effusion, UA is unremarkable. ER physician discussed with on-call neurologist Dr. Leonel Ramsay who advised no stroke work-up and symptoms may be related to patient's possible developing sepsis and hypotension.     PT Comments    Session focused on bed mobility and sitting balance/therex. Discussed with patient level of care required, pt informs therapy that his wife has recently suffered ankle fracture and can hardly walk herself now, he feels strongly that she will not be able to support him at home. Updating recs to SNF as patient requires 24/7 assistance.     Follow Up Recommendations  SNF;Supervision/Assistance - 24 hour     Equipment Recommendations  None recommended by PT    Recommendations for Other Services       Precautions / Restrictions Precautions Precautions: Fall Restrictions Weight Bearing Restrictions: No    Mobility  Bed Mobility Overal bed mobility: Needs Assistance Bed Mobility: Supine to Sit     Supine to sit: Max assist;+2 for physical assistance;HOB elevated     General bed mobility comments: Pt needed max assist for LEs off bed and to elevate trunk.  increased time for pericare with nursing staff. pt tolerating rolling with mod A  Transfers                 General transfer comment: unable to stand as pt did not want to attempt  today  Ambulation/Gait                 Stairs             Wheelchair Mobility    Modified Rankin (Stroke Patients Only)       Balance Overall balance assessment: Needs assistance Sitting-balance support: Single extremity supported;Feet supported Sitting balance-Leahy Scale: Fair Sitting balance - Comments: Pt could sit without UE support but could not withstand any challenges to balance without needing mod assist.  When pt performing exercises, pt leaning posteriorly and needing steadying support.                                     Cognition Arousal/Alertness: Awake/alert   Overall Cognitive Status: Within Functional Limits for tasks assessed                                        Exercises General Exercises - Lower Extremity Ankle Circles/Pumps: AAROM;Both;5 reps;Seated Long Arc Quad: AAROM;Both;10 reps;Seated Hip Flexion/Marching: Right;AROM;5 reps;Seated    General Comments        Pertinent Vitals/Pain Pain Assessment: No/denies pain    Home Living                      Prior Function  PT Goals (current goals can now be found in the care plan section) Acute Rehab PT Goals Patient Stated Goal: go to SNF for therapy PT Goal Formulation: With patient Time For Goal Achievement: 11/21/17 Potential to Achieve Goals: Good Progress towards PT goals: Progressing toward goals    Frequency    Min 3X/week      PT Plan Discharge plan needs to be updated    Co-evaluation              AM-PAC PT "6 Clicks" Daily Activity  Outcome Measure  Difficulty turning over in bed (including adjusting bedclothes, sheets and blankets)?: Unable Difficulty moving from lying on back to sitting on the side of the bed? : Unable Difficulty sitting down on and standing up from a chair with arms (e.g., wheelchair, bedside commode, etc,.)?: Unable Help needed moving to and from a bed to chair (including a  wheelchair)?: Total Help needed walking in hospital room?: Total Help needed climbing 3-5 steps with a railing? : Total 6 Click Score: 6    End of Session   Activity Tolerance: Patient limited by fatigue Patient left: in bed;with call bell/phone within reach;with bed alarm set Nurse Communication: Mobility status;Need for lift equipment PT Visit Diagnosis: Unsteadiness on feet (R26.81);Muscle weakness (generalized) (M62.81);Hemiplegia and hemiparesis Hemiplegia - Right/Left: Left Hemiplegia - dominant/non-dominant: Non-dominant Hemiplegia - caused by: Cerebral infarction     Time: 1230-1300(increased time for pericare) PT Time Calculation (min) (ACUTE ONLY): 30 min  Charges:  $Therapeutic Activity: 8-22 mins                     Reinaldo Berber, PT, DPT Acute Rehab Services Pager: [phone]     Reinaldo Berber 11/09/2017, 1:12 PM

## 2017-11-09 NOTE — Progress Notes (Signed)
PROGRESS NOTE  PRINCETON NABOR INO:[id number] DOB: [birth year] DOA: 11/05/2017 [facility]  HPI/Recap of past 24 hours: Derek Hays is a 84 y.o. male with history of CVA, afib, HTN, recently admitted in February with left-sided hemiplegia was noticed by patient's home health aide that patient was more weaker than usual on the left side.  As per the patient he did not find any difference. Patient also was found to have fever at home of temperature 100 F, denies any other symptoms. In the ER patient was hypotensive, febrile with elevated lactate.  Chest x-ray showed some mild pleural effusion, UA is unremarkable. ER physician discussed with on-call neurologist Dr. Leonel Ramsay who advised no stroke work-up and symptoms may be related to patient's possible developing sepsis and hypotension. Patient blood pressure improved with fluids.  Was started on empiric antibiotics after blood cultures obtained for possible developing sepsis.  Today, pt denies any new complaints.     Assessment/Plan: Principal Problem:   SIRS (systemic inflammatory response syndrome) (HCC) Active Problems:   Right middle cerebral artery stroke (HCC)   Stage 3 chronic kidney disease (HCC)   PAF (paroxysmal atrial fibrillation) (HCC)   Permanent atrial fibrillation (HCC)  Sepsis likely due to HCAP Improving Febrile, hypotensive, lactic acidosis, on admission Afebrile, with resolved leukocytosis Lactic acidosis resolved Procalcitonin elevated, slowly down trending UA negative, UC no growth BC X 2 NGTD CXR small pleural effusion, no consolidation noted, repeat suggestive of pulmonary congestion Vs worsening bibasilar opacities, atelectasis, Vs infiltrates Continue IV Cefepime  Acute metabolic encephalopathy Resolved Likely due to above CT head showed no evidence of acute intracranial abnormality, chronic ischemia including a large R MCA infarct  ?Acute on chronic diastolic HF BNP 096 CXR  with worsening congestion ECHO 05/2017 showed: EF of 60-65%, Grade 2 DD Start IV lasix 20 mg daily due to soft BP Monitor renal fxn  Hx of CVA with residual left sided weakness CT as above Monitor closely  AKI Resolving  Paroxysmal Afib Rate controlled Continue eliquis  HTN Stable  Hypothyroidism Continue synthroid  HLD Continue lipitor     Code Status: Full  Family Communication: None at bedside  Disposition Plan: SNF   Consultants:  None   Procedures:  None  Antimicrobials: Vancomycin Cefepime  DVT prophylaxis: Eliquis   Objective: Vitals:   11/09/17 0050 11/09/17 0540 11/09/17 0739 11/09/17 1201  BP: 129/83 (!) 138/97 135/75 122/83  Pulse: (!) 58 (!) 56 (!) 59 65  Resp: 16 20 14 18   Temp: 97.6 F (36.4 C) 98 F (36.7 C) 98 F (36.7 C) 97.9 F (36.6 C)  TempSrc: Oral Oral Oral Oral  SpO2: 99% 99% 98% 98%  Weight:  91.7 kg (202 lb 2.6 oz)    Height:        Intake/Output Summary (Last 24 hours) at 11/09/2017 1623 Last data filed at 11/09/2017 1150 Gross per 24 hour  Intake 860 ml  Output 2701 ml  Net -1841 ml   Filed Weights   11/07/17 0440 11/08/17 0121 11/09/17 0540  Weight: 94.4 kg (208 lb 1.8 oz) 95 kg (209 lb 7 oz) 91.7 kg (202 lb 2.6 oz)    Exam:   General: NAD, alert,oriented  Cardiovascular: S1, S2 present   Respiratory: Diminished BS   Abdomen: Soft, NT, ND, BS present  Musculoskeletal: 2+ LLE edema, trace RLE edema  Skin: Normal  Psychiatry: Normal mood   Data Reviewed: CBC: Recent Labs  Lab 11/05/17 1945 11/06/17 0509 11/07/17 0337 11/08/17  3710 11/09/17 0313  WBC 13.5* 7.8 6.3 5.7 6.7  NEUTROABS 12.2* 6.0 4.3 3.4 3.9  HGB 12.7* 11.3* 10.7* 11.1* 12.2*  HCT 39.3 34.8* 33.1* 34.1* 37.6*  MCV 89.3 89.2 89.5 87.7 87.0  PLT 188 131* 131* 145* 626   Basic Metabolic Panel: Recent Labs  Lab 11/05/17 1945 11/06/17 0509 11/07/17 0337 11/08/17 0317 11/09/17 0313  NA 140 141 139 139 141  K 4.8 3.2*  3.7 3.8 3.6  CL 104 112* 110 107 106  CO2 23 23 23 24 24   GLUCOSE 131* 98 85 82 82  BUN 25* 21 23 21 16   CREATININE 1.45* 1.16 1.24 1.23 1.31*  CALCIUM 8.8* 7.2* 8.0* 8.6* 9.0  MG  --   --  1.8  --   --    GFR: Estimated Creatinine Clearance: 46.1 mL/min (A) (by C-G formula based on SCr of 1.31 mg/dL (H)). Liver Function Tests: Recent Labs  Lab 11/05/17 1945 11/06/17 0509  AST 19 14*  ALT 9 9  ALKPHOS 67 51  BILITOT 1.4* 0.9  PROT 6.0* 4.4*  ALBUMIN 2.9* 2.2*   No results for input(s): LIPASE, AMYLASE in the last 168 hours. No results for input(s): AMMONIA in the last 168 hours. Coagulation Profile: Recent Labs  Lab 11/05/17 1945 11/06/17 0509  INR 1.80 1.81   Cardiac Enzymes: No results for input(s): CKTOTAL, CKMB, CKMBINDEX, TROPONINI in the last 168 hours. BNP (last 3 results) No results for input(s): PROBNP in the last 8760 hours. HbA1C: No results for input(s): HGBA1C in the last 72 hours. CBG: Recent Labs  Lab 11/05/17 2243  GLUCAP 114*   Lipid Profile: No results for input(s): CHOL, HDL, LDLCALC, TRIG, CHOLHDL, LDLDIRECT in the last 72 hours. Thyroid Function Tests: No results for input(s): TSH, T4TOTAL, FREET4, T3FREE, THYROIDAB in the last 72 hours. Anemia Panel: No results for input(s): VITAMINB12, FOLATE, FERRITIN, TIBC, IRON, RETICCTPCT in the last 72 hours. Urine analysis:    Component Value Date/Time   COLORURINE YELLOW 11/05/2017 2112   APPEARANCEUR HAZY (A) 11/05/2017 2112   LABSPEC 1.016 11/05/2017 2112   PHURINE 5.0 11/05/2017 2112   GLUCOSEU NEGATIVE 11/05/2017 2112   HGBUR NEGATIVE 11/05/2017 2112   BILIRUBINUR NEGATIVE 11/05/2017 2112   KETONESUR NEGATIVE 11/05/2017 2112   PROTEINUR NEGATIVE 11/05/2017 2112   NITRITE NEGATIVE 11/05/2017 2112   LEUKOCYTESUR NEGATIVE 11/05/2017 2112   Sepsis Labs: @LABRCNTIP (procalcitonin:4,lacticidven:4)  ) Recent Results (from the past 240 hour(s))  Blood Culture (routine x 2)     Status: None  (Preliminary result)   Collection Time: 11/05/17  7:45 PM  Result Value Ref Range Status   Specimen Description BLOOD LEFT HAND  Final   Special Requests   Final    BOTTLES DRAWN AEROBIC AND ANAEROBIC Blood Culture adequate volume   Culture   Final    NO GROWTH 4 DAYS Performed at [facility], [place] [address], [place], [place] [address]    Report Status PENDING  Incomplete  Blood Culture (routine x 2)     Status: None (Preliminary result)   Collection Time: 11/05/17  8:05 PM  Result Value Ref Range Status   Specimen Description BLOOD RIGHT FOREARM  Final   Special Requests   Final    BOTTLES DRAWN AEROBIC AND ANAEROBIC Blood Culture results may not be optimal due to an inadequate volume of blood received in culture bottles   Culture   Final    NO GROWTH 4 DAYS Performed at [facility]  Lab, [address] [place], [place] [address]    Report Status PENDING  Incomplete  Urine culture     Status: None   Collection Time: 11/05/17  9:12 PM  Result Value Ref Range Status   Specimen Description URINE, CATHETERIZED  Final   Special Requests NONE  Final   Culture   Final    NO GROWTH Performed at [facility], [address], [place], [place] [address]    Report Status 11/07/2017 FINAL  Final  MRSA PCR Screening     Status: None   Collection Time: 11/08/17  4:05 PM  Result Value Ref Range Status   MRSA by PCR NEGATIVE NEGATIVE Final    Comment:        The GeneXpert MRSA Assay (FDA approved for NASAL specimens only), is one component of a comprehensive MRSA colonization surveillance program. It is not intended to diagnose MRSA infection nor to guide or monitor treatment for MRSA infections. Performed at [facility], [place] [address] [place], [place] [address]       Studies: No results found.  Scheduled Meds: . apixaban  5 mg Oral BID  . atorvastatin  40 mg Oral q1800  . bethanechol  25 mg Oral TID  . furosemide  20 mg Intravenous Daily  .  latanoprost  1 drop Both Eyes QHS  . levothyroxine  125 mcg Oral QAC breakfast  . pregabalin  75 mg Oral BID  . tamsulosin  0.4 mg Oral QHS    Continuous Infusions: . ceFEPime (MAXIPIME) IV 2 g (11/09/17 0630)     LOS: 4 days     Alma Friendly, MD [facility]  If 7PM-7AM, please contact night-coverage [email or web address] Password [facility] 11/09/2017, 4:23 PM

## 2017-11-10 DIAGNOSIS — J189 Pneumonia, unspecified organism: Secondary | ICD-10-CM

## 2017-11-10 DIAGNOSIS — I63511 Cerebral infarction due to unspecified occlusion or stenosis of right middle cerebral artery: Secondary | ICD-10-CM

## 2017-11-10 DIAGNOSIS — A419 Sepsis, unspecified organism: Principal | ICD-10-CM

## 2017-11-10 LAB — CBC WITH DIFFERENTIAL/PLATELET
ABS IMMATURE GRANULOCYTES: 0 10*3/uL (ref 0.0–0.1)
BASOS PCT: 1 %
Basophils Absolute: 0.1 10*3/uL (ref 0.0–0.1)
Eosinophils Absolute: 0.8 10*3/uL — ABNORMAL HIGH (ref 0.0–0.7)
Eosinophils Relative: 12 %
HCT: 35.7 % — ABNORMAL LOW (ref 39.0–52.0)
HEMOGLOBIN: 11.6 g/dL — AB (ref 13.0–17.0)
IMMATURE GRANULOCYTES: 0 %
LYMPHS PCT: 19 %
Lymphs Abs: 1.4 10*3/uL (ref 0.7–4.0)
MCH: 28.5 pg (ref 26.0–34.0)
MCHC: 32.5 g/dL (ref 30.0–36.0)
MCV: 87.7 fL (ref 78.0–100.0)
MONOS PCT: 9 %
Monocytes Absolute: 0.7 10*3/uL (ref 0.1–1.0)
NEUTROS ABS: 4.1 10*3/uL (ref 1.7–7.7)
NEUTROS PCT: 59 %
Platelets: 161 10*3/uL (ref 150–400)
RBC: 4.07 MIL/uL — ABNORMAL LOW (ref 4.22–5.81)
RDW: 15.4 % (ref 11.5–15.5)
WBC: 7.1 10*3/uL (ref 4.0–10.5)

## 2017-11-10 LAB — CULTURE, BLOOD (ROUTINE X 2)
CULTURE: NO GROWTH
Culture: NO GROWTH
Special Requests: ADEQUATE

## 2017-11-10 LAB — BASIC METABOLIC PANEL
Anion gap: 7 (ref 5–15)
BUN: 13 mg/dL (ref 8–23)
CHLORIDE: 106 mmol/L (ref 98–111)
CO2: 28 mmol/L (ref 22–32)
Calcium: 8.8 mg/dL — ABNORMAL LOW (ref 8.9–10.3)
Creatinine, Ser: 1.28 mg/dL — ABNORMAL HIGH (ref 0.61–1.24)
GFR calc non Af Amer: 50 mL/min — ABNORMAL LOW (ref >60)
GFR, EST AFRICAN AMERICAN: 58 mL/min — AB (ref >60)
Glucose, Bld: 88 mg/dL (ref 70–99)
Potassium: 3 mmol/L — ABNORMAL LOW (ref 3.5–5.1)
SODIUM: 141 mmol/L (ref 135–145)

## 2017-11-10 MED ORDER — POTASSIUM CHLORIDE CRYS ER 20 MEQ PO TBCR
40.0000 meq | EXTENDED_RELEASE_TABLET | Freq: Two times a day (BID) | ORAL | Status: AC
  Administered 2017-11-10 (×2): 40 meq via ORAL
  Filled 2017-11-10 (×2): qty 2

## 2017-11-10 NOTE — Progress Notes (Signed)
PROGRESS NOTE  Derek Hays HQI:[id number] DOB: [birth year] DOA: 11/05/2017 [facility]: [facility]  HPI/Recap of past 24 hours: Derek Hays is a 84 y.o. male with history of CVA, afib, HTN, recently admitted in February with left-sided hemiplegia was noticed by patient's home health aide that patient was more weaker than usual on the left side.  As per the patient he did not find any difference. Patient also was found to have fever at home of temperature 100 F, denies any other symptoms. In the ER patient was hypotensive, febrile with elevated lactate.  Chest x-ray showed some mild pleural effusion, UA is unremarkable. ER physician discussed with on-call neurologist Dr. Leonel Ramsay who advised no stroke work-up and symptoms may be related to patient's possible developing sepsis and hypotension. Patient blood pressure improved with fluids.  Was started on empiric antibiotics after blood cultures obtained for possible developing sepsis.  Today, pt denies any new complaints.      Assessment/Plan: Principal Problem:   SIRS (systemic inflammatory response syndrome) (HCC) Active Problems:   Right middle cerebral artery stroke (HCC)   Stage 3 chronic kidney disease (HCC)   PAF (paroxysmal atrial fibrillation) (HCC)   Permanent atrial fibrillation (HCC)  Sepsis likely due to HCAP Improving Febrile, hypotensive, lactic acidosis, on admission Afebrile, with resolved leukocytosis Lactic acidosis resolved Procalcitonin elevated, slowly down trending UA negative, UC no growth BC X 2 NGTD CXR small pleural effusion, no consolidation noted, repeat suggestive of pulmonary congestion Vs worsening bibasilar opacities, atelectasis, Vs infiltrates Continue IV Cefepime  Acute metabolic encephalopathy Resolved Likely due to above CT head showed no evidence of acute intracranial abnormality, chronic ischemia including a large R MCA infarct  ?Acute on chronic diastolic HF BNP 132 CXR  with worsening congestion ECHO 05/2017 showed: EF of 60-65%, Grade 2 DD Continue IV lasix 20 mg daily due to soft BP Monitor renal fxn  Hx of CVA with residual left sided weakness CT as above Monitor closely  AKI Resolving  Paroxysmal Afib Rate controlled Continue eliquis  HTN Stable  Hypothyroidism Continue synthroid  HLD Continue lipitor     Code Status: Full  Family Communication: None at bedside  Disposition Plan: SNF   Consultants:  None   Procedures:  None  Antimicrobials: Cefepime  DVT prophylaxis: Eliquis   Objective: Vitals:   11/09/17 1600 11/09/17 1944 11/10/17 0028 11/10/17 0418  BP: 128/81 (!) 99/54 111/65 (!) 143/75  Pulse: (!) 59 78 60 (!) 59  Resp: 16 18 16 16   Temp: 98 F (36.7 C) 97.7 F (36.5 C) 97.9 F (36.6 C) 97.8 F (36.6 C)  TempSrc: Oral Oral Axillary Axillary  SpO2: 100% 98% 96% 100%  Weight:    87 kg (191 lb 12.8 oz)  Height:        Intake/Output Summary (Last 24 hours) at 11/10/2017 1317 Last data filed at 11/10/2017 1231 Gross per 24 hour  Intake 940 ml  Output 2025 ml  Net -1085 ml   Filed Weights   11/08/17 0121 11/09/17 0540 11/10/17 0418  Weight: 95 kg (209 lb 7 oz) 91.7 kg (202 lb 2.6 oz) 87 kg (191 lb 12.8 oz)    Exam:   General: NAD, alert,oriented  Cardiovascular: S1, S2 present   Respiratory: Diminished BS   Abdomen: Soft, NT, ND, BS present  Musculoskeletal: 1+ LLE edema, trace RLE edema  Skin: Normal  Psychiatry: Normal mood   Data Reviewed: CBC: Recent Labs  Lab 11/06/17 0509 11/07/17 0337 11/08/17 0317 11/09/17  7846 11/10/17 0508  WBC 7.8 6.3 5.7 6.7 7.1  NEUTROABS 6.0 4.3 3.4 3.9 4.1  HGB 11.3* 10.7* 11.1* 12.2* 11.6*  HCT 34.8* 33.1* 34.1* 37.6* 35.7*  MCV 89.2 89.5 87.7 87.0 87.7  PLT 131* 131* 145* 169 962   Basic Metabolic Panel: Recent Labs  Lab 11/06/17 0509 11/07/17 0337 11/08/17 0317 11/09/17 0313 11/10/17 0508  NA 141 139 139 141 141  K 3.2* 3.7  3.8 3.6 3.0*  CL 112* 110 107 106 106  CO2 23 23 24 24 28   GLUCOSE 98 85 82 82 88  BUN 21 23 21 16 13   CREATININE 1.16 1.24 1.23 1.31* 1.28*  CALCIUM 7.2* 8.0* 8.6* 9.0 8.8*  MG  --  1.8  --   --   --    GFR: Estimated Creatinine Clearance: 47.2 mL/min (A) (by C-G formula based on SCr of 1.28 mg/dL (H)). Liver Function Tests: Recent Labs  Lab 11/05/17 1945 11/06/17 0509  AST 19 14*  ALT 9 9  ALKPHOS 67 51  BILITOT 1.4* 0.9  PROT 6.0* 4.4*  ALBUMIN 2.9* 2.2*   No results for input(s): LIPASE, AMYLASE in the last 168 hours. No results for input(s): AMMONIA in the last 168 hours. Coagulation Profile: Recent Labs  Lab 11/05/17 1945 11/06/17 0509  INR 1.80 1.81   Cardiac Enzymes: No results for input(s): CKTOTAL, CKMB, CKMBINDEX, TROPONINI in the last 168 hours. BNP (last 3 results) No results for input(s): PROBNP in the last 8760 hours. HbA1C: No results for input(s): HGBA1C in the last 72 hours. CBG: Recent Labs  Lab 11/05/17 2243  GLUCAP 114*   Lipid Profile: No results for input(s): CHOL, HDL, LDLCALC, TRIG, CHOLHDL, LDLDIRECT in the last 72 hours. Thyroid Function Tests: No results for input(s): TSH, T4TOTAL, FREET4, T3FREE, THYROIDAB in the last 72 hours. Anemia Panel: No results for input(s): VITAMINB12, FOLATE, FERRITIN, TIBC, IRON, RETICCTPCT in the last 72 hours. Urine analysis:    Component Value Date/Time   COLORURINE YELLOW 11/05/2017 2112   APPEARANCEUR HAZY (A) 11/05/2017 2112   LABSPEC 1.016 11/05/2017 2112   PHURINE 5.0 11/05/2017 2112   GLUCOSEU NEGATIVE 11/05/2017 2112   HGBUR NEGATIVE 11/05/2017 2112   BILIRUBINUR NEGATIVE 11/05/2017 2112   KETONESUR NEGATIVE 11/05/2017 2112   PROTEINUR NEGATIVE 11/05/2017 2112   NITRITE NEGATIVE 11/05/2017 2112   LEUKOCYTESUR NEGATIVE 11/05/2017 2112   Sepsis Labs: @LABRCNTIP (procalcitonin:4,lacticidven:4)  ) Recent Results (from the past 240 hour(s))  Blood Culture (routine x 2)     Status: None  (Preliminary result)   Collection Time: 11/05/17  7:45 PM  Result Value Ref Range Status   Specimen Description BLOOD LEFT HAND  Final   Special Requests   Final    BOTTLES DRAWN AEROBIC AND ANAEROBIC Blood Culture adequate volume   Culture   Final    NO GROWTH 4 DAYS Performed at [facility], [place] [address], [place], [place] [address]    Report Status PENDING  Incomplete  Blood Culture (routine x 2)     Status: None (Preliminary result)   Collection Time: 11/05/17  8:05 PM  Result Value Ref Range Status   Specimen Description BLOOD RIGHT FOREARM  Final   Special Requests   Final    BOTTLES DRAWN AEROBIC AND ANAEROBIC Blood Culture results may not be optimal due to an inadequate volume of blood received in culture bottles   Culture   Final    NO GROWTH 4 DAYS Performed at [facility]  Lab, [address] [place], [place] [address]    Report Status PENDING  Incomplete  Urine culture     Status: None   Collection Time: 11/05/17  9:12 PM  Result Value Ref Range Status   Specimen Description URINE, CATHETERIZED  Final   Special Requests NONE  Final   Culture   Final    NO GROWTH Performed at [facility], [address] [place], [place] [address]    Report Status 11/07/2017 FINAL  Final  MRSA PCR Screening     Status: None   Collection Time: 11/08/17  4:05 PM  Result Value Ref Range Status   MRSA by PCR NEGATIVE NEGATIVE Final    Comment:        The GeneXpert MRSA Assay (FDA approved for NASAL specimens only), is one component of a comprehensive MRSA colonization surveillance program. It is not intended to diagnose MRSA infection nor to guide or monitor treatment for MRSA infections. Performed at [facility], [place] [address] [place], [place] [address]       Studies: No results found.  Scheduled Meds: . apixaban  5 mg Oral BID  . atorvastatin  40 mg Oral q1800  . bethanechol  25 mg Oral TID  . furosemide  20 mg Intravenous Daily  .  latanoprost  1 drop Both Eyes QHS  . levothyroxine  125 mcg Oral QAC breakfast  . potassium chloride  40 mEq Oral BID  . pregabalin  75 mg Oral BID  . tamsulosin  0.4 mg Oral QHS    Continuous Infusions: . ceFEPime (MAXIPIME) IV 2 g (11/10/17 0701)     LOS: 5 days     Alma Friendly, MD [facility]  If 7PM-7AM, please contact night-coverage [email or web address] Password [email or web address] 11/10/2017, 1:17 PM

## 2017-11-11 LAB — BASIC METABOLIC PANEL
ANION GAP: 9 (ref 5–15)
BUN: 18 mg/dL (ref 8–23)
CO2: 27 mmol/L (ref 22–32)
Calcium: 9 mg/dL (ref 8.9–10.3)
Chloride: 106 mmol/L (ref 98–111)
Creatinine, Ser: 1.42 mg/dL — ABNORMAL HIGH (ref 0.61–1.24)
GFR calc Af Amer: 51 mL/min — ABNORMAL LOW (ref >60)
GFR calc non Af Amer: 44 mL/min — ABNORMAL LOW (ref >60)
GLUCOSE: 88 mg/dL (ref 70–99)
POTASSIUM: 3.7 mmol/L (ref 3.5–5.1)
Sodium: 142 mmol/L (ref 135–145)

## 2017-11-11 LAB — C DIFFICILE QUICK SCREEN W PCR REFLEX
C DIFFICILE (CDIFF) INTERP: NOT DETECTED
C DIFFICILE (CDIFF) TOXIN: NEGATIVE
C Diff antigen: NEGATIVE

## 2017-11-11 MED ORDER — CEFEPIME HCL 2 G IJ SOLR: 2.0000 g | INTRAMUSCULAR | Status: DC

## 2017-11-11 NOTE — Clinical Social Work Note (Signed)
Clinical Social Work Assessment  Patient Details  Name: Derek Hays MRN: [id number] Date of Birth: 1930/09/09  Date of referral:  11/11/17               Reason for consult:  Discharge Planning, Facility Placement                Permission sought to share information with:  Family Supports Permission granted to share information::  Yes, Verbal Permission Granted  Name::     Javoni Lucken  Agency::  snf  Relationship::  spouse  Contact Information:  [phone]  Housing/Transportation Living arrangements for the past 2 months:  [place] of Information:  Patient Patient Interpreter Needed:  None Criminal Activity/Legal Involvement Pertinent to Current Situation/Hospitalization:  No - Comment as needed Significant Relationships:  Adult Children, Spouse Lives with:  Self, Spouse Do you feel safe going back to the place where you live?  Yes Need for family participation in patient care:  Yes (Comment)  Care giving concerns:  No family at bedside. Patient stated he lives at home with spouse. Patent stated that spouse support him as much as she can but stated he has a hard time taking care of herself let alone him. Patient stated he has been at a facility in the past and was recently admitted to one. Patient stated he was at a facility from March to June of 2019  Social Worker assessment / plan:  CSW met patient at bedside to discuss [place]. Patient stated he is agreeable to discharge to a rehab facility as long as insurance is able to cover it. CSW spoke with patients wife via phone with verbal permission from patient. Spouse stated she would like patient to go back to a facility since she does not have a lot of family support in the area. Spouse stated that their oldest child lives in [place] and the youngest lives 3 hours away. CSW to fax patient out to surrounding facilities and follow up with family once bed offers are available    Employment status:   Retired Insurance underwriter information:  Medicare PT Recommendations:  [place] / Referral to community resources:  [place]  Patient/Family's Response to care:  Patient appreciates CSW role in care.   Patient/Family's Understanding of and Emotional Response to Diagnosis, Current Treatment, and Prognosis:  Patient has clear understanding of diagnoses and the help that he will need to be able to get back to baseline   Emotional Assessment Appearance:  Appears stated age Attitude/Demeanor/Rapport:  Engaged Affect (typically observed):  Accepting, Pleasant Orientation:  Oriented to Self, Oriented to Place, Oriented to  Time Alcohol / Substance use:  Not Applicable Psych involvement (Current and /or in the community):  No (Comment)  Discharge Needs  Concerns to be addressed:  Care Coordination Readmission within the last 30 days:  No Current discharge risk:  Chronically ill, Dependent with Mobility Barriers to Discharge:  No SNF bed   Wende Neighbors, LCSW 11/11/2017, 1:05 PM

## 2017-11-11 NOTE — Care Management Note (Signed)
Case Management Note  Patient Details  Name: Derek Hays MRN: [id number] Date of Birth: 12/20/1930  Subjective/Objective:                    Action/Plan: Received phone call from [facility] that patient sees Dr. Knox Saliva as a home based [facility]. His social worker is Mickel Fuchs: [phone] ext [phone]. Currently the recommendation are for SNF. Per [facility] patient does not have any LT benefits through them but would have short term benefits if needed.  CM following for d/c disposition.  Expected Discharge Date:                  Expected Discharge Plan:  [place]  In-House Referral:     Discharge planning Services  CM Consult  Post Acute Care Choice:  Home Health, Resumption of Svcs/PTA Provider Choice offered to:  Patient, Spouse  DME Arranged:    DME Agency:     HH Arranged:    [place] Agency:  [facility] [employer]  Status of Service:  In process, will continue to follow  If discussed at Long Length of Stay Meetings, dates discussed:    Additional Comments:  Pollie Friar, RN 11/11/2017, 12:09 PM

## 2017-11-11 NOTE — Progress Notes (Signed)
Pharmacy Antibiotic Note  Derek Hays is a 84 y.o. male admitted on 11/05/2017 with pneumonia.  Pharmacy has been consulted for cefepime dosing - total abx day #6. All cultures negative. SCr trending up to 1.42, CrCl now <50.  Plan: Adjust cefepime to 2g IV q24h Monitor clinical progress, c/s, renal function F/u de-escalation plan/LOT  Height: 6' (182.9 cm) Weight: 190 lb 7.6 oz (86.4 kg) IBW/kg (Calculated) : 77.6  Temp (24hrs), Avg:98.1 F (36.7 C), Min:97.8 F (36.6 C), Max:98.3 F (36.8 C)  Recent Labs  Lab 11/05/17 2001 11/05/17 2013 11/05/17 2214 11/06/17 0213 11/06/17 0509 11/07/17 0337 11/08/17 0317 11/08/17 0953 11/09/17 0313 11/10/17 0508 11/11/17 0347  WBC  --   --   --   --  7.8 6.3 5.7  --  6.7 7.1  --   CREATININE  --   --   --   --  1.16 1.24 1.23  --  1.31* 1.28* 1.42*  LATICACIDVEN 3.03* 2.49* 2.97* 0.9 1.4  --   --   --   --   --   --   VANCOTROUGH  --   --   --   --   --   --   --  19  --   --   --     Estimated Creatinine Clearance: 42.5 mL/min (A) (by C-G formula based on SCr of 1.42 mg/dL (H)).    No Known Allergies  Elicia Lamp, PharmD, BCPS Clinical Pharmacist 11/11/2017 8:41 AM

## 2017-11-11 NOTE — Plan of Care (Signed)
  Problem: Education: Goal: Knowledge of General Education information will improve Description Including pain rating scale, medication(s)/side effects and non-pharmacologic comfort measures Outcome: Progressing   Problem: Health Behavior/Discharge Planning: Goal: Ability to manage health-related needs will improve Outcome: Progressing   

## 2017-11-11 NOTE — [place] FL2 (Signed)
[employer] MEDICAID FL2 LEVEL OF CARE SCREENING TOOL     IDENTIFICATION  Patient Name: Derek Hays Birthdate: 28-Apr-1930 Sex: male Admission Date (Current Location): 11/05/2017  [facility] and [place] Number:  Herbalist and Address:  [facility] [place], [place] [address] [place], [place] [address]      Provider Number: [id number]  Attending Physician Name and Address:  Alma Friendly, MD  Relative Name and Phone Number:  Jarrett Albor, [phone]    Current Level of Care: Hospital Recommended Level of Care: [place] Prior Approval Number:    Date Approved/Denied:   PASRR Number: [id number]  Discharge Plan: SNF    Current Diagnoses: Patient Active Problem List   Diagnosis Date Noted  . Permanent atrial fibrillation ([place])   . SIRS (systemic inflammatory response syndrome) ([place]) 11/05/2017  . Urinary tract infection 09/23/2017  . Acute respiratory failure with hypoxia ([place]) 09/23/2017  . Oropharyngeal dysphagia 09/23/2017  . Palliative care by specialist   . Sepsis due to urinary tract infection ([place])   . HCAP (healthcare-associated pneumonia)   . Altered mental status 08/10/2017  . Acute blood loss anemia   . Stage 3 chronic kidney disease ([place])   . PAF (paroxysmal atrial fibrillation) ([place])   . Benign essential HTN   . Dysphagia, post-stroke   . 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 Stroke (cerebrum) ([place]) 05/27/2017    Orientation RESPIRATION BLADDER Height & Weight     Self, Time, Situation, Place  Normal External catheter Weight: 190 lb 7.6 oz (86.4 kg) Height:  6' (182.9 cm)  BEHAVIORAL SYMPTOMS/MOOD NEUROLOGICAL BOWEL NUTRITION STATUS      Incontinent Diet(heart healthy)  AMBULATORY STATUS COMMUNICATION OF NEEDS Skin   Extensive Assist Verbally                         Personal Care Assistance Level of Assistance  Bathing,  Feeding, Dressing Bathing Assistance: Limited assistance Feeding assistance: Independent Dressing Assistance: Limited assistance     Functional Limitations Info  Sight, Hearing, Speech Sight Info: Adequate Hearing Info: Adequate Speech Info: Adequate    SPECIAL CARE FACTORS FREQUENCY  PT (By licensed PT), OT (By licensed OT)     PT Frequency: 5x wk OT Frequency: 5x wk            Contractures Contractures Info: Not present    Additional Factors Info  Code Status, Allergies Code Status Info: Full Code Allergies Info: No Known Allergies           Current Medications (11/11/2017):  This is the current hospital active medication list Current Facility-Administered Medications  Medication Dose Route Frequency Provider Last Rate Last Dose  . acetaminophen (TYLENOL) tablet 650 mg  650 mg Oral Q6H PRN Rise Patience, MD   650 mg at 11/08/17 4562   Or  . acetaminophen (TYLENOL) suppository 650 mg  650 mg Rectal Q6H PRN Rise Patience, MD      . albuterol (PROVENTIL) (2.5 MG/3ML) 0.083% nebulizer solution 2.5 mg  2.5 mg Nebulization Q6H PRN Rise Patience, MD      . apixaban Arne [place]) tablet 5 mg  5 mg Oral BID Rise Patience, MD   5 mg at 11/11/17 0753  . atorvastatin (LIPITOR) tablet 40 mg  40 mg Oral q1800 Rise Patience, MD   40 mg at 11/10/17 1806  . benzonatate (  TESSALON) capsule 200 mg  200 mg Oral TID PRN Rise Patience, MD      . bethanechol (URECHOLINE) tablet 25 mg  25 mg Oral TID Rise Patience, MD   25 mg at 11/11/17 6063  . [START ON 11/12/2017] ceFEPIme (MAXIPIME) 2 g in sodium chloride 0.9 % 100 mL IVPB  2 g Intravenous Q24H Elicia Lamp P, RPH      . hydroxypropyl methylcellulose / hypromellose (ISOPTO TEARS / GONIOVISC) 2.5 % ophthalmic solution 1 drop  1 drop Both Eyes Q6H PRN Rise Patience, MD      . latanoprost (XALATAN) 0.005 % ophthalmic solution 1 drop  1 drop Both Eyes QHS Rise Patience, MD   1 drop at  11/10/17 2119  . levothyroxine (SYNTHROID, LEVOTHROID) tablet 125 mcg  125 mcg Oral QAC breakfast Rise Patience, MD   125 mcg at 11/11/17 0750  . ondansetron (ZOFRAN) tablet 4 mg  4 mg Oral Q6H PRN Rise Patience, MD       Or  . ondansetron [facility]) injection 4 mg  4 mg Intravenous Q6H PRN Rise Patience, MD      . pregabalin (LYRICA) capsule 75 mg  75 mg Oral BID Rise Patience, MD   75 mg at 11/11/17 0753  . tamsulosin (FLOMAX) capsule 0.4 mg  0.4 mg Oral QHS Rise Patience, MD   0.4 mg at 11/10/17 2119     Discharge Medications: Please see discharge summary for a list of discharge medications.  Relevant Imaging Results:  Relevant Lab Results:   Additional Information SS# [place], [place]

## 2017-11-11 NOTE — Progress Notes (Signed)
Physical Therapy Treatment Patient Details Name: Derek Hays MRN: [id number] DOB: 1930/12/30 Today's Date: 11/11/2017    History of Present Illness Derek Hays is a 84 y.o. male with history of CVA, afib, HTN, recently admitted in February with left-sided hemiplegia was noticed by patient's home health aide that patient was more weaker than usual on the left side.  As per the patient he did not find any difference. Patient also was found to have fever at home of temperature 100 F, denies any other symptoms. In the ER patient was hypotensive, febrile with elevated lactate.  Chest x-ray showed some mild pleural effusion, UA is unremarkable. ER physician discussed with on-call neurologist Dr. Leonel Ramsay who advised no stroke work-up and symptoms may be related to patient's possible developing sepsis and hypotension.     PT Comments    Pt admitted with above diagnosis. Pt currently with functional limitations due to balance and endurance deficits. Pt was able to sit at EOB upright for up to a minute with cues and min guard assist.  Pt stood to [place] x 3 with max assist to stand and min to mod  Assist  to maintain standing for up to 30 seconds at a time.  Pt will benefit from skilled PT to increase their independence and safety with mobility to allow discharge to the venue listed below.     Follow Up Recommendations  SNF;Supervision/Assistance - 24 hour     Equipment Recommendations  None recommended by PT    Recommendations for Other Services       Precautions / Restrictions Precautions Precautions: Fall Required Braces or Orthoses: Sling(could not find pts sling on Mon, 7/29) Restrictions Weight Bearing Restrictions: No    Mobility  Bed Mobility Overal bed mobility: Needs Assistance Bed Mobility: Supine to Sit     Supine to sit: Max assist;+2 for physical assistance;HOB elevated     General bed mobility comments: Pt needed max assist for LEs off bed and to elevate trunk.  needs incr time to achieve upright sitting with pt leaning to right initially.    Transfers Overall transfer level: Needs assistance Equipment used: Ambulation equipment used Transfers: Sit to/from Stand Sit to Stand: Max assist;+2 physical assistance;From elevated surface         General transfer comment: Pt stood x 3 x to [place].  First attempt stood for 30 seconds and pt having BM.  Sat pt on the seat and moved in front of recliner.  Pt stood and PT cleaned pt again and pt still with BM.  Sat pt on chair and pt rested.  Cleaned pt and changed gown and socks and stood again to clean under pt with pt standing 30 seconds to be cleaned and get new pad.  Pt clean.  Left pt in chair.  Nurse aware to use Maximove to get pt back to bed.   Ambulation/Gait                 Stairs             Wheelchair Mobility    Modified Rankin (Stroke Patients Only)       Balance Overall balance assessment: Needs assistance Sitting-balance support: Single extremity supported;Feet supported Sitting balance-Leahy Scale: Fair Sitting balance - Comments: Pt could sit without UE support but could not withstand any challenges to balance without needing mod assist.  When pt performing exercises, pt leaning posteriorly and needing steadying support. Also took incr time to get pt to upright sitting  position as pt had trouble attaining upright sitting.    Standing balance support: Single extremity supported;During functional activity Standing balance-Leahy Scale: Poor Standing balance comment: stood with Stedy with min to mod assist with cues for up to 30 seconds                             Cognition Arousal/Alertness: Awake/alert Behavior During Therapy: Anxious Overall Cognitive Status: Within Functional Limits for tasks assessed                                        Exercises General Exercises - Lower Extremity Ankle Circles/Pumps: AAROM;Both;5  reps;Seated Long Arc Quad: AAROM;Both;10 reps;Seated Hip Flexion/Marching: Right;AROM;5 reps;Seated    General Comments        Pertinent Vitals/Pain Pain Assessment: No/denies pain    Home Living                      Prior Function            PT Goals (current goals can now be found in the care plan section) Acute Rehab PT Goals Patient Stated Goal: go to SNF for therapy Progress towards PT goals: Progressing toward goals    Frequency    Min 3X/week      PT Plan Current plan remains appropriate    Co-evaluation              AM-PAC PT "6 Clicks" Daily Activity  Outcome Measure  Difficulty turning over in bed (including adjusting bedclothes, sheets and blankets)?: Unable Difficulty moving from lying on back to sitting on the side of the bed? : Unable Difficulty sitting down on and standing up from a chair with arms (e.g., wheelchair, bedside commode, etc,.)?: Unable Help needed moving to and from a bed to chair (including a wheelchair)?: Total Help needed walking in hospital room?: Total Help needed climbing 3-5 steps with a railing? : Total 6 Click Score: 6    End of Session Equipment Utilized During Treatment: Gait belt Activity Tolerance: Patient limited by fatigue Patient left: with call bell/phone within reach;in chair;with chair alarm set Nurse Communication: Mobility status;Need for lift equipment PT Visit Diagnosis: Unsteadiness on feet (R26.81);Muscle weakness (generalized) (M62.81);Hemiplegia and hemiparesis Hemiplegia - Right/Left: Left Hemiplegia - dominant/non-dominant: Non-dominant Hemiplegia - caused by: Cerebral infarction     Time: 0350-0938 PT Time Calculation (min) (ACUTE ONLY): 47 min  Charges:  $Therapeutic Exercise: 8-22 mins $Therapeutic Activity: 23-37 mins                     [facility] Acute Rehabilitation [phone] (pager)    Denice Paradise 11/11/2017, 5:15 PM

## 2017-11-11 NOTE — Progress Notes (Signed)
PROGRESS NOTE  Derek Hays SNK:[id number] DOB: 1931-01-25 DOA: 11/05/2017 [facility]  HPI/Recap of past 24 hours: EDDRICK DILONE is a 84 y.o. male with history of CVA, afib, HTN, recently admitted in February with left-sided hemiplegia was noticed by patient's home health aide that patient was more weaker than usual on the left side.  As per the patient he did not find any difference. Patient also was found to have fever at home of temperature 100 F, denies any other symptoms. In the ER patient was hypotensive, febrile with elevated lactate.  Chest x-ray showed some mild pleural effusion, UA is unremarkable. ER physician discussed with on-call neurologist Dr. Leonel Ramsay who advised no stroke work-up and symptoms may be related to patient's possible developing sepsis and hypotension. Patient blood pressure improved with fluids.  Was started on empiric antibiotics after blood cultures obtained for possible developing sepsis.  Today, pt denies any new complaints. RN reported multiple loose-liquid BM yesterday and today. Pt denies any abdominal pain, fever/chills, cough, N/V, chest pain.    Assessment/Plan: Principal Problem:   SIRS (systemic inflammatory response syndrome) (HCC) Active Problems:   Right middle cerebral artery stroke (HCC)   Stage 3 chronic kidney disease (HCC)   PAF (paroxysmal atrial fibrillation) (HCC)   Permanent atrial fibrillation (HCC)  Sepsis likely due to HCAP Resolved Febrile, hypotensive, lactic acidosis, on admission Afebrile, with resolved leukocytosis Lactic acidosis resolved Procalcitonin elevated, slowly down trending UA negative, UC no growth BC X 2 NGTD CXR small pleural effusion, no consolidation noted, repeat suggestive of pulmonary congestion Vs worsening bibasilar opacities, atelectasis, Vs infiltrates S/P 7 days IV AB (Vanc, zosyn, cefepime)  Acute metabolic encephalopathy Resolved Likely due to above CT head showed no  evidence of acute intracranial abnormality, chronic ischemia including a large R MCA infarct  ?Acute on chronic diastolic HF BNP 937 CXR with worsening congestion, will repeat in am ECHO 05/2017 showed: EF of 60-65%, Grade 2 DD S/P IV lasix 20 mg daily, will hold due to bump in Cr and multiple episodes of diarrhea Monitor renal fxn  Hx of CVA with residual left sided weakness CT as above Monitor closely  AKI on CKD stage III Likely due to sepsis as bove S/P IVF Avoid nephrotoxics, daily BMP  Paroxysmal Afib Rate controlled Continue eliquis  HTN Stable  Hypothyroidism Continue synthroid  HLD Continue lipitor     Code Status: Full  Family Communication: None at bedside  Disposition Plan: SNF   Consultants:  None   Procedures:  None  Antimicrobials: S/P Cefepime  DVT prophylaxis: Eliquis   Objective: Vitals:   11/10/17 2008 11/11/17 0006 11/11/17 0400 11/11/17 0607  BP: 139/81 125/70 109/71   Pulse: 60  60   Resp: 16 18 16    Temp: 98.3 F (36.8 C) 98.2 F (36.8 C) 97.8 F (36.6 C)   TempSrc: Oral Oral Axillary   SpO2: 98% 99% 99%   Weight:    86.4 kg (190 lb 7.6 oz)  Height:        Intake/Output Summary (Last 24 hours) at 11/11/2017 1629 Last data filed at 11/11/2017 1300 Gross per 24 hour  Intake 821.09 ml  Output 1550 ml  Net -728.91 ml   Filed Weights   11/09/17 0540 11/10/17 0418 11/11/17 0607  Weight: 91.7 kg (202 lb 2.6 oz) 87 kg (191 lb 12.8 oz) 86.4 kg (190 lb 7.6 oz)    Exam:   General: NAD, alert,oriented  Cardiovascular: S1, S2 present  Respiratory: Diminished BS   Abdomen: Soft, NT, ND, BS present  Musculoskeletal: Trace RLE and LLE edema  Skin: Normal  Psychiatry: Normal mood   Data Reviewed: CBC: Recent Labs  Lab 11/06/17 0509 11/07/17 0337 11/08/17 0317 11/09/17 0313 11/10/17 0508  WBC 7.8 6.3 5.7 6.7 7.1  NEUTROABS 6.0 4.3 3.4 3.9 4.1  HGB 11.3* 10.7* 11.1* 12.2* 11.6*  HCT 34.8* 33.1* 34.1*  37.6* 35.7*  MCV 89.2 89.5 87.7 87.0 87.7  PLT 131* 131* 145* 169 161   Basic Metabolic Panel: Recent Labs  Lab 11/07/17 0337 11/08/17 0317 11/09/17 0313 11/10/17 0508 11/11/17 0347  NA 139 139 141 141 142  K 3.7 3.8 3.6 3.0* 3.7  CL 110 107 106 106 106  CO2 23 24 24 28 27   GLUCOSE 85 82 82 88 88  BUN 23 21 16 13 18   CREATININE 1.24 1.23 1.31* 1.28* 1.42*  CALCIUM 8.0* 8.6* 9.0 8.8* 9.0  MG 1.8  --   --   --   --    GFR: Estimated Creatinine Clearance: 42.5 mL/min (A) (by C-G formula based on SCr of 1.42 mg/dL (H)). Liver Function Tests: Recent Labs  Lab 11/05/17 1945 11/06/17 0509  AST 19 14*  ALT 9 9  ALKPHOS 67 51  BILITOT 1.4* 0.9  PROT 6.0* 4.4*  ALBUMIN 2.9* 2.2*   No results for input(s): LIPASE, AMYLASE in the last 168 hours. No results for input(s): AMMONIA in the last 168 hours. Coagulation Profile: Recent Labs  Lab 11/05/17 1945 11/06/17 0509  INR 1.80 1.81   Cardiac Enzymes: No results for input(s): CKTOTAL, CKMB, CKMBINDEX, TROPONINI in the last 168 hours. BNP (last 3 results) No results for input(s): PROBNP in the last 8760 hours. HbA1C: No results for input(s): HGBA1C in the last 72 hours. CBG: Recent Labs  Lab 11/05/17 2243  GLUCAP 114*   Lipid Profile: No results for input(s): CHOL, HDL, LDLCALC, TRIG, CHOLHDL, LDLDIRECT in the last 72 hours. Thyroid Function Tests: No results for input(s): TSH, T4TOTAL, FREET4, T3FREE, THYROIDAB in the last 72 hours. Anemia Panel: No results for input(s): VITAMINB12, FOLATE, FERRITIN, TIBC, IRON, RETICCTPCT in the last 72 hours. Urine analysis:    Component Value Date/Time   COLORURINE YELLOW 11/05/2017 2112   APPEARANCEUR HAZY (A) 11/05/2017 2112   LABSPEC 1.016 11/05/2017 2112   PHURINE 5.0 11/05/2017 2112   GLUCOSEU NEGATIVE 11/05/2017 2112   HGBUR NEGATIVE 11/05/2017 2112   BILIRUBINUR NEGATIVE 11/05/2017 2112   KETONESUR NEGATIVE 11/05/2017 2112   PROTEINUR NEGATIVE 11/05/2017 2112    NITRITE NEGATIVE 11/05/2017 2112   LEUKOCYTESUR NEGATIVE 11/05/2017 2112   Sepsis Labs: @LABRCNTIP (procalcitonin:4,lacticidven:4)  ) Recent Results (from the past 240 hour(s))  Blood Culture (routine x 2)     Status: None   Collection Time: 11/05/17  7:45 PM  Result Value Ref Range Status   Specimen Description BLOOD LEFT HAND  Final   Special Requests   Final    BOTTLES DRAWN AEROBIC AND ANAEROBIC Blood Culture adequate volume   Culture   Final    NO GROWTH 5 DAYS Performed at [facility], [place] [address], [place],  [address]    Report Status 11/10/2017 FINAL  Final  Blood Culture (routine x 2)     Status: None   Collection Time: 11/05/17  8:05 PM  Result Value Ref Range Status   Specimen Description BLOOD RIGHT FOREARM  Final   Special Requests   Final    BOTTLES DRAWN AEROBIC  AND ANAEROBIC Blood Culture results may not be optimal due to an inadequate volume of blood received in culture bottles   Culture   Final    NO GROWTH 5 DAYS Performed at [facility] [address], [place], [place] [address]    Report Status 11/10/2017 FINAL  Final  Urine culture     Status: None   Collection Time: 11/05/17  9:12 PM  Result Value Ref Range Status   Specimen Description URINE, CATHETERIZED  Final   Special Requests NONE  Final   Culture   Final    NO GROWTH Performed at [facility], [place] [address] [place], [place] [address]    Report Status 11/07/2017 FINAL  Final  MRSA PCR Screening     Status: None   Collection Time: 11/08/17  4:05 PM  Result Value Ref Range Status   MRSA by PCR NEGATIVE NEGATIVE Final    Comment:        The GeneXpert MRSA Assay (FDA approved for NASAL specimens only), is one component of a comprehensive MRSA colonization surveillance program. It is not intended to diagnose MRSA infection nor to guide or monitor treatment for MRSA infections. Performed at [facility], [place] [address] [place], [place] [address]        Studies: No results found.  Scheduled Meds: . apixaban  5 mg Oral BID  . atorvastatin  40 mg Oral q1800  . bethanechol  25 mg Oral TID  . latanoprost  1 drop Both Eyes QHS  . levothyroxine  125 mcg Oral QAC breakfast  . pregabalin  75 mg Oral BID  . tamsulosin  0.4 mg Oral QHS    Continuous Infusions:    LOS: 6 days     Alma Friendly, MD [facility]  If 7PM-7AM, please contact night-coverage [email or web address] Password [facility] 11/11/2017, 4:29 PM

## 2017-11-12 ENCOUNTER — Inpatient Hospital Stay (HOSPITAL_COMMUNITY): Payer: Medicare Other

## 2017-11-12 LAB — CBC WITH DIFFERENTIAL/PLATELET
Abs Immature Granulocytes: 0 10*3/uL (ref 0.0–0.1)
BASOS ABS: 0.1 10*3/uL (ref 0.0–0.1)
BASOS PCT: 1 %
EOS ABS: 0.6 10*3/uL (ref 0.0–0.7)
Eosinophils Relative: 8 %
HCT: 38.8 % — ABNORMAL LOW (ref 39.0–52.0)
Hemoglobin: 12.6 g/dL — ABNORMAL LOW (ref 13.0–17.0)
Immature Granulocytes: 0 %
Lymphocytes Relative: 20 %
Lymphs Abs: 1.6 10*3/uL (ref 0.7–4.0)
MCH: 28.4 pg (ref 26.0–34.0)
MCHC: 32.5 g/dL (ref 30.0–36.0)
MCV: 87.6 fL (ref 78.0–100.0)
MONOS PCT: 8 %
Monocytes Absolute: 0.6 10*3/uL (ref 0.1–1.0)
NEUTROS ABS: 4.8 10*3/uL (ref 1.7–7.7)
NEUTROS PCT: 63 %
PLATELETS: 204 10*3/uL (ref 150–400)
RBC: 4.43 MIL/uL (ref 4.22–5.81)
RDW: 15.3 % (ref 11.5–15.5)
WBC: 7.7 10*3/uL (ref 4.0–10.5)

## 2017-11-12 LAB — BASIC METABOLIC PANEL
ANION GAP: 9 (ref 5–15)
BUN: 16 mg/dL (ref 8–23)
CALCIUM: 9.2 mg/dL (ref 8.9–10.3)
CO2: 28 mmol/L (ref 22–32)
Chloride: 105 mmol/L (ref 98–111)
Creatinine, Ser: 1.35 mg/dL — ABNORMAL HIGH (ref 0.61–1.24)
GFR calc Af Amer: 54 mL/min — ABNORMAL LOW (ref >60)
GFR, EST NON AFRICAN AMERICAN: 47 mL/min — AB (ref >60)
GLUCOSE: 88 mg/dL (ref 70–99)
Potassium: 3.1 mmol/L — ABNORMAL LOW (ref 3.5–5.1)
SODIUM: 142 mmol/L (ref 135–145)

## 2017-11-12 NOTE — Progress Notes (Signed)
Clinical Social Worker following patient for support and discharge needs. Patient and wife had chosen to discharge to [facility] as long as insurance is able to cover patients stay at rehab. CSW spoke with administration for [facility] and they stated that because patient has been at a SNF recently and had used a larger amount of his days, Medicare will only pay for 3 days and if patients stays in a facility past the 3 days [employer] maybe able to pick up the rest but only if facility has a contract with [employer]. CSW informed spouse that unfortunately  [facility] does not have a contract with [employer] but other rehab facilities do. Spouse stated she will be unable to drive to those other facilities because they are to far from home. Spouse stated she will take patient home with Home Health following and will implement other support system in the home. CSW contacted RNCM covering floor to assist with needs at home.   [name], MSW,  [place]

## 2017-11-12 NOTE — Discharge Summary (Signed)
Discharge Summary  Derek Hays TDD:[id number] DOB: 03/24/1931  [facility]  Admit date: 11/05/2017 Discharge date: 11/12/2017  Time spent: 40 mins  Recommendations for Outpatient Follow-up:  1. PCP  Discharge Diagnoses:  Active Hospital Problems   Diagnosis Date Noted  . SIRS (systemic inflammatory response syndrome) ([place]) 11/05/2017  . Permanent atrial fibrillation ([place])   . Stage 3 chronic kidney disease ([place])   . PAF (paroxysmal atrial fibrillation) ([place])   . 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    Discharge Condition: Stable  Diet recommendation: Heart healthy  Vitals:   11/12/17 0712 11/12/17 1225  BP: (!) 136/98   Pulse: 63 60  Resp:  18  Temp: 97.6 F (36.4 C)   SpO2: 92%     History of present illness:  Derek Hays a 84 y.o.malewithhistory of CVA, afib, HTN, recently admitted in February with left-sided hemiplegia was noticed by patient's home health aide that patient was more weaker than usual on the left side. As per the patient he did not find any difference. Patient also was found to have fever at home of temperature 100 F, denies any other symptoms. In the ER patient was hypotensive, febrile with elevated lactate. Chest x-ray showed some mild pleural effusion, UA is unremarkable. ER physician discussed with on-call neurologist Dr. Leonel Ramsay who advised no stroke work-up and symptoms may be related to patient's possible developing sepsis and hypotension. Patient blood pressure improved with fluids. Was started on empiric antibiotics after blood cultures obtained for possible developing sepsis.  Today, pt denies any new complaints. Stable for d/c home with [facility] aide, RN, PT/OT/SLP as pt insurance wont cover SNF.   Hospital Course:  Principal Problem:   SIRS (systemic inflammatory response syndrome) (HCC) Active Problems:   Right middle  cerebral artery stroke (HCC)   Stage 3 chronic kidney disease (HCC)   PAF (paroxysmal atrial fibrillation) (HCC)   Permanent atrial fibrillation (HCC)  Sepsis likely due to HCAP Resolved Febrile, hypotensive, lactic acidosis, on admission Afebrile, with resolved leukocytosis Lactic acidosis resolved Procalcitonin elevated, slowly down trending UA negative, UC no growth BC X 2 NGTD CXR small pleural effusion, no consolidation noted, repeat suggestive of pulmonary congestion Vs worsening bibasilar opacities, atelectasis, Vs infiltrates S/P 7 days IV AB (Vanc, zosyn, cefepime)  Acute metabolic encephalopathy Resolved Likely due to above CT head showed no evidence of acute intracranial abnormality, chronic ischemia including a large R MCA infarct  Acute on chronic diastolic HF BNP 623 CXR with worsening congestion, will repeat in am ECHO 05/2017 showed: EF of 60-65%, Grade 2 DD S/P IV lasix 20 mg daily PCP to follow up  Hx of CVA with residual left sided weakness CT as above Monitor closely  AKI on CKD stage III Likely due to sepsis as bove S/P gentle IVF  Paroxysmal Afib Rate controlled Continue eliquis  HTN Stable  Hypothyroidism Continue synthroid  HLD Continue lipitor      Procedures:  None  Consultations:  None  Discharge Exam: BP (!) 136/98 (BP Location: Right Arm)   Pulse 60   Temp 97.6 F (36.4 C) (Oral)   Resp 18   Ht 6' (1.829 m)   Wt 88.3 kg (194 lb 10.7 oz)   SpO2 92%   BMI 26.40 kg/m   General: NAD Cardiovascular: S1, S2 present Respiratory: Diminished BS bilaterally  Discharge Instructions You were cared for by a hospitalist during your  hospital stay. If you have any questions about your discharge medications or the care you received while you were in the hospital after you are discharged, you can call the unit and asked to speak with the hospitalist on call if the hospitalist that took care of you is not available. Once  you are discharged, your primary care physician will handle any further medical issues. Please note that NO REFILLS for any discharge medications will be authorized once you are discharged, as it is imperative that you return to your primary care physician (or establish a relationship with a primary care physician if you do not have one) for your aftercare needs so that they can reassess your need for medications and monitor your lab values.   Allergies as of 11/12/2017   No Known Allergies     Medication List    TAKE these medications   albuterol (2.5 MG/3ML) 0.083% nebulizer solution Commonly known as:  PROVENTIL Take 3 mLs (2.5 mg total) by nebulization every 6 (six) hours. What changed:    when to take this  reasons to take this   amLODipine 10 MG tablet Commonly known as:  NORVASC Take 1 tablet (10 mg total) by mouth daily.   apixaban 5 MG Tabs tablet Commonly known as:  ELIQUIS Take 1 tablet (5 mg total) by mouth 2 (two) times daily.   atorvastatin 40 MG tablet Commonly known as:  LIPITOR Take 1 tablet (40 mg total) by mouth daily at 6 PM.   benzonatate 100 MG capsule Commonly known as:  TESSALON Take 200 mg by mouth 3 (three) times daily as needed for cough.   bethanechol 25 MG tablet Commonly known as:  URECHOLINE Take 1 tablet (25 mg total) by mouth 3 (three) times daily. What changed:  additional instructions   levothyroxine 125 MCG tablet Commonly known as:  SYNTHROID, LEVOTHROID Take 125 mcg by mouth daily.   pregabalin 75 MG capsule Commonly known as:  LYRICA Take 75 mg by mouth 2 (two) times daily.   REFRESH TEARS 0.5 % Soln Generic drug:  carboxymethylcellulose Place 1 drop into both eyes every 6 (six) hours as needed (dry eyes).   tamsulosin 0.4 MG Caps capsule Commonly known as:  FLOMAX Take 1 capsule (0.4 mg total) by mouth daily after supper. What changed:  when to take this   XALATAN 0.005 % ophthalmic solution Generic drug:   latanoprost Place 1 drop into both eyes at bedtime.      No Known Allergies Follow-up Information    [facility] Schedule an appointment as soon as possible for a visit in 1 week(s).   Contact information: [place] [address] [phone]            The results of significant diagnostics from this hospitalization (including imaging, microbiology, ancillary and laboratory) are listed below for reference.    Significant Diagnostic Studies: Dg Chest 2 View  Result Date: 11/05/2017 CLINICAL DATA:  Difficulty raising the left arm, drank water and did not agree with him, history of atrial fibrillation and hypertension EXAM: CHEST - 2 VIEW COMPARISON:  09/23/2017, 08/11/2017 FINDINGS: Left-sided pacing device as before. Mild cardiomegaly. No acute airspace disease. Small pleural effusions. Aortic atherosclerosis. No pneumothorax. IMPRESSION: Small pleural effusions and cardiomegaly. Electronically Signed   By: Donavan Foil M.D.   On: 11/05/2017 20:48   Ct Head Wo Contrast  Result Date: 11/06/2017 CLINICAL DATA:  Increased weakness. Prior stroke with left hemiplegia. EXAM: CT HEAD WITHOUT CONTRAST TECHNIQUE:  Contiguous axial images were obtained from the base of the skull through the vertex without intravenous contrast. COMPARISON:  09/23/2017 FINDINGS: Brain: No acute infarct, intracranial hemorrhage, midline shift, or extra-axial fluid collection is identified. A large chronic right MCA infarct is again noted. There are also unchanged small chronic infarcts in the left occipital lobe and left cerebellum. A 10 mm extra-axial mass over the left parietal convexity is unchanged and consistent with a meningioma. Mild chronic small vessel ischemic disease is noted in the cerebral white matter. Vascular: Calcified atherosclerosis at the skull base. No hyperdense vessel. Skull: No fracture or focal osseous lesion. Sinuses/Orbits: Visualized paranasal sinuses and mastoid  air cells are clear. Bilateral cataract extraction is noted. Other: None. IMPRESSION: 1. No evidence of acute intracranial abnormality. 2. Chronic ischemia including a large right MCA infarct. 3. 10 mm left parietal meningioma. Electronically Signed   By: Logan Bores M.D.   On: 11/06/2017 08:32   Dg Chest Port 1 View  Result Date: 11/12/2017 CLINICAL DATA:  CHF EXAM: PORTABLE CHEST 1 VIEW COMPARISON:  11/08/2017 FINDINGS: Left pacer remains in place, unchanged. Cardiomegaly, improved. Diffuse aortic atherosclerosis. Improving aeration at the left base. Continued patchy right perihilar and infrahilar airspace opacities which could reflect asymmetric edema or infection, slightly improved. IMPRESSION: Improving cardiomegaly and left base opacity. Improving perihilar and lower lobe opacity with mild residual asymmetric edema or infection. Electronically Signed   By: Rolm Baptise M.D.   On: 11/12/2017 09:20   Dg Chest Port 1 View  Result Date: 11/08/2017 CLINICAL DATA:  Sepsis EXAM: PORTABLE CHEST 1 VIEW COMPARISON:  11/05/2017; 09/23/2017; 08/11/2017 FINDINGS: Grossly unchanged enlarged cardiac silhouette and mediastinal contours with atherosclerotic plaque within thoracic aorta. Stable position of support apparatus. The lungs remain hyperexpanded with flattening the diaphragms and thinning of pulmonary parenchyma within the bilateral lung apices. Pulmonary vasculature appears less distinct on the present examination with cephalization of flow. Ill-defined heterogeneous opacities are seen within the right mid and lower lung as well as the left lower lung. Trace bilateral pleural effusions are suspected. No pneumothorax. No acute osseus abnormalities. Degenerative change of the bilateral acromioclavicular and left glenohumeral joints, incompletely evaluated. IMPRESSION: Findings most suggestive of pulmonary edema with suspected trace bilateral effusions and worsening bibasilar opacities, atelectasis versus  infiltrate. Electronically Signed   By: Sandi Mariscal M.D.   On: 11/08/2017 08:12    Microbiology: Recent Results (from the past 240 hour(s))  Blood Culture (routine x 2)     Status: None   Collection Time: 11/05/17  7:45 PM  Result Value Ref Range Status   Specimen Description BLOOD LEFT HAND  Final   Special Requests   Final    BOTTLES DRAWN AEROBIC AND ANAEROBIC Blood Culture adequate volume   Culture   Final    NO GROWTH 5 DAYS Performed at [facility], [address] [place], [place] [address]    Report Status 11/10/2017 FINAL  Final  Blood Culture (routine x 2)     Status: None   Collection Time: 11/05/17  8:05 PM  Result Value Ref Range Status   Specimen Description BLOOD RIGHT FOREARM  Final   Special Requests   Final    BOTTLES DRAWN AEROBIC AND ANAEROBIC Blood Culture results may not be optimal due to an inadequate volume of blood received in culture bottles   Culture   Final    NO GROWTH 5 DAYS Performed at [facility], [place] [address], [place], [place] [address]  Report Status 11/10/2017 FINAL  Final  Urine culture     Status: None   Collection Time: 11/05/17  9:12 PM  Result Value Ref Range Status   Specimen Description URINE, CATHETERIZED  Final   Special Requests NONE  Final   Culture   Final    NO GROWTH Performed at [facility], [address], [place], [place] [address]    Report Status 11/07/2017 FINAL  Final  MRSA PCR Screening     Status: None   Collection Time: 11/08/17  4:05 PM  Result Value Ref Range Status   MRSA by PCR NEGATIVE NEGATIVE Final    Comment:        The GeneXpert MRSA Assay (FDA approved for NASAL specimens only), is one component of a comprehensive MRSA colonization surveillance program. It is not intended to diagnose MRSA infection nor to guide or monitor treatment for MRSA infections. Performed at [facility], [place] [address] [place],  [address]   C difficile quick scan w PCR reflex      Status: None   Collection Time: 11/11/17  5:45 PM  Result Value Ref Range Status   C Diff antigen NEGATIVE NEGATIVE Final   C Diff toxin NEGATIVE NEGATIVE Final   C Diff interpretation No C. difficile detected.  Final     Labs: Basic Metabolic Panel: Recent Labs  Lab 11/07/17 0337 11/08/17 0317 11/09/17 0313 11/10/17 0508 11/11/17 0347 11/12/17 0348  NA 139 139 141 141 142 142  K 3.7 3.8 3.6 3.0* 3.7 3.1*  CL 110 107 106 106 106 105  CO2 23 24 24 28 27 28   GLUCOSE 85 82 82 88 88 88  BUN 23 21 16 13 18 16   CREATININE 1.24 1.23 1.31* 1.28* 1.42* 1.35*  CALCIUM 8.0* 8.6* 9.0 8.8* 9.0 9.2  MG 1.8  --   --   --   --   --    Liver Function Tests: Recent Labs  Lab 11/05/17 1945 11/06/17 0509  AST 19 14*  ALT 9 9  ALKPHOS 67 51  BILITOT 1.4* 0.9  PROT 6.0* 4.4*  ALBUMIN 2.9* 2.2*   No results for input(s): LIPASE, AMYLASE in the last 168 hours. No results for input(s): AMMONIA in the last 168 hours. CBC: Recent Labs  Lab 11/07/17 0337 11/08/17 0317 11/09/17 0313 11/10/17 0508 11/12/17 0348  WBC 6.3 5.7 6.7 7.1 7.7  NEUTROABS 4.3 3.4 3.9 4.1 4.8  HGB 10.7* 11.1* 12.2* 11.6* 12.6*  HCT 33.1* 34.1* 37.6* 35.7* 38.8*  MCV 89.5 87.7 87.0 87.7 87.6  PLT 131* 145* 169 161 204   Cardiac Enzymes: No results for input(s): CKTOTAL, CKMB, CKMBINDEX, TROPONINI in the last 168 hours. BNP: BNP (last 3 results) Recent Labs    11/09/17 0313  BNP 424.5*    ProBNP (last 3 results) No results for input(s): PROBNP in the last 8760 hours.  CBG: Recent Labs  Lab 11/05/17 2243  GLUCAP 114*       Signed:  Alma Friendly, MD [facility] 11/12/2017, 1:24 PM

## 2017-11-12 NOTE — Progress Notes (Signed)
IV's and condom catheter removed. Pt educated. Discharge papers turned over to [facility] along with pt belongings. Jerald Kief

## 2017-11-12 NOTE — Care Management Note (Signed)
Case Management Note  Patient Details  Name: Derek Hays MRN: [id number] Date of Birth: Sep 17, 1930  Subjective/Objective:                    Action/Plan: Pt is in his copay days for SNF. CM spoke to his wife and she wants him to return home with [facility] services. He was active with [facility] and she wants to continue with them. Tiffany with [facility] notified of the resumption of services. Mrs Gerstner asked that the patient be transported home via [facility]. CM spoke to the bedside RN and she states patient will be ready at 3:30 pm. CM called Mrs Dillenburg and she is in agreement with this time. [facility] called and CM arranged transport for 3:30. Transport form on the front of the chart. Bedside RN aware.   Expected Discharge Date:  11/12/17               Expected Discharge Plan:  [place]  In-House Referral:     Discharge planning Services  CM Consult  Post Acute Care Choice:  Home Health, Resumption of Svcs/PTA Provider Choice offered to:  Patient, Spouse  DME Arranged:    DME Agency:     HH Arranged:  RN, PT, OT, Nurse's Aide, Speech Therapy HH Agency:  [facility] ([facility]  Status of Service:  Completed, signed off  If discussed at [employer], dates discussed:    Additional Comments:  Pollie Friar, RN 11/12/2017, 1:46 PM

## 2018-01-06 ENCOUNTER — Ambulatory Visit: Admitting: Physical Medicine & Rehabilitation

## 2018-03-31 ENCOUNTER — Emergency Department (HOSPITAL_COMMUNITY): Payer: Medicare Other

## 2018-03-31 ENCOUNTER — Encounter (HOSPITAL_COMMUNITY): Payer: Self-pay

## 2018-03-31 ENCOUNTER — Other Ambulatory Visit: Payer: Self-pay

## 2018-03-31 ENCOUNTER — Inpatient Hospital Stay (HOSPITAL_COMMUNITY)
Admission: EM | Admit: 2018-03-31 | Discharge: 2018-04-04 | DRG: 193 | Disposition: A | Discharge status: Skilled Nursing Facility | Payer: Medicare Other | Attending: Internal Medicine | Admitting: Internal Medicine

## 2018-03-31 DIAGNOSIS — I480 Paroxysmal atrial fibrillation: Secondary | ICD-10-CM | POA: Diagnosis present

## 2018-03-31 DIAGNOSIS — R000 Tachycardia, unspecified: Secondary | ICD-10-CM | POA: Diagnosis not present

## 2018-03-31 DIAGNOSIS — E890 Postprocedural hypothyroidism: Secondary | ICD-10-CM | POA: Diagnosis present

## 2018-03-31 DIAGNOSIS — E785 Hyperlipidemia, unspecified: Secondary | ICD-10-CM | POA: Diagnosis present

## 2018-03-31 DIAGNOSIS — J189 Pneumonia, unspecified organism: Principal | ICD-10-CM

## 2018-03-31 DIAGNOSIS — Z87891 Personal history of nicotine dependence: Secondary | ICD-10-CM

## 2018-03-31 DIAGNOSIS — N183 Chronic kidney disease, stage 3 unspecified: Secondary | ICD-10-CM | POA: Diagnosis present

## 2018-03-31 DIAGNOSIS — I5032 Chronic diastolic (congestive) heart failure: Secondary | ICD-10-CM | POA: Diagnosis present

## 2018-03-31 DIAGNOSIS — Z79890 Hormone replacement therapy (postmenopausal): Secondary | ICD-10-CM | POA: Diagnosis not present

## 2018-03-31 DIAGNOSIS — Z8679 Personal history of other diseases of the circulatory system: Secondary | ICD-10-CM | POA: Diagnosis not present

## 2018-03-31 DIAGNOSIS — Z79899 Other long term (current) drug therapy: Secondary | ICD-10-CM

## 2018-03-31 DIAGNOSIS — I447 Left bundle-branch block, unspecified: Secondary | ICD-10-CM | POA: Diagnosis present

## 2018-03-31 DIAGNOSIS — C679 Malignant neoplasm of bladder, unspecified: Secondary | ICD-10-CM | POA: Diagnosis present

## 2018-03-31 DIAGNOSIS — Z825 Family history of asthma and other chronic lower respiratory diseases: Secondary | ICD-10-CM

## 2018-03-31 DIAGNOSIS — J90 Pleural effusion, not elsewhere classified: Secondary | ICD-10-CM | POA: Diagnosis not present

## 2018-03-31 DIAGNOSIS — R531 Weakness: Secondary | ICD-10-CM

## 2018-03-31 DIAGNOSIS — Z6827 Body mass index (BMI) 27.0-27.9, adult: Secondary | ICD-10-CM

## 2018-03-31 DIAGNOSIS — I8310 Varicose veins of unspecified lower extremity with inflammation: Secondary | ICD-10-CM | POA: Diagnosis present

## 2018-03-31 DIAGNOSIS — J918 Pleural effusion in other conditions classified elsewhere: Secondary | ICD-10-CM | POA: Diagnosis not present

## 2018-03-31 DIAGNOSIS — H02402 Unspecified ptosis of left eyelid: Secondary | ICD-10-CM | POA: Diagnosis present

## 2018-03-31 DIAGNOSIS — I639 Cerebral infarction, unspecified: Secondary | ICD-10-CM | POA: Diagnosis not present

## 2018-03-31 DIAGNOSIS — I69321 Dysphasia following cerebral infarction: Secondary | ICD-10-CM | POA: Diagnosis not present

## 2018-03-31 DIAGNOSIS — R404 Transient alteration of awareness: Secondary | ICD-10-CM | POA: Diagnosis not present

## 2018-03-31 DIAGNOSIS — Z950 Presence of cardiac pacemaker: Secondary | ICD-10-CM

## 2018-03-31 DIAGNOSIS — I130 Hypertensive heart and chronic kidney disease with heart failure and stage 1 through stage 4 chronic kidney disease, or unspecified chronic kidney disease: Secondary | ICD-10-CM | POA: Diagnosis present

## 2018-03-31 DIAGNOSIS — Z7901 Long term (current) use of anticoagulants: Secondary | ICD-10-CM | POA: Diagnosis not present

## 2018-03-31 DIAGNOSIS — R0689 Other abnormalities of breathing: Secondary | ICD-10-CM | POA: Diagnosis not present

## 2018-03-31 DIAGNOSIS — Z87440 Personal history of urinary (tract) infections: Secondary | ICD-10-CM | POA: Diagnosis not present

## 2018-03-31 DIAGNOSIS — N189 Chronic kidney disease, unspecified: Secondary | ICD-10-CM | POA: Diagnosis not present

## 2018-03-31 DIAGNOSIS — M2550 Pain in unspecified joint: Secondary | ICD-10-CM | POA: Diagnosis not present

## 2018-03-31 DIAGNOSIS — J9601 Acute respiratory failure with hypoxia: Secondary | ICD-10-CM | POA: Diagnosis present

## 2018-03-31 DIAGNOSIS — Z8551 Personal history of malignant neoplasm of bladder: Secondary | ICD-10-CM | POA: Diagnosis not present

## 2018-03-31 DIAGNOSIS — E0590 Thyrotoxicosis, unspecified without thyrotoxic crisis or storm: Secondary | ICD-10-CM | POA: Diagnosis not present

## 2018-03-31 DIAGNOSIS — I69322 Dysarthria following cerebral infarction: Secondary | ICD-10-CM | POA: Diagnosis not present

## 2018-03-31 DIAGNOSIS — R410 Disorientation, unspecified: Secondary | ICD-10-CM | POA: Diagnosis not present

## 2018-03-31 DIAGNOSIS — R339 Retention of urine, unspecified: Secondary | ICD-10-CM | POA: Diagnosis present

## 2018-03-31 DIAGNOSIS — N281 Cyst of kidney, acquired: Secondary | ICD-10-CM | POA: Diagnosis not present

## 2018-03-31 DIAGNOSIS — I69354 Hemiplegia and hemiparesis following cerebral infarction affecting left non-dominant side: Secondary | ICD-10-CM | POA: Diagnosis not present

## 2018-03-31 DIAGNOSIS — R1312 Dysphagia, oropharyngeal phase: Secondary | ICD-10-CM | POA: Diagnosis not present

## 2018-03-31 DIAGNOSIS — Z7401 Bed confinement status: Secondary | ICD-10-CM

## 2018-03-31 DIAGNOSIS — N136 Pyonephrosis: Secondary | ICD-10-CM | POA: Diagnosis present

## 2018-03-31 DIAGNOSIS — R059 Cough, unspecified: Secondary | ICD-10-CM

## 2018-03-31 DIAGNOSIS — G629 Polyneuropathy, unspecified: Secondary | ICD-10-CM | POA: Diagnosis present

## 2018-03-31 DIAGNOSIS — I872 Venous insufficiency (chronic) (peripheral): Secondary | ICD-10-CM | POA: Diagnosis not present

## 2018-03-31 DIAGNOSIS — R918 Other nonspecific abnormal finding of lung field: Secondary | ICD-10-CM | POA: Diagnosis not present

## 2018-03-31 DIAGNOSIS — Q600 Renal agenesis, unilateral: Secondary | ICD-10-CM | POA: Diagnosis not present

## 2018-03-31 DIAGNOSIS — E860 Dehydration: Secondary | ICD-10-CM | POA: Diagnosis present

## 2018-03-31 DIAGNOSIS — R05 Cough: Secondary | ICD-10-CM

## 2018-03-31 DIAGNOSIS — I69391 Dysphagia following cerebral infarction: Secondary | ICD-10-CM | POA: Diagnosis not present

## 2018-03-31 DIAGNOSIS — N390 Urinary tract infection, site not specified: Secondary | ICD-10-CM

## 2018-03-31 DIAGNOSIS — R1114 Bilious vomiting: Secondary | ICD-10-CM | POA: Diagnosis not present

## 2018-03-31 DIAGNOSIS — J181 Lobar pneumonia, unspecified organism: Secondary | ICD-10-CM | POA: Diagnosis not present

## 2018-03-31 DIAGNOSIS — N1330 Unspecified hydronephrosis: Secondary | ICD-10-CM | POA: Diagnosis not present

## 2018-03-31 DIAGNOSIS — R0902 Hypoxemia: Secondary | ICD-10-CM | POA: Diagnosis not present

## 2018-03-31 DIAGNOSIS — R627 Adult failure to thrive: Secondary | ICD-10-CM | POA: Diagnosis present

## 2018-03-31 DIAGNOSIS — R7989 Other specified abnormal findings of blood chemistry: Secondary | ICD-10-CM | POA: Diagnosis not present

## 2018-03-31 DIAGNOSIS — I509 Heart failure, unspecified: Secondary | ICD-10-CM | POA: Diagnosis not present

## 2018-03-31 DIAGNOSIS — I5030 Unspecified diastolic (congestive) heart failure: Secondary | ICD-10-CM | POA: Diagnosis not present

## 2018-03-31 LAB — COMPREHENSIVE METABOLIC PANEL WITH GFR
ALT: 18 U/L (ref 0–44)
AST: 25 U/L (ref 15–41)
Albumin: 3.1 g/dL — ABNORMAL LOW (ref 3.5–5.0)
Alkaline Phosphatase: 165 U/L — ABNORMAL HIGH (ref 38–126)
Anion gap: 8 (ref 5–15)
BUN: 33 mg/dL — ABNORMAL HIGH (ref 8–23)
CO2: 24 mmol/L (ref 22–32)
Calcium: 9.1 mg/dL (ref 8.9–10.3)
Chloride: 108 mmol/L (ref 98–111)
Creatinine, Ser: 1.64 mg/dL — ABNORMAL HIGH (ref 0.61–1.24)
GFR calc Af Amer: 44 mL/min — ABNORMAL LOW
GFR calc non Af Amer: 38 mL/min — ABNORMAL LOW
Glucose, Bld: 88 mg/dL (ref 70–99)
Potassium: 4.6 mmol/L (ref 3.5–5.1)
Sodium: 140 mmol/L (ref 135–145)
Total Bilirubin: 0.9 mg/dL (ref 0.3–1.2)
Total Protein: 6.7 g/dL (ref 6.5–8.1)

## 2018-03-31 LAB — CBC WITH DIFFERENTIAL/PLATELET
Abs Immature Granulocytes: 0.03 K/uL (ref 0.00–0.07)
Basophils Absolute: 0.1 K/uL (ref 0.0–0.1)
Basophils Relative: 1 %
Eosinophils Absolute: 0.1 K/uL (ref 0.0–0.5)
Eosinophils Relative: 1 %
HCT: 42.9 % (ref 39.0–52.0)
Hemoglobin: 12.9 g/dL — ABNORMAL LOW (ref 13.0–17.0)
Immature Granulocytes: 0 %
Lymphocytes Relative: 18 %
Lymphs Abs: 1.7 K/uL (ref 0.7–4.0)
MCH: 27.4 pg (ref 26.0–34.0)
MCHC: 30.1 g/dL (ref 30.0–36.0)
MCV: 91.1 fL (ref 80.0–100.0)
Monocytes Absolute: 0.7 K/uL (ref 0.1–1.0)
Monocytes Relative: 8 %
Neutro Abs: 6.8 K/uL (ref 1.7–7.7)
Neutrophils Relative %: 72 %
Platelets: 188 K/uL (ref 150–400)
RBC: 4.71 MIL/uL (ref 4.22–5.81)
RDW: 14.6 % (ref 11.5–15.5)
WBC: 9.4 K/uL (ref 4.0–10.5)
nRBC: 0 % (ref 0.0–0.2)

## 2018-03-31 LAB — I-STAT CHEM 8, ED
BUN: 33 mg/dL — ABNORMAL HIGH (ref 8–23)
Calcium, Ion: 1.2 mmol/L (ref 1.15–1.40)
Chloride: 107 mmol/L (ref 98–111)
Creatinine, Ser: 1.6 mg/dL — ABNORMAL HIGH (ref 0.61–1.24)
Glucose, Bld: 86 mg/dL (ref 70–99)
HCT: 39 % (ref 39.0–52.0)
Hemoglobin: 13.3 g/dL (ref 13.0–17.0)
Potassium: 4.5 mmol/L (ref 3.5–5.1)
Sodium: 140 mmol/L (ref 135–145)
TCO2: 26 mmol/L (ref 22–32)

## 2018-03-31 LAB — URINALYSIS, ROUTINE W REFLEX MICROSCOPIC
Bacteria, UA: NONE SEEN
Bilirubin Urine: NEGATIVE
Glucose, UA: NEGATIVE mg/dL
Hgb urine dipstick: NEGATIVE
Ketones, ur: NEGATIVE mg/dL
Nitrite: NEGATIVE
Protein, ur: NEGATIVE mg/dL
Specific Gravity, Urine: 1.016 (ref 1.005–1.030)
WBC, UA: >50 WBC/hpf — ABNORMAL HIGH (ref 0–5)
pH: 5 (ref 5.0–8.0)

## 2018-03-31 LAB — I-STAT CG4 LACTIC ACID, ED: Lactic Acid, Venous: 1.32 mmol/L (ref 0.5–1.9)

## 2018-03-31 MED ORDER — LATANOPROST 0.005 % OP SOLN
1.0000 [drp] | Freq: Every day | OPHTHALMIC | Status: DC
  Administered 2018-03-31 – 2018-04-03 (×4): 1 [drp] via OPHTHALMIC
  Filled 2018-03-31: qty 2.5

## 2018-03-31 MED ORDER — SODIUM CHLORIDE 0.9 % IV SOLN
1.0000 g | Freq: Once | INTRAVENOUS | Status: AC
  Administered 2018-03-31: 1 g via INTRAVENOUS
  Filled 2018-03-31: qty 10

## 2018-03-31 MED ORDER — DEXTROSE 5 % IV SOLN
250.0000 mg | INTRAVENOUS | Status: DC
  Administered 2018-04-01: 250 mg via INTRAVENOUS
  Filled 2018-03-31 (×2): qty 250

## 2018-03-31 MED ORDER — SODIUM CHLORIDE 0.9 % IV SOLN: 1.0000 g | Freq: Once | INTRAVENOUS | Status: DC

## 2018-03-31 MED ORDER — DEXTROSE 5 % IV SOLN: 250.0000 mg | Freq: Once | INTRAVENOUS | Status: DC

## 2018-03-31 MED ORDER — SODIUM CHLORIDE 0.9 % IV SOLN
500.0000 mg | Freq: Once | INTRAVENOUS | Status: AC
  Administered 2018-03-31: 500 mg via INTRAVENOUS
  Filled 2018-03-31: qty 500

## 2018-03-31 MED ORDER — SODIUM CHLORIDE 0.9 % IV SOLN
1.0000 g | INTRAVENOUS | Status: DC
  Administered 2018-04-01 – 2018-04-02 (×2): 1 g via INTRAVENOUS
  Filled 2018-03-31 (×3): qty 10

## 2018-03-31 MED ORDER — APIXABAN 2.5 MG PO TABS
2.5000 mg | ORAL_TABLET | Freq: Two times a day (BID) | ORAL | Status: DC
  Administered 2018-03-31 – 2018-04-04 (×8): 2.5 mg via ORAL
  Filled 2018-03-31 (×8): qty 1

## 2018-03-31 MED ORDER — ATORVASTATIN CALCIUM 40 MG PO TABS
40.0000 mg | ORAL_TABLET | Freq: Every day | ORAL | Status: DC
  Administered 2018-03-31 – 2018-04-03 (×4): 40 mg via ORAL
  Filled 2018-03-31 (×4): qty 1

## 2018-03-31 MED ORDER — LEVOTHYROXINE SODIUM 125 MCG PO TABS
125.0000 ug | ORAL_TABLET | Freq: Every day | ORAL | Status: DC
  Administered 2018-04-02 – 2018-04-04 (×3): 125 ug via ORAL
  Filled 2018-03-31 (×4): qty 1

## 2018-03-31 MED ORDER — SODIUM CHLORIDE 0.9 % IV SOLN
INTRAVENOUS | Status: DC
  Administered 2018-03-31: 21:00:00 via INTRAVENOUS

## 2018-03-31 NOTE — Consult Note (Signed)
Reason for Consult:Chronic Renal insuficiency, bladder cancer, left hydronephrosis.   Referring Physician: Malvin Johns MD  Derek Hays is an 84 y.o. male.   HPI:   1 - Mild Chronic Left Hydronephrosis - left mild-mod hydro since at least 2011 of solitary kidney. Stable / Improved by CT 03/2018.  2 - Solitary Left Kidney / Chronic Renal Insuficiency - Cr 1.2-1.6 x years. Congenital solitary left kidney. Mild left hydro x years that is stable. Wife reports very poor PO intake x 2 weeks.   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 Recieves annual GU care at [facility] and annual cysto surveillance per report within last year. UA from ER with large pyuria but no bacteruria and some ? Left bladder wall thickening concerning for possible recurrence. No pelvic adenopathy / chest masses. No recent hematuria.   PMH sig for CHF, Open Appy, Open Chole, CHF/Pacemaker, CVA/Hemiparesis/Eliquus.   Today "Derek Hays" is seen in consultation for above. He is beign admitted through [facility] ER for  pneumonia / failure to thrive but noted to have some pyuria and above findings.   Past Medical History:  Diagnosis Date  . Atrial fibrillation ([place])   . Bladder cancer ([place]) 1984  . Hypertension   . Stroke [facility])     Past Surgical History:  Procedure Laterality Date  . IR CT HEAD LTD  05/27/2017  . IR PERCUTANEOUS ART THROMBECTOMY/INFUSION INTRACRANIAL INC DIAG ANGIO  05/27/2017  . PACEMAKER INSERTION    . RADIOLOGY WITH ANESTHESIA N/A 05/27/2017   Procedure: RADIOLOGY WITH ANESTHESIA;  Surgeon: Luanne Bras, MD;  Location: [facility];  Service: Radiology;  Laterality: N/A;    Family History  Problem Relation Age of Onset  . Emphysema Father     Social History:  reports that he quit smoking about 35 years ago. His smoking use included cigarettes. He has a 37.50 pack-year smoking history. He has never used smokeless tobacco. He reports previous alcohol use. He  reports that he does not use drugs.  Allergies: No Known Allergies  Medications: I have reviewed the patient's current medications.  Results for orders placed or performed during the hospital encounter of 03/31/18 (from the past 48 hour(s))  Comprehensive metabolic panel     Status: Abnormal   Collection Time: 03/31/18  1:03 PM  Result Value Ref Range   Sodium 140 135 - 145 mmol/L   Potassium 4.6 3.5 - 5.1 mmol/L   Chloride 108 98 - 111 mmol/L   CO2 24 22 - 32 mmol/L   Glucose, Bld 88 70 - 99 mg/dL   BUN 33 (H) 8 - 23 mg/dL   Creatinine, Ser 1.64 (H) 0.61 - 1.24 mg/dL   Calcium 9.1 8.9 - 10.3 mg/dL   Total Protein 6.7 6.5 - 8.1 g/dL   Albumin 3.1 (L) 3.5 - 5.0 g/dL   AST 25 15 - 41 U/L   ALT 18 0 - 44 U/L   Alkaline Phosphatase 165 (H) 38 - 126 U/L   Total Bilirubin 0.9 0.3 - 1.2 mg/dL   GFR calc non Af Amer 38 (L) >60 mL/min   GFR calc Af Amer 44 (L) >60 mL/min   Anion gap 8 5 - 15    Comment: Performed at [facility], [address], [place] [address]  CBC with Differential     Status: Abnormal   Collection Time: 03/31/18  1:03 PM  Result Value Ref Range   WBC 9.4  4.0 - 10.5 K/uL   RBC 4.71 4.22 - 5.81 MIL/uL   Hemoglobin 12.9 (L) 13.0 - 17.0 g/dL   HCT 42.9 39.0 - 52.0 %   MCV 91.1 80.0 - 100.0 fL   MCH 27.4 26.0 - 34.0 pg   MCHC 30.1 30.0 - 36.0 g/dL   RDW 14.6 11.5 - 15.5 %   Platelets 188 150 - 400 K/uL   nRBC 0.0 0.0 - 0.2 %   Neutrophils Relative % 72 %   Neutro Abs 6.8 1.7 - 7.7 K/uL   Lymphocytes Relative 18 %   Lymphs Abs 1.7 0.7 - 4.0 K/uL   Monocytes Relative 8 %   Monocytes Absolute 0.7 0.1 - 1.0 K/uL   Eosinophils Relative 1 %   Eosinophils Absolute 0.1 0.0 - 0.5 K/uL   Basophils Relative 1 %   Basophils Absolute 0.1 0.0 - 0.1 K/uL   Immature Granulocytes 0 %   Abs Immature Granulocytes 0.03 0.00 - 0.07 K/uL    Comment: Performed at [facility] [address], [place], [place] [address]  I-stat Chem 8, ED     Status:  Abnormal   Collection Time: 03/31/18  1:13 PM  Result Value Ref Range   Sodium 140 135 - 145 mmol/L   Potassium 4.5 3.5 - 5.1 mmol/L   Chloride 107 98 - 111 mmol/L   BUN 33 (H) 8 - 23 mg/dL   Creatinine, Ser 1.60 (H) 0.61 - 1.24 mg/dL   Glucose, Bld 86 70 - 99 mg/dL   Calcium, Ion 1.20 1.15 - 1.40 mmol/L   TCO2 26 22 - 32 mmol/L   Hemoglobin 13.3 13.0 - 17.0 g/dL   HCT 39.0 39.0 - 52.0 %  I-Stat CG4 Lactic Acid, ED     Status: None   Collection Time: 03/31/18  1:13 PM  Result Value Ref Range   Lactic Acid, Venous 1.32 0.5 - 1.9 mmol/L  Urinalysis, Routine w reflex microscopic     Status: Abnormal   Collection Time: 03/31/18  2:14 PM  Result Value Ref Range   Color, Urine YELLOW YELLOW   APPearance HAZY (A) CLEAR   Specific Gravity, Urine 1.016 1.005 - 1.030   pH 5.0 5.0 - 8.0   Glucose, UA NEGATIVE NEGATIVE mg/dL   Hgb urine dipstick NEGATIVE NEGATIVE   Bilirubin Urine NEGATIVE NEGATIVE   Ketones, ur NEGATIVE NEGATIVE mg/dL   Protein, ur NEGATIVE NEGATIVE mg/dL   Nitrite NEGATIVE NEGATIVE   Leukocytes, UA LARGE (A) NEGATIVE   RBC / HPF 6-10 0 - 5 RBC/hpf   WBC, UA >50 (H) 0 - 5 WBC/hpf   Bacteria, UA NONE SEEN NONE SEEN   Squamous Epithelial / LPF 0-5 0 - 5   Mucus PRESENT    Hyaline Casts, UA PRESENT     Comment: Performed at [facility], [place] [address], [place], [place] [address]    Ct Abdomen Pelvis Wo Contrast  Result Date: 1October 29, 202019 CLINICAL DATA:  Altered mental status and weakness beginning 3 days ago. Clinical concern for diverticulitis. EXAM: CT ABDOMEN AND PELVIS WITHOUT CONTRAST TECHNIQUE: Multidetector CT imaging of the abdomen and pelvis was performed following the standard protocol without IV contrast. COMPARISON:  02/13/2010. FINDINGS: Lower chest: Moderate size right effusion layering dependently with dependent pulmonary atelectasis. Small left effusion with dependent atelectasis. Non dependent lung appears clear. Hepatobiliary: No liver parenchymal  abnormality seen. Previous cholecystectomy. Pancreas: Chronic atrophy.  No focal or acute finding. Spleen: Normal Adrenals/Urinary Tract: No  significant adrenal finding. The right kidney is absent. The left kidney shows a few small cysts. There is fullness of the renal collecting system and ureter to the level of the bladder. No obstructing stone is seen. There is some deformity of the left side of the bladder presumably relating to the history of previous bladder cancer. Question thickening of the left bladder wall. Whereas this could relate to previous treatment, I can not rule out the possibility of a recurrent bladder mass. Stomach/Bowel: No sign of bowel obstruction. Minimal diverticulosis without evidence of diverticulitis. Vascular/Lymphatic: Aortic atherosclerosis. No aneurysm. IVC is normal. No retroperitoneal adenopathy. Reproductive: No acute or significant finding. Calcification of the seminal vesicles. Other: No free fluid or air. Musculoskeletal: Previous lumbar decompression and fusion. No acute bone finding. IMPRESSION: Pleural effusions larger on the right than the left, layering dependently with dependent atelectasis. No sign of diverticulitis as questioned clinically. Mild diverticulosis. No right kidney. Left kidney shows fullness of the renal collecting system an ureter all the way to the bladder. There is deformity of the left bladder wall with some apparent thickening. The patient has a history of bladder cancer. I certainly could not rule out recurrent disease along the left bladder side wall. There may well be an element of ureteral obstruction. Aortic atherosclerosis. Electronically Signed   By: Nelson Chimes M.D.   On: 105/30/2019 12:15   Dg Chest 1 View  Result Date: 105/30/2019 CLINICAL DATA:  Cough and weakness EXAM: CHEST  1 VIEW COMPARISON:  November 12, 2017 FINDINGS: There is patchy infiltrate in the right mid and lower lung zones. Lungs elsewhere clear. Heart is borderline enlarged  with pulmonary vascularity normal. Pacemaker leads are attached the right atrium and right ventricle. There is aortic atherosclerosis. No adenopathy. No bone lesions. IMPRESSION: Patchy infiltrate right mid and lower lung zones, most likely representing a degree of pneumonia. Lungs elsewhere clear. Stable cardiac prominence with pacemaker leads attached to right atrium and right ventricle. There is aortic atherosclerosis. Aortic Atherosclerosis (ICD10-I70.0). Electronically Signed   By: Lowella Grip III M.D.   On: 105/30/2019 12:20   Ct Head Wo Contrast  Result Date: 105/30/2019 CLINICAL DATA:  Altered mental status and generalized weakness EXAM: CT HEAD WITHOUT CONTRAST TECHNIQUE: Contiguous axial images were obtained from the base of the skull through the vertex without intravenous contrast. COMPARISON:  November 06, 2017 FINDINGS: Brain: The ventricles are normal in size and configuration. There is moderate generalized sulcal atrophy, stable. There is evidence of a prior infarct involving portions of the posterior inferior right frontal lobe, most of the right temporal lobe, as well as portions of the anterior right occipital and parietal lobes, stable. There is evidence of a prior infarct in the superior left cerebellum. There is a smaller infarct, chronic, in the superior right cerebellum. There is patchy periventricular small vessel disease. No acute infarct is demonstrable. There is no which hemorrhage, extra-axial fluid collection, or midline shift. A small meningioma in the left parietal region measures 0.9 x 0.8 cm, stable. No other evident mass. Vascular: There is no hyperdense vessel. There is calcification in each carotid siphon region. Skull: The bony calvarium appears intact. Sinuses/Orbits: There is mucosal thickening involving several ethmoid air cells. Orbits appear symmetric bilaterally. Other: Mastoid air cells are clear. IMPRESSION: 1. Evidence of prior infarcts, largest involving the right  middle cerebral artery distribution. There is periventricular small vessel disease. No acute infarct evident. 2. Small calcified meningioma left posterior parietal region, stable, without mass effect  or edema. 3.  Foci of arterial vascular calcification noted. 4.  There is mucosal thickening involving several ethmoid air cells. Electronically Signed   By: Lowella Grip III M.D.   On: 1September 03, 202019 12:12    Review of Systems  Unable to perform ROS: Mental status change   Blood pressure 108/70, pulse (!) 59, temperature 99.3 F (37.4 C), temperature source Rectal, resp. rate 20, height 5\' 10"  (1.778 m), weight 86.2 kg, SpO2 97 %. Physical Exam  Constitutional:  Ill appearing with stigmata of advanced neurologic disease. Wife at bedside in ER.   HENT:  Head: Normocephalic.  Some facial droop. Very thick mucus membrane secretions.   Eyes: Pupils are equal, round, and reactive to light.  Cardiovascular: Normal rate.  Respiratory: Effort normal.  GI: Soft.  Multiple scars w/o hernias.   Genitourinary:    Genitourinary Comments: No CVAT. Some SP / pelvic TTP.    Neurological:  AOx0. Wife provides all meaningful history  Skin: Skin is warm.    Assessment/Plan:  1 - Mild Chronic Left Hydronephrosis - stable x nearly a decade. I do NOT feel urgent inpatient evaluaiton warranted.   2 - Solitary Left Kidney / Chronic Renal Insuficiency - GFR at near baseline. Should this dramatically worsen, consdier left stenting. I suspect this will improve with hydration as he appears quite dehydrated at present.   3 - Bladder Cancer - rec cysto as outpatient to r/o recurrence, which if present, would be best managed by elective transurethral resection and left diagnostic ureteroscopy should he get back to reasonable functional status. Importance of GU f/u with private v. [facility] reiterated to wife who voiced understanding ans possibility of cancer recurrecne.   I will arrange GU follow up in outpatient  setting. Please call me directly with questions anytime.   Samil Mecham 1September 03, 202019, 4:10 PM

## 2018-03-31 NOTE — ED Notes (Signed)
Pt not appropriate for condom cath due to wound at base of penis, assisted pt to attempt to use urinal without success. Pt is wearing a brief at this time

## 2018-03-31 NOTE — ED Notes (Signed)
Pharmacy would like to be notified if any family shows up. Need an updated medication list.

## 2018-03-31 NOTE — ED Provider Notes (Signed)
[facility] EMERGENCY DEPARTMENT Provider Note   CSN: [id number] Arrival date & time: 03/31/18  1101     History   Chief Complaint Chief Complaint  Patient presents with  . Altered Mental Status  . Weakness    HPI Derek Hays is a 84 y.o. male.  Patient is a 84 year old male who presents with generalized weakness.  Patient history of atrial fibrillation, on Eliquis, hypertension, prior stroke with left-sided hemiplegia.  He states that he is bedbound but does transfer to a wheelchair at times.  He is nonambulatory.  He wears a condom cath at home.  He lives with his wife.  He reports that he has been progressively weaker for the last 3 days and said that he had a temperature at home this morning but is unable to elaborate on that.  He has a cough and feels congested in his throat.  No vomiting or diarrhea.  He has complaints of pain to his lower abdomen and he states that he has been treated for the last few days with antibiotics for a urinary tract infection.  He denies any chest pain or shortness of breath.     Past Medical History:  Diagnosis Date  . Atrial fibrillation ([place])   . Bladder cancer ([place]) 1984  . Hypertension   . Stroke [facility])     Patient Active Problem List   Diagnosis Date Noted  . Permanent atrial fibrillation   . SIRS (systemic inflammatory response syndrome) ([place]) 11/05/2017  . Urinary tract infection 09/23/2017  . Acute respiratory failure with hypoxia ([place]) 09/23/2017  . Oropharyngeal dysphagia 09/23/2017  . Palliative care by specialist   . Sepsis due to urinary tract infection ([place])   . HCAP (healthcare-associated pneumonia)   . Altered mental status 08/10/2017  . Acute blood loss anemia   . Stage 3 chronic kidney disease ([place])   . PAF (paroxysmal atrial fibrillation) ([place])   . Benign essential HTN   . Dysphagia, post-stroke   . 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 Middle cerebral artery embolism, right  05/28/2017  . Stroke (cerebrum) ([place]) 05/27/2017    Past Surgical History:  Procedure Laterality Date  . IR CT HEAD LTD  05/27/2017  . IR PERCUTANEOUS ART THROMBECTOMY/INFUSION INTRACRANIAL INC DIAG ANGIO  05/27/2017  . PACEMAKER INSERTION    . RADIOLOGY WITH ANESTHESIA N/A 05/27/2017   Procedure: RADIOLOGY WITH ANESTHESIA;  Surgeon: Luanne Bras, MD;  Location: [facility];  Service: Radiology;  Laterality: N/A;        Home Medications    Prior to Admission medications   Medication Sig Start Date End Date Taking? Authorizing Provider  pregabalin (LYRICA) 75 MG capsule Take 75 mg by mouth 2 (two) times daily.   Yes [provider]  albuterol (PROVENTIL) (2.5 MG/3ML) 0.083% nebulizer solution Take 3 mLs (2.5 mg total) by nebulization every 6 (six) hours. Patient taking differently: Take 2.5 mg by nebulization every 6 (six) hours as needed for wheezing or shortness of breath.  08/13/17   Rory Percy, DO  amLODipine (NORVASC) 10 MG tablet Take 1 tablet (10 mg total) by mouth daily. 08/14/17   Rory Percy, DO  apixaban (ELIQUIS) 5 MG TABS tablet Take 1 tablet (5 mg total) by mouth 2 (two) times daily. 06/19/17   Angiulli, Lavon Paganini, PA-C  atorvastatin (LIPITOR) 40 MG tablet Take 1 tablet (40 mg total) by mouth daily at 6 PM. 08/13/17   Rory Percy, DO  benzonatate (TESSALON) 100  MG capsule Take 200 mg by mouth 3 (three) times daily as needed for cough.    [provider]  bethanechol (URECHOLINE) 25 MG tablet Take 1 tablet (25 mg total) by mouth 3 (three) times daily. Patient taking differently: Take 25 mg by mouth 3 (three) times daily. 7:30am, 5pm, 9pm 06/19/17   Angiulli, Lavon Paganini, PA-C  carboxymethylcellulose (REFRESH TEARS) 0.5 % SOLN Place 1 drop into both eyes every 6 (six) hours as needed (dry eyes).    [provider]  latanoprost (XALATAN) 0.005 % ophthalmic solution Place 1 drop into both eyes at bedtime.    [provider]  levothyroxine  (SYNTHROID, LEVOTHROID) 125 MCG tablet Take 125 mcg by mouth daily.     [provider]  tamsulosin (FLOMAX) 0.4 MG CAPS capsule Take 1 capsule (0.4 mg total) by mouth daily after supper. Patient taking differently: Take 0.4 mg by mouth at bedtime.  06/19/17   Angiulli, Lavon Paganini, PA-C    Family History Family History  Problem Relation Age of Onset  . Emphysema Father     Social History Social History   Tobacco Use  . Smoking status: Former Smoker    Packs/day: 1.50    Years: 25.00    Pack years: 37.50    Types: Cigarettes    Last attempt to quit: 06/12/1982    Years since quitting: 35.8  . Smokeless tobacco: Never Used  . Tobacco comment: pt states he quit when dx with bladder ca  Substance Use Topics  . Alcohol use: Not Currently  . Drug use: No     Allergies   Patient has no known allergies.   Review of Systems Review of Systems  Constitutional: Positive for fatigue and fever. Negative for chills and diaphoresis.  HENT: Negative for congestion, rhinorrhea and sneezing.   Eyes: Negative.   Respiratory: Positive for cough. Negative for chest tightness and shortness of breath.   Cardiovascular: Negative for chest pain and leg swelling.  Gastrointestinal: Positive for abdominal pain. Negative for blood in stool, diarrhea, nausea and vomiting.  Genitourinary: Negative for difficulty urinating, flank pain, frequency and hematuria.  Musculoskeletal: Negative for arthralgias and back pain.  Skin: Negative for rash.  Neurological: Positive for weakness. Negative for dizziness, speech difficulty, numbness and headaches.     Physical Exam Updated Vital Signs BP 108/70   Pulse (!) 59   Temp 99.3 F (37.4 C) (Rectal)   Resp 20   Ht 5\' 10"  (1.778 m)   Wt 86.2 kg   SpO2 97%   BMI 27.26 kg/m   Physical Exam Constitutional:      Appearance: He is well-developed.  HENT:     Head: Normocephalic and atraumatic.  Eyes:     Pupils: Pupils are equal, round, and  reactive to light.  Neck:     Musculoskeletal: Normal range of motion and neck supple.  Cardiovascular:     Rate and Rhythm: Normal rate and regular rhythm.     Heart sounds: Normal heart sounds.  Pulmonary:     Effort: Pulmonary effort is normal. No respiratory distress.     Breath sounds: Normal breath sounds. No wheezing or rales.     Comments: Coarse rhonchi bilaterally which may be upper airway noises Chest:     Chest wall: No tenderness.  Abdominal:     General: Bowel sounds are normal.     Palpations: Abdomen is soft.     Tenderness: There is abdominal tenderness (Tenderness to his left lower  abdomen and suprapubic area). There is no guarding or rebound.  Genitourinary:    Comments: Patient has some skin breakdown at the base of his penis where a condom catheter was secured with Coban.  He also has some erythema to his inguinal areas which looks consistent with a yeast infection.  There is some cheesy discharge and satellite lesions Musculoskeletal: Normal range of motion.  Lymphadenopathy:     Cervical: No cervical adenopathy.  Skin:    General: Skin is warm and dry.     Findings: No rash.  Neurological:     Mental Status: He is alert.     Comments: Patient is oriented to person place and year.  He has slurred speech which he states is from his old stroke.  He has left-sided hemiplegia from a prior stroke.      ED Treatments / Results  Labs (all labs ordered are listed, but only abnormal results are displayed) Labs Reviewed  COMPREHENSIVE METABOLIC PANEL - Abnormal; Notable for the following components:      Result Value   BUN 33 (*)    Creatinine, Ser 1.64 (*)    Albumin 3.1 (*)    Alkaline Phosphatase 165 (*)    GFR calc non Af Amer 38 (*)    GFR calc Af Amer 44 (*)    All other components within normal limits  CBC WITH DIFFERENTIAL/PLATELET - Abnormal; Notable for the following components:   Hemoglobin 12.9 (*)    All other components within normal limits    URINALYSIS, ROUTINE W REFLEX MICROSCOPIC - Abnormal; Notable for the following components:   APPearance HAZY (*)    Leukocytes, UA LARGE (*)    WBC, UA >50 (*)    All other components within normal limits  I-STAT CHEM 8, ED - Abnormal; Notable for the following components:   BUN 33 (*)    Creatinine, Ser 1.60 (*)    All other components within normal limits  URINE CULTURE  CULTURE, BLOOD (ROUTINE X 2)  CULTURE, BLOOD (ROUTINE X 2)  I-STAT CG4 LACTIC ACID, ED  I-STAT CG4 LACTIC ACID, ED    EKG None  Radiology Ct Abdomen Pelvis Wo Contrast  Result Date: 1Apr 08, 202019 CLINICAL DATA:  Altered mental status and weakness beginning 3 days ago. Clinical concern for diverticulitis. EXAM: CT ABDOMEN AND PELVIS WITHOUT CONTRAST TECHNIQUE: Multidetector CT imaging of the abdomen and pelvis was performed following the standard protocol without IV contrast. COMPARISON:  02/13/2010. FINDINGS: Lower chest: Moderate size right effusion layering dependently with dependent pulmonary atelectasis. Small left effusion with dependent atelectasis. Non dependent lung appears clear. Hepatobiliary: No liver parenchymal abnormality seen. Previous cholecystectomy. Pancreas: Chronic atrophy.  No focal or acute finding. Spleen: Normal Adrenals/Urinary Tract: No significant adrenal finding. The right kidney is absent. The left kidney shows a few small cysts. There is fullness of the renal collecting system and ureter to the level of the bladder. No obstructing stone is seen. There is some deformity of the left side of the bladder presumably relating to the history of previous bladder cancer. Question thickening of the left bladder wall. Whereas this could relate to previous treatment, I can not rule out the possibility of a recurrent bladder mass. Stomach/Bowel: No sign of bowel obstruction. Minimal diverticulosis without evidence of diverticulitis. Vascular/Lymphatic: Aortic atherosclerosis. No aneurysm. IVC is normal. No  retroperitoneal adenopathy. Reproductive: No acute or significant finding. Calcification of the seminal vesicles. Other: No free fluid or air. Musculoskeletal: Previous lumbar decompression and fusion. No  acute bone finding. IMPRESSION: Pleural effusions larger on the right than the left, layering dependently with dependent atelectasis. No sign of diverticulitis as questioned clinically. Mild diverticulosis. No right kidney. Left kidney shows fullness of the renal collecting system an ureter all the way to the bladder. There is deformity of the left bladder wall with some apparent thickening. The patient has a history of bladder cancer. I certainly could not rule out recurrent disease along the left bladder side wall. There may well be an element of ureteral obstruction. Aortic atherosclerosis. Electronically Signed   By: Nelson Chimes M.D.   On: 12020-11-2817 12:15   Dg Chest 1 View  Result Date: 12020-11-2817 CLINICAL DATA:  Cough and weakness EXAM: CHEST  1 VIEW COMPARISON:  November 12, 2017 FINDINGS: There is patchy infiltrate in the right mid and lower lung zones. Lungs elsewhere clear. Heart is borderline enlarged with pulmonary vascularity normal. Pacemaker leads are attached the right atrium and right ventricle. There is aortic atherosclerosis. No adenopathy. No bone lesions. IMPRESSION: Patchy infiltrate right mid and lower lung zones, most likely representing a degree of pneumonia. Lungs elsewhere clear. Stable cardiac prominence with pacemaker leads attached to right atrium and right ventricle. There is aortic atherosclerosis. Aortic Atherosclerosis (ICD10-I70.0). Electronically Signed   By: Lowella Grip III M.D.   On: 12020-11-2817 12:20   Ct Head Wo Contrast  Result Date: 12020-11-2817 CLINICAL DATA:  Altered mental status and generalized weakness EXAM: CT HEAD WITHOUT CONTRAST TECHNIQUE: Contiguous axial images were obtained from the base of the skull through the vertex without intravenous contrast.  COMPARISON:  November 06, 2017 FINDINGS: Brain: The ventricles are normal in size and configuration. There is moderate generalized sulcal atrophy, stable. There is evidence of a prior infarct involving portions of the posterior inferior right frontal lobe, most of the right temporal lobe, as well as portions of the anterior right occipital and parietal lobes, stable. There is evidence of a prior infarct in the superior left cerebellum. There is a smaller infarct, chronic, in the superior right cerebellum. There is patchy periventricular small vessel disease. No acute infarct is demonstrable. There is no which hemorrhage, extra-axial fluid collection, or midline shift. A small meningioma in the left parietal region measures 0.9 x 0.8 cm, stable. No other evident mass. Vascular: There is no hyperdense vessel. There is calcification in each carotid siphon region. Skull: The bony calvarium appears intact. Sinuses/Orbits: There is mucosal thickening involving several ethmoid air cells. Orbits appear symmetric bilaterally. Other: Mastoid air cells are clear. IMPRESSION: 1. Evidence of prior infarcts, largest involving the right middle cerebral artery distribution. There is periventricular small vessel disease. No acute infarct evident. 2. Small calcified meningioma left posterior parietal region, stable, without mass effect or edema. 3.  Foci of arterial vascular calcification noted. 4.  There is mucosal thickening involving several ethmoid air cells. Electronically Signed   By: Lowella Grip III M.D.   On: 12020-11-2817 12:12    Procedures Procedures (including critical care time)  Medications Ordered in ED Medications  cefTRIAXone (ROCEPHIN) 1 g in sodium chloride 0.9 % 100 mL IVPB (has no administration in time range)  azithromycin (ZITHROMAX) 500 mg in sodium chloride 0.9 % 250 mL IVPB (500 mg Intravenous New Bag/Given 03/31/18 1531)     Initial Impression / Assessment and Plan / ED Course  I have reviewed  the triage vital signs and the nursing notes.  Pertinent labs & imaging results that were available during my care of the patient  were reviewed by me and considered in my medical decision making (see chart for details).     Patient is a 84 year old male who presents with generalized weakness associated with lower abdominal pain and dysuria.  His wife states that he has been very fatigued over the last few days and was recently treated for urinary tract infection through the [facility] hospital with a total of 14 days of antibiotics although she does not know what antibiotic.  His urinalysis shows a large amount of white cells but no bacteria.  It was sent for culture.  His chest x-ray shows bilateral patchy infiltrates consistent with pneumonia.  His CT scan shows a suspicious area of his bladder with wall thickening and associated hydronephrosis which could represent a partial obstruction.  His wife states that he has remote history of bladder cancer in the 90s.  He was given IV antibiotics.  I spoke with Dr. Bess Harvest with urology who did not feel that any urgent intervention was indicated but he will consult on the patient.  I spoke with the internal medicine teaching service resident who will admit the patient for unassigned medicine.  Final Clinical Impressions(s) / ED Diagnoses   Final diagnoses:  Lower urinary tract infectious disease  Community acquired pneumonia, unspecified laterality  Hydronephrosis, unspecified hydronephrosis type    ED Discharge Orders    None       Malvin Johns, MD 03/31/18 [phone]

## 2018-03-31 NOTE — ED Notes (Signed)
Went to check BP of 85/61. Admitting MD at bedside

## 2018-03-31 NOTE — ED Notes (Signed)
Unable to provide urine sample with urinal at this time

## 2018-03-31 NOTE — H&P (Signed)
Date: 103-03-202019               Patient Name:  Derek Hays MRN: [id number]  DOB: 01-Nov-1930 Age / Sex: 84 y.o., male   [facility]              Medical Service: Internal Medicine Teaching Service              Attending Physician: Dr. Lenice Pressman    First Contact: Derek Hays MS3 Pager: [phone]  Second Contact: Dr. Eileen Stanford Pager: [phone]  Third Contact Dr. Shan Levans  Pager: [phone]       After Hours (After 5p/  First Contact Pager: [phone]  weekends / holidays): Second Contact Pager: [phone]   Chief Complaint: Difficult to arouse  History of Present Illness:  Derek Hays is a 84 yo male with hx of HFpEF, Afib on eliquis, LBBB s/p pacemaker placement, hyperthyroidism s/p thyroidectomy, CKD Stage 3, Hx of bladder cancer, and congenital absent of right kidney who presents with his wife Derek Hays) when she realized he was difficulty to arouse this morning. Yesterday he was difficult to wake up but eventually woke up so his wife just thoguht he was sluggish but then this morning  His lethargy persisted which prompted her to call the home health nurse that used to help takes care of him and she recommended that they come to the ED.   During this time he has had an ongoing UTI that was being treated by the [place]. According to his wife, originally his urine studies "did not show significant signs of infection" but the [place] prescribed him a short course of antibiotics. His symptoms of dysuria persisted for which his wife states he received an extended course of antibiotics totaling between 8-10 days which was completed last week without resolution of symptoms. Otherwise denies increased frequency or urgency or hematuria.  His wife also notes that during the last week his cough has gotten worse and she now thinks that the cough is productive though unable to describe consistency. Derek Hays denies shortness of breath or other symptoms of the flu with the exception of a runny  nose yesterday.  ED course: VSS, afebrile, intermittent tachypnea and bradycardia. UA with pyuria though negative nitrites and bacteria, CXR with RLL/RML infiltrate for which he received IV ceftriaxone and azithromycin.  CT abdomen and pelvis confirmed congenital absence of right kidney, mild chronic left hydronephrosis, bladder wall thickening.  Urology was consulted in the ED for further evaluation.  Meds: Current Facility-Administered Medications  Medication Dose Route Frequency Provider Last Rate Last Dose  . cefTRIAXone (ROCEPHIN) 1 g in sodium chloride 0.9 % 100 mL IVPB  1 g Intravenous Once Malvin Johns, MD 200 mL/hr at 03/31/18 1644 1 g at 03/31/18 1644   Current Outpatient Medications  Medication Sig Dispense Refill  . pregabalin (LYRICA) 75 MG capsule Take 75 mg by mouth 2 (two) times daily.    [name] albuterol (PROVENTIL) (2.5 MG/3ML) 0.083% nebulizer solution Take 3 mLs (2.5 mg total) by nebulization every 6 (six) hours. (Patient taking differently: Take 2.5 mg by nebulization every 6 (six) hours as needed for wheezing or shortness of breath. ) 75 mL 12  . amLODipine (NORVASC) 10 MG tablet Take 1 tablet (10 mg total) by mouth daily. 90 tablet 0  . apixaban (ELIQUIS) 5 MG TABS tablet Take 1 tablet (5 mg total) by mouth 2 (two) times daily. 60 tablet   . atorvastatin (LIPITOR) 40 MG tablet Take 1 tablet (40  mg total) by mouth daily at 6 PM. 90 tablet 0  . benzonatate (TESSALON) 100 MG capsule Take 200 mg by mouth 3 (three) times daily as needed for cough.    . bethanechol (URECHOLINE) 25 MG tablet Take 1 tablet (25 mg total) by mouth 3 (three) times daily. (Patient taking differently: Take 25 mg by mouth 3 (three) times daily. 7:30am, 5pm, 9pm)    . carboxymethylcellulose (REFRESH TEARS) 0.5 % SOLN Place 1 drop into both eyes every 6 (six) hours as needed (dry eyes).    [name] latanoprost (XALATAN) 0.005 % ophthalmic solution Place 1 drop into both eyes at bedtime.    [name] levothyroxine  (SYNTHROID, LEVOTHROID) 125 MCG tablet Take 125 mcg by mouth daily.     . tamsulosin (FLOMAX) 0.4 MG CAPS capsule Take 1 capsule (0.4 mg total) by mouth daily after supper. (Patient taking differently: Take 0.4 mg by mouth at bedtime. ) 30 capsule     Allergies: Allergies as of 104/20/202019  . (No Known Allergies)   Past Medical History:  Diagnosis Date  . Atrial fibrillation ([place])   . Bladder cancer ([place]) 1984  . Hypertension   . Stroke [facility])    Past Surgical History:  Procedure Laterality Date  . IR CT HEAD LTD  05/27/2017  . IR PERCUTANEOUS ART THROMBECTOMY/INFUSION INTRACRANIAL INC DIAG ANGIO  05/27/2017  . PACEMAKER INSERTION    . RADIOLOGY WITH ANESTHESIA N/A 05/27/2017   Procedure: RADIOLOGY WITH ANESTHESIA;  Surgeon: Luanne Bras, MD;  Location: [facility];  Service: Radiology;  Laterality: N/A;   Family History  Problem Relation Age of Onset  . Emphysema Father    Social history: Derek Hays is a veteran and is married to his wife Derek Hays.  He served in the [employer].  He has a 37-pack-year smoking history though he currently denies cigarette use, EtOH or illicit drug use.  Review of Systems: Pertinent items are noted in HPI.  Physical Exam: Blood pressure 108/70, pulse (!) 59, temperature 99.3 F (37.4 C), temperature source Rectal, resp. rate 20, height 5\' 10"  (1.778 m), weight 86.2 kg, SpO2 97 %. General appearance: alert, no distress and slowed mentation Head: Normocephalic, without obvious abnormality, atraumatic Lungs: diminished breath sounds bilaterally and crackles noted over RLL Heart: heart sounds difficult to hear Abdomen: TTP suprapubic location, otherwise soft and nontender Extremities: no edema, redness or tenderness in the calves or thighs, venous stasis dermatitis noted and non-blanching petichiae noted in bilateral shins Pulses: 2+ and symmetric Skin: non-blanching petichiae noted bilaterally on shins Neurologic: Mental status: alertness: alert,  orientation: person, place, affect: normal  Lab results:  Lab Orders     Urine culture     Culture, blood (routine x 2)     Comprehensive metabolic panel     CBC with Differential     Urinalysis, Routine w reflex microscopic     Basic metabolic panel     I-stat Chem 8, ED     I-Stat CG4 Lactic Acid, ED    Assessment & Plan by Problem: Active Problems:   Pneumonia -Derek Hays presents with increasing cough, and sputum production. Chest xray shows patchy infiltrate in the right mid and lower lung zones suggesting pneumonia. Also on the differential is influenza but he does not display any physical symptoms of flu and has had no sick contacts recently.  He is currently on 2 L O2 but unclear if he will desaturate without out oxygen requirement.  Given his history of stroke and left  hemiplegia, he is also at increased risk for aspiration pneumonia so if he develops fever then will consider adding Zosyn for anaerobic coverage.  -s/p IV ceftriaxone and Azithromax -Nasal cannula O2 -Elevation of head of bed to prevent aspiration  Dysuria r/o UTI: He presents with burning with urination despite a 10-day course of oral antibiotic.  Urinalysis shows pyuria without nitrites or bacteria. Findings of UA could be due to bladder wall thickening noted on CT but given that he is symptomatic, will get urine culture to r/o infection and tailor antibiotics accordingly.  -Follow-up urine culture  Altered Mental Status r/o sepsis: He presents with lethargy which could be secondary to ongoing pneumonia versus urinary tract infection.  On exams he was alert and oriented to name and place.  Though given his age will have to rule out metabolic, infectious, structural causes of his lethargy.  Also, polypharmacy could be another source though is not on any centrally acting meds. -Follow-up blood culture, BMP  Hx of CVA with residual Left sided weakness -He has hx of stroke back  In Feb 2019. CT head showed  stable prior infarcts with no signs of acute disease. -will cont to monitor for signs of worsening weakness or AMS    HFpEF: He currently has no signs of heart failure exacerbation. Bilateral pleural effusions noted on imaging however no signs otherwise of HF exacerbation chest pitting edema or JVD.   -will cont to monitor for sings of fluid overload upon fluid resuscitation  Hx of HTN: -BP stable.  We will continue to monitor  Hx of Bladder Cancer Pt with hx of bladder cancer found to have hydronephrosis and thickening of bladder wall questionable for recurrent bladder cancer on CT today.  -Appreciate urology recs  Hx of Left bundle Branch Block: Pt with hx of left bundle branch block s/p ventricular pacemaker placement.  Also on Eliquis. -cont eliquis  Paroxysmal Afib: Hx of afib s/p ventricular pace maker placement. He is currently being rate controlled via pacemaker. -rate controlled -cont eliquis  HLD: -will cont home lipitor.   This is a Careers information officer Note.  The care of the patient was discussed with Dr. Eileen Stanford and the assessment and plan was formulated with their assistance.  Please see their note for official documentation of the patient encounter.   Signed: Synetta Shadow, Medical Student 111-Sep-202019, 4:51 PM    Attestation for Student Documentation:  I personally was present and performed or re-performed the history, physical exam and medical decision-making activities of this service and have verified that the service and findings are accurately documented in the student's note.  Please see below for my updated assessment and plan:  Derek Hays is a 84 year old pleasant gentleman with atrial fibrillation on Eliquis, hypertension, history of right MCA with resultant left-sided hemiplegia 05/2017, chronic subdural hematoma, CKD stage III, HFpEF 60-65%, bladder cancer in 1984 here for management of right lower/middle lobe pneumonia.  Right lower lobe/middle lobe  pneumonia: Aspiration vs community-acquired pneumonia. Presents with nonproductive cough with onset few weeks ago.  Remains afebrile and without shortness of breath.  CBC without leukocytosis, lactic acid unremarkable.  Chest x-ray shows patchy infiltrate of the right middle and lower lung lobes. On physical exam, noticeable crackles more predominant at the right lower quadrant.  He is at risk for aspiration pneumonia given his history of stroke.  SPO2 reassuring at 100% on 2 L nasal cannula.  -s/p IV ceftriaxone and azithromycin in ED -If no clinical improvement, can broaden antibiotic  to cover for aspiration pneumonia -Continue supplemental O2 at 2 L [place] w/ wean to RA.  -Aspiration precaution -Follow-up CBC a.m., blood culture  Dysuria secondary to UTI vs bladder irritation: Reports of dysuria since November which has apparently been treated by the [facility] and has completed an 8 to 10-day course of antibiotics.  Dysuria still ongoing UA with pyuria though negative nitrites and bacteria. -Follow-up urine culture  Left bladder wall thickening: Derek Hays was found to have left bladder wall thickening on CT abdomen and pelvis.  Though he denies hematuria or passage of blood clots, given his history of bladder cancer in the 1984, urology consult is warranted.  He underwent transurethral resection for this in 1984. [facility] -Please appreciate urology recs: Outpatient cystoscopy to rule out recurrence of bladder cancer.  Congenital absent right kidney Chronic left hydronephrosis ?Prerenal azotemia CKD stage III Elevated BUN/creatinine 33/1.64 (Baseline Cr ~1.2-1.3).  His wife reported of decreased appetite and oral intake in the past 2 weeks -Follow-up a.m. BMP -IV fluids with NS at 50 mL/hour -Bladder scan q8 hrs  History of left bundle branch block: He has a ventricular pacemaker placement.  Heart rate with range 57-60  bpm -Continue to monitor  Hypertension: BP stable -Home antihypertensives include amlodipine 10 mg daily,  History of right MCA: Known history of large right MCA stroke with left-sided hemiplegia. -Continue Crestor  History of HFpEF: No sign of acute heart failure exacerbation.  Atrial fibrillation: Rate controlled with pacemaker. -Continue Eliquis 2.5 mg BID  Hypothyroidism: Continue Synthroid 125 mcg daily  FEN: Replace electrolytes as needed, IVF with NS @50mL /hr VTE prophylaxis: Eliquis CODE STATUS: Full code   Derek Rosenthal, MD 112-08-202019, 7:02 PM

## 2018-03-31 NOTE — ED Triage Notes (Addendum)
Pt from home with AMS and weakness starting 3 days ago and progressively worsening. Brought in by [facility], from home but unsure if alone or not. No temp available by [facility]  100/70 84 hr resp 23 100 % ra

## 2018-03-31 NOTE — ED Notes (Signed)
Patient transported to CT 

## 2018-04-01 DIAGNOSIS — R7989 Other specified abnormal findings of blood chemistry: Secondary | ICD-10-CM

## 2018-04-01 DIAGNOSIS — I130 Hypertensive heart and chronic kidney disease with heart failure and stage 1 through stage 4 chronic kidney disease, or unspecified chronic kidney disease: Secondary | ICD-10-CM

## 2018-04-01 DIAGNOSIS — I872 Venous insufficiency (chronic) (peripheral): Secondary | ICD-10-CM

## 2018-04-01 DIAGNOSIS — J181 Lobar pneumonia, unspecified organism: Secondary | ICD-10-CM

## 2018-04-01 DIAGNOSIS — N183 Chronic kidney disease, stage 3 (moderate): Secondary | ICD-10-CM

## 2018-04-01 DIAGNOSIS — Z79899 Other long term (current) drug therapy: Secondary | ICD-10-CM

## 2018-04-01 DIAGNOSIS — I69354 Hemiplegia and hemiparesis following cerebral infarction affecting left non-dominant side: Secondary | ICD-10-CM

## 2018-04-01 DIAGNOSIS — Z87440 Personal history of urinary (tract) infections: Secondary | ICD-10-CM

## 2018-04-01 DIAGNOSIS — N1330 Unspecified hydronephrosis: Secondary | ICD-10-CM

## 2018-04-01 DIAGNOSIS — I5030 Unspecified diastolic (congestive) heart failure: Secondary | ICD-10-CM

## 2018-04-01 DIAGNOSIS — Z8679 Personal history of other diseases of the circulatory system: Secondary | ICD-10-CM

## 2018-04-01 DIAGNOSIS — Z950 Presence of cardiac pacemaker: Secondary | ICD-10-CM

## 2018-04-01 DIAGNOSIS — Z7901 Long term (current) use of anticoagulants: Secondary | ICD-10-CM

## 2018-04-01 DIAGNOSIS — G629 Polyneuropathy, unspecified: Secondary | ICD-10-CM

## 2018-04-01 LAB — CBC
HCT: 39.4 % (ref 39.0–52.0)
Hemoglobin: 12.5 g/dL — ABNORMAL LOW (ref 13.0–17.0)
MCH: 28.2 pg (ref 26.0–34.0)
MCHC: 31.7 g/dL (ref 30.0–36.0)
MCV: 88.7 fL (ref 80.0–100.0)
Platelets: 141 10*3/uL — ABNORMAL LOW (ref 150–400)
RBC: 4.44 MIL/uL (ref 4.22–5.81)
RDW: 14.4 % (ref 11.5–15.5)
WBC: 7.3 10*3/uL (ref 4.0–10.5)
nRBC: 0 % (ref 0.0–0.2)

## 2018-04-01 LAB — URINE CULTURE

## 2018-04-01 LAB — BASIC METABOLIC PANEL
Anion gap: 10 (ref 5–15)
BUN: 33 mg/dL — ABNORMAL HIGH (ref 8–23)
CO2: 24 mmol/L (ref 22–32)
Calcium: 9.1 mg/dL (ref 8.9–10.3)
Chloride: 107 mmol/L (ref 98–111)
Creatinine, Ser: 1.4 mg/dL — ABNORMAL HIGH (ref 0.61–1.24)
GFR calc Af Amer: 53 mL/min — ABNORMAL LOW (ref >60)
GFR calc non Af Amer: 45 mL/min — ABNORMAL LOW (ref >60)
Glucose, Bld: 86 mg/dL (ref 70–99)
Potassium: 4.4 mmol/L (ref 3.5–5.1)
Sodium: 141 mmol/L (ref 135–145)

## 2018-04-01 MED ORDER — PROMETHAZINE HCL 25 MG PO TABS
25.0000 mg | ORAL_TABLET | Freq: Four times a day (QID) | ORAL | Status: DC | PRN
  Administered 2018-04-01 – 2018-04-02 (×2): 25 mg via ORAL
  Filled 2018-04-01 (×2): qty 1

## 2018-04-01 MED ORDER — TAMSULOSIN HCL 0.4 MG PO CAPS
0.4000 mg | ORAL_CAPSULE | Freq: Every day | ORAL | Status: DC
  Administered 2018-04-01 – 2018-04-03 (×3): 0.4 mg via ORAL
  Filled 2018-04-01 (×3): qty 1

## 2018-04-01 MED ORDER — SODIUM CHLORIDE 0.9 % IV BOLUS
500.0000 mL | Freq: Once | INTRAVENOUS | Status: AC
  Administered 2018-04-01: 500 mL via INTRAVENOUS

## 2018-04-01 NOTE — Progress Notes (Signed)
Rehab Admissions Coordinator Note:  Patient was screened by Cleatrice Burke for appropriateness for an Inpatient Acute Rehab Consult per PT recs. OT recommends no OT follow up. Patient previously at [facility] 05/30/17 until 3/7 2019. Discharged to SNF[facility] and then discharged home. Use of Hoyer lift to wheelchair. Readmitted to hospital and returned to SNF with eventual d/c home with wife. Dr. Letta Pate sees him in the office.  At this time, we are recommending [facility]. Pt not at a level for the intensity of rehab at this time.  Danne Baxter, RN, MSN Rehab Admissions Coordinator [phone] 04/01/2018 1:34 PM

## 2018-04-01 NOTE — Progress Notes (Signed)
  Date: 04/01/2018  Patient name: Derek Hays  Medical record number: [id number]  Date of birth: 1930-12-01   I have seen and evaluated this patient and I have discussed the plan of care with the house staff. Please see their note for complete details. I concur with their findings with the following additions/corrections:  Please see my separate attestation on the H&P from 108/02/2018.  Lenice Pressman, M.D., Ph.D. 04/01/2018, 3:37 PM

## 2018-04-01 NOTE — Evaluation (Signed)
Occupational Therapy Evaluation Patient Details Name: Derek Hays MRN: [id number] DOB: 1930-09-30 Today's Date: 04/01/2018    History of Present Illness Pt is a 84 y/o R HD male w/ PMH: CVA Feb 2019 with L hemiplegia, A-fib, HTN, pacemaker, CKD III, UTI, AMS, Aspiration precautions. Pt reports that he is overall bed bound since Feb 2019, does not stand to transfer, has assistance via [place] ocassionally to use lift at home to transfer to w/c. Pt was admitted to [facility] w/ Dx: pneumonia   Clinical Impression   Pt admitted as above whom appears to be at or near baseline level for ADL/self care tasks. He does present with generalized weakness (See OT problem list below) and may benefit from trial of acute OT to assist with sitting balance in preparation for increased participation w/ basic ADL's/grooming. Use lift for all transfer for safety.     Follow Up Recommendations  Supervision/Assistance - 24 hour;No OT follow up    Equipment Recommendations  None recommended by OT(Chart review indicates that pt has DME, cont to assess in functional context for any additional needs.)    Recommendations for Other Services       Precautions / Restrictions Precautions Precautions: Fall;Other (comment)(Completely flaccid L side UB/LB) Precaution Comments: Aspiration Restrictions Other Position/Activity Restrictions: Pt reports that he is overall bed bound, sometimes transfers to w/c via lift when he has [facility] from [facility] per pt report. No family present to confirm this.      Mobility Bed Mobility Overal bed mobility: Needs Assistance Bed Mobility: Rolling;Sidelying to Sit;Supine to Sit Rolling: Max assist;Total assist Sidelying to sit: Total assist;+2 for physical assistance Supine to sit: Max assist;HOB elevated;Total assist        Transfers   Equipment used: (Lift)             General transfer comment: Pt uses [employer] fast streamer at home for all transfers from bed to w/c     Balance Overall balance assessment: Needs assistance Sitting-balance support: Single extremity supported;Feet supported Sitting balance-Leahy Scale: Poor Sitting balance - Comments: Pt sitting EOB with Max-total A x 54min. Maximal VC's and TC's required, posterior lean at times, decreased activity tolerance. HOB elevated as pt was up in sitting to assist therapist with positioning.                                    ADL either performed or assessed with clinical judgement   ADL Overall ADL's : Needs assistance/impaired   Eating/Feeding Details (indicate cue type and reason): Pt waiting on speech Assessment - Chart review indicates Aspiration precautions - NT Grooming: Wash/dry hands;Wash/dry face;Bed level;Set up;Min guard Grooming Details (indicate cue type and reason): HOB elevated, 1 handed technique Upper Body Bathing: Maximal assistance;Bed level   Lower Body Bathing: Bed level;Total assistance   Upper Body Dressing : Bed level;Maximal assistance;Total assistance   Lower Body Dressing: Total assistance;+2 for physical assistance;Bed level       Toileting- Clothing Manipulation and Hygiene: Total assistance;+2 for physical assistance;Bed level Toileting - Clothing Manipulation Details (indicate cue type and reason): Pt with catheter. Total assist, No family present to confirm home routine.   Tub/Shower Transfer Details (indicate cue type and reason): Per chart review, pt has shower however pt states that he takes bed baths at home. No family present to confirm.  Functional mobility during ADLs: (Pt does not ambulate, Use Hoyer lift for all transfers secondary  to L sided flaccid since CVA Feb 2019) General ADL Comments: Pt appears to be at or near baseline level, hoever no family weas present to confim pt history reports. Pt may benefit from trial of acute OT for sitting balance and grooming goals while in hospital setting and attempt to get further clarification from  family members when present.     Vision Baseline Vision/History: Wears glasses Patient Visual Report: Other (comment)(Pt reports wearing "glasses until they got all scratched up." Denies recent changes)       Perception     Praxis      Pertinent Vitals/Pain Pain Assessment: No/denies pain     Hand Dominance Right   Extremity/Trunk Assessment Upper Extremity Assessment Upper Extremity Assessment: LUE deficits/detail LUE Deficits / Details: Flaccid LUE since CVA Feb 2019 per pt report, No A/ROM. Impaired sensation, proprioception since CVA.   Lower Extremity Assessment Lower Extremity Assessment: Defer to PT evaluation;LLE deficits/detail LLE Deficits / Details: Flaccid LLE since CVA Feb 2019. PT to further assess       Communication Communication Communication: HOH   Cognition Arousal/Alertness: Awake/alert Behavior During Therapy: WFL for tasks assessed/performed Overall Cognitive Status: No family/caregiver present to determine baseline cognitive functioning                                 General Comments: Appears WFL's/near baseline however no family present to confirm   General Comments  Pt states that he does not stand or transfer since Feb 2019. He reports that he uses Hoyer lift for all transfers but states that he is often bed bound - no family present to confirm this.     Exercises     Shoulder Instructions      Home Living Family/patient expects to be discharged to:: Private residence(Per pt report - No family present to confirm) Living Arrangements: Spouse/significant other Available Help at Discharge: Family;Personal care attendant;Available 24 hours/day Type of Home: House Home Access: Stairs to enter;Ramped entrance     Home Layout: One level     Bathroom Shower/Tub: Walk-in shower(Pt reports that he gets bed baths, does not use shower)   Bathroom Toilet: Standard     Home Equipment: Bedside commode;Grab bars - tub/shower;Grab  bars - toilet;Walker - 2 wheels;Cane - single point;Crutches;Wheelchair - Education administrator (comment)   Additional Comments: [employer] fast streamer      Prior Functioning/Environment Level of Independence: Needs assistance  Gait / Transfers Assistance Needed: Per pt report, he has [employer] fast streamer at home and does not stand or perform transfers any other way since Feb 2019. No family present to confirm but pt states he is otherwise bed bound, sometimes gets from bed to w/c via lift "with therapy" ??Via personal care attendant but pt was unable to confirm. ADL's / Homemaking Assistance Needed: max to total assist    Comments: Pt states that he does not stand, only uses lift        OT Problem List: Decreased strength;Decreased activity tolerance;Cardiopulmonary status limiting activity;Impaired tone;Impaired UE functional use;Impaired sensation;Impaired balance (sitting and/or standing)      OT Treatment/Interventions: Self-care/ADL training;Balance training;Therapeutic activities;Patient/family education    OT Goals(Current goals can be found in the care plan section) Acute Rehab OT Goals Patient Stated Goal: Unsure OT Goal Formulation: Patient unable to participate in goal setting Time For Goal Achievement: 04/15/18 Potential to Achieve Goals: Fair  OT Frequency: Min 2X/week   Barriers to D/C:  Co-evaluation              AM-PAC OT "6 Clicks" Daily Activity     Outcome Measure Help from another person eating meals?: A Lot Help from another person taking care of personal grooming?: A Lot Help from another person toileting, which includes using toliet, bedpan, or urinal?: Total Help from another person bathing (including washing, rinsing, drying)?: Total Help from another person to put on and taking off regular upper body clothing?: A Lot Help from another person to put on and taking off regular lower body clothing?: Total 6 Click Score: 9   End of Session Equipment Utilized During  Treatment: Oxygen;Gait belt Nurse Communication: Mobility status;Precautions;Need for lift equipment  Activity Tolerance: Patient tolerated treatment well Patient left: in bed;with call bell/phone within reach;with bed alarm set  OT Visit Diagnosis: Muscle weakness (generalized) (M62.81);Hemiplegia and hemiparesis;Other abnormalities of gait and mobility (R26.89)(PMH CVA compounded by recent Pneumonia) Hemiplegia - Right/Left: Left Hemiplegia - dominant/non-dominant: Non-Dominant Hemiplegia - caused by: Cerebral infarction(CVA Feb 2019)                Time: 7673-4193 OT Time Calculation (min): 31 min Charges:  OT General Charges $OT Visit: 1 Visit OT Evaluation $OT Eval Moderate Complexity: 1 Mod OT Treatments $Therapeutic Activity: 8-22 mins   Overton Boggus Beth Dixon, OTR/L 04/01/2018, 9:06 AM

## 2018-04-01 NOTE — Progress Notes (Addendum)
Subjective: HD#1  No acute events overnight. Pt states that he is feeling great this morning. He c/o of pain in his feet from the neuropathy but denies any abdominal pain, burning with urination, or trouble breathing. He states that he has been having trouble with frequent UTIs recently and has received oral abx that have not helped at all and he prefers to receive IV abx. He also denies any fevers or chills.   Objective: Vital signs in last 24 hours: Vitals:   03/31/18 2030 03/31/18 2100 03/31/18 2219 04/01/18 0823  BP: 121/72 127/69 113/76 (!) 141/92  Pulse: 60 (!) 59  60  Resp: 15 16 (!) 22 20  Temp:   98.1 F (36.7 C) 98.1 F (36.7 C)  TempSrc:   Oral Oral  SpO2: 97% 99% 100% 99%  Weight:      Height:       Weight change:   Intake/Output Summary (Last 24 hours) at 04/01/2018 1128 Last data filed at 04/01/2018 0600 Gross per 24 hour  Intake 350 ml  Output 250 ml  Net 100 ml   General appearance: alert, cooperative and no distress Head: Normocephalic, without obvious abnormality, atraumatic Lungs: no retractions, diminished breath sounds bilaterally and crackles noted over RLL Heart: heart sounds distant Extremities: venous stasis dermatitis noted and no pretibial pitting edema Pulses: 2+ and symmetric Neurologic: Mental status: Alert, oriented, thought content appropriate Motor: persistent leftsided hemiplegia, right sided strength intact   Medications: I have reviewed the patient's current medications. Scheduled Meds: . apixaban  2.5 mg Oral BID  . atorvastatin  40 mg Oral q1800  . latanoprost  1 drop Both Eyes QHS  . levothyroxine  125 mcg Oral Q0600   Continuous Infusions: . sodium chloride 50 mL/hr at 03/31/18 2033  . azithromycin    . cefTRIAXone (ROCEPHIN)  IV    . sodium chloride      Assessment/Plan: Active Problems:   Pneumonia -In setting of clinical presentation, infiltrates in R Mid/lower lobe on CXR, increasing oxygen demand all indicative of  most likely community acquired pneumonia but can't rule out aspiration pneumonia. Now s/p ceftriaxone and azithromycin in ED and altered mental status seems to be improving. Given clinical resolution of AMS, will continue IV ceftriaxone for coverage of CAP. He remains afebrile and CBC without signs of leukocytosis which is reassuring. No major concerns for exposure to atypical bacteria such as legionella or mycoplasma but may consider altering abx regimen if he becomes febrile.  -IV Ceftriaxone 1 g q24 (Day 2) -IV azithromycin 250 mg   - Incentive spirometry -wean O2 to RA as tolerated  Dysuria: -Pt reports no burning with urination today but with chronic hydronephrosis, he is at risk of repeated UTIs as well as increased risk of pyelonephritis from ascending infection. Prior records from the [place] showed 100 WBCs and few bacteria from a UA on 03/29/18. No culture results are present in records obtained from [place]. Pt was subsequently started on Bactrim without resolution of symptoms. UA on 03/31/18 showed improvement of pyuria with no bacteria so urine studies seem to be improving. Pyuria could still be due to bladder irritation from cancer recurrence but given his increased susceptibility to infection will treat for UTI. Current Ceftriaxone for pneumonia should cover Ecoli but will wait for our urine culture results.  -IV ceftriaxone 1 g q24 - follow-up urine culture  CKD stage 3/Mild Chronic Hydronephrosis/Prerenal Azotemia -Pt making good urine but mild concern for urinary retention though bladder scan showed 160  ccs of urine this AM. This might noe be an accurate measure of urinary retention and will follow up with a PVR. Cr improved today from 1.6 on admission to 1.4 this AM s/p fluid resuscitation so likely volume depletion was playing a role in elevation. With his hx of HFpEF, we dont want to push fluids too quickly for concern for acute HF exacerbation but will initiate IVF bolus with 500 cc NS and  run IV fluids at half maintenance rate to increase intravascular fluid status. -Post void bladder scan to r/o retention/obstructive cause  -Foley catheter if signs of retaining urine on post void bladder scan -Intermittent NS bolus -IVF NS at 50 ml/hr  Altered Mental Status: AMS on admission resolved, as family states that he is back to baseline today. Most likely cause of AMS was underlying infection. BMP wnl ruling out metabolic causes, and stable CT r/o stroke as cause of AMS.   Thickened Bladder wall: Urology consulted and they recommend f/u outpatient for cystoscopy. Wife made aware of possible recurrence of cancer and agrees to f/u outpatient.   HFpEF: No signs of acute exacerbation. Will cont to monitor of volume status throughout fluid resuscitation given his increased risk for fluid overload. Replenishing fluid slowly as to not fluid overload.   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 Will cont to monitor weakness and AMS but unlikely contributing to presentation of AMS given resolution starting abx. PT/OT recommended CIR however received 1 month of of inpatient rehab after he suffered from the stroke in 05/2017. Per evaluation by Inpatient Rehab coordinator, hewill benefit from SNF placement -cont Crestor  -CSW to follow for SNF placement   Hx HTN: BP stable, will cont to monitor  Hx of Heart Block S/p ventricular pacemaker placement. Rate controlled and stable at 60-65 bpm. Per wife, he has been bradycardic in the past with HR in 20s.  -cont to monitor   Hx Afib -rate controlled via pacemaker, cont eliquis 2.5 mg BID  FENGI: Replace electrolytes as needed, IVF @50mL /hr, reg diet; cleared by SLP not at increased risk of aspiration VTE Prophylaxis: Eliquis CODE STATUS: Full code  Dispo: Require SNF placement, will place social work consult to help make arrangements on d/c    This is a Careers information officer Note.   The care of the patient was discussed with Dr. Rebeca Alert and the assessment and plan formulated with their assistance.  Please see their attached note for official documentation of the daily encounter.   LOS: 1 day   Synetta Shadow, Medical Student 04/01/2018, 8:31 AM   Attestation for Student Documentation:  I personally was present and performed or re-performed the history, physical exam and medical decision-making activities of this service and have verified that the service and findings are accurately documented in the student's note.  Jean Rosenthal, MD 04/01/2018, 2:26 PM

## 2018-04-01 NOTE — Progress Notes (Signed)
Night Team progress note: We saw and evaluated patient at bedside for reported bilious vomiting by RN.  Patient is doing well. There is yellow stain on the towel on his chest. He does not confirm vomiting or nausea but says had some reflux and bitter taste. There is a cup with yellow drink in it next to him.  He reports drinking cold water this evening and says that it usually bothers his stomach. Had a normal bowel movement and hour ago.   On exam: BP (!) 147/98 (BP Location: Left Arm)   Pulse 74   Temp 98 F (36.7 C) (Oral)   Resp 20   Ht 5\' 10"  (1.778 m)   Wt 86.2 kg   SpO2 98%   BMI 27.26 kg/m  Vitals stable. No recent  fever or hypothermia documented.  BS is present and normal. Abdomen is soft and non tender to palpation.  Not evidence of obstruction.  seems to be regurgitation of yellow fluid he drank and not bilious vomiting.  -He got PO phenergan  -Continue monitoring

## 2018-04-01 NOTE — Discharge Instructions (Signed)

## 2018-04-01 NOTE — Evaluation (Signed)
Physical Therapy Evaluation Patient Details Name: TECUMSEH YEAGLEY MRN: [id number] DOB: 1930/07/14 Today's Date: 04/01/2018   History of Present Illness  Pt is a 84 y/o R HD male w/ PMH: CVA Feb 2019 with L hemiplegia, A-fib, HTN, pacemaker, CKD III, UTI, AMS, Aspiration precautions. Pt reports that he is overall bed bound since Feb 2019, does not stand to transfer, has assistance via [place] ocassionally to use lift at home to transfer to w/c. Pt was admitted to [facility] w/ Dx: pneumonia  Clinical Impression  Pt is demonstrating a reduction in control of sitting balance today as compared to recent home care levels, unable to hold sitting balance to use LE's.  Pt is also contracted to place L hip into neutral WB posture, hindering his ability to stabilize and control balance.  His plan is to progress with mobility and sitting balance to get him back to recent PLOF for home, which will allow him to self propel wc for some independent function.  Follow acutely to increase his strength and balance, and will await decision for his rehab.  Will default to HHPT potentially so monitor his status with rehab admission to increase frequency if needed for acute PT.    Follow Up Recommendations CIR    Equipment Recommendations  None recommended by PT    Recommendations for Other Services Rehab consult     Precautions / Restrictions Precautions Precautions: Fall(L hemiparesis with no active control of L lower hand and arm) Restrictions Weight Bearing Restrictions: No Other Position/Activity Restrictions: hoyer transfer to [facility] per family      Mobility  Bed Mobility Overal bed mobility: Needs Assistance Bed Mobility: Supine to Sit;Sit to Supine Rolling: Max assist Sidelying to sit: Total assist Supine to sit: Max assist Sit to supine: Max assist   General bed mobility comments: has help of caregiver to scoot up in bed  Transfers Overall transfer level: Needs assistance                General transfer comment: hoyer transfer, not performed with PT  Ambulation/Gait             General Gait Details: not ambulatory at baseline  Stairs            Wheelchair Mobility    Modified Rankin (Stroke Patients Only) Modified Rankin (Stroke Patients Only) Pre-Morbid Rankin Score: Moderately severe disability Modified Rankin: Severe disability     Balance Overall balance assessment: Needs assistance Sitting-balance support: Feet supported;Bilateral upper extremity supported Sitting balance-Leahy Scale: Poor Sitting balance - Comments: fair for a time then shifts his balance                                     Pertinent Vitals/Pain Pain Assessment: No/denies pain    Home Living Family/patient expects to be discharged to:: Inpatient rehab Living Arrangements: Spouse/significant other Available Help at Discharge: Family;Personal care attendant;Available 24 hours/day Type of Home: House Home Access: Ramped entrance     Home Layout: One level Home Equipment: Bedside commode;Grab bars - tub/shower;Grab bars - toilet;Walker - 2 wheels;Cane - single point;Crutches;Wheelchair - Education administrator (comment) Additional Comments: [employer] fast streamer    Prior Function Level of Independence: Needs assistance   Gait / Transfers Assistance Needed: non ambulatory but rolls wc with his RLE  ADL's / Homemaking Assistance Needed: total care for dressing and bathing  Comments: has used standing lift in rehab recently  Hand Dominance   Dominant Hand: Right    Extremity/Trunk Assessment   Upper Extremity Assessment Upper Extremity Assessment: Defer to OT evaluation    Lower Extremity Assessment Lower Extremity Assessment: LLE deficits/detail LLE Deficits / Details: LLE has no active use with knee and ankle but hip has mm tone LLE Coordination: decreased fine motor;decreased gross motor    Cervical / Trunk Assessment Cervical / Trunk Assessment:  Kyphotic(mild)  Communication   Communication: HOH  Cognition Arousal/Alertness: Awake/alert Behavior During Therapy: WFL for tasks assessed/performed Overall Cognitive Status: History of cognitive impairments - at baseline                                        General Comments General comments (skin integrity, edema, etc.): pt has been to CIR after Feb 2019 stroke then HHPT until last week    Exercises     Assessment/Plan    PT Assessment Patient needs continued PT services  PT Problem List Decreased strength;Decreased range of motion;Decreased activity tolerance;Decreased balance;Decreased mobility;Decreased coordination;Decreased knowledge of use of DME;Decreased safety awareness;Decreased cognition;Cardiopulmonary status limiting activity;Impaired tone       PT Treatment Interventions DME instruction;Functional mobility training;Therapeutic activities;Therapeutic exercise;Balance training;Neuromuscular re-education;Patient/family education    PT Goals (Current goals can be found in the Care Plan section)  Acute Rehab PT Goals Patient Stated Goal: to try sitting up PT Goal Formulation: With patient/family Time For Goal Achievement: 04/15/18 Potential to Achieve Goals: Fair    Frequency Min 2X/week   Barriers to discharge Other (comment)(has accessible home but requires more assist to sit up) poor sitting control     Co-evaluation               AM-PAC PT "6 Clicks" Mobility  Outcome Measure Help needed turning from your back to your side while in a flat bed without using bedrails?: A Lot Help needed moving from lying on your back to sitting on the side of a flat bed without using bedrails?: A Lot Help needed moving to and from a bed to a chair (including a wheelchair)?: Total Help needed standing up from a chair using your arms (e.g., wheelchair or bedside chair)?: Total Help needed to walk in hospital room?: Total Help needed climbing 3-5 steps  with a railing? : Total 6 Click Score: 8    End of Session Equipment Utilized During Treatment: Oxygen Activity Tolerance: Patient limited by fatigue;Treatment limited secondary to medical complications (Comment) Patient left: in bed;with call bell/phone within reach;with bed alarm set;with nursing/sitter in room;with family/visitor present Nurse Communication: Mobility status;Other (comment)(O2 sats were 100% on cannula with all activity) PT Visit Diagnosis: Muscle weakness (generalized) (M62.81);Hemiplegia and hemiparesis;Other abnormalities of gait and mobility (R26.89) Hemiplegia - Right/Left: Left Hemiplegia - dominant/non-dominant: Non-dominant Hemiplegia - caused by: Unspecified    Time: 6333-5456 PT Time Calculation (min) (ACUTE ONLY): 32 min   Charges:   PT Evaluation $PT Eval Moderate Complexity: 1 Mod PT Treatments $Therapeutic Activity: 8-22 mins       Ramond Dial 04/01/2018, 1:17 PM   Mee Hives, PT MS Acute Rehab Dept. Number: [place]

## 2018-04-01 NOTE — Evaluation (Signed)
Clinical/Bedside Swallow Evaluation Patient Details  Name: Derek Hays MRN: [id number] Date of Birth: Mar 14, 1931  Today's Date: 04/01/2018 Time: SLP Start Time (ACUTE ONLY): 1034 SLP Stop Time (ACUTE ONLY): 1100 SLP Time Calculation (min) (ACUTE ONLY): 26 min  Past Medical History:  Past Medical History:  Diagnosis Date  . Atrial fibrillation ([place])   . Bladder cancer ([place]) 1984  . Hypertension   . Stroke [facility])    Past Surgical History:  Past Surgical History:  Procedure Laterality Date  . IR CT HEAD LTD  05/27/2017  . IR PERCUTANEOUS ART THROMBECTOMY/INFUSION INTRACRANIAL INC DIAG ANGIO  05/27/2017  . PACEMAKER INSERTION    . RADIOLOGY WITH ANESTHESIA N/A 05/27/2017   Procedure: RADIOLOGY WITH ANESTHESIA;  Surgeon: Luanne Bras, MD;  Location: [place];  Service: Radiology;  Laterality: N/A;   HPI:  Pt is an 84 y/o male admitted with cough and AMS; concern for RLL/RML  PNA on CXR. He was being treated for suspected UTI PTA. PMH: CVA Feb 2019 with L hemiplegia and dysphagia, A-fib, HTN, pacemaker, CKD III, UTI. Pt is well known to SLP services with most recent [place] in June 2019 showing good airway protection and mild oral deficits; regualr diet, thin liquids recommended at that time. Mild pharyngeal residue was present due to cervical osteophytes that reduce UES opening. Of note, pt clinically had multiple swallows and throat clearing with thin liquids at that time. Additional BSE in July 2019 suggested further improvements in function.   Assessment / Plan / Recommendation Clinical Impression  Pt appears to be at his baseline level of swallow function, which does include a mild oral dysphagia. Upon SLP arrival he had scant amounts of egg from breakfast in his L buccal cavity, which he cleared himself given a verbal reminder to use a lingual sweep. He consumed additional POs with no overt s/s of aspiration as have been seen in the past. It is possible that pt was at a higher risk for  aspiration at home when he his mentation was altered from baseline, but pt/family believe he is returning to baseline. Recommend continuing regular diet and thin liquids; no acute SLP needs identified. SLP Visit Diagnosis: Dysphagia, oral phase (R13.11)    Aspiration Risk  Mild aspiration risk    Diet Recommendation Regular;Thin liquid   Liquid Administration via: Cup;Straw Medication Administration: Whole meds with liquid Supervision: Patient able to self feed;Intermittent supervision to cue for compensatory strategies;Comment(set-up assist PRN) Compensations: Slow rate;Small sips/bites;Lingual sweep for clearance of pocketing Postural Changes: Seated upright at 90 degrees    Other  Recommendations Oral Care Recommendations: Oral care BID   Follow up Recommendations None      Frequency and Duration            Prognosis        Swallow Study   General HPI: Pt is an 84 y/o male admitted with cough and AMS; concern for RLL/RML  PNA on CXR. He was being treated for suspected UTI PTA. PMH: CVA Feb 2019 with L hemiplegia and dysphagia, A-fib, HTN, pacemaker, CKD III, UTI. Pt is well known to SLP services with most recent [place] in June 2019 showing good airway protection and mild oral deficits; regualr diet, thin liquids recommended at that time. Mild pharyngeal residue was present due to cervical osteophytes that reduce UES opening. Of note, pt clinically had multiple swallows and throat clearing with thin liquids at that time. Additional BSE in July 2019 suggested further improvements in function. Type of Study:  Bedside Swallow Evaluation Previous Swallow Assessment: see HPI Diet Prior to this Study: Regular;Thin liquids Temperature Spikes Noted: No Respiratory Status: Room air History of Recent Intubation: No Behavior/Cognition: Alert;Cooperative;Pleasant mood Oral Cavity Assessment: Within Functional Limits Oral Care Completed by SLP: No Oral Cavity - Dentition: Missing  dentition(missing upper dentition, does not use dentures) Vision: Functional for self-feeding Self-Feeding Abilities: Able to feed self;Needs assist Patient Positioning: Upright in bed Baseline Vocal Quality: Normal Volitional Cough: Strong    Oral/Motor/Sensory Function Overall Oral Motor/Sensory Function: (baseline L-sided sensorimotor deficits from prior CVA)   Ice Chips Ice chips: Not tested   Thin Liquid Thin Liquid: Within functional limits Presentation: Self Fed;Straw    Nectar Thick Nectar Thick Liquid: Not tested   Honey Thick Honey Thick Liquid: Not tested   Puree Puree: Within functional limits Presentation: Self Fed;Spoon   Solid     Solid: Impaired Presentation: Self Fed Oral Phase Functional Implications: Left lateral sulci pocketing      [name] 04/01/2018,11:47 AM  [name], M.A. [place] Acute Environmental education officer [phone] Office [phone]

## 2018-04-02 DIAGNOSIS — R339 Retention of urine, unspecified: Secondary | ICD-10-CM | POA: Diagnosis present

## 2018-04-02 DIAGNOSIS — J918 Pleural effusion in other conditions classified elsewhere: Secondary | ICD-10-CM

## 2018-04-02 DIAGNOSIS — J189 Pneumonia, unspecified organism: Principal | ICD-10-CM

## 2018-04-02 LAB — BASIC METABOLIC PANEL
Anion gap: 10 (ref 5–15)
BUN: 31 mg/dL — ABNORMAL HIGH (ref 8–23)
CO2: 23 mmol/L (ref 22–32)
Calcium: 9 mg/dL (ref 8.9–10.3)
Chloride: 108 mmol/L (ref 98–111)
Creatinine, Ser: 1.31 mg/dL — ABNORMAL HIGH (ref 0.61–1.24)
GFR calc Af Amer: 57 mL/min — ABNORMAL LOW (ref >60)
GFR calc non Af Amer: 49 mL/min — ABNORMAL LOW (ref >60)
Glucose, Bld: 128 mg/dL — ABNORMAL HIGH (ref 70–99)
Potassium: 4.4 mmol/L (ref 3.5–5.1)
Sodium: 141 mmol/L (ref 135–145)

## 2018-04-02 MED ORDER — ACETAMINOPHEN 325 MG PO TABS
650.0000 mg | ORAL_TABLET | Freq: Four times a day (QID) | ORAL | Status: DC | PRN
  Administered 2018-04-02 – 2018-04-03 (×2): 650 mg via ORAL
  Filled 2018-04-02 (×2): qty 2

## 2018-04-02 NOTE — Progress Notes (Deleted)
Subjective: Derek Hays  Had a good night overnight although he states that he did throw up. He drank cold water, which he states upsets his stomach. His caregiver is at bedside and states he drinks cold water at home. She states that he has not been eating while here. He states some of the food hasn't been appetizing.  States he has no SOB and usually is at 97-99% without supplemental O2. He does not use supplemental O2 at home.   Overnight, medical team was called to the room for "yellow bilious vomiting". Upon evaluation, the yellow fluid on his towel was noted to most likely spit up form the yellow drink that he had next to his bed. Was given dose of phenergan and not paged for the rest of the night. Objective: Vital signs in last 24 hours: Vitals:   04/01/18 0823 04/01/18 1100 04/01/18 1618 04/01/18 2310  BP: (!) 141/92 138/77 (!) 147/98 (!) 108/57  Pulse: 60 60 74 60  Resp: 20 20 20  (!) 22  Temp: 98.1 F (36.7 C) 98.1 F (36.7 C) 98 F (36.7 C) 99 F (37.2 C)  TempSrc: Oral Oral Oral Oral  SpO2: 99% 100% 98% 100%  Weight:      Height:        Intake/Output Summary (Last 24 hours) at 04/02/2018 0840 Last data filed at 04/01/2018 1900 Gross per 24 hour  Intake 240 ml  Output 600 ml  Net -360 ml   General appearance: alert, cooperative and no distress Head: Normocephalic, without obvious abnormality, atraumatic Lungs: no retractions, diminished breath sounds bilaterally and no crackles noted on exam Heart: heart sounds distant Abdomen: soft, nontender, bladder distension on palpation Extremities: venous stasis dermatitis noted and no pretibial pitting edema, feet cold Neurologic: Mental status: Alert, oriented, thought content appropriate Motor: persistent leftsided hemiplegia, right sided strength intact   Medications: I have reviewed the patient's current medications. Scheduled Meds: . apixaban  2.5 mg Oral BID  . atorvastatin  40 mg Oral q1800  . latanoprost  1 drop Both  Eyes QHS  . levothyroxine  125 mcg Oral Q0600  . tamsulosin  0.4 mg Oral QHS   Continuous Infusions: . cefTRIAXone (ROCEPHIN)  IV 1 g (04/01/18 1707)    Assessment/Plan: Principal Problem:   CAP (community acquired pneumonia) Active Problems:   Stage 3 chronic kidney disease (HCC)   PAF (paroxysmal atrial fibrillation) (HCC)   Acute respiratory failure with hypoxia (HCC)  Pneumonia Crackles not heard on exam today and oxygen stopped without desaturations. He has chronic pleural effusions but on admission was noted to have larger right sided pleural effusion in lower lobes where concurrent pneumonia. Currently there are no signs that this is a complicated pleural effusion but will cont to monitor O2 sats now off oxygen to see if the effusion may be contributing to desaturations.   -IV Ceftriaxone 1 g q24 (Day 3) -azithromycin stopped due to low likelihood that pneumonia is caused by atypical bugs - Incentive spirometry -O2 d/c'ed, will remain off unless he is having persistent desaturations -If oxygen demand increases again, will consider thoracentesis to see if this has an effect on respiratory status  Dysuria: He reports resolution of symptoms including no belly pain or burning with urination. Urine culture resulted with mixed species and recommended recollection. He is not 3 days into his abx regimen so recollection will likely not be useful. Will cont to treat empirically for UTI given results of urine culture were inconclusive.   -IV ceftriaxone 1  g q24   CKD stage 3/Mild Chronic Hydronephrosis/Prerenal Azotemia Cr now back to baseline at 1.3 with fluid resuscitation. Bladder scan this AM resulted with 283 post void but he has a condom cath so it is difficult to tell if this is a reliable post void scan. On exam today bladder feels distended but he is without pain. With his prior hx of urethral injury, we would like to avoid foley cathing him if possible. Started tamsulosin back  last night so will wait another day to see if retention is ongoing before in/out cathing him.  -Post void bladder scan to r/o retention/obstructive cause  -Foley catheter if signs of retaining urine on post void bladder scan -IVF NS at 50 ml/hr  Altered Mental Status: AMS on admission resolved, as family states that he is back to baseline today. Most likely cause of AMS was underlying infection. BMP wnl ruling out metabolic causes, and stable CT r/o stroke as cause of AMS.   Thickened Bladder wall: Urology consulted and they recommend f/u outpatient for cystoscopy. Wife made aware of possible recurrence of cancer and agrees to f/u outpatient.   HFpEF: No signs of acute exacerbation. Will cont to monitor of volume status.  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 Will cont to monitor weakness and AMS but unlikely contributing to presentation of AMS given resolution starting abx. PT/OT recommended CIR however received 1 month of of inpatient rehab after he suffered from the stroke in 05/2017. Per evaluation by Inpatient Rehab coordinator, hewill benefit from SNF placement -cont Crestor  -CSW to follow for SNF placement   Hx HTN: BP stable, will cont to monitor  Hx of Heart Block S/p ventricular pacemaker placement. Rate controlled and stable at 60-65 bpm. Per wife, he has been bradycardic in the past with HR in 20s.  -cont to monitor   Hx Afib -rate controlled via pacemaker, cont eliquis 2.5 mg BID  FENGI: Replace electrolytes as needed, IVF @50mL /hr, reg diet; cleared by SLP not at increased risk of aspiration VTE Prophylaxis: Eliquis CODE STATUS: Full code  Dispo: Waiting SNF placement as he is not eligible for rehab per PT recommendations.    This is a Careers information officer Note.  The care of the patient was discussed with Dr. Rebeca Alert and the assessment and plan formulated with their assistance.  Please see their attached  note for official documentation of the daily encounter.   LOS: 2 days   Synetta Shadow, Medical Student 04/02/2018, 7:04 AM

## 2018-04-02 NOTE — Progress Notes (Addendum)
Subjective: HD#2  Overnight: Medical team was called to the room for "yellow bilious vomiting". Upon evaluation, the yellow fluid on his towel was noted to most likely spit up form the yellow drink that he had next to his bed. Was given dose of phenergan and not paged for the rest of the night.   Had a good night overnight although he states that he did throw up. He drank cold water, which he states upsets his stomach. His caregiver is at bedside and states he drinks cold water at home. She states that he has not been eating while here. He states some of the food hasn't been appetizing.  He denies SOB and usually is at 97-99% without supplemental O2. He has never required supplemental O2 at home.    Objective: Vital signs in last 24 hours: Vitals:   04/01/18 1100 04/01/18 1618 04/01/18 2310 04/02/18 0842  BP: 138/77 (!) 147/98 (!) 108/57 125/75  Pulse: 60 74 60 60  Resp: 20 20 (!) 22   Temp: 98.1 F (36.7 C) 98 F (36.7 C) 99 F (37.2 C) (!) 97.5 F (36.4 C)  TempSrc: Oral Oral Oral Oral  SpO2: 100% 98% 100%   Weight:      Height:        Intake/Output Summary (Last 24 hours) at 04/02/2018 1258 Last data filed at 04/01/2018 1900 Gross per 24 hour  Intake 120 ml  Output 600 ml  Net -480 ml   General appearance: alert, cooperative and no distress Head: Normocephalic, without obvious abnormality, atraumatic Lungs: no retractions, diminished breath sounds bilaterally and no crackles noted on exam Heart: heart sounds distant Abdomen: soft, nontender, bladder distension on palpation Extremities: venous stasis dermatitis noted and no pretibial pitting edema, feet cold Neurologic: Mental status: Alert, oriented, thought content appropriate Motor: persistent leftsided hemiplegia, right sided strength intact   Medications: I have reviewed the patient's current medications. Scheduled Meds: . apixaban  2.5 mg Oral BID  . atorvastatin  40 mg Oral q1800  . latanoprost  1 drop Both  Eyes QHS  . levothyroxine  125 mcg Oral Q0600  . tamsulosin  0.4 mg Oral QHS   Continuous Infusions: . cefTRIAXone (ROCEPHIN)  IV 1 g (04/01/18 1707)    Assessment/Plan: Principal Problem:   CAP (community acquired pneumonia) Active Problems:   Stage 3 chronic kidney disease (HCC)   PAF (paroxysmal atrial fibrillation) (HCC)   Acute respiratory failure with hypoxia (HCC)  Pneumonia Crackles not present on exam today and oxygen discontinued without desaturations.  He maintained SPO2 between 97 and 99%.  He was noted to have bilateral pleural effusion (R>L) on further investigation he was noted to have trace bilateral pleural effusion on previous chest x-ray. Currently there are no signs that this is a complicated pleural effusion but will continue to monitor O2 sats now off oxygen to evaluate if the effusion may be contributing to desaturations.  So far his vitals are reassuring.  -IV Ceftriaxone 1 g q24 (Day 3) -azithromycin stopped due to low likelihood that pneumonia is caused by atypical bugs - Incentive spirometry -O2 d/c'ed, will remain off unless he is having persistent desaturations -If oxygen demand increases again, will consider thoracentesis to see if this has an effect on respiratory status  Dysuria: He reports resolution of symptoms including no belly pain or burning with urination. Urine culture resulted with mixed species and recommended recollection. He is now 3 days into his abx regimen so recollection will likely not be useful. Will  cont to treat empirically for UTI given results of urine culture were inconclusive.   -IV ceftriaxone 1 g q24   CKD stage 3/Mild Chronic Hydronephrosis/Prerenal Azotemia Cr now back to baseline at 1.3 with fluid resuscitation. Bladder scan this AM resulted with 283 post void but he has a condom cath so it is difficult to tell if this is a reliable post void scan. On exam today bladder feels distended but he is without pain. With his prior hx  of urethral injury, we would like to avoid foley cathing him if possible. Started tamsulosin back last night so will wait another day to see if retention is ongoing before in/out cathing him.  -Post void bladder scan to r/o retention/obstructive cause  -Foley catheter if signs of retaining urine on post void bladder scan  Altered Mental Status: AMS on admission resolved, as family states that he is back to baseline today. Most likely cause of AMS was underlying infection. BMP wnl ruling out metabolic causes, and stable CT r/o stroke as cause of AMS.   Thickened Bladder wall: Urology consulted and they recommend f/u outpatient for cystoscopy. Wife made aware of possible recurrence of cancer and agrees to f/u outpatient.   HFpEF: No signs of acute exacerbation. Will cont to monitor of volume status.  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 Will cont to monitor weakness and AMS but unlikely contributing to presentation of AMS given resolution starting abx. PT/OT recommended CIR however received 1 month of of inpatient rehab after he suffered from the stroke in 05/2017. Per evaluation by Inpatient Rehab coordinator, hewill benefit from SNF placement -cont Crestor  -CSW to follow for SNF placement   Hx HTN: BP stable, will cont to monitor  Hx of Heart Block S/p ventricular pacemaker placement. Rate controlled and stable at 60-65 bpm. Per wife, he has been bradycardic in the past with HR in 20s.  -cont to monitor   Hx Afib -rate controlled via pacemaker, cont eliquis 2.5 mg BID  FENGI: Replace electrolytes as needed, regular diet; cleared by SLP not at increased risk of aspiration VTE Prophylaxis: Eliquis CODE STATUS: Full code  Dispo: Waiting SNF placement as he is not eligible for rehab per PT recommendations.    This is a Careers information officer Note.  The care of the patient was discussed with Dr. Rebeca Alert and the assessment and  plan formulated with their assistance.  Please see their attached note for official documentation of the daily encounter.   LOS: 2 days   Synetta Shadow, Medical Student 04/02/2018, 12:58 PM   Attestation for Student Documentation:  I personally was present and performed or re-performed the history, physical exam and medical decision-making activities of this service and have verified that the service and findings are accurately documented in the student's note.  Jean Rosenthal, MD 04/02/2018, 1:20 PM

## 2018-04-02 NOTE — Progress Notes (Signed)
  Date: 04/02/2018  Patient name: TARRELL DEBES  Medical record number: [id number]  Date of birth: 1930/12/03   I have seen and evaluated this patient and I have discussed the plan of care with the house staff. Please see their note for complete details. I concur with their findings with the following additions/corrections:   Continues to improve, mental status seems appropriate, although home caregiver was at bedside today and was concerned he was talking to his dead father and possible having visual hallucinations. Behavior completely appropriate with our team, including remembering some of [place] from yesterday. Caregiver also concerned he is not eating well. Lungs have cleared a little, able to wean from O2 during exam with sat >92%.   Continuing CTX for CAP, appears to have uncomplicated parapneumonic effusion vs unrelated pleural effusion (was seen previously on CXR in July, but difficult to assess relative size). Given his symptomatic improvement, no indication to proceed with thoracentesis at this time, but requires serial imaging with CXR or [place] and thoracentesis if it persists.   May also have had UTI that failed outpatient antibiotics as he had persistent pyuria and dysuria on presentation, responding well to CTX, will plan to transition to cefdinir at discharge and complete 7 day course for CAP with effusion.   Having some urinary retention, have restarted tamsulosin, will follow and consider straight cath if persistently >200 cc in bladder. Creatinine has returned to baseline of 1.3.   Lenice Pressman, M.D., Ph.D. 04/02/2018, 4:10 PM

## 2018-04-02 NOTE — NC FL2 (Signed)
[place] MEDICAID FL2 LEVEL OF CARE SCREENING TOOL     IDENTIFICATION  Patient Name: Derek Hays Birthdate: Feb 09, 1931 Sex: male Admission Date (Current Location): 12020/08/2417  [facility] [place] [address] and [place] Number:  Herbalist and Address:  [facility], [place] [address] [place], [place] [address]      Provider Number: [id number]  Attending Physician Name and Address:  Oda Kilts, MD  Relative Name and Phone Number:       Current Level of Care: Hospital Recommended Level of Care: [place] Prior Approval Number:    Date Approved/Denied:   PASRR Number: [id number]  Discharge Plan: SNF    Current Diagnoses: Patient Active Problem List   Diagnosis Date Noted  . CAP (community acquired pneumonia) 12020/08/2417  . Permanent atrial fibrillation   . SIRS (systemic inflammatory response syndrome) ([place]) 11/05/2017  . Urinary tract infection 09/23/2017  . Acute respiratory failure with hypoxia ([place]) 09/23/2017  . Oropharyngeal dysphagia 09/23/2017  . Palliative care by specialist   . Sepsis due to urinary tract infection ([place])   . HCAP (healthcare-associated pneumonia)   . Altered mental status 08/10/2017  . Acute blood loss anemia   . Stage 3 chronic kidney disease ([place])   . PAF (paroxysmal atrial fibrillation) ([place])   . Benign essential HTN   . Dysphagia, post-stroke   . 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 Stroke (cerebrum) ([place]) 05/27/2017    Orientation RESPIRATION BLADDER Height & Weight     Self, Situation, Place  O2(Nasal Cannula 2L) Incontinent Weight: 190 lb (86.2 kg) Height:  5\' 10"  (177.8 cm)  BEHAVIORAL SYMPTOMS/MOOD NEUROLOGICAL BOWEL NUTRITION STATUS      Incontinent Diet(heart healthy/thin liquids)  AMBULATORY STATUS COMMUNICATION OF NEEDS Skin   Extensive Assist Verbally Normal                       Personal Care  Assistance Level of Assistance  Bathing, Feeding, Dressing Bathing Assistance: Maximum assistance Feeding assistance: Limited assistance Dressing Assistance: Maximum assistance     Functional Limitations Info  Sight, Hearing, Speech Sight Info: Adequate Hearing Info: Adequate Speech Info: Adequate    SPECIAL CARE FACTORS FREQUENCY  PT (By licensed PT), OT (By licensed OT)     PT Frequency: 2x OT Frequency: 2x            Contractures Contractures Info: Not present    Additional Factors Info  Code Status, Allergies Code Status Info: Full Code Allergies Info: No known allergies           Current Medications (04/02/2018):  This is the current hospital active medication list Current Facility-Administered Medications  Medication Dose Route Frequency Provider Last Rate Last Dose  . apixaban (ELIQUIS) tablet 2.5 mg  2.5 mg Oral BID Ledell Noss, MD   2.5 mg at 04/02/18 0856  . atorvastatin (LIPITOR) tablet 40 mg  40 mg Oral q1800 Ledell Noss, MD   40 mg at 04/01/18 1715  . cefTRIAXone (ROCEPHIN) 1 g in sodium chloride 0.9 % 100 mL IVPB  1 g Intravenous Q24H Agyei, Obed K, MD 200 mL/hr at 04/01/18 1707 1 g at 04/01/18 1707  . latanoprost (XALATAN) 0.005 % ophthalmic solution 1 drop  1 drop Both Eyes QHS Ledell Noss, MD   1 drop at 04/01/18 2130  . levothyroxine (SYNTHROID, LEVOTHROID) tablet 125 mcg  125 mcg Oral Q0600 Ledell Noss, MD  125 mcg at 04/02/18 0559  . promethazine (PHENERGAN) tablet 25 mg  25 mg Oral Q6H PRN Chundi, Vahini, MD   25 mg at 04/01/18 2010  . tamsulosin (FLOMAX) capsule 0.4 mg  0.4 mg Oral QHS Katherine Roan, MD   0.4 mg at 04/01/18 2129     Discharge Medications: Please see discharge summary for a list of discharge medications.  Relevant Imaging Results:  Relevant Lab Results:   Additional Information SS# [place] Lya Holben, LCSW

## 2018-04-02 NOTE — Discharge Summary (Addendum)
Name: Derek Hays MRN: [id number] DOB: 08/19/1930 84 y.o. PCP: [facility]  Date of Admission: 1February 07, 202019 11:01 AM Date of Discharge: 04/04/18 Attending Physician: Oda Kilts, MD  Discharge Diagnosis: 1.  Community-acquired pneumonia 2.  Urinary tract infection 3.  Bladder Wall Thickening 4.  Paroxysmal atrial fibrillation 5.  History of CVA with left sided hemiplegia 6.  Status post pacemaker placement 7.  Heart failure with preserved ejection fraction 8.  Hyperthyroidism status post thyroidectomy 9.  CKD 3 10.  History of bladder cancer status post urethral resection   Discharge Medications: Allergies as of 04/04/2018   No Known Allergies     Medication List    TAKE these medications   albuterol (2.5 MG/3ML) 0.083% nebulizer solution Commonly known as:  PROVENTIL Take 3 mLs (2.5 mg total) by nebulization every 6 (six) hours. What changed:    when to take this  reasons to take this   amLODipine 10 MG tablet Commonly known as:  NORVASC Take 1 tablet (10 mg total) by mouth daily.   apixaban 5 MG Tabs tablet Commonly known as:  ELIQUIS Take 1 tablet (5 mg total) by mouth 2 (two) times daily.   atorvastatin 40 MG tablet Commonly known as:  LIPITOR Take 1 tablet (40 mg total) by mouth daily at 6 PM.   benzonatate 100 MG capsule Commonly known as:  TESSALON Take 200 mg by mouth 3 (three) times daily as needed for cough.   bethanechol 25 MG tablet Commonly known as:  URECHOLINE Take 1 tablet (25 mg total) by mouth 3 (three) times daily. What changed:  additional instructions   cefdinir 300 MG capsule Commonly known as:  OMNICEF Take 1 capsule (300 mg total) by mouth every 12 (twelve) hours for 3 days.   guaiFENesin 200 MG tablet Take 200 mg by mouth every 4 (four) hours as needed for cough or to loosen phlegm.   levothyroxine 125 MCG tablet Commonly known as:  SYNTHROID, LEVOTHROID Take 125 mcg by mouth daily.   pregabalin  75 MG capsule Commonly known as:  LYRICA Take 75 mg by mouth 2 (two) times daily.   REFRESH TEARS 0.5 % Soln Generic drug:  carboxymethylcellulose Place 1 drop into both eyes every 6 (six) hours as needed (dry eyes).   tamsulosin 0.4 MG Caps capsule Commonly known as:  FLOMAX Take 1 capsule (0.4 mg total) by mouth daily after supper. What changed:  when to take this   XALATAN 0.005 % ophthalmic solution Generic drug:  latanoprost Place 1 drop into both eyes at bedtime.       Disposition and follow-up:   DerekDerek Hays was discharged from [facility] in [place] condition.  At the hospital follow up visit please address:  1.  Community-acquired pneumonia: Please ensure completion of cefdinir until April 06, 2018.  Bilateral pleural effusion: Noted to have pleural effusion on chest x-ray in July 2019 and CT scanning during this admission.  Remained hemodynamically stable without hypoxia and no supplemental oxygen requirement at discharge.  Will require repeat chest x-ray in 4 to 6 weeks.  Dysuria (Urinary tract infection vs Bladder wall irritation) Continue cefdinir until April 06, 2018.  Bladder wall thickening: Noted to have posterior lateral bladder wall thickening.  Seen by urology.  Plan is to undergo outpatient cystoscopy.  Peripheral Neuropathy: Prior history of peripheral neuropathy for which he was taking Lyrica 200 mg at home before admission. Started Lyrica at 75 mg q24h and increased to  75 mg BID by discharge.  Recommend titrating up the Lyrica after discharge.   2.  Labs / imaging needed at time of follow-up: None  3.  Pending labs/ test needing follow-up: Urinalysis, Urine Culture   Follow-up Appointments: Appt with Urology not scheduled yet but discussed with his wife about scheduling an appt.   Hospital Course by problem list: 1.  Community-acquired pneumonia: [name] is a pleasant 84 year old gentleman with HFpEF, atrial fibrillation  on Eliquis, CVA, pacemaker placement, hyperthyroidism status post thyroidectomy, CKD 3 and bladder cancer status post transurethral resection who presented with lethargy. On admission, he was noted to have altered mental status, increased cough/sputum production, crackles in RLL, R mid/lower lobe infiltrates, and new onset oxygen requirement indicative of pneumonia. He was subsequently started on ceftriaxone and azithromycin for empiric treatment of community acquired pneumonia. By hospital day 2 with continued abx, he remained afebrile, AMS was resolved, crackles not present on exam, and he was maintaining oxygen saturation between  97-98% on RA. He was transitioned to oral cefdinir and discharged with 4 days left of abx.  He is to continue cefdinir until April 06, 2018.  2. UTI: [name] has history of recurrent UTIs. On admission, he presented s/p 10 days of outpatient Bactrim therapy for ongoing UTI without resolution of dysuria and suprapubic pain.  He was started on ceftriaxone. By hospital day 1, dysuria and suprapubic pain resolved with Ceftriaxone therapy. Abx were transitioned to oral Cefdinir and he was d/c'ed with 4 days of abx left.  3. Bladder Wall thickening: On admission, abdominal imaging showed increased bladder wall thickening suspect for recurrent bladder cancer. Urology was consulted and recommended outpatient followup for cystoscopy.   Discharge Vitals:   BP (!) 175/95   Pulse (!) 59   Temp (!) 97.5 F (36.4 C) (Oral)   Resp 18   Ht 5\' 10"  (1.778 m)   Wt 86.2 kg   SpO2 100%   BMI 27.26 kg/m   Pertinent Labs, Studies, and Procedures:  CBC Latest Ref Rng & Units 04/01/2018 106-11-2017 106-11-2017  WBC 4.0 - 10.5 K/uL 7.3 - 9.4  Hemoglobin 13.0 - 17.0 g/dL 12.5(L) 13.3 12.9(L)  Hematocrit 39.0 - 52.0 % 39.4 39.0 42.9  Platelets 150 - 400 K/uL 141(L) - 188   CMP Latest Ref Rng & Units 04/03/2018 04/02/2018 04/01/2018  Glucose 70 - 99 mg/dL 92 128(H) 86  BUN 8 - 23  mg/dL 29(H) 31(H) 33(H)  Creatinine 0.61 - 1.24 mg/dL 1.18 1.31(H) 1.40(H)  Sodium 135 - 145 mmol/L 140 141 141  Potassium 3.5 - 5.1 mmol/L 3.9 4.4 4.4  Chloride 98 - 111 mmol/L 106 108 107  CO2 22 - 32 mmol/L 22 23 24   Calcium 8.9 - 10.3 mg/dL 9.1 9.0 9.1  Total Protein 6.5 - 8.1 g/dL - - -  Total Bilirubin 0.3 - 1.2 mg/dL - - -  Alkaline Phos 38 - 126 U/L - - -  AST 15 - 41 U/L - - -  ALT 0 - 44 U/L - - -   CT abdomen and pelvis. IMPRESSION: 1.  Pleural effusions larger on the right than the left, layering dependently with dependent atelectasis. 2.  No sign of diverticulitis as questioned clinically. Mild diverticulosis. 3.  No right kidney. Left kidney shows fullness of the renal collecting system an ureter all the way to the bladder. There is deformity of the left bladder wall with some apparent thickening. The patient has a history of bladder cancer. I  certainly could not rule out recurrent disease along the left bladder side wall. There may well be an element of ureteral obstruction. 4.  Aortic atherosclerosis.  Chest x-ray. IMPRESSION: Patchy infiltrate right mid and lower lung zones, most likely representing a degree of pneumonia. Lungs elsewhere clear. Stable cardiac prominence with pacemaker leads attached to right atrium and right ventricle. There is aortic atherosclerosis.  Discharge Instructions: Discharge Instructions    Call MD for:  persistant nausea and vomiting   Complete by:  As directed    Call MD for:  temperature >100.4   Complete by:  As directed    Diet - low sodium heart healthy   Complete by:  As directed    Discharge instructions   Complete by:  As directed    Derek Hays,  It was a pleasure taking care of you at the hospital.  You came in because of confusion, weakness and burning with urination.  During the course of the hospital stay your confusion resolved.  We found that you had a pneumonia which might have contributed.  We treated you  with IV antibiotics and switch you to oral antibiotic called cefdinir.  You will take cefdinir 300 mg by mouth 2 times a day for an additional 4 days.  Also for the burning with urination you are having, we tested your urine and compared it to the one that you had at the [facility].  It did show some improvement however there was still infection.  Because you have had a long history of burning with urination and multiple infections.  We think there is a possibility that the thickness of your bladder could be playing a role.  I have written a letter and will give this to [facility] to deliver to Dr. Aretha Parrot, your urologist.  Will require a cystoscopy.  Take care and happy holidays ~Dr. Eileen Stanford, Levada Schilling and Dr. Rebeca Alert   Increase activity slowly   Complete by:  As directed       Signed:    Internal Medicine Attending Note:  I saw and examined the patient on the day of discharge. I reviewed and agree with the discharge summary written by the house staff.  Lenice Pressman, M.D., Ph.D.

## 2018-04-03 LAB — URINALYSIS, ROUTINE W REFLEX MICROSCOPIC
Bilirubin Urine: NEGATIVE
Glucose, UA: NEGATIVE mg/dL
Ketones, ur: NEGATIVE mg/dL
Nitrite: NEGATIVE
Protein, ur: 30 mg/dL — AB
RBC / HPF: >50 RBC/hpf — ABNORMAL HIGH (ref 0–5)
Specific Gravity, Urine: 1.021 (ref 1.005–1.030)
pH: 5 (ref 5.0–8.0)

## 2018-04-03 LAB — BASIC METABOLIC PANEL
Anion gap: 12 (ref 5–15)
BUN: 29 mg/dL — ABNORMAL HIGH (ref 8–23)
CO2: 22 mmol/L (ref 22–32)
Calcium: 9.1 mg/dL (ref 8.9–10.3)
Chloride: 106 mmol/L (ref 98–111)
Creatinine, Ser: 1.18 mg/dL (ref 0.61–1.24)
GFR calc Af Amer: >60 mL/min (ref >60)
GFR calc non Af Amer: 56 mL/min — ABNORMAL LOW (ref >60)
Glucose, Bld: 92 mg/dL (ref 70–99)
Potassium: 3.9 mmol/L (ref 3.5–5.1)
Sodium: 140 mmol/L (ref 135–145)

## 2018-04-03 MED ORDER — CEFDINIR 300 MG PO CAPS
300.0000 mg | ORAL_CAPSULE | Freq: Two times a day (BID) | ORAL | Status: DC
  Administered 2018-04-03 – 2018-04-04 (×2): 300 mg via ORAL
  Filled 2018-04-03 (×3): qty 1

## 2018-04-03 MED ORDER — PREGABALIN 75 MG PO CAPS
75.0000 mg | ORAL_CAPSULE | Freq: Every day | ORAL | Status: DC
  Administered 2018-04-03 – 2018-04-04 (×2): 75 mg via ORAL
  Filled 2018-04-03 (×2): qty 1

## 2018-04-03 MED ORDER — ACETAMINOPHEN 325 MG PO TABS
650.0000 mg | ORAL_TABLET | Freq: Four times a day (QID) | ORAL | Status: DC | PRN
  Administered 2018-04-03 – 2018-04-04 (×4): 650 mg via ORAL
  Filled 2018-04-03 (×4): qty 2

## 2018-04-03 MED ORDER — CEFDINIR 300 MG PO CAPS
300.0000 mg | ORAL_CAPSULE | Freq: Two times a day (BID) | ORAL | Status: DC
  Filled 2018-04-03: qty 1

## 2018-04-03 MED ORDER — CEFDINIR 300 MG PO CAPS: 300.0000 mg | ORAL_CAPSULE | Freq: Two times a day (BID) | ORAL | Status: DC

## 2018-04-03 NOTE — Progress Notes (Signed)
Subjective: HD #3  Overnight: No acute events  [name] was examined at bedside today with his wife Basilia Jumbo) and home health aide present.  The family was concerned about ongoing dysuria which [name] did confirm.  His wife reports that he usually downplays his symptoms.  In addition, he complains of neuropathic pain in his bilateral lower extremities for which he received heating pads and Tylenol overnight.  The wife states that he was previously on Lyrica 75 mg twice daily which was discontinued by a physician at the [place].  He reports improvement with shortness of breath and denies cough, fever, chills, nausea.  Objective: Vital signs in last 24 hours: Vitals:   04/02/18 0842 04/02/18 1822 04/03/18 0005 04/03/18 0852  BP: 125/75 (!) 129/93 (!) 151/93 131/68  Pulse: 60 (!) 59 60 (!) 59  Resp:   20   Temp: (!) 97.5 F (36.4 C) (!) 97.5 F (36.4 C) 98 F (36.7 C) 97.7 F (36.5 C)  TempSrc: Oral  Oral   SpO2:  99% 100% 97%  Weight:      Height:       Weight change:   Intake/Output Summary (Last 24 hours) at 04/03/2018 1252 Last data filed at 04/03/2018 0600 Gross per 24 hour  Intake 450 ml  Output 2 ml  Net 448 ml   BP 131/68 (BP Location: Right Arm)   Pulse (!) 59   Temp 97.7 F (36.5 C)   Resp 20   Ht 5\' 10"  (1.778 m)   Wt 86.2 kg   SpO2 97%   BMI 27.26 kg/m   Constitutional: In no apparent distress, pleasantly lying in bed, in good and cheerful spirit Cardiovascular: Regular rate and rhythm, no murmurs, gallops, rubs Respiratory: Decreased breath sounds more noticeable on the right though much improvement from previously.  No evidence of crackles or rhonchi. Abdomen: Suprapubic tenderness, bowel sounds present  Studies/Results: No results found. Medications: I have reviewed the patient's current medications. Scheduled Meds: . apixaban  2.5 mg Oral BID  . atorvastatin  40 mg Oral q1800  . latanoprost  1 drop Both Eyes QHS  . levothyroxine  125 mcg Oral  Q0600  . tamsulosin  0.4 mg Oral QHS   Continuous Infusions: . cefTRIAXone (ROCEPHIN)  IV 1 g (04/02/18 1640)   PRN Meds:.acetaminophen, promethazine Assessment/Plan: Principal Problem:   CAP (community acquired pneumonia) Active Problems:   Stage 3 chronic kidney disease (HCC)   PAF (paroxysmal atrial fibrillation) (HCC)   Acute respiratory failure with hypoxia ([place])   Urinary retention  [name] is a 84 year old pleasant gentleman with atrial fibrillation on Eliquis, hypertension, history of right MCA with resultant left-sided hemiplegia 05/2017, chronic subdural hematoma, CKD stage III, HFpEF 60-65%, bladder cancer in 1984 here for management of community-acquired pneumonia involving the right lower/middle lobe and urinary tract infection.  Community-acquired pneumonia w/ uncomplicated parapneumonic effusion: Continues to improve daily.  Now off supplemental oxygen for greater than 48 hours and does not report shortness of breath. Lung sounds unremarkable.  Will pleat 7-day course of antibiotics after IV ceftriaxone is switched to cefdinir. - Discontinue IV ceftriaxone and start cefdinir 300 mg BID (day 4 abx) - Continue to monitor - Supplemental oxygen as needed.  Persistent dysuria: Continues to endorse dysuria and suprapubic tenderness.  This has been thought to have initially resolved with antibiotics.  This could be multifactorial either due to ongoing urinary tract infection resistant to ceftriaxone or bladder irritation from his thickened bladder wall.  Though  there is no sign of hematuria or passage of blood clots.  Previous urinalysis was a dirty catch. -Repeat urinalysis and urine culture -Continue postvoid residual and bladder scans -Continue Flomax  CKD stage III, mild chronic hydronephrosis: Renal function improved.  Bladder scan measuring 138 cc. -Follow-up a.m. BMP  Thickened bladder wall: Known history of bladder cancer in 1884 requiring transurethral resection.   Found to have thickened posterior lateral bladder wall thickening.  Evaluated by urology who recommended outpatient cystoscopy.  The wife reports he follows up with Dr. Aretha Parrot (urologist) at [facility].  Neuropathic pain: Complains of worsening bilateral lower extremity pain which is consistent with his previous diagnosis of neuropathy.  He states that he was previously on Lyrica which was discontinued by a physician in the [place] system. -Start Lyrica 75 mg daily  History of HFpEF: Hemodynamically stable.  No signs of acute heart failure exacerbation. -Continue to monitor  History of right MCA stroke: No new signs of focal neurological deficits.  He does have a history of right MCA stroke which resulted in a left sided hemiplegia.  Continues to work with physical therapy who recommends SNF placement.  Hypertension: BP stable.  ?History of heart block: Questionable history of heart block with persistent bradycardia which required pacemaker placement.  Heart rate has averaged 60 bpm. -Continue to monitor  Atrial fibrillation: Rate controlled with pacemaker, continue Eliquis 2.5 mg twice daily  FENGI: Replace electrolytes as needed, regular diet; cleared by SLP not at increased risk of aspiration VTE Prophylaxis: Eliquis CODE STATUS: Full code  This is a Careers information officer Note.  The care of the patient was discussed with Dr. Eileen Stanford and the assessment and plan formulated with their assistance.  Please see their attached note for official documentation of the daily encounter.   LOS: 3 days   Synetta Shadow, Medical Student 04/03/2018, 12:52 PM  Attestation for Student Documentation:  I personally was present and performed or re-performed the history, physical exam and medical decision-making activities of this service and have verified that the service and findings are accurately documented in the student's note.  Jean Rosenthal, MD 04/03/2018, 2:24 PM

## 2018-04-03 NOTE — Care Management Important Message (Signed)
Important Message  Patient Details  Name: Derek Hays MRN: [id number] Date of Birth: 06-07-1930   Medicare Important Message Given:  Yes    Orbie Pyo 04/03/2018, 2:15 PM

## 2018-04-03 NOTE — Progress Notes (Signed)
Physical Therapy Treatment Patient Details Name: Derek Hays MRN: [id number] DOB: 10/08/1930 Today's Date: 04/03/2018    History of Present Illness Pt is a 84 y/o R HD male w/ PMH: CVA Feb 2019 with L hemiplegia, A-fib, HTN, pacemaker, CKD III, UTI, AMS, Aspiration precautions. Pt reports that he is overall bed bound since Feb 2019, does not stand to transfer, has assistance via [place] ocassionally to use lift at home to transfer to w/c. Pt was admitted to [facility] w/ Dx: pneumonia    PT Comments    Session focused on sitting balance EOB. Pt demonstrating mild improvement with static and dynamic activity. Will cont to follow and progress as tolerated. Noted CIR denial and plan for SNF, feel this will be appropriate.   Follow Up Recommendations  SNF     Equipment Recommendations  None recommended by PT    Recommendations for Other Services Rehab consult     Precautions / Restrictions Precautions Precautions: Fall Restrictions Weight Bearing Restrictions: No Other Position/Activity Restrictions: hoyer transfer to [facility] per family    Mobility  Bed Mobility Overal bed mobility: Needs Assistance Bed Mobility: Supine to Sit;Sit to Supine Rolling: Max assist Sidelying to sit: Total assist Supine to sit: Max assist Sit to supine: Max assist   General bed mobility comments: sitting EOB for 10 miutes working on static and dynamic balance. inititally required max A for stability progresed to supervision intermittent contat guard  Transfers Overall transfer level: Needs assistance               General transfer comment: hoyer transfer, not performed with PT  Ambulation/Gait             General Gait Details: not ambulatory at baseline   Stairs             Wheelchair Mobility    Modified Rankin (Stroke Patients Only) Modified Rankin (Stroke Patients Only) Pre-Morbid Rankin Score: Moderately severe disability Modified Rankin: Severe disability      Balance Overall balance assessment: Needs assistance Sitting-balance support: Feet supported;Bilateral upper extremity supported Sitting balance-Leahy Scale: Poor                                      Cognition Arousal/Alertness: Awake/alert Behavior During Therapy: WFL for tasks assessed/performed Overall Cognitive Status: History of cognitive impairments - at baseline                                        Exercises      General Comments        Pertinent Vitals/Pain Pain Assessment: No/denies pain    Home Living                      Prior Function            PT Goals (current goals can now be found in the care plan section) Acute Rehab PT Goals Patient Stated Goal: to try sitting up PT Goal Formulation: With patient/family Time For Goal Achievement: 04/15/18 Potential to Achieve Goals: Fair Progress towards PT goals: Progressing toward goals    Frequency    Min 2X/week      PT Plan Current plan remains appropriate    Co-evaluation              AM-PAC  PT "6 Clicks" Mobility   Outcome Measure  Help needed turning from your back to your side while in a flat bed without using bedrails?: A Lot Help needed moving from lying on your back to sitting on the side of a flat bed without using bedrails?: A Lot Help needed moving to and from a bed to a chair (including a wheelchair)?: Total Help needed standing up from a chair using your arms (e.g., wheelchair or bedside chair)?: Total Help needed to walk in hospital room?: Total Help needed climbing 3-5 steps with a railing? : Total 6 Click Score: 8    End of Session Equipment Utilized During Treatment: Oxygen Activity Tolerance: Patient limited by fatigue;Treatment limited secondary to medical complications (Comment) Patient left: in bed;with call bell/phone within reach;with bed alarm set;with nursing/sitter in room;with family/visitor present Nurse Communication:  Mobility status PT Visit Diagnosis: Muscle weakness (generalized) (M62.81);Hemiplegia and hemiparesis;Other abnormalities of gait and mobility (R26.89) Hemiplegia - Right/Left: Left Hemiplegia - dominant/non-dominant: Non-dominant Hemiplegia - caused by: Unspecified     Time: 1015-1030 PT Time Calculation (min) (ACUTE ONLY): 15 min  Charges:  $Therapeutic Activity: 8-22 mins                     Reinaldo Berber, PT, DPT Acute Rehabilitation Services Pager: [phone] Office: [phone]     Reinaldo Berber 04/03/2018, 11:41 AM

## 2018-04-03 NOTE — Progress Notes (Signed)
Bladder scan completed. 20-40 ml residual.

## 2018-04-03 NOTE — Clinical Social Work Note (Signed)
Clinical Social Work Assessment  Patient Details  Name: Derek Hays MRN: [id number] Date of Birth: Jun 15, 1930  Date of referral:  04/02/18               Reason for consult:  Facility Placement                Permission sought to share information with:  Chartered certified accountant granted to share information::  Yes, Verbal Permission Granted  Name::     [employer]::     Relationship::  SPouse  Contact Information:     Housing/Transportation Living arrangements for the past 2 months:  Independence of Information:  Patient, Spouse Patient Interpreter Needed:  None Criminal Activity/Legal Involvement Pertinent to Current Situation/Hospitalization:  No - Comment as needed Significant Relationships:  Spouse Lives with:  Spouse Do you feel safe going back to the place where you live?  No Need for family participation in patient care:  No (Coment)  Care giving concerns:  Pt is only alert and oriented x2. Pt's spouse present at bedside.   Social Worker assessment / plan:  CSW spoke with pt's spouse at bedside. Pt has been to [facility] in the past and pt's spouse is very unpleased with facility. Pt's spouse does not want pt to go back there. A list of SNF's were provided at bedside. Pt's spouse to look over list and CSW to follow up with pt's spouse this morning.   Employment status:  Retired Forensic scientist:  Medicare PT Recommendations:  [place] / Referral to community resources:  [place]  Patient/Family's Response to care:  Pt verbalized understanding of CSW role and expressed appreciation for support. Pt denies any concern regarding pt care at this time.   Patient/Family's Understanding of and Emotional Response to Diagnosis, Current Treatment, and Prognosis:  Pt understanding and realistic regarding physical limitations. Pt understands the need for SNF placement at d/c. Pt agreeable to SNF  placement at d/c, at this time. Pt's responses emotionally appropriate during conversation with CSW. Pt denies any concern regarding treatment plan at this time. CSW will continue to provide support and facilitate d/c needs.   Emotional Assessment Appearance:  Appears stated age Attitude/Demeanor/Rapport:  Unable to Assess Affect (typically observed):  Unable to Assess Orientation:  Oriented to Self, Oriented to Place Alcohol / Substance use:  Not Applicable Psych involvement (Current and /or in the community):  No (Comment)  Discharge Needs  Concerns to be addressed:  Care Coordination, Basic Needs Readmission within the last 30 days:  No Current discharge risk:  Dependent with Mobility Barriers to Discharge:  Continued Medical Work up   [employer], LCSW 04/03/2018, 10:06 AM

## 2018-04-04 ENCOUNTER — Other Ambulatory Visit: Payer: Self-pay | Admitting: Internal Medicine

## 2018-04-04 DIAGNOSIS — N390 Urinary tract infection, site not specified: Secondary | ICD-10-CM

## 2018-04-04 DIAGNOSIS — Z7401 Bed confinement status: Secondary | ICD-10-CM | POA: Diagnosis not present

## 2018-04-04 DIAGNOSIS — N183 Chronic kidney disease, stage 3 (moderate): Secondary | ICD-10-CM | POA: Diagnosis not present

## 2018-04-04 DIAGNOSIS — Z8551 Personal history of malignant neoplasm of bladder: Secondary | ICD-10-CM

## 2018-04-04 DIAGNOSIS — R0689 Other abnormalities of breathing: Secondary | ICD-10-CM | POA: Diagnosis not present

## 2018-04-04 DIAGNOSIS — Z906 Acquired absence of other parts of urinary tract: Secondary | ICD-10-CM

## 2018-04-04 DIAGNOSIS — I130 Hypertensive heart and chronic kidney disease with heart failure and stage 1 through stage 4 chronic kidney disease, or unspecified chronic kidney disease: Secondary | ICD-10-CM | POA: Diagnosis not present

## 2018-04-04 DIAGNOSIS — I69354 Hemiplegia and hemiparesis following cerebral infarction affecting left non-dominant side: Secondary | ICD-10-CM | POA: Diagnosis not present

## 2018-04-04 DIAGNOSIS — I5032 Chronic diastolic (congestive) heart failure: Secondary | ICD-10-CM | POA: Diagnosis not present

## 2018-04-04 DIAGNOSIS — R0989 Other specified symptoms and signs involving the circulatory and respiratory systems: Secondary | ICD-10-CM | POA: Diagnosis not present

## 2018-04-04 DIAGNOSIS — J189 Pneumonia, unspecified organism: Secondary | ICD-10-CM | POA: Diagnosis not present

## 2018-04-04 DIAGNOSIS — G459 Transient cerebral ischemic attack, unspecified: Secondary | ICD-10-CM | POA: Diagnosis not present

## 2018-04-04 DIAGNOSIS — J918 Pleural effusion in other conditions classified elsewhere: Secondary | ICD-10-CM | POA: Diagnosis not present

## 2018-04-04 DIAGNOSIS — I69321 Dysphasia following cerebral infarction: Secondary | ICD-10-CM | POA: Diagnosis not present

## 2018-04-04 DIAGNOSIS — I480 Paroxysmal atrial fibrillation: Secondary | ICD-10-CM

## 2018-04-04 DIAGNOSIS — G9340 Encephalopathy, unspecified: Secondary | ICD-10-CM | POA: Diagnosis not present

## 2018-04-04 DIAGNOSIS — E890 Postprocedural hypothyroidism: Secondary | ICD-10-CM

## 2018-04-04 DIAGNOSIS — J181 Lobar pneumonia, unspecified organism: Secondary | ICD-10-CM | POA: Diagnosis not present

## 2018-04-04 DIAGNOSIS — N1330 Unspecified hydronephrosis: Secondary | ICD-10-CM | POA: Diagnosis not present

## 2018-04-04 DIAGNOSIS — I509 Heart failure, unspecified: Secondary | ICD-10-CM | POA: Diagnosis not present

## 2018-04-04 DIAGNOSIS — R1312 Dysphagia, oropharyngeal phase: Secondary | ICD-10-CM | POA: Diagnosis not present

## 2018-04-04 DIAGNOSIS — I69391 Dysphagia following cerebral infarction: Secondary | ICD-10-CM | POA: Diagnosis not present

## 2018-04-04 DIAGNOSIS — I10 Essential (primary) hypertension: Secondary | ICD-10-CM | POA: Diagnosis not present

## 2018-04-04 DIAGNOSIS — R531 Weakness: Secondary | ICD-10-CM | POA: Diagnosis not present

## 2018-04-04 DIAGNOSIS — Z87440 Personal history of urinary (tract) infections: Secondary | ICD-10-CM | POA: Diagnosis not present

## 2018-04-04 DIAGNOSIS — E0590 Thyrotoxicosis, unspecified without thyrotoxic crisis or storm: Secondary | ICD-10-CM | POA: Diagnosis not present

## 2018-04-04 DIAGNOSIS — Z950 Presence of cardiac pacemaker: Secondary | ICD-10-CM | POA: Diagnosis not present

## 2018-04-04 DIAGNOSIS — M2550 Pain in unspecified joint: Secondary | ICD-10-CM | POA: Diagnosis not present

## 2018-04-04 DIAGNOSIS — J90 Pleural effusion, not elsewhere classified: Secondary | ICD-10-CM | POA: Diagnosis not present

## 2018-04-04 DIAGNOSIS — I69322 Dysarthria following cerebral infarction: Secondary | ICD-10-CM | POA: Diagnosis not present

## 2018-04-04 DIAGNOSIS — I5030 Unspecified diastolic (congestive) heart failure: Secondary | ICD-10-CM | POA: Diagnosis not present

## 2018-04-04 MED ORDER — CEFDINIR 300 MG PO CAPS: 300.0000 mg | ORAL_CAPSULE | Freq: Two times a day (BID) | ORAL | 0 refills | Status: AC

## 2018-04-04 MED ORDER — AMLODIPINE BESYLATE 10 MG PO TABS
10.0000 mg | ORAL_TABLET | Freq: Every day | ORAL | Status: DC
  Administered 2018-04-04: 10 mg via ORAL
  Filled 2018-04-04: qty 1

## 2018-04-04 NOTE — Clinical Social Work Note (Signed)
Clinical Social Worker facilitated patient discharge including contacting patient family and facility to confirm patient discharge plans.  Clinical information faxed to facility and family agreeable with plan.  CSW arranged ambulance transport via [facility] to [facility] (room 311).  RN to call [phone] (ask for 300 hall RN) for report prior to discharge.  Clinical Social Worker will sign off for now as social work intervention is no longer needed. Please consult [place] again if new need arises.  [place], [place]

## 2018-04-04 NOTE — Progress Notes (Signed)
Pt discharged to [facility] and [facility] via [place] per physician order. Report given to Verneda Skill, LPN. Pt. Has personal belongings. Family has agreed with plan.

## 2018-04-04 NOTE — Progress Notes (Signed)
Subjective: HD #4  Overnight: No acute events  Derek Hays is feeling well today. He had some nausea earlier but this has improved. This occur with his ensure. He is ready to go to skilled nursing facility. He has no SOB and cont to maintain his oxygen sats on RA. He still c/o pain in his legs overnight that is related to his peripheral neuropathy but states that tylenol helps. Plan is to discharge him today to SNF.   Objective: Vital signs in last 24 hours: Vitals:   04/03/18 2301 04/04/18 0645 04/04/18 0814 04/04/18 1039  BP: (!) 170/97 (!) 150/99 (!) 181/99 (!) 175/95  Pulse: (!) 59 62 (!) 59   Resp: 18     Temp: 97.7 F (36.5 C)  (!) 97.5 F (36.4 C)   TempSrc: Oral  Oral   SpO2: 98%  100%   Weight:      Height:        Intake/Output Summary (Last 24 hours) at 04/04/2018 1117 Last data filed at 04/04/2018 0634 Gross per 24 hour  Intake -  Output 600 ml  Net -600 ml   BP (!) 175/95   Pulse (!) 59   Temp (!) 97.5 F (36.4 C) (Oral)   Resp 18   Ht 5\' 10"  (1.778 m)   Wt 86.2 kg   SpO2 100%   BMI 27.26 kg/m   Constitutional: In no apparent distress, pleasantly lying in bed, in good and cheerful spirit Cardiovascular: Regular rate and rhythm, no murmurs, gallops, rubs Respiratory: Decreased breath sounds more noticeable on the right though much improvement from previously.  No evidence of crackles or rhonchi. Abdomen: Suprapubic tenderness, bowel sounds present Extremities: no BLE edema  Medications: I have reviewed the patient's current medications. Scheduled Meds: . amLODipine  10 mg Oral Daily  . apixaban  2.5 mg Oral BID  . atorvastatin  40 mg Oral q1800  . cefdinir  300 mg Oral Q12H  . latanoprost  1 drop Both Eyes QHS  . levothyroxine  125 mcg Oral Q0600  . pregabalin  75 mg Oral Daily  . 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  Community-acquired pneumonia w/ uncomplicated parapneumonic effusion: Continues to improve daily.  Remains afebrile without supplemental oxygen demand. Physical exam noticeable for decreased breath sound but no obvious crackles or wheezes. Will continue oral Cefdinir until 04/06/18.  - Cont cefdinir 300 mg BID (day 5 abx)  Persistent dysuria: UA obtained yesterday indicative of no ongoing infection, significant RBCs were noted in the sample but it was obtained via in/out cath so could be due to traumatic hematuria. Urine culture is pending. Could also still be a result of his potential bladder cancer recurrence. Will cont to treat UTI with cefdinir he is receiving for his pneumonia. Recommend a urology consult outpatient for cystoscopy.  -Urine cult pending -Continue Flomax --outpt follow-up with Urology  CKD stage III, mild chronic hydronephrosis: Renal function improved.  Bladder scan measuring  0 cc.  Thickened bladder wall: Known history of bladder cancer in 1884 requiring transurethral resection.  Found to have thickened posterior lateral bladder wall thickening.  Evaluated by urology who recommended outpatient cystoscopy.  The wife reports he follows up with Dr. Aretha Parrot (urologist) at [facility].  Neuropathic pain: Complains of worsening bilateral lower extremity pain which is consistent with his previous diagnosis of neuropathy.  He states that he was  previously on Lyrica which was discontinued by a physician in the [place] system. -titrate Lyrica 75 mg BID -Recommend titrating back to home dose upon d/c  History of HFpEF: Hemodynamically stable.  No signs of acute heart failure exacerbation. -Continue to monitor  History of right MCA stroke: No new signs of focal neurological deficits.  He does have a history of right MCA stroke which resulted in a left sided hemiplegia.  Continues to work with physical therapy who recommends SNF placement.  Hypertension: BP stable.  ?History of heart block: Questionable history of heart block with persistent bradycardia which required pacemaker placement.  Heart rate has averaged 60 bpm. -Continue to monitor  Atrial fibrillation: Rate controlled with pacemaker, continue Eliquis 2.5 mg twice daily  FENGI: Replace electrolytes as needed, regular diet; cleared by SLP not at increased risk of aspiration VTE Prophylaxis: Eliquis CODE STATUS: Full code  This is a Careers information officer Note.  The care of the patient was discussed with Dr. Eileen Stanford and the assessment and plan formulated with their assistance.  Please see their attached note for official documentation of the daily encounter.   LOS: 4 days   Synetta Shadow, Medical Student 04/04/2018, 11:17 AM   Attestation for Student Documentation:  I personally was present and performed or re-performed the history, physical exam and medical decision-making activities of this service and have verified that the service and findings are accurately documented in the student's note.  Jean Rosenthal, MD 04/04/2018, 12:49 PM

## 2018-04-04 NOTE — Progress Notes (Signed)
Occupational Therapy Treatment Patient Details Name: Derek Hays MRN: [id number] DOB: Jun 01, 1930 Today's Date: 04/04/2018    History of present illness Pt is a 84 y/o R HD male w/ PMH: CVA Feb 2019 with L hemiplegia, A-fib, HTN, pacemaker, CKD III, UTI, AMS, Aspiration precautions. Pt reports that he is overall bed bound since Feb 2019, does not stand to transfer, has assistance via [place] ocassionally to use lift at home to transfer to w/c. Pt was [facility] to [facility] w/ Dx: pneumonia   OT comments  Pt making progress with functional goals. Pt performed bed mobility with max A to sit EOB with min A - min guard A for safety, balance/support. Pt participated in grooming tasks with min guard A and tolerated PROM of L UE in multiple planes. Possible d/c to SNF this afternoon. OT will continue to follow acutely  Follow Up Recommendations  Supervision/Assistance - 24 hour;SNF    Equipment Recommendations  None recommended by OT    Recommendations for Other Services      Precautions / Restrictions Precautions Precautions: Fall Precaution Comments: Aspiration Restrictions Weight Bearing Restrictions: No       Mobility Bed Mobility Overal bed mobility: Needs Assistance Bed Mobility: Supine to Sit;Sit to Supine Rolling: Max assist   Supine to sit: Max assist Sit to supine: Max assist   General bed mobility comments: sitting EOB for 10 minutes to participate in grooming tasks and for PROM L UE  Transfers                      Balance Overall balance assessment: Needs assistance Sitting-balance support: Feet supported;Bilateral upper extremity supported Sitting balance-Leahy Scale: Poor                                     ADL either performed or assessed with clinical judgement   ADL Overall ADL's : Needs assistance/impaired     Grooming: Wash/dry hands;Wash/dry face;Min guard;Sitting                                 General ADL  Comments: Pt sat EOB x 10 minutes with min A - min guard fro balance/support for safety     Vision Baseline Vision/History: Wears glasses Patient Visual Report: No change from baseline     Perception     Praxis      Cognition Arousal/Alertness: Awake/alert Behavior During Therapy: WFL for tasks assessed/performed Overall Cognitive Status: History of cognitive impairments - at baseline                                          Exercises     Shoulder Instructions       General Comments      Pertinent Vitals/ Pain       Pain Assessment: No/denies pain  Home Living                                          Prior Functioning/Environment              Frequency  Min 2X/week        Progress Toward  Goals  OT Goals(current goals can now be found in the care plan section)  Progress towards OT goals: Progressing toward goals     Plan Discharge plan remains appropriate    Co-evaluation                 AM-PAC OT "6 Clicks" Daily Activity     Outcome Measure   Help from another person eating meals?: A Little Help from another person taking care of personal grooming?: A Little Help from another person toileting, which includes using toliet, bedpan, or urinal?: Total Help from another person bathing (including washing, rinsing, drying)?: Total Help from another person to put on and taking off regular upper body clothing?: A Lot Help from another person to put on and taking off regular lower body clothing?: Total 6 Click Score: 11    End of Session    OT Visit Diagnosis: Muscle weakness (generalized) (M62.81);Hemiplegia and hemiparesis;Other abnormalities of gait and mobility (R26.89) Hemiplegia - Right/Left: Left Hemiplegia - dominant/non-dominant: Non-Dominant Hemiplegia - caused by: Cerebral infarction   Activity Tolerance Patient tolerated treatment well   Patient Left in bed;with call bell/phone within reach;with  bed alarm set   Nurse Communication          Time: 3888-2800 OT Time Calculation (min): 24 min  Charges: OT General Charges $OT Visit: 1 Visit OT Treatments $Self Care/Home Management : 8-22 mins $Therapeutic Activity: 8-22 mins     [name] 04/04/2018, 12:21 PM

## 2018-04-04 NOTE — Clinical Social Work Placement (Signed)
   CLINICAL SOCIAL WORK PLACEMENT  NOTE  Date:  04/04/2018  Patient Details  Name: Derek Hays MRN: [id number] Date of Birth: [birth year]  Clinical Social Work is seeking post-discharge placement for this patient at the [place] level of care (*CSW will initial, date and re-position this form in  chart as items are completed):      Patient/family provided with [place] Work Department's list of facilities offering this level of care within the geographic area requested by the patient (or if unable, by the patient's family).  Yes   Patient/family informed of their freedom to choose among providers that offer the needed level of care, that participate in Medicare, Medicaid or managed care program needed by the patient, have an available bed and are willing to accept the patient.      Patient/family informed of [place]'s ownership interest in [facility] and [facility], as well as of the fact that they are under no obligation to receive care at these facilities.  PASRR submitted to EDS on       PASRR number received on 04/02/18     Existing PASRR number confirmed on       FL2 transmitted to all facilities in geographic area requested by pt/family on 04/02/18     FL2 transmitted to all facilities within larger geographic area on       Patient informed that his/her managed care company has contracts with or will negotiate with certain facilities, including the following:        Yes   Patient/family informed of bed offers received.  Patient chooses bed at [facility])     Physician recommends and patient chooses bed at      Patient to be transferred to [facility]) on 04/04/18.  Patient to be transferred to facility by [facility]     Patient family notified on 04/04/18 of transfer.  Name of family member notified:  spouse, [facility]     PHYSICIAN       Additional Comment:     _______________________________________________ Eileen Stanford, LCSW 04/04/2018, 10:18 AM

## 2018-04-05 LAB — CULTURE, BLOOD (ROUTINE X 2)
CULTURE: NO GROWTH
Culture: NO GROWTH
Special Requests: ADEQUATE

## 2018-04-17 DIAGNOSIS — J189 Pneumonia, unspecified organism: Secondary | ICD-10-CM | POA: Diagnosis not present

## 2018-04-17 DIAGNOSIS — G9340 Encephalopathy, unspecified: Secondary | ICD-10-CM | POA: Diagnosis not present

## 2018-04-17 DIAGNOSIS — J90 Pleural effusion, not elsewhere classified: Secondary | ICD-10-CM | POA: Diagnosis not present

## 2018-04-17 DIAGNOSIS — N390 Urinary tract infection, site not specified: Secondary | ICD-10-CM | POA: Diagnosis not present

## 2018-04-23 ENCOUNTER — Ambulatory Visit: Payer: Medicare Other | Admitting: Adult Health

## 2018-04-28 DIAGNOSIS — J189 Pneumonia, unspecified organism: Secondary | ICD-10-CM | POA: Diagnosis not present

## 2018-04-28 DIAGNOSIS — J90 Pleural effusion, not elsewhere classified: Secondary | ICD-10-CM | POA: Diagnosis not present

## 2018-04-28 DIAGNOSIS — I5032 Chronic diastolic (congestive) heart failure: Secondary | ICD-10-CM | POA: Diagnosis not present

## 2018-05-02 DIAGNOSIS — J189 Pneumonia, unspecified organism: Secondary | ICD-10-CM | POA: Diagnosis not present

## 2018-05-02 DIAGNOSIS — I480 Paroxysmal atrial fibrillation: Secondary | ICD-10-CM | POA: Diagnosis not present

## 2018-05-02 DIAGNOSIS — I10 Essential (primary) hypertension: Secondary | ICD-10-CM | POA: Diagnosis not present

## 2018-05-02 DIAGNOSIS — I5032 Chronic diastolic (congestive) heart failure: Secondary | ICD-10-CM | POA: Diagnosis not present

## 2018-05-12 ENCOUNTER — Encounter (HOSPITAL_COMMUNITY): Payer: Self-pay

## 2018-05-12 ENCOUNTER — Inpatient Hospital Stay (HOSPITAL_COMMUNITY)
Admission: EM | Admit: 2018-05-12 | Discharge: 2018-05-24 | DRG: 177 | Disposition: A | Discharge status: Home Health | Payer: Medicare Other | Attending: Internal Medicine | Admitting: Internal Medicine

## 2018-05-12 ENCOUNTER — Other Ambulatory Visit: Payer: Self-pay

## 2018-05-12 ENCOUNTER — Emergency Department (HOSPITAL_COMMUNITY): Payer: Medicare Other

## 2018-05-12 DIAGNOSIS — N183 Chronic kidney disease, stage 3 unspecified: Secondary | ICD-10-CM | POA: Diagnosis present

## 2018-05-12 DIAGNOSIS — I130 Hypertensive heart and chronic kidney disease with heart failure and stage 1 through stage 4 chronic kidney disease, or unspecified chronic kidney disease: Secondary | ICD-10-CM | POA: Diagnosis not present

## 2018-05-12 DIAGNOSIS — Z7901 Long term (current) use of anticoagulants: Secondary | ICD-10-CM

## 2018-05-12 DIAGNOSIS — I482 Chronic atrial fibrillation, unspecified: Secondary | ICD-10-CM | POA: Diagnosis not present

## 2018-05-12 DIAGNOSIS — I69391 Dysphagia following cerebral infarction: Secondary | ICD-10-CM

## 2018-05-12 DIAGNOSIS — G9341 Metabolic encephalopathy: Secondary | ICD-10-CM | POA: Diagnosis present

## 2018-05-12 DIAGNOSIS — I480 Paroxysmal atrial fibrillation: Secondary | ICD-10-CM | POA: Diagnosis present

## 2018-05-12 DIAGNOSIS — Z7189 Other specified counseling: Secondary | ICD-10-CM

## 2018-05-12 DIAGNOSIS — H9190 Unspecified hearing loss, unspecified ear: Secondary | ICD-10-CM | POA: Diagnosis present

## 2018-05-12 DIAGNOSIS — I69354 Hemiplegia and hemiparesis following cerebral infarction affecting left non-dominant side: Secondary | ICD-10-CM

## 2018-05-12 DIAGNOSIS — I5032 Chronic diastolic (congestive) heart failure: Secondary | ICD-10-CM | POA: Diagnosis not present

## 2018-05-12 DIAGNOSIS — E890 Postprocedural hypothyroidism: Secondary | ICD-10-CM | POA: Diagnosis present

## 2018-05-12 DIAGNOSIS — Z950 Presence of cardiac pacemaker: Secondary | ICD-10-CM

## 2018-05-12 DIAGNOSIS — R5383 Other fatigue: Secondary | ICD-10-CM | POA: Diagnosis not present

## 2018-05-12 DIAGNOSIS — Z515 Encounter for palliative care: Secondary | ICD-10-CM

## 2018-05-12 DIAGNOSIS — Z8551 Personal history of malignant neoplasm of bladder: Secondary | ICD-10-CM

## 2018-05-12 DIAGNOSIS — R404 Transient alteration of awareness: Secondary | ICD-10-CM | POA: Diagnosis not present

## 2018-05-12 DIAGNOSIS — J189 Pneumonia, unspecified organism: Secondary | ICD-10-CM | POA: Diagnosis present

## 2018-05-12 DIAGNOSIS — I10 Essential (primary) hypertension: Secondary | ICD-10-CM | POA: Diagnosis not present

## 2018-05-12 DIAGNOSIS — C220 Liver cell carcinoma: Secondary | ICD-10-CM | POA: Diagnosis present

## 2018-05-12 DIAGNOSIS — R1111 Vomiting without nausea: Secondary | ICD-10-CM | POA: Diagnosis not present

## 2018-05-12 DIAGNOSIS — Z7401 Bed confinement status: Secondary | ICD-10-CM

## 2018-05-12 DIAGNOSIS — Z825 Family history of asthma and other chronic lower respiratory diseases: Secondary | ICD-10-CM

## 2018-05-12 DIAGNOSIS — J181 Lobar pneumonia, unspecified organism: Secondary | ICD-10-CM | POA: Diagnosis not present

## 2018-05-12 DIAGNOSIS — Z79890 Hormone replacement therapy (postmenopausal): Secondary | ICD-10-CM

## 2018-05-12 DIAGNOSIS — R0902 Hypoxemia: Secondary | ICD-10-CM | POA: Diagnosis not present

## 2018-05-12 DIAGNOSIS — J690 Pneumonitis due to inhalation of food and vomit: Principal | ICD-10-CM | POA: Diagnosis present

## 2018-05-12 DIAGNOSIS — H109 Unspecified conjunctivitis: Secondary | ICD-10-CM | POA: Diagnosis present

## 2018-05-12 DIAGNOSIS — R160 Hepatomegaly, not elsewhere classified: Secondary | ICD-10-CM

## 2018-05-12 DIAGNOSIS — G629 Polyneuropathy, unspecified: Secondary | ICD-10-CM | POA: Diagnosis present

## 2018-05-12 DIAGNOSIS — R410 Disorientation, unspecified: Secondary | ICD-10-CM | POA: Diagnosis not present

## 2018-05-12 DIAGNOSIS — R1312 Dysphagia, oropharyngeal phase: Secondary | ICD-10-CM | POA: Diagnosis present

## 2018-05-12 DIAGNOSIS — Z87891 Personal history of nicotine dependence: Secondary | ICD-10-CM

## 2018-05-12 DIAGNOSIS — Z79899 Other long term (current) drug therapy: Secondary | ICD-10-CM

## 2018-05-12 LAB — COMPREHENSIVE METABOLIC PANEL
ALT: 19 U/L (ref 0–44)
AST: 35 U/L (ref 15–41)
Albumin: 3.2 g/dL — ABNORMAL LOW (ref 3.5–5.0)
Alkaline Phosphatase: 284 U/L — ABNORMAL HIGH (ref 38–126)
Anion gap: 10 (ref 5–15)
BUN: 29 mg/dL — ABNORMAL HIGH (ref 8–23)
CO2: 24 mmol/L (ref 22–32)
Calcium: 9 mg/dL (ref 8.9–10.3)
Chloride: 106 mmol/L (ref 98–111)
Creatinine, Ser: 1.45 mg/dL — ABNORMAL HIGH (ref 0.61–1.24)
GFR calc Af Amer: 51 mL/min — ABNORMAL LOW (ref >60)
GFR calc non Af Amer: 44 mL/min — ABNORMAL LOW (ref >60)
Glucose, Bld: 98 mg/dL (ref 70–99)
Potassium: 4.5 mmol/L (ref 3.5–5.1)
Sodium: 140 mmol/L (ref 135–145)
Total Bilirubin: 1 mg/dL (ref 0.3–1.2)
Total Protein: 6.7 g/dL (ref 6.5–8.1)

## 2018-05-12 LAB — CBC WITH DIFFERENTIAL/PLATELET
Abs Immature Granulocytes: 0.03 10*3/uL (ref 0.00–0.07)
BASOS PCT: 1 %
Basophils Absolute: 0.1 10*3/uL (ref 0.0–0.1)
Eosinophils Absolute: 0.2 10*3/uL (ref 0.0–0.5)
Eosinophils Relative: 2 %
HCT: 44.7 % (ref 39.0–52.0)
Hemoglobin: 14.4 g/dL (ref 13.0–17.0)
Immature Granulocytes: 0 %
Lymphocytes Relative: 16 %
Lymphs Abs: 1.3 10*3/uL (ref 0.7–4.0)
MCH: 29.4 pg (ref 26.0–34.0)
MCHC: 32.2 g/dL (ref 30.0–36.0)
MCV: 91.4 fL (ref 80.0–100.0)
Monocytes Absolute: 0.6 10*3/uL (ref 0.1–1.0)
Monocytes Relative: 7 %
Neutro Abs: 6.3 10*3/uL (ref 1.7–7.7)
Neutrophils Relative %: 74 %
Platelets: 179 10*3/uL (ref 150–400)
RBC: 4.89 MIL/uL (ref 4.22–5.81)
RDW: 15.9 % — ABNORMAL HIGH (ref 11.5–15.5)
WBC: 8.5 10*3/uL (ref 4.0–10.5)
nRBC: 0 % (ref 0.0–0.2)

## 2018-05-12 LAB — URINALYSIS, ROUTINE W REFLEX MICROSCOPIC
Bacteria, UA: NONE SEEN
Bilirubin Urine: NEGATIVE
Glucose, UA: NEGATIVE mg/dL
Hgb urine dipstick: NEGATIVE
Ketones, ur: NEGATIVE mg/dL
Nitrite: NEGATIVE
PH: 5 (ref 5.0–8.0)
Protein, ur: NEGATIVE mg/dL
Specific Gravity, Urine: 1.013 (ref 1.005–1.030)

## 2018-05-12 LAB — TROPONIN I: Troponin I: <0.03 ng/mL (ref <0.03)

## 2018-05-12 LAB — CBG MONITORING, ED: Glucose-Capillary: 77 mg/dL (ref 70–99)

## 2018-05-12 LAB — BRAIN NATRIURETIC PEPTIDE: B NATRIURETIC PEPTIDE 5: 201.9 pg/mL — AB (ref 0.0–100.0)

## 2018-05-12 LAB — LACTIC ACID, PLASMA: Lactic Acid, Venous: 1.6 mmol/L (ref 0.5–1.9)

## 2018-05-12 MED ORDER — SODIUM CHLORIDE 0.9 % IV SOLN
1.0000 g | Freq: Once | INTRAVENOUS | Status: DC
  Filled 2018-05-12: qty 1

## 2018-05-12 MED ORDER — SODIUM CHLORIDE 0.9 % IV SOLN
2.0000 g | Freq: Once | INTRAVENOUS | Status: AC
  Administered 2018-05-12: 2 g via INTRAVENOUS
  Filled 2018-05-12: qty 2

## 2018-05-12 MED ORDER — VANCOMYCIN HCL IN DEXTROSE 1-5 GM/200ML-% IV SOLN
1000.0000 mg | Freq: Once | INTRAVENOUS | Status: AC
  Administered 2018-05-12: 1000 mg via INTRAVENOUS
  Filled 2018-05-12: qty 200

## 2018-05-12 NOTE — ED Notes (Signed)
Pt transported to xray 

## 2018-05-12 NOTE — Progress Notes (Signed)
A consult was received from an ED physician for Vancomycin per pharmacy dosing (for an indication other than meningitis). The patient's profile has been reviewed for ht/wt/allergies/indication/available labs. A one time order has been placed for the above antibiotics.  Further antibiotics/pharmacy consults should be ordered by admitting physician if indicated.                       Reuel Boom, PharmD, BCPS [phone] 05/12/2018, 9:42 PM

## 2018-05-12 NOTE — ED Notes (Signed)
Pt back from radiology 

## 2018-05-12 NOTE — ED Triage Notes (Addendum)
Per ems: pt coming from home c/o productive cough with green sputum x3 day ago, emesis x2 today, and increased lethargy. Hx of pneumonia in Dec. A&Ox4.    400 mL NS given by EMS. Hx of CHF

## 2018-05-12 NOTE — ED Provider Notes (Signed)
[facility] DEPT Provider Note   CSN: [id number] Arrival date & time: 05/12/18  1909     History   Chief Complaint Chief Complaint  Patient presents with  . Fatigue    HPI Derek Hays is a 85 y.o. male.  HPI  85 year old male presents from home by EMS.  History is mostly taken from the nurse who took report from EMS.  Patient has been having cough for about 3 days and progressive fatigue/lethargy.  He has also seemed confused to family.  No known fevers.  The patient tells me he has been having a cough but otherwise feels okay.  He states he is not sure why he is here except that the home health nurse told him he needed to go to the ER.  He has chronic left-sided weakness from a prior stroke.  Past Medical History:  Diagnosis Date  . Atrial fibrillation ([place])   . Bladder cancer ([place]) 1984  . Hypertension   . Stroke [facility])     Patient Active Problem List   Diagnosis Date Noted  . Pneumonia 05/12/2018  . Urinary retention 04/02/2018  . CAP (community acquired pneumonia) 12020/02/1118  . Permanent atrial fibrillation   . SIRS (systemic inflammatory response syndrome) ([place]) 11/05/2017  . Urinary tract infection 09/23/2017  . Acute respiratory failure with hypoxia ([place]) 09/23/2017  . Oropharyngeal dysphagia 09/23/2017  . Palliative care by specialist   . Sepsis due to urinary tract infection ([place])   . HCAP (healthcare-associated pneumonia)   . Altered mental status 08/10/2017  . Acute blood loss anemia   . Stage 3 chronic kidney disease ([place])   . PAF (paroxysmal atrial fibrillation) ([place])   . Benign essential HTN   . Dysphagia, post-stroke   . Right middle cerebral artery stroke ([place]) 05/30/2017  . 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 Stroke (cerebrum) ([place]) 05/27/2017    Past Surgical History:  Procedure Laterality Date  . IR CT HEAD LTD  05/27/2017  . IR PERCUTANEOUS ART THROMBECTOMY/INFUSION INTRACRANIAL INC DIAG  ANGIO  05/27/2017  . PACEMAKER INSERTION    . RADIOLOGY WITH ANESTHESIA N/A 05/27/2017   Procedure: RADIOLOGY WITH ANESTHESIA;  Surgeon: Luanne Bras, MD;  Location: [place];  Service: Radiology;  Laterality: N/A;        Home Medications    Prior to Admission medications   Medication Sig Start Date End Date Taking? Authorizing Provider  albuterol (PROVENTIL) (2.5 MG/3ML) 0.083% nebulizer solution Take 3 mLs (2.5 mg total) by nebulization every 6 (six) hours. Patient taking differently: Take 2.5 mg by nebulization every 6 (six) hours as needed for wheezing or shortness of breath.  08/13/17  Yes Rory Percy, DO  apixaban (ELIQUIS) 5 MG TABS tablet Take 1 tablet (5 mg total) by mouth 2 (two) times daily. 06/19/17  Yes Angiulli, Lavon Paganini, PA-C  bethanechol (URECHOLINE) 25 MG tablet Take 1 tablet (25 mg total) by mouth 3 (three) times daily. Patient taking differently: Take 25 mg by mouth 3 (three) times daily. 7:30am, 5pm, 9pm 06/19/17  Yes Angiulli, Lavon Paganini, PA-C  Cholecalciferol (VITAMIN D) 50 MCG (2000 UT) tablet Take 2,000 Units by mouth daily.   Yes [provider]  folic acid (FOLVITE) 1 MG tablet Take 1 mg by mouth daily.   Yes [provider]  furosemide (LASIX) 20 MG tablet Take 20 mg by mouth daily as needed for fluid.   Yes [provider]  levothyroxine (SYNTHROID, LEVOTHROID) 137 MCG tablet Take  137 mcg by mouth daily before breakfast.   Yes [provider]  pregabalin (LYRICA) 200 MG capsule Take 200 mg by mouth 2 (two) times daily.   Yes [provider]  tamsulosin (FLOMAX) 0.4 MG CAPS capsule Take 1 capsule (0.4 mg total) by mouth daily after supper. Patient taking differently: Take 0.4 mg by mouth at bedtime.  06/19/17  Yes Angiulli, Lavon Paganini, PA-C  traZODone (DESYREL) 100 MG tablet Take 200 mg by mouth at bedtime.   Yes [provider]  amLODipine (NORVASC) 10 MG tablet Take 1 tablet (10 mg total) by mouth daily. Patient  not taking: Reported on 04/01/2018 08/14/17   Rory Percy, DO  atorvastatin (LIPITOR) 40 MG tablet Take 1 tablet (40 mg total) by mouth daily at 6 PM. Patient not taking: Reported on 05/12/2018 08/13/17   Rory Percy, DO  carboxymethylcellulose (REFRESH TEARS) 0.5 % SOLN Place 1 drop into both eyes every 6 (six) hours as needed (dry eyes).    [provider]  latanoprost (XALATAN) 0.005 % ophthalmic solution Place 1 drop into both eyes at bedtime.    [provider]    Family History Family History  Problem Relation Age of Onset  . Emphysema Father     Social History Social History   Tobacco Use  . Smoking status: Former Smoker    Packs/day: 1.50    Years: 25.00    Pack years: 37.50    Types: Cigarettes    Last attempt to quit: 06/12/1982    Years since quitting: 35.9  . Smokeless tobacco: Never Used  . Tobacco comment: pt states he quit when dx with bladder ca  Substance Use Topics  . Alcohol use: Not Currently  . Drug use: No     Allergies   Patient has no known allergies.   Review of Systems Review of Systems  Constitutional: Positive for fatigue. Negative for fever.  Respiratory: Positive for cough. Negative for shortness of breath.   Gastrointestinal: Negative for vomiting.  All other systems reviewed and are negative.    Physical Exam Updated Vital Signs BP (!) 145/50   Pulse (!) 59   Temp 98.1 F (36.7 C) (Rectal)   Resp 13   Ht 6' (1.829 m)   Wt 87.1 kg   SpO2 98%   BMI 26.04 kg/m   Physical Exam Vitals signs and nursing note reviewed.  Constitutional:      Appearance: He is well-developed.  HENT:     Head: Normocephalic and atraumatic.     Right Ear: External ear normal.     Left Ear: External ear normal.     Nose: Nose normal.  Eyes:     General:        Right eye: No discharge.        Left eye: No discharge.     Comments: Left eye with injected conjunctiva.  Neck:     Musculoskeletal: Neck supple.    Cardiovascular:     Rate and Rhythm: Normal rate and regular rhythm.     Heart sounds: Normal heart sounds.  Pulmonary:     Effort: Pulmonary effort is normal.     Breath sounds: Rales present.  Abdominal:     General: There is no distension.     Palpations: Abdomen is soft.     Tenderness: There is no abdominal tenderness.  Skin:    General: Skin is warm and dry.  Neurological:     Mental Status: He is alert and  oriented to person, place, and time.     Comments: Left sided weakness  Psychiatric:        Mood and Affect: Mood is not anxious.      ED Treatments / Results  Labs (all labs ordered are listed, but only abnormal results are displayed) Labs Reviewed  BRAIN NATRIURETIC PEPTIDE - Abnormal; Notable for the following components:      Result Value   B Natriuretic Peptide 201.9 (*)    All other components within normal limits  CBC WITH DIFFERENTIAL/PLATELET - Abnormal; Notable for the following components:   RDW 15.9 (*)    All other components within normal limits  URINALYSIS, ROUTINE W REFLEX MICROSCOPIC - Abnormal; Notable for the following components:   APPearance HAZY (*)    Leukocytes, UA TRACE (*)    All other components within normal limits  COMPREHENSIVE METABOLIC PANEL - Abnormal; Notable for the following components:   BUN 29 (*)    Creatinine, Ser 1.45 (*)    Albumin 3.2 (*)    Alkaline Phosphatase 284 (*)    GFR calc non Af Amer 44 (*)    GFR calc Af Amer 51 (*)    All other components within normal limits  URINE CULTURE  CULTURE, BLOOD (ROUTINE X 2)  CULTURE, BLOOD (ROUTINE X 2)  LACTIC ACID, PLASMA  TROPONIN I  CBG MONITORING, ED    EKG EKG Interpretation  Date/Time:  Monday May 12 2018 19:23:43 EST Ventricular Rate:  72 PR Interval:    QRS Duration: 155 QT Interval:  460 QTC Calculation: 504 R Axis:   -93 Text Interpretation:  Ventricular-paced complexes No further analysis attempted due to paced rhythm no significant change since  July 2019 Confirmed by Sherwood Gambler [phone]) on 05/12/2018 8:20:06 PM   Radiology Dg Chest 2 View  Result Date: 05/12/2018 CLINICAL DATA:  Productive cough. EXAM: CHEST - 2 VIEW COMPARISON:  Chest x-ray dated March 31, 2018. FINDINGS: Unchanged left chest wall pacemaker. Stable cardiomediastinal silhouette. Normal pulmonary vascularity. Patchy opacity in the right middle lobe. Unchanged small right pleural effusion. The left lung is clear. No pneumothorax. No acute osseous abnormality. IMPRESSION: 1. Right middle lobe pneumonia. 2. Unchanged small right pleural effusion. Electronically Signed   By: Titus Dubin M.D.   On: 05/12/2018 20:45    Procedures Procedures (including critical care time)  Medications Ordered in ED Medications  vancomycin (VANCOCIN) IVPB 1000 mg/200 mL premix (has no administration in time range)  ceFEPIme (MAXIPIME) 2 g in sodium chloride 0.9 % 100 mL IVPB (0 g Intravenous Stopped 05/12/18 2315)     Initial Impression / Assessment and Plan / ED Course  I have reviewed the triage vital signs and the nursing notes.  Pertinent labs & imaging results that were available during my care of the patient were reviewed by me and considered in my medical decision making (see chart for details).     Chest x-ray confirms pneumonia.  While he has a small bump in his creatinine, otherwise his labs are overall near baseline.  However with his age, report of vomiting at home from the home health nurse, and decreased p.o. intake combined with his comorbidities I think he will need admission.  Dr. Hal Hope to admit.  Final Clinical Impressions(s) / ED Diagnoses   Final diagnoses:  HCAP (healthcare-associated pneumonia)    ED Discharge Orders    None       Sherwood Gambler, MD 05/12/18 2332

## 2018-05-12 NOTE — ED Notes (Signed)
Pt aware that urine sample is needed.  

## 2018-05-12 NOTE — ED Notes (Signed)
Bed: WA09 Expected date:  Expected time:  Means of arrival:  Comments: EMS 80 M pneumonia and lethargic

## 2018-05-13 ENCOUNTER — Encounter (HOSPITAL_COMMUNITY): Payer: Self-pay | Admitting: Internal Medicine

## 2018-05-13 ENCOUNTER — Observation Stay (HOSPITAL_COMMUNITY): Payer: Medicare Other

## 2018-05-13 DIAGNOSIS — R160 Hepatomegaly, not elsewhere classified: Secondary | ICD-10-CM | POA: Diagnosis not present

## 2018-05-13 DIAGNOSIS — I10 Essential (primary) hypertension: Secondary | ICD-10-CM | POA: Diagnosis not present

## 2018-05-13 DIAGNOSIS — H109 Unspecified conjunctivitis: Secondary | ICD-10-CM | POA: Diagnosis present

## 2018-05-13 DIAGNOSIS — R509 Fever, unspecified: Secondary | ICD-10-CM | POA: Diagnosis not present

## 2018-05-13 DIAGNOSIS — Z7401 Bed confinement status: Secondary | ICD-10-CM | POA: Diagnosis not present

## 2018-05-13 DIAGNOSIS — J189 Pneumonia, unspecified organism: Secondary | ICD-10-CM | POA: Diagnosis present

## 2018-05-13 DIAGNOSIS — N183 Chronic kidney disease, stage 3 (moderate): Secondary | ICD-10-CM

## 2018-05-13 DIAGNOSIS — Z79890 Hormone replacement therapy (postmenopausal): Secondary | ICD-10-CM | POA: Diagnosis not present

## 2018-05-13 DIAGNOSIS — Z7901 Long term (current) use of anticoagulants: Secondary | ICD-10-CM | POA: Diagnosis not present

## 2018-05-13 DIAGNOSIS — E890 Postprocedural hypothyroidism: Secondary | ICD-10-CM | POA: Diagnosis present

## 2018-05-13 DIAGNOSIS — Z7189 Other specified counseling: Secondary | ICD-10-CM | POA: Diagnosis not present

## 2018-05-13 DIAGNOSIS — G629 Polyneuropathy, unspecified: Secondary | ICD-10-CM | POA: Diagnosis present

## 2018-05-13 DIAGNOSIS — I480 Paroxysmal atrial fibrillation: Secondary | ICD-10-CM

## 2018-05-13 DIAGNOSIS — Z515 Encounter for palliative care: Secondary | ICD-10-CM | POA: Diagnosis not present

## 2018-05-13 DIAGNOSIS — Z8551 Personal history of malignant neoplasm of bladder: Secondary | ICD-10-CM | POA: Diagnosis not present

## 2018-05-13 DIAGNOSIS — Z87891 Personal history of nicotine dependence: Secondary | ICD-10-CM | POA: Diagnosis not present

## 2018-05-13 DIAGNOSIS — Z79899 Other long term (current) drug therapy: Secondary | ICD-10-CM | POA: Diagnosis not present

## 2018-05-13 DIAGNOSIS — J690 Pneumonitis due to inhalation of food and vomit: Secondary | ICD-10-CM | POA: Diagnosis present

## 2018-05-13 DIAGNOSIS — I130 Hypertensive heart and chronic kidney disease with heart failure and stage 1 through stage 4 chronic kidney disease, or unspecified chronic kidney disease: Secondary | ICD-10-CM | POA: Diagnosis present

## 2018-05-13 DIAGNOSIS — I5032 Chronic diastolic (congestive) heart failure: Secondary | ICD-10-CM | POA: Diagnosis present

## 2018-05-13 DIAGNOSIS — R05 Cough: Secondary | ICD-10-CM | POA: Diagnosis not present

## 2018-05-13 DIAGNOSIS — H9190 Unspecified hearing loss, unspecified ear: Secondary | ICD-10-CM | POA: Diagnosis present

## 2018-05-13 DIAGNOSIS — G9341 Metabolic encephalopathy: Secondary | ICD-10-CM | POA: Diagnosis present

## 2018-05-13 DIAGNOSIS — C220 Liver cell carcinoma: Secondary | ICD-10-CM | POA: Diagnosis present

## 2018-05-13 DIAGNOSIS — G8194 Hemiplegia, unspecified affecting left nondominant side: Secondary | ICD-10-CM | POA: Diagnosis not present

## 2018-05-13 DIAGNOSIS — I959 Hypotension, unspecified: Secondary | ICD-10-CM | POA: Diagnosis not present

## 2018-05-13 DIAGNOSIS — Z825 Family history of asthma and other chronic lower respiratory diseases: Secondary | ICD-10-CM | POA: Diagnosis not present

## 2018-05-13 DIAGNOSIS — M2550 Pain in unspecified joint: Secondary | ICD-10-CM | POA: Diagnosis not present

## 2018-05-13 DIAGNOSIS — R1312 Dysphagia, oropharyngeal phase: Secondary | ICD-10-CM | POA: Diagnosis present

## 2018-05-13 DIAGNOSIS — I482 Chronic atrial fibrillation, unspecified: Secondary | ICD-10-CM | POA: Diagnosis present

## 2018-05-13 DIAGNOSIS — K7689 Other specified diseases of liver: Secondary | ICD-10-CM | POA: Diagnosis not present

## 2018-05-13 DIAGNOSIS — I69354 Hemiplegia and hemiparesis following cerebral infarction affecting left non-dominant side: Secondary | ICD-10-CM | POA: Diagnosis not present

## 2018-05-13 DIAGNOSIS — I69391 Dysphagia following cerebral infarction: Secondary | ICD-10-CM | POA: Diagnosis not present

## 2018-05-13 DIAGNOSIS — Z950 Presence of cardiac pacemaker: Secondary | ICD-10-CM | POA: Diagnosis not present

## 2018-05-13 LAB — CBC
HCT: 42.5 % (ref 39.0–52.0)
Hemoglobin: 13.7 g/dL (ref 13.0–17.0)
MCH: 29.1 pg (ref 26.0–34.0)
MCHC: 32.2 g/dL (ref 30.0–36.0)
MCV: 90.4 fL (ref 80.0–100.0)
Platelets: 173 10*3/uL (ref 150–400)
RBC: 4.7 MIL/uL (ref 4.22–5.81)
RDW: 15.6 % — ABNORMAL HIGH (ref 11.5–15.5)
WBC: 8.4 10*3/uL (ref 4.0–10.5)
nRBC: 0 % (ref 0.0–0.2)

## 2018-05-13 LAB — BASIC METABOLIC PANEL
Anion gap: 10 (ref 5–15)
BUN: 29 mg/dL — AB (ref 8–23)
CO2: 24 mmol/L (ref 22–32)
CREATININE: 1.35 mg/dL — AB (ref 0.61–1.24)
Calcium: 9.1 mg/dL (ref 8.9–10.3)
Chloride: 106 mmol/L (ref 98–111)
GFR calc Af Amer: 55 mL/min — ABNORMAL LOW (ref >60)
GFR calc non Af Amer: 48 mL/min — ABNORMAL LOW (ref >60)
Glucose, Bld: 100 mg/dL — ABNORMAL HIGH (ref 70–99)
POTASSIUM: 4 mmol/L (ref 3.5–5.1)
Sodium: 140 mmol/L (ref 135–145)

## 2018-05-13 LAB — MRSA PCR SCREENING: MRSA by PCR: POSITIVE — AB

## 2018-05-13 MED ORDER — ACETAMINOPHEN 325 MG PO TABS
650.0000 mg | ORAL_TABLET | Freq: Four times a day (QID) | ORAL | Status: DC | PRN
  Administered 2018-05-13 – 2018-05-20 (×6): 650 mg via ORAL
  Filled 2018-05-13 (×6): qty 2

## 2018-05-13 MED ORDER — POLYVINYL ALCOHOL 1.4 % OP SOLN
1.0000 [drp] | Freq: Four times a day (QID) | OPHTHALMIC | Status: DC | PRN
  Filled 2018-05-13: qty 15

## 2018-05-13 MED ORDER — APIXABAN 5 MG PO TABS
5.0000 mg | ORAL_TABLET | Freq: Two times a day (BID) | ORAL | Status: DC
  Administered 2018-05-13 – 2018-05-21 (×16): 5 mg via ORAL
  Filled 2018-05-13 (×17): qty 1

## 2018-05-13 MED ORDER — FOLIC ACID 1 MG PO TABS
1.0000 mg | ORAL_TABLET | Freq: Every day | ORAL | Status: DC
  Administered 2018-05-14 – 2018-05-24 (×10): 1 mg via ORAL
  Filled 2018-05-13 (×11): qty 1

## 2018-05-13 MED ORDER — ALBUTEROL SULFATE (2.5 MG/3ML) 0.083% IN NEBU: 2.5000 mg | INHALATION_SOLUTION | Freq: Four times a day (QID) | RESPIRATORY_TRACT | Status: DC | PRN

## 2018-05-13 MED ORDER — VANCOMYCIN HCL 10 G IV SOLR
1250.0000 mg | INTRAVENOUS | Status: DC
  Administered 2018-05-14 – 2018-05-16 (×3): 1250 mg via INTRAVENOUS
  Filled 2018-05-13 (×4): qty 1250

## 2018-05-13 MED ORDER — ACETAMINOPHEN 650 MG RE SUPP: 650.0000 mg | Freq: Four times a day (QID) | RECTAL | Status: DC | PRN

## 2018-05-13 MED ORDER — ONDANSETRON HCL 4 MG PO TABS: 4.0000 mg | ORAL_TABLET | Freq: Four times a day (QID) | ORAL | Status: DC | PRN

## 2018-05-13 MED ORDER — PREGABALIN 50 MG PO CAPS
200.0000 mg | ORAL_CAPSULE | Freq: Two times a day (BID) | ORAL | Status: DC
  Administered 2018-05-13: 200 mg via ORAL
  Filled 2018-05-13 (×2): qty 4

## 2018-05-13 MED ORDER — VITAMIN D 25 MCG (1000 UNIT) PO TABS
2000.0000 [IU] | ORAL_TABLET | Freq: Every day | ORAL | Status: DC
  Administered 2018-05-14 – 2018-05-24 (×10): 2000 [IU] via ORAL
  Filled 2018-05-13 (×11): qty 2

## 2018-05-13 MED ORDER — TAMSULOSIN HCL 0.4 MG PO CAPS
0.4000 mg | ORAL_CAPSULE | Freq: Every day | ORAL | Status: DC
  Administered 2018-05-13 – 2018-05-23 (×11): 0.4 mg via ORAL
  Filled 2018-05-13 (×11): qty 1

## 2018-05-13 MED ORDER — TRAZODONE HCL 100 MG PO TABS: 200.0000 mg | ORAL_TABLET | Freq: Every day | ORAL | Status: DC

## 2018-05-13 MED ORDER — LATANOPROST 0.005 % OP SOLN
1.0000 [drp] | Freq: Every day | OPHTHALMIC | Status: DC
  Administered 2018-05-13 – 2018-05-23 (×11): 1 [drp] via OPHTHALMIC
  Filled 2018-05-13: qty 2.5

## 2018-05-13 MED ORDER — ONDANSETRON HCL 4 MG/2ML IJ SOLN: 4.0000 mg | Freq: Four times a day (QID) | INTRAMUSCULAR | Status: DC | PRN

## 2018-05-13 MED ORDER — BETHANECHOL CHLORIDE 25 MG PO TABS
25.0000 mg | ORAL_TABLET | ORAL | Status: DC
  Administered 2018-05-13 – 2018-05-24 (×31): 25 mg via ORAL
  Filled 2018-05-13 (×36): qty 1

## 2018-05-13 MED ORDER — LEVOTHYROXINE SODIUM 25 MCG PO TABS
137.0000 ug | ORAL_TABLET | Freq: Every day | ORAL | Status: DC
  Administered 2018-05-13 – 2018-05-24 (×11): 137 ug via ORAL
  Filled 2018-05-13 (×10): qty 1

## 2018-05-13 MED ORDER — PIPERACILLIN-TAZOBACTAM 3.375 G IVPB
3.3750 g | Freq: Three times a day (TID) | INTRAVENOUS | Status: DC
  Administered 2018-05-13 – 2018-05-14 (×5): 3.375 g via INTRAVENOUS
  Filled 2018-05-13 (×6): qty 50

## 2018-05-13 NOTE — Progress Notes (Signed)
Pharmacy Antibiotic Note  Derek Hays is a 85 y.o. male admitted on 05/12/2018 with pneumonia.  Pharmacy has been consulted for Vancomycin and Zosyn dosing.  Plan:  Vancomycin 1gm iv x1, then Vancomycin 1250 mg IV Q 24 hrs. Goal AUC 400-550. Expected AUC: 488 SCr used: 1.35  Cefepime 2gm iv x1, then d/c Zosyn 3.375g IV Q8H infused over 4hrs.    Height: 6' (182.9 cm) Weight: 192 lb (87.1 kg) IBW/kg (Calculated) : 77.6  Temp (24hrs), Avg:98 F (36.7 C), Min:97.9 F (36.6 C), Max:98.1 F (36.7 C)  Recent Labs  Lab 05/12/18 2015 05/12/18 2100 05/13/18 0359  WBC 8.5  --  8.4  CREATININE  --  1.45* 1.35*  LATICACIDVEN 1.6  --   --     Estimated Creatinine Clearance: 43.9 mL/min (A) (by C-G formula based on SCr of 1.35 mg/dL (H)).    No Known Allergies  Antimicrobials this admission: Vancomycin 05/12/2018 >> Cefepime 05/12/2018 x1 Zosyn 05/13/2018 >>  Dose adjustments this admission: -  Microbiology results: -  Thank you for allowing pharmacy to be a part of this patient's care.  [name] 05/13/2018 5:14 AM

## 2018-05-13 NOTE — Progress Notes (Signed)
SLP clarified order for MBS and placed xray portion of order in cache.  Note pt has been lethargic per RN documentation thus will see pt at bedside to assure appropriate for MBS.  Thanks for this order.   Luanna Salk, [facility] SLP Acute Rehab Services Pager [phone] Office [phone]

## 2018-05-13 NOTE — H&P (Signed)
History and Physical    EBBIE Hays TZG:[id number] DOB: 12-10-1930 DOA: 05/12/2018  [facility]  Patient coming from: Home.  Chief Complaint: Cough.  Fatigue.  HPI: Derek Hays is a 85 y.o. male with history of CVA with left-sided hemiparesis essentially bedbound, hypertension, atrial fibrillation, diastolic CHF, postoperative hypothyroidism was referred to the ER after patient's home health care nurse found that patient has been having productive cough for last 3 days discolored sputum with weakness and also had some episodes of nausea vomiting last 2 days and poor appetite.  Patient is a poor historian to provide much history.  Most of the history was obtained from the ER physician.  ED Course: In the ER chest x-ray shows infiltrates concerning for pneumonia.  Note that patient was admitted last month at [facility] teaching service for pneumonia of the right middle lobe which has been persistent in the chest x-ray.  Labs revealed creatinine 1.4 WBC count of 8.5 lactate of 1.6 BNP of 201 troponin less than 0.03 and EKG showing paced rhythm.  Given the infiltrates with cough concerning for pneumonia patient was started on antibiotics.  Abdomen appears benign.  Alkaline phosphatase was 284.  Patient had a CAT scan done last admission which showed some left-sided kidney fullness with bladder wall thickening with history of previous bladder cancer this was concerning.  Review of Systems: As per HPI, rest all negative.   Past Medical History:  Diagnosis Date  . Atrial fibrillation ([facility])   . Bladder cancer ([place]) 1984  . Hypertension   . Stroke [facility])     Past Surgical History:  Procedure Laterality Date  . IR CT HEAD LTD  05/27/2017  . IR PERCUTANEOUS ART THROMBECTOMY/INFUSION INTRACRANIAL INC DIAG ANGIO  05/27/2017  . PACEMAKER INSERTION    . RADIOLOGY WITH ANESTHESIA N/A 05/27/2017   Procedure: RADIOLOGY WITH ANESTHESIA;  Surgeon: Luanne Bras, MD;  Location:  [place];  Service: Radiology;  Laterality: N/A;     reports that he quit smoking about 35 years ago. His smoking use included cigarettes. He has a 37.50 pack-year smoking history. He has never used smokeless tobacco. He reports previous alcohol use. He reports that he does not use drugs.  No Known Allergies  Family History  Problem Relation Age of Onset  . Emphysema Father     Prior to Admission medications   Medication Sig Start Date End Date Taking? Authorizing Provider  albuterol (PROVENTIL) (2.5 MG/3ML) 0.083% nebulizer solution Take 3 mLs (2.5 mg total) by nebulization every 6 (six) hours. Patient taking differently: Take 2.5 mg by nebulization every 6 (six) hours as needed for wheezing or shortness of breath.  08/13/17  Yes Rory Percy, DO  apixaban (ELIQUIS) 5 MG TABS tablet Take 1 tablet (5 mg total) by mouth 2 (two) times daily. 06/19/17  Yes Angiulli, Lavon Paganini, PA-C  bethanechol (URECHOLINE) 25 MG tablet Take 1 tablet (25 mg total) by mouth 3 (three) times daily. Patient taking differently: Take 25 mg by mouth 3 (three) times daily. 7:30am, 5pm, 9pm 06/19/17  Yes Angiulli, Lavon Paganini, PA-C  Cholecalciferol (VITAMIN D) 50 MCG (2000 UT) tablet Take 2,000 Units by mouth daily.   Yes [provider]  folic acid (FOLVITE) 1 MG tablet Take 1 mg by mouth daily.   Yes [provider]  furosemide (LASIX) 20 MG tablet Take 20 mg by mouth daily as needed for fluid.   Yes [provider]  levothyroxine (SYNTHROID, LEVOTHROID) 137 MCG tablet  Take 137 mcg by mouth daily before breakfast.   Yes [provider]  pregabalin (LYRICA) 200 MG capsule Take 200 mg by mouth 2 (two) times daily.   Yes [provider]  tamsulosin (FLOMAX) 0.4 MG CAPS capsule Take 1 capsule (0.4 mg total) by mouth daily after supper. Patient taking differently: Take 0.4 mg by mouth at bedtime.  06/19/17  Yes Angiulli, Lavon Paganini, PA-C  traZODone (DESYREL) 100 MG tablet Take 200 mg by  mouth at bedtime.   Yes [provider]  amLODipine (NORVASC) 10 MG tablet Take 1 tablet (10 mg total) by mouth daily. Patient not taking: Reported on 04/01/2018 08/14/17   Rory Percy, DO  atorvastatin (LIPITOR) 40 MG tablet Take 1 tablet (40 mg total) by mouth daily at 6 PM. Patient not taking: Reported on 05/12/2018 08/13/17   Rory Percy, DO  carboxymethylcellulose (REFRESH TEARS) 0.5 % SOLN Place 1 drop into both eyes every 6 (six) hours as needed (dry eyes).    [provider]  latanoprost (XALATAN) 0.005 % ophthalmic solution Place 1 drop into both eyes at bedtime.    [provider]    Physical Exam: Vitals:   05/13/18 0100 05/13/18 0130 05/13/18 0200 05/13/18 0230  BP: (!) 146/91 (!) 133/95 (!) 149/99 (!) 158/102  Pulse: 66 60 60 (!) 59  Resp: 17 17 13 17   Temp:      TempSrc:      SpO2: 96% 98% 98% 99%  Weight:      Height:          Constitutional: Moderately built and nourished. Vitals:   05/13/18 0100 05/13/18 0130 05/13/18 0200 05/13/18 0230  BP: (!) 146/91 (!) 133/95 (!) 149/99 (!) 158/102  Pulse: 66 60 60 (!) 59  Resp: 17 17 13 17   Temp:      TempSrc:      SpO2: 96% 98% 98% 99%  Weight:      Height:       Eyes: Anicteric no pallor. ENMT: No discharge from the ears eyes nose or mouth. Neck: No mass felt.  No JVD appreciated. Respiratory: No rhonchi or crepitations. Cardiovascular: S1-S2 heard. Abdomen: Soft nontender bowel sounds present. Musculoskeletal: No edema. Skin: Chronic skin changes. Neurologic: Alert awake oriented to time place and person.  Left-sided hemiplegia. Psychiatric: Appears normal.   Labs on Admission: I have personally reviewed following labs and imaging studies  CBC: Recent Labs  Lab 05/12/18 2015  WBC 8.5  NEUTROABS 6.3  HGB 14.4  HCT 44.7  MCV 91.4  PLT 428   Basic Metabolic Panel: Recent Labs  Lab 05/12/18 2100  NA 140  K 4.5  CL 106  CO2 24  GLUCOSE 98  BUN 29*  CREATININE  1.45*  CALCIUM 9.0   GFR: Estimated Creatinine Clearance: 40.9 mL/min (A) (by C-G formula based on SCr of 1.45 mg/dL (H)). Liver Function Tests: Recent Labs  Lab 05/12/18 2100  AST 35  ALT 19  ALKPHOS 284*  BILITOT 1.0  PROT 6.7  ALBUMIN 3.2*   No results for input(s): LIPASE, AMYLASE in the last 168 hours. No results for input(s): AMMONIA in the last 168 hours. Coagulation Profile: No results for input(s): INR, PROTIME in the last 168 hours. Cardiac Enzymes: Recent Labs  Lab 05/12/18 2100  TROPONINI <0.03   BNP (last 3 results) No results for input(s): PROBNP in the last 8760 hours. HbA1C: No results for input(s): HGBA1C in the last 72 hours. CBG: Recent Labs  Lab  05/12/18 2043  GLUCAP 77   Lipid Profile: No results for input(s): CHOL, HDL, LDLCALC, TRIG, CHOLHDL, LDLDIRECT in the last 72 hours. Thyroid Function Tests: No results for input(s): TSH, T4TOTAL, FREET4, T3FREE, THYROIDAB in the last 72 hours. Anemia Panel: No results for input(s): VITAMINB12, FOLATE, FERRITIN, TIBC, IRON, RETICCTPCT in the last 72 hours. Urine analysis:    Component Value Date/Time   COLORURINE YELLOW 05/12/2018 2128   APPEARANCEUR HAZY (A) 05/12/2018 2128   LABSPEC 1.013 05/12/2018 2128   PHURINE 5.0 05/12/2018 2128   GLUCOSEU NEGATIVE 05/12/2018 2128   HGBUR NEGATIVE 05/12/2018 2128   BILIRUBINUR NEGATIVE 05/12/2018 2128   KETONESUR NEGATIVE 05/12/2018 2128   PROTEINUR NEGATIVE 05/12/2018 2128   NITRITE NEGATIVE 05/12/2018 2128   LEUKOCYTESUR TRACE (A) 05/12/2018 2128   Sepsis Labs: @LABRCNTIP (procalcitonin:4,lacticidven:4) )No results found for this or any previous visit (from the past 240 hour(s)).   Radiological Exams on Admission: Dg Chest 2 View  Result Date: 05/12/2018 CLINICAL DATA:  Productive cough. EXAM: CHEST - 2 VIEW COMPARISON:  Chest x-ray dated March 31, 2018. FINDINGS: Unchanged left chest wall pacemaker. Stable cardiomediastinal silhouette. Normal  pulmonary vascularity. Patchy opacity in the right middle lobe. Unchanged small right pleural effusion. The left lung is clear. No pneumothorax. No acute osseous abnormality. IMPRESSION: 1. Right middle lobe pneumonia. 2. Unchanged small right pleural effusion. Electronically Signed   By: Titus Dubin M.D.   On: 05/12/2018 20:45    EKG: Independently reviewed.  Paced rhythm.  Assessment/Plan Principal Problem:   Pneumonia Active Problems:   Stage 3 chronic kidney disease (HCC)   PAF (paroxysmal atrial fibrillation) (HCC)   Benign essential HTN    1. Pneumonia -appears to be recurrent at this time.  For now patient is placed on vancomycin and Zosyn.  Check MRSA PCR screen.  Follow cultures.  Suspect may be aspiration from recent vomiting.  However since patient symptoms and the radiology findings are persistent we will check a CT scan of the chest.  Need to get swallow evaluation. 2. Nausea vomiting -abdomen appears benign.  Given the left kidney fullness recently in the CAT scan last month with some bladder wall thickening will check UA and also CT renal study. 3. Paroxysmal atrial fibrillation on apixaban.  Patient has a pacemaker. 4. Diastolic CHF takes as needed Lasix.  Last 2D echo done in February 2019 was showing EF of 60 to 65% with grade 2 diastolic dysfunction.  Appears compensated. 5. Chronic kidney disease stage III closely monitor metabolic panel. 6. History of CVA with left-sided hemiparesis on apixaban and statins.   DVT prophylaxis: Apixaban. Code Status: Full code. Family Communication: No family at the bedside. Disposition Plan: Home when stable. Consults called: Physical therapy. Admission status: Observation.   Rise Patience MD [facility] Pager [phone].  If 7PM-7AM, please contact night-coverage [email or web address] Password [facility]  05/13/2018, 2:37 AM

## 2018-05-13 NOTE — ED Notes (Signed)
Pt able to be awakened with jostling and shouting his name.  Pt immediately falls back asleep. This RN feels it is unsafe to administer PO meds d/t pt risk of aspiration d/t being drowsy.  Admitting provider made aware.

## 2018-05-13 NOTE — ED Notes (Signed)
Family at bedside. 

## 2018-05-13 NOTE — Progress Notes (Signed)
  Same day note  Patient seen and examined at bedside.  Patient was admitted to the hospital for fatigue, pneumonia, nausea vomiting..  At the time of my evaluation, patient is somnolent unable to arouse.  Nursing staff reported that patient received a high dose of Lyrica this morning 200 mg x 1.  His vitals were stable he did not have any distress.  He was able to briefly awake with motor stimulation.  Physical examination; patient is deeply somnolent, briefly arousable after motor stimulus.  Laboratory data and imaging was reviewed  Assessment and Plan. Continue vancomycin and Zosyn for pneumonia.  Follow blood cultures.  Continue antiemetics for nausea and vomiting.  Patient will be continued on apixaban for paroxysmal atrial fibrillation.  Last echocardiogram from February 2019 showed ejection fraction of 60 to 65%.  Currently, patient is compensated.  Will closely monitor BMP with his prior history of CKD.  Patient does have history of CVA with left-sided hemiparesis.  Will get physical therapy evaluation.  Currently patient is a deeply somnolent and will hold off with sedative medications.  Spoke with the nursing staff at bedside.  Would recommend hold off with with Lyrica and trazodone (home medication) till mentation clears, cardiac lower dose as clinically needed.  No Charge  Signed,  Delila Pereyra, MD [facility]

## 2018-05-14 ENCOUNTER — Inpatient Hospital Stay (HOSPITAL_COMMUNITY): Payer: Medicare Other

## 2018-05-14 DIAGNOSIS — G8194 Hemiplegia, unspecified affecting left nondominant side: Secondary | ICD-10-CM

## 2018-05-14 DIAGNOSIS — R509 Fever, unspecified: Secondary | ICD-10-CM

## 2018-05-14 DIAGNOSIS — R05 Cough: Secondary | ICD-10-CM

## 2018-05-14 LAB — BLOOD CULTURE ID PANEL (REFLEXED)
Acinetobacter baumannii: NOT DETECTED
CANDIDA ALBICANS: NOT DETECTED
Candida glabrata: NOT DETECTED
Candida krusei: NOT DETECTED
Candida parapsilosis: NOT DETECTED
Candida tropicalis: NOT DETECTED
ENTEROBACTERIACEAE SPECIES: NOT DETECTED
Enterobacter cloacae complex: NOT DETECTED
Enterococcus species: NOT DETECTED
Escherichia coli: NOT DETECTED
Haemophilus influenzae: NOT DETECTED
KLEBSIELLA PNEUMONIAE: NOT DETECTED
Klebsiella oxytoca: NOT DETECTED
Listeria monocytogenes: NOT DETECTED
Methicillin resistance: DETECTED — AB
Neisseria meningitidis: NOT DETECTED
Proteus species: NOT DETECTED
Pseudomonas aeruginosa: NOT DETECTED
Serratia marcescens: NOT DETECTED
Staphylococcus aureus (BCID): NOT DETECTED
Staphylococcus species: DETECTED — AB
Streptococcus agalactiae: NOT DETECTED
Streptococcus pneumoniae: NOT DETECTED
Streptococcus pyogenes: NOT DETECTED
Streptococcus species: NOT DETECTED

## 2018-05-14 LAB — URINE CULTURE

## 2018-05-14 MED ORDER — PREGABALIN 75 MG PO CAPS
150.0000 mg | ORAL_CAPSULE | Freq: Two times a day (BID) | ORAL | Status: DC
  Administered 2018-05-14 – 2018-05-24 (×20): 150 mg via ORAL
  Filled 2018-05-14 (×21): qty 2

## 2018-05-14 MED ORDER — POLYMYXIN B-TRIMETHOPRIM 10000-0.1 UNIT/ML-% OP SOLN
2.0000 [drp] | Freq: Four times a day (QID) | OPHTHALMIC | Status: DC
  Filled 2018-05-14: qty 10

## 2018-05-14 MED ORDER — SULFACETAMIDE SODIUM 10 % OP SOLN: 2.0000 [drp] | OPHTHALMIC | Status: DC

## 2018-05-14 MED ORDER — MUPIROCIN 2 % EX OINT
1.0000 "application " | TOPICAL_OINTMENT | Freq: Two times a day (BID) | CUTANEOUS | Status: AC
  Administered 2018-05-14 – 2018-05-19 (×10): 1 via NASAL
  Filled 2018-05-14: qty 22

## 2018-05-14 MED ORDER — CHLORHEXIDINE GLUCONATE CLOTH 2 % EX PADS
6.0000 | MEDICATED_PAD | Freq: Every day | CUTANEOUS | Status: AC
  Administered 2018-05-14 – 2018-05-18 (×4): 6 via TOPICAL

## 2018-05-14 MED ORDER — POLYMYXIN B-TRIMETHOPRIM 10000-0.1 UNIT/ML-% OP SOLN
1.0000 [drp] | OPHTHALMIC | Status: DC
  Administered 2018-05-14 – 2018-05-24 (×55): 1 [drp] via OPHTHALMIC
  Filled 2018-05-14: qty 10

## 2018-05-14 MED ORDER — PREGABALIN 75 MG PO CAPS: 200.0000 mg | ORAL_CAPSULE | Freq: Two times a day (BID) | ORAL | Status: DC

## 2018-05-14 MED ORDER — SODIUM CHLORIDE 0.9 % IV SOLN
1.0000 g | Freq: Two times a day (BID) | INTRAVENOUS | Status: DC
  Administered 2018-05-14 – 2018-05-20 (×12): 1 g via INTRAVENOUS
  Filled 2018-05-14 (×13): qty 1

## 2018-05-14 NOTE — Progress Notes (Signed)
Pharmacy Antibiotic Note  Derek Hays is a 85 y.o. male admitted on 05/12/2018 with pneumonia.  Pharmacy has been consulted for vancomycin and cefepime dosing.  Patient was started on piperacillin/tazobactam on admission which is being changed to cefepime this evening for HCAP.   Assessment:  WBC WNL  Afebrile  Day #2 of IV antibiotics  SCr 1.35, CrCl ~ 44 mL/min  Plan:   Continue vancomycin 1250 mg IV q24h. Goal AUC 400-500.  Cefepime 1 g IV q12h  Continue to follow renal function and adjust dose as needed  Height: 6' (182.9 cm) Weight: 192 lb (87.1 kg) IBW/kg (Calculated) : 77.6  Temp (24hrs), Avg:97.7 F (36.5 C), Min:97.4 F (36.3 C), Max:97.9 F (36.6 C)  Recent Labs  Lab 05/12/18 2015 05/12/18 2100 05/13/18 0359  WBC 8.5  --  8.4  CREATININE  --  1.45* 1.35*  LATICACIDVEN 1.6  --   --     Estimated Creatinine Clearance: 43.9 mL/min (A) (by C-G formula based on SCr of 1.35 mg/dL (H)).    No Known Allergies  Antimicrobials this admission: Vancomycin 05/12/2018 >> Cefepime 05/12/2018 x1 then started 1/29 Zosyn 05/13/2018 >> 1/29  Dose adjustments this admission:  Microbiology results: 1/27 Blood: CoNS 1/28 MRSA PCR: Positive  Thank you for allowing pharmacy to be a part of this patient's care.  Lenis Noon, PharmD 05/14/2018 6:06 PM

## 2018-05-14 NOTE — Progress Notes (Addendum)
[facility] Progress Note  Subjective: L eye is red, doing much better per RN, eating and interacting more  Vitals:   05/13/18 1400 05/13/18 1518 05/13/18 2027 05/14/18 0501  BP: (!) 151/86 (!) 151/85 127/81 131/82  Pulse: (!) 59 62 60 60  Resp: 17 17 18 17   Temp:  97.8 F (36.6 C) 97.9 F (36.6 C) (!) 97.4 F (36.3 C)  TempSrc:  Oral Oral Oral  SpO2: 100% 100% 97% 96%  Weight:      Height:        Inpatient medications: . apixaban  5 mg Oral BID  . bethanechol  25 mg Oral 3 times per day  . Chlorhexidine Gluconate Cloth  6 each Topical Q0600  . cholecalciferol  2,000 Units Oral Daily  . folic acid  1 mg Oral Daily  . latanoprost  1 drop Both Eyes QHS  . levothyroxine  137 mcg Oral Q0600  . mupirocin ointment  1 application Nasal BID  . pregabalin  150 mg Oral BID  . tamsulosin  0.4 mg Oral QHS   . piperacillin-tazobactam (ZOSYN)  IV 3.375 g (05/14/18 1349)  . vancomycin 1,250 mg (05/14/18 1210)   acetaminophen **OR** acetaminophen, albuterol, ondansetron **OR** ondansetron (ZOFRAN) IV, polyvinyl alcohol  Exam: No distress, elderly WM , weak on L side Eyes: Anicteric no pallor. ENMT: No discharge from the ears eyes nose or mouth. L eye red and draining  Neck: No mass felt.  No JVD appreciated. Respiratory: No rhonchi or crepitations. Cardiovascular: S1-S2 heard. Abdomen: Soft nontender bowel sounds present. Musculoskeletal: No edema. Skin: Chronic skin changes. Neurologic: Alert awake oriented to time place and person.  Left-sided hemiplegia. Psychiatric: Appears normal.    Presentation Summary: Derek Hays is a 85 y.o. male with history of CVA with left-sided hemiparesis essentially bedbound, hypertension, atrial fibrillation, diastolic CHF, postoperative hypothyroidism was referred to the ER after patient's home health care nurse found that patient has been having productive cough for last 3 days discolored sputum with weakness and also had some  episodes of nausea vomiting last 2 days and poor appetite.  Patient is a poor historian to provide much history.  Most of the history was obtained from the ER physician. ED Course: In the ER chest x-ray shows infiltrates concerning for pneumonia.  Note that patient was admitted last month at [facility] teaching service for pneumonia of the right middle lobe which has been persistent in the chest x-ray.  Labs revealed creatinine 1.4 WBC count of 8.5 lactate of 1.6 BNP of 201 troponin less than 0.03 and EKG showing paced rhythm.  Given the infiltrates with cough concerning for pneumonia patient was started on antibiotics.  Abdomen appears benign.  Alkaline phosphatase was 284.  Patient had a CAT scan done last admission which showed some left-sided kidney fullness with bladder wall thickening with history of previous bladder cancer this was concerning.       Hospital Problems: Principal Problem:   Pneumonia Active Problems:   Stage 3 chronic kidney disease (HCC)   PAF (paroxysmal atrial fibrillation) (HCC)   Benign essential HTN   Hospital course: 1. Cough/ fevers : questionable PNA.  CT negative for consolidation. will cont IV abx for now but consider de-escalating soon. Per pharm request will change zosyn to cefepime for CKD and risk of aki w/ vanc/ zosyn. Check MRSA PCR screen 2. Dysphagia - seen by Speech [facility], poor swallowing, hx CVA and recurrent aspiration events.  They advanced diet to full liquids  and strongly recommend pall care consult. Will consult pall care. 3. Nausea / vomiting - UA negative. Liver mass infiltrating by CT. Abdomen benign.  Better.  4. Paroxysmal atrial fibrillation on apixaban.  Patient has a pacemaker. 5. Diastolic CHF takes as needed Lasix.  Last 2D echo done in February 2019 was showing EF of 60 to 65% with grade 2 diastolic dysfunction.  Appears compensated. No vol excess.  6. CKD III - creat 1.3- 1.6 here. 7. History of CVA with left-sided hemiparesis on apixaban  and statins. 8. Conjunctivitis - abx in L eye 9. Neuropathy - Lyrica at renal dosing 10. Altered mental status - feeling/ looks better today 11. Left uretero-hydronephrosis/ hx remote bladder Ca - worsening. Has infiltrating liver mass . Urol in dec 2019 suspected recurrent bladder Ca (bw thickening), however pt at this time is not good candidate for aggressive intervention. Will d/w family and I am consulting pall care.    DVT prophylaxis: Apixaban. Code Status: Full code. Family Communication: No family at the bedside. Disposition Plan: Home when stable. Consults called: Physical therapy. Admission status: Observation.  Kelly Splinter MD [facility] pgr [phone] 05/14/2018, 5:21 PM   Imaging:  CT scan abd/ chest/ pelvis 05/13/18 >>    - infiltrating liver mass, worrisome for malignancy   - mod large R effusion, atx, no PNA   - atrophic R kidney, progressive mod severe ureterohydro of L kidney   - bladder wall thickening  Swallow eval 1/29 Clinical Impression             Patient presents with moderate oropharyngeal dysphagia - oral deficits c/b decreased lingual control, lingual pumping, premature spillage of boluses and residuals that pt senses only if not in left buccal region.  SILENT spiration x2 occured during testing - once with nectar as small portion of bolus spilled into open airway before swallow triggered.  Aspiration of suspected TRACE puree pharyngeal residuals (mixed with secretions) also noted just below vocal folds.  Of note, pt did cough once reflexively during MBS, likely due to aspirates reaching carina.  Pt's head in natural chin tuck position which increased oral deficits due to gravity.  Cued dry swallows helpful to decrease oropharyngeal residuals.  Expectoration of pudding residuals from left lateral sulci achieved after completion of study.  As pt reports more problems coughing with solids than liquids and oral deficits significant, at this time  recommend consider full liquid for now and advancing as medically improved.  Pt educated to findings/recommendations and SLP will follow.  In addition, esophageal clearance appeared impaired with suspected tertiary contractions and lack of pt sensation.  Given pt advanced age, stroke related chronic dysphagia/aspiration and recurrent admits with imaging showing concerns for pna - highly recommend palliative consult for Mr Kilgore.    Swallow Evaluation Recommendations  SLP Diet Recommendations: Thin liquid;Nectar thick liquid(full liquids)  Liquid Administration via: Straw  Medication Administration: Crushed with puree  Supervision: Staff to assist with self feeding;Full supervision/cueing for compensatory strategies  Compensations: Slow rate;Small sips/bites;Multiple dry swallows after each bite/sip;Follow solids with liquid;Other (Comment);Lingual sweep for clearance of pocketing;Minimize environmental distractions(intermittently clear throat/cough)  Postural Changes: Remain semi-upright after after feeds/meals (Comment);Seated upright at 90 degrees  Oral Care Recommendations: Oral care BID  Other Recommendations: Have oral suction available  Recent Labs  Lab 05/12/18 2100 05/13/18 0359  NA 140 140  K 4.5 4.0  CL 106 106  CO2 24 24  GLUCOSE 98 100*  BUN 29* 29*  CREATININE 1.45*  1.35*  CALCIUM 9.0 9.1   Recent Labs  Lab 05/12/18 2100  AST 35  ALT 19  ALKPHOS 284*  BILITOT 1.0  PROT 6.7  ALBUMIN 3.2*   Recent Labs  Lab 05/12/18 2015 05/13/18 0359  WBC 8.5 8.4  NEUTROABS 6.3  --   HGB 14.4 13.7  HCT 44.7 42.5  MCV 91.4 90.4  PLT 179 173   Iron/TIBC/Ferritin/ %Sat No results found for: IRON, TIBC, FERRITIN, IRONPCTSAT

## 2018-05-14 NOTE — Progress Notes (Signed)
PHARMACY - PHYSICIAN COMMUNICATION CRITICAL VALUE ALERT - BLOOD CULTURE IDENTIFICATION (BCID)  Derek Hays is an 85 y.o. male who presented to [facility] on 05/12/2018 with a chief complaint of fatigue  Assessment:  Patient with BCID only showing Staphlococcus species (include suspected source if known)  Name of physician (or Provider) Contacted: none  Current antibiotics: Vancomycin and Zosyn   Changes to prescribed antibiotics recommended:  Patient is on recommended antibiotics - No changes needed--await final results until can either narrow or change antibiotics.  Results for orders placed or performed during the hospital encounter of 05/12/18  Blood Culture ID Panel (Reflexed) (Collected: 05/12/2018  9:28 PM)  Result Value Ref Range   Enterococcus species NOT DETECTED NOT DETECTED   Listeria monocytogenes NOT DETECTED NOT DETECTED   Staphylococcus species DETECTED (A) NOT DETECTED   Staphylococcus aureus (BCID) NOT DETECTED NOT DETECTED   Methicillin resistance DETECTED (A) NOT DETECTED   Streptococcus species NOT DETECTED NOT DETECTED   Streptococcus agalactiae NOT DETECTED NOT DETECTED   Streptococcus pneumoniae NOT DETECTED NOT DETECTED   Streptococcus pyogenes NOT DETECTED NOT DETECTED   Acinetobacter baumannii NOT DETECTED NOT DETECTED   Enterobacteriaceae species NOT DETECTED NOT DETECTED   Enterobacter cloacae complex NOT DETECTED NOT DETECTED   Escherichia coli NOT DETECTED NOT DETECTED   Klebsiella oxytoca NOT DETECTED NOT DETECTED   Klebsiella pneumoniae NOT DETECTED NOT DETECTED   Proteus species NOT DETECTED NOT DETECTED   Serratia marcescens NOT DETECTED NOT DETECTED   Haemophilus influenzae NOT DETECTED NOT DETECTED   Neisseria meningitidis NOT DETECTED NOT DETECTED   Pseudomonas aeruginosa NOT DETECTED NOT DETECTED   Candida albicans NOT DETECTED NOT DETECTED   Candida glabrata NOT DETECTED NOT DETECTED   Candida krusei NOT DETECTED NOT DETECTED   Candida  parapsilosis NOT DETECTED NOT DETECTED   Candida tropicalis NOT DETECTED NOT DETECTED    [name] 05/14/2018  5:55 AM

## 2018-05-14 NOTE — Progress Notes (Addendum)
Modified Barium Swallow Progress Note  Patient Details  Name: Derek Hays MRN: [id number] Date of Birth: Oct 05, 1930  Today's Date: 05/14/2018  Modified Barium Swallow completed.  Full report located under Chart Review in the Imaging Section.  Brief recommendations include the following:  Clinical Impression  Patient presents with moderate oropharyngeal dysphagia - oral deficits c/b decreased lingual control, lingual pumping, premature spillage of boluses and residuals that pt senses only if not in left buccal region.  SILENT spiration x2 occured during testing - once with nectar as small portion of bolus spilled into open airway before swallow triggered.  Aspiration of suspected TRACE puree pharyngeal residuals (mixed with secretions) also noted just below vocal folds.  Of note, pt did cough once reflexively during MBS, likely due to aspirates reaching carina.  Pt's head in natural chin tuck position which increased oral deficits due to gravity.  Cued dry swallows helpful to decrease oropharyngeal residuals.  Expectoration of pudding residuals from left lateral sulci achieved after completion of study.  As pt reports more problems coughing with solids than liquids and oral deficits significant, at this time recommend consider full liquid for now and advancing as medically improved.  Pt educated to findings/recommendations and SLP will follow.  In addition, esophageal clearance appeared impaired with suspected tertiary contractions and lack of pt sensation.  Given pt advanced age, stroke related chronic dysphagia/aspiration and recurrent admits with imaging showing concerns for pna - highly recommend palliative consult for Derek Hays.     Swallow Evaluation Recommendations       SLP Diet Recommendations: Thin liquid;Nectar thick liquid(full liquids)   Liquid Administration via: Straw   Medication Administration: Crushed with puree   Supervision: Staff to assist with self feeding;Full  supervision/cueing for compensatory strategies   Compensations: Slow rate;Small sips/bites;Multiple dry swallows after each bite/sip;Follow solids with liquid;Other (Comment);Lingual sweep for clearance of pocketing;Minimize environmental distractions(intermittently clear throat/cough)   Postural Changes: Remain semi-upright after after feeds/meals (Comment);Seated upright at 90 degrees   Oral Care Recommendations: Oral care BID   Other Recommendations: Have oral suction available   Luanna Salk, MS [facility] SLP Acute Rehab Services Pager [phone] Office [phone]  Derek Hays 05/14/2018,9:24 AM

## 2018-05-15 DIAGNOSIS — Z7189 Other specified counseling: Secondary | ICD-10-CM

## 2018-05-15 DIAGNOSIS — Z515 Encounter for palliative care: Secondary | ICD-10-CM

## 2018-05-15 LAB — BASIC METABOLIC PANEL
Anion gap: 7 (ref 5–15)
BUN: 28 mg/dL — ABNORMAL HIGH (ref 8–23)
CO2: 26 mmol/L (ref 22–32)
Calcium: 9 mg/dL (ref 8.9–10.3)
Chloride: 110 mmol/L (ref 98–111)
Creatinine, Ser: 1.37 mg/dL — ABNORMAL HIGH (ref 0.61–1.24)
GFR calc Af Amer: 54 mL/min — ABNORMAL LOW (ref >60)
GFR calc non Af Amer: 47 mL/min — ABNORMAL LOW (ref >60)
Glucose, Bld: 99 mg/dL (ref 70–99)
Potassium: 3.6 mmol/L (ref 3.5–5.1)
Sodium: 143 mmol/L (ref 135–145)

## 2018-05-15 LAB — CBC
HCT: 39.5 % (ref 39.0–52.0)
Hemoglobin: 12.6 g/dL — ABNORMAL LOW (ref 13.0–17.0)
MCH: 29.2 pg (ref 26.0–34.0)
MCHC: 31.9 g/dL (ref 30.0–36.0)
MCV: 91.6 fL (ref 80.0–100.0)
NRBC: 0 % (ref 0.0–0.2)
Platelets: 145 10*3/uL — ABNORMAL LOW (ref 150–400)
RBC: 4.31 MIL/uL (ref 4.22–5.81)
RDW: 15.8 % — ABNORMAL HIGH (ref 11.5–15.5)
WBC: 7 10*3/uL (ref 4.0–10.5)

## 2018-05-15 LAB — CULTURE, BLOOD (ROUTINE X 2): Special Requests: ADEQUATE

## 2018-05-15 MED ORDER — POLYETHYLENE GLYCOL 3350 17 G PO PACK
17.0000 g | PACK | Freq: Every day | ORAL | Status: DC | PRN
  Administered 2018-05-20: 17 g via ORAL
  Filled 2018-05-15: qty 1

## 2018-05-15 NOTE — Progress Notes (Signed)
  Speech Language Pathology Treatment: Dysphagia  Patient Details Name: Derek Hays MRN: [id number] DOB: 19-Aug-1930 Today's Date: 05/15/2018 Time: 1020-1040 SLP Time Calculation (min) (ACUTE ONLY): 20 min  Assessment / Plan / Recommendation Clinical Impression  Pt was seen at bedside for follow up after MBS completed Wednesday 05/14/2018. No family was present today. Pt was given trials of puree and thin liquid via straw. Oral residue noted following small bolus of puree (applesauce), with cues required to dry swallow and clear oral cavity. Orange juice via straw was tolerated without oral residue. No overt s/s aspiration observed following either consistency. Safe swallow precautions at [facility] were reviewed with pt, and updated to encourage straw use with liquids, and maintain upright position during and for 30 minutes after meals. Pt required cues to limit bolus size and rate, and clear throat occasionally following po trials today. RN reports pt appears to be tolerating current diet. Will continue full liquid diet with thin liquids via straw, crushed meds, as pt does not appear safe to advance to puree at this time.   Pt reports feeling symptoms of UTI and constipation. RN informed.  HPI        SLP Plan  Continue with current plan of care       Recommendations  Diet recommendations: Thin liquid(full liquid) Liquids provided via: Straw Medication Administration: Crushed with puree Supervision: Patient able to self feed;Full supervision/cueing for compensatory strategies;Staff to assist with self feeding Compensations: Slow rate;Small sips/bites;Multiple dry swallows after each bite/sip;Follow solids with liquid;Other (Comment);Lingual sweep for clearance of pocketing;Minimize environmental distractions Postural Changes and/or Swallow Maneuvers: Seated upright 90 degrees;Upright 30-60 min after meal                Oral Care Recommendations: Oral care QID Follow up Recommendations:  Skilled Nursing facility SLP Visit Diagnosis: Dysphagia, oropharyngeal phase (R13.12);Dysphagia, pharyngoesophageal phase (R13.14) Plan: Continue with current plan of care       [place]ount[place]. Quentin Ore [facility], CCC-SLP Speech Language Pathologist [phone]  Shonna Chock 05/15/2018, 10:42 AM

## 2018-05-15 NOTE — Consult Note (Signed)
Consultation Note Date: 05/15/2018   Patient Name: Derek Hays  DOB: September 17, 1930  MRN: [id number]  Age / Sex: 85 y.o., male  PCP: [facility] Referring Physician: Roney Jaffe, MD  Reason for Consultation: Establishing goals of care  HPI/Patient Profile: 85 y.o. male  with past medical history of CVA with left hemiparesis, hypertension, diastolic heart failure, atrial fibrillation on Eliquis, and bladder cancer who was admitted on 05/12/2018 with pneumonia. Patient presented to ED for worsening productive cough with associated nausea, vomiting, and poor appetite.   Clinical Assessment and Goals of Care:  I have reviewed medical records including EPIC notes, labs and imaging, received report from Derek Hays, Therapist, sports, assessed the patient and then met at the bedside  to discuss diagnosis prognosis, Derek Hays, EOL wishes, disposition and options.  I introduced Palliative Medicine as specialized medical care for people living with serious illness. It focuses on providing relief from the symptoms and stress of a serious illness. The goal is to improve quality of life for both the patient and the family.  Upon arrival to the room, Derek Hays was resting comfortably in bed. There was no family at bedside. Derek Hays stated he was cold, but that he was otherwise comfortable. He says he is feeling a little better.  As far as functional and nutritional status Derek Hays lives at home with his wife, Basilia Jumbo. He has a caregiver who helps him with his care.  He tells [place] about his past history of a stroke and the deficits that it has left him with. He endorses that the stroke has affected most of the left side of his body including his jaw and ability to swallow.   We discussed his current illness and what it means in the larger context of his on-going co-morbidities. We discussed how aspiration pneumonia is a recurrent,  progressive disease and how patients require recurrent hospitalization for pneumonia. Derek Hays states that he has had pneumonia 3-4 times and understands it is due to his swallowing difficulties. He is hopeful that he continues to feel better.   We will continue discussing advanced directives and goals of care with Derek Hays and his wife tomorrow around noon. Derek Hays states that his wife has a pelvic fracture and has some difficulties ambulating long distances.   Questions and concerns were addressed.  The Patient was encouraged to call with questions or concerns.     Primary Decision Maker:  PATIENT     SUMMARY OF RECOMMENDATIONS    Meet with patient and wife tomorrow  Further recommendations to follow    Code Status/Advance Care Planning:  Full Code   Symptom Management:   Per Primary Team  Additional Recommendations (Limitations, Scope, Preferences):  Full Scope Treatment  Palliative Prophylaxis:   Aspiration  Psycho-social/Spiritual:   Desire for further Chaplaincy support: not discussed at this visit  Prognosis:   Unable to determine at this time; dependent on clinical course in presence of ?recurrent bladder cancer with recurrent aspiration pneumonia secondary to left hemiparesis/CVA.   Discharge  Planning: To Be Determined      Primary Diagnoses: Present on Admission: . Pneumonia . Stage 3 chronic kidney disease ([place]) . PAF (paroxysmal atrial fibrillation) ([place]) . Benign essential HTN   I have reviewed the medical record, interviewed the patient and family, and examined the patient. The following aspects are pertinent.  Past Medical History:  Diagnosis Date  . Atrial fibrillation ([place])   . Bladder cancer ([place]) 1984  . Hypertension   . Stroke [facility])    Social History   Socioeconomic History  . Marital status: Married    Spouse name: Not on file  . Number of children: Not on file  . Years of education: Not on file  . Highest education  level: Not on file  Occupational History    Employer: [employer]  Social Needs  . Financial resource strain: Not on file  . Food insecurity:    Worry: Not on file    Inability: Not on file  . Transportation needs:    Medical: Not on file    Non-medical: Not on file  Tobacco Use  . Smoking status: Former Smoker    Packs/day: 1.50    Years: 25.00    Pack years: 37.50    Types: Cigarettes    Last attempt to quit: 06/12/1982    Years since quitting: 35.9  . Smokeless tobacco: Never Used  . Tobacco comment: pt states he quit when dx with bladder ca  Substance and Sexual Activity  . Alcohol use: Not Currently  . Drug use: No  . Sexual activity: Not on file  Lifestyle  . Physical activity:    Days per week: Not on file    Minutes per session: Not on file  . Stress: To some extent  Relationships  . Social connections:    Talks on phone: More than three times a week    Gets together: Twice a week    Attends religious service: Not on file    Active member of club or organization: Not on file    Attends meetings of clubs or organizations: Never    Relationship status: Not on file  Other Topics Concern  . Not on file  Social History Narrative  . Not on file   Family History  Problem Relation Age of Onset  . Emphysema Father    Scheduled Meds: . apixaban  5 mg Oral BID  . bethanechol  25 mg Oral 3 times per day  . Chlorhexidine Gluconate Cloth  6 each Topical Q0600  . cholecalciferol  2,000 Units Oral Daily  . folic acid  1 mg Oral Daily  . latanoprost  1 drop Both Eyes QHS  . levothyroxine  137 mcg Oral Q0600  . mupirocin ointment  1 application Nasal BID  . pregabalin  150 mg Oral BID  . tamsulosin  0.4 mg Oral QHS  . trimethoprim-polymyxin b  1 drop Left Eye Q4H   Continuous Infusions: . ceFEPime (MAXIPIME) IV 1 g (05/15/18 0904)  . vancomycin 1,250 mg (05/15/18 1235)   PRN Meds:.acetaminophen **OR** acetaminophen, albuterol, ondansetron **OR** ondansetron (ZOFRAN)  IV, polyethylene glycol, polyvinyl alcohol No Known Allergies Review of Systems Negative  Physical Exam  General: elderly male resting in bed Lungs: CTAB Cardiac: regular rate and rhythm Extremities: mild edema in lower extremities, L>R  Vital Signs: BP 122/78 (BP Location: Left Arm)   Pulse 62   Temp (!) 97.4 F (36.3 C)   Resp 17   Ht 6' (1.829 m)  Wt 94.1 kg   SpO2 98%   BMI 28.14 kg/m  Pain Scale: PAINAD   Pain Score: Asleep   SpO2: SpO2: 98 % O2 Device:SpO2: 98 % O2 Flow Rate: .   IO: Intake/output summary:   Intake/Output Summary (Last 24 hours) at 05/15/2018 1426 Last data filed at 05/15/2018 1225 Gross per 24 hour  Intake 719.8 ml  Output 850 ml  Net -130.2 ml    LBM: Last BM Date: 05/13/18 Baseline Weight: Weight: 87.1 kg Most recent weight: Weight: 94.1 kg     Palliative Assessment/Data: 30%     Time In: 1:30pm Time Out: 2:20pm Time Total: 50 minutes   Greater than 50%  of this time was spent counseling and coordinating care related to the above assessment and plan.  Signed by: Ferrel Logan, PA-S  Please contact Palliative Medicine Team phone at [phone] for questions and concerns.  For individual provider: See Shea Evans

## 2018-05-15 NOTE — Progress Notes (Signed)
[facility] Progress Note  Subjective: no new c/o, seen by speech, doing OK w swallowing, no worse  Vitals:   05/14/18 1948 05/15/18 0514 05/15/18 0600 05/15/18 1429  BP: 118/62 122/78  (!) 160/88  Pulse: 60 62  60  Resp: 15 17  18   Temp: (!) 97.4 F (36.3 C) (!) 97.4 F (36.3 C)    TempSrc: Oral     SpO2: 99% 98%  100%  Weight:   94.1 kg   Height:        Inpatient medications: . apixaban  5 mg Oral BID  . bethanechol  25 mg Oral 3 times per day  . Chlorhexidine Gluconate Cloth  6 each Topical Q0600  . cholecalciferol  2,000 Units Oral Daily  . folic acid  1 mg Oral Daily  . latanoprost  1 drop Both Eyes QHS  . levothyroxine  137 mcg Oral Q0600  . mupirocin ointment  1 application Nasal BID  . pregabalin  150 mg Oral BID  . tamsulosin  0.4 mg Oral QHS  . trimethoprim-polymyxin b  1 drop Left Eye Q4H   . ceFEPime (MAXIPIME) IV 1 g (05/15/18 0904)  . vancomycin 1,250 mg (05/15/18 1235)   acetaminophen **OR** acetaminophen, albuterol, ondansetron **OR** ondansetron (ZOFRAN) IV, polyethylene glycol, polyvinyl alcohol  Exam: No distress, elderly WM , weak on L side Eyes: Anicteric no pallor. ENMT: No discharge from the ears eyes nose or mouth. L eye red and draining  Neck: No mass felt.  No JVD appreciated. Respiratory: No rhonchi or crepitations. Cardiovascular: S1-S2 heard. Abdomen: Soft nontender bowel sounds present. Musculoskeletal: No edema. Skin: Chronic skin changes. Neurologic: Alert awake oriented to time place and person.  Left-sided hemiplegia. Psychiatric: Appears normal.    Presentation Summary: Derek Hays is a 85 y.o. male with history of CVA with left-sided hemiparesis essentially bedbound, hypertension, atrial fibrillation, diastolic CHF, postoperative hypothyroidism was referred to the ER after patient's home health care nurse found that patient has been having productive cough for last 3 days discolored sputum with weakness and also had  some episodes of nausea vomiting last 2 days and poor appetite.  Patient is a poor historian to provide much history.  Most of the history was obtained from the ER physician. ED Course: In the ER chest x-ray shows infiltrates concerning for pneumonia.  Note that patient was admitted last month at [facility] teaching service for pneumonia of the right middle lobe which has been persistent in the chest x-ray.  Labs revealed creatinine 1.4 WBC count of 8.5 lactate of 1.6 BNP of 201 troponin less than 0.03 and EKG showing paced rhythm.  Given the infiltrates with cough concerning for pneumonia patient was started on antibiotics.  Abdomen appears benign.  Alkaline phosphatase was 284.  Patient had a CAT scan done last admission which showed some left-sided kidney fullness with bladder wall thickening with history of previous bladder cancer this was concerning.       Hospital Problems: Principal Problem:   Pneumonia Active Problems:   Stage 3 chronic kidney disease (HCC)   PAF (paroxysmal atrial fibrillation) (H[facility])   Benign essential HTN   Hospital course: 1. Cough/ fevers :  1/27 CXR w/ RML infiltrate.  1/28 CT chest showed large R effusion, no gross infiltrates.  Per pharm request changed zosyn to cefepime dye to risk of aki w/ vanc/ zosyn. Check MRSA PCR screen 2. Dysphagia - seen by Speech Rx, poor swallowing, hx CVA and recurrent aspiration events.  They advanced diet to full liquids and recommend pall care consult. Will consult pall care. 3. Nausea / vomiting - resolved. UA negative. Possible infiltrating liver mass by CT here.  Abdomen benign.  Better.  4. Paroxysmal atrial fibrillation: on apixaban.  Patient has a pacemaker. 5. Diastolic CHF takes as needed Lasix.  Last 2D echo done in February 2019 was showing EF of 60 to 65% with grade 2 diastolic dysfunction.  Appears compensated. No vol excess. Holding prn lasix.  6. CKD III - creat 1.3- 1.6 here.  Stable 7. History of CVA with left-sided  hemiparesis on apixaban and statins. 8. Conjunctivitis - abx in L eye, looking better today 9. Neuropathy - Lyrica at renal dosing 10. Altered mental status - feeling/ looks better today 11. Chronic left uretero-hydronephrosis/ hx remote bladder Ca - pt is followed by urology here and also at the [place].  He just saw [facility] urology 1-2 wks ago so will not get urology involved here again.   12. Dispo- will consult P.T., may need SNF rehab.  Came from home.    DVT prophylaxis: Apixaban. Code Status: Full code. Family Communication: No family at the bedside. Disposition Plan: Home when stable. Consults called: Physical therapy. Speech.  Admission status: Observation.  Kelly Splinter MD [facility] pgr [phone] 05/15/2018, 4:24 PM   Imaging:  CT scan abd/ chest/ pelvis 05/13/18 >>    - infiltrating liver mass, worrisome for malignancy   - mod large R effusion, atx, no PNA   - atrophic R kidney, progressive mod severe ureterohydro of L kidney   - bladder wall thickening  Swallow eval 1/29 Clinical Impression             Patient presents with moderate oropharyngeal dysphagia - oral deficits c/b decreased lingual control, lingual pumping, premature spillage of boluses and residuals that pt senses only if not in left buccal region.  SILENT spiration x2 occured during testing - once with nectar as small portion of bolus spilled into open airway before swallow triggered.  Aspiration of suspected TRACE puree pharyngeal residuals (mixed with secretions) also noted just below vocal folds.  Of note, pt did cough once reflexively during MBS, likely due to aspirates reaching carina.  Pt's head in natural chin tuck position which increased oral deficits due to gravity.  Cued dry swallows helpful to decrease oropharyngeal residuals.  Expectoration of pudding residuals from left lateral sulci achieved after completion of study.  As pt reports more problems coughing with solids than liquids and oral  deficits significant, at this time recommend consider full liquid for now and advancing as medically improved.  Pt educated to findings/recommendations and SLP will follow.  In addition, esophageal clearance appeared impaired with suspected tertiary contractions and lack of pt sensation.  Given pt advanced age, stroke related chronic dysphagia/aspiration and recurrent admits with imaging showing concerns for pna - highly recommend palliative consult for Mr [employer].    Swallow Evaluation Recommendations  SLP Diet Recommendations: Thin liquid;Nectar thick liquid(full liquids)  Liquid Administration via: Straw  Medication Administration: Crushed with puree  Supervision: Staff to assist with self feeding;Full supervision/cueing for compensatory strategies  Compensations: Slow rate;Small sips/bites;Multiple dry swallows after each bite/sip;Follow solids with liquid;Other (Comment);Lingual sweep for clearance of pocketing;Minimize environmental distractions(intermittently clear throat/cough)  Postural Changes: Remain semi-upright after after feeds/meals (Comment);Seated upright at 90 degrees  Oral Care Recommendations: Oral care BID  Other Recommendations: Have oral suction available  Recent Labs  Lab 05/12/18 2100 05/13/18 0359 05/15/18  0622  NA 140 140 143  K 4.5 4.0 3.6  CL 106 106 110  CO2 24 24 26   GLUCOSE 98 100* 99  BUN 29* 29* 28*  CREATININE 1.45* 1.35* 1.37*  CALCIUM 9.0 9.1 9.0   Recent Labs  Lab 05/12/18 2100  AST 35  ALT 19  ALKPHOS 284*  BILITOT 1.0  PROT 6.7  ALBUMIN 3.2*   Recent Labs  Lab 05/12/18 2015 05/13/18 0359 05/15/18 0622  WBC 8.5 8.4 7.0  NEUTROABS 6.3  --   --   HGB 14.4 13.7 12.6*  HCT 44.7 42.5 39.5  MCV 91.4 90.4 91.6  PLT 179 173 145*   Iron/TIBC/Ferritin/ %Sat No results found for: IRON, TIBC, FERRITIN, IRONPCTSAT

## 2018-05-16 DIAGNOSIS — Z515 Encounter for palliative care: Secondary | ICD-10-CM

## 2018-05-16 DIAGNOSIS — Z7189 Other specified counseling: Secondary | ICD-10-CM

## 2018-05-16 LAB — CBC
HEMATOCRIT: 39.3 % (ref 39.0–52.0)
Hemoglobin: 12.6 g/dL — ABNORMAL LOW (ref 13.0–17.0)
MCH: 29.5 pg (ref 26.0–34.0)
MCHC: 32.1 g/dL (ref 30.0–36.0)
MCV: 92 fL (ref 80.0–100.0)
Platelets: 149 10*3/uL — ABNORMAL LOW (ref 150–400)
RBC: 4.27 MIL/uL (ref 4.22–5.81)
RDW: 16 % — ABNORMAL HIGH (ref 11.5–15.5)
WBC: 8.4 10*3/uL (ref 4.0–10.5)
nRBC: 0 % (ref 0.0–0.2)

## 2018-05-16 LAB — BASIC METABOLIC PANEL
Anion gap: 7 (ref 5–15)
BUN: 23 mg/dL (ref 8–23)
CHLORIDE: 113 mmol/L — AB (ref 98–111)
CO2: 25 mmol/L (ref 22–32)
Calcium: 8.9 mg/dL (ref 8.9–10.3)
Creatinine, Ser: 1.39 mg/dL — ABNORMAL HIGH (ref 0.61–1.24)
GFR calc Af Amer: 53 mL/min — ABNORMAL LOW (ref >60)
GFR calc non Af Amer: 46 mL/min — ABNORMAL LOW (ref >60)
GLUCOSE: 113 mg/dL — AB (ref 70–99)
Potassium: 3.5 mmol/L (ref 3.5–5.1)
Sodium: 145 mmol/L (ref 135–145)

## 2018-05-16 NOTE — Progress Notes (Signed)
SLP Cancellation Note  Patient Details Name: JONG RICKMAN MRN: [id number] DOB: 02/14/1931   Cancelled treatment:       Reason Eval/Treat Not Completed: Other (comment)(c/pt fatigued, sleeping currently, will continue efforts)   Macario Golds 05/16/2018, 2:57 PM   Luanna Salk, [facility] SLP Acute Rehab Services Pager [phone] Office [phone]

## 2018-05-16 NOTE — Progress Notes (Addendum)
PROGRESS NOTE    Derek Hays  VZD:[id number] DOB: 01-02-1931 DOA: 05/12/2018 [facility]    Brief Narrative:  Derek Hays a 85 y.o.malewithhistory of CVA with left-sided hemiparesis essentially bedbound, hypertension, atrial fibrillation, diastolic CHF, postoperative hypothyroidism was referred to the ER after patient's home health care nurse found that patient has been having productive cough for last 3 days discolored sputum with weakness and also had some episodes of nausea vomiting last 2 days and poor appetite. Patient is a poor historian to provide much history. Most of the history was obtained from the ER physician.   ED Course:In the ER chest x-ray shows infiltrates concerning for pneumonia. Note that patient was admitted last month at [facility] teaching service for pneumonia of the right middle lobe which has been persistent in the chest x-ray. Labs revealed creatinine 1.4 WBC count of 8.5 lactate of 1.6 BNP of 201 troponin less than 0.03 and EKG showing paced rhythm. Given the infiltrates with cough concerning for pneumonia patient was started on antibiotics. Abdomen appears benign. Alkaline phosphatase was 284. Patient had a CAT scan done last admission which showed some left-sided kidney fullness with bladder wall thickening with history of previous bladder cancer this was concerning.  Assessment & Plan:   Principal Problem:   Pneumonia Active Problems:   Stage 3 chronic kidney disease (HCC)   PAF (paroxysmal atrial fibrillation) (HCC)   Benign essential HTN   Goals of care, counseling/discussion   Palliative care encounter   Acute metabolic encephalopathy Pneumonia, suspect gram-negative organism Patient was admitted with AMS found to have a right middle lobe infiltrate per chest x-ray 05/12/2018.  CT chest notable for large right pleural effusion.  Initially started on broad-spectrum antibiotics with vancomycin and Zosyn, pharmacy  recommended change of Zosyn to cefepime for renal function.  MRSA PCR negative, will discontinue vancomycin today. --Continue cefepime  Moderate oral pharyngeal dysphasia Therapy following, recommend thin liquids and medications crushed with pure.  Palliative care following, plan to meet with spouse today.  Left eye conjunctivitis Eye continues to be matted. --Continue otic antibiotics with Polytrim  Liver lesion A ill-defined density within the central liver was noted on admission CT.  Questionable infiltrating mass.  May need further work-up outpatient with a dedicated CT abdomen with contrast versus MRI liver.  Paroxysmal atrial fibrillation Chronic diastolic congestive heart failure Sp PPM TTE February 2019 with EF 60-65% with grade 2 diastolic dysfunction.  Currently compensated. --Continue chronic anticoagulation with apixaban  CKD stage III Creatinine stable, between 1.3-1.6 during hospitalization. --Continue to renally dose all medications.  History of CVA with residual left-sided hemiparesis --Continue apixaban, and statin  Neuropathy: Continue Lyrica  Chronic left ureterohydronephrosis History of remote bladder cancer Patient follows with urology at [facility] and at the [place].  Seen by [facility] urology 1-2 weeks ago.  It appears chronic without any acute issues.  Creatinine stable.  Recommend continue outpatient follow-up.   DVT prophylaxis: Apixaban Code Status: Full code Family Communication: None Disposition Plan: PT recommends SNF versus home if family able, case management for coordination   Consultants:   Palliative care  Physical therapy  Speech therapy  Procedures:   None  Antimicrobials:   Vancomycin -discontinued 05/16/2018  Cefepime   Subjective:  Patient seen and examined at bedside.  Lying comfortably in bed.  Left eye continues to be matted.  Denies any complaints this morning.  No family present.   Objective: Vitals:   05/16/18 0604  05/16/18 2951 05/16/18  1511 05/16/18 1519  BP: 134/74 104/64 139/83   Pulse: 60 66 60   Resp: 15 15 15    Temp: (!) 97.5 F (36.4 C) (!) 97.2 F (36.2 C) (!) 93.2 F (34 C) (!) 97.4 F (36.3 C)  TempSrc: Oral Oral Axillary Axillary  SpO2: 98% 99% 100%   Weight:      Height:        Intake/Output Summary (Last 24 hours) at 05/16/2018 1633 Last data filed at 05/16/2018 1500 Gross per 24 hour  Intake 686.82 ml  Output 250 ml  Net 436.82 ml   Filed Weights   05/12/18 1925 05/15/18 0600 05/16/18 0500  Weight: 87.1 kg 94.1 kg 92.9 kg    Examination:  General exam: Appears calm and comfortable  HEENT: Left eye matted shut with yellow drainage Respiratory system: Clear to auscultation. Respiratory effort normal. Cardiovascular system: S1 & S2 heard, RRR. No JVD, murmurs, rubs, gallops or clicks. No pedal edema. Gastrointestinal system: Abdomen is nondistended, soft and nontender. No organomegaly or masses felt. Normal bowel sounds heard. Central nervous system: Alert and oriented. No focal neurological deficits. Extremities: Left-sided upper extremity weakness noted, otherwise unremarkable Skin: No rashes, lesions or ulcers Psychiatry: Judgement and insight appear normal. Mood & affect appropriate.     Data Reviewed: I have personally reviewed following labs and imaging studies  CBC: Recent Labs  Lab 05/12/18 2015 05/13/18 0359 05/15/18 0622 05/16/18 0626  WBC 8.5 8.4 7.0 8.4  NEUTROABS 6.3  --   --   --   HGB 14.4 13.7 12.6* 12.6*  HCT 44.7 42.5 39.5 39.3  MCV 91.4 90.4 91.6 92.0  PLT 179 173 145* 737*   Basic Metabolic Panel: Recent Labs  Lab 05/12/18 2100 05/13/18 0359 05/15/18 0622 05/16/18 0626  NA 140 140 143 145  K 4.5 4.0 3.6 3.5  CL 106 106 110 113*  CO2 24 24 26 25   GLUCOSE 98 100* 99 113*  BUN 29* 29* 28* 23  CREATININE 1.45* 1.35* 1.37* 1.39*  CALCIUM 9.0 9.1 9.0 8.9   GFR: Estimated Creatinine Clearance: 42.6 mL/min (A) (by C-G formula based  on SCr of 1.39 mg/dL (H)). Liver Function Tests: Recent Labs  Lab 05/12/18 2100  AST 35  ALT 19  ALKPHOS 284*  BILITOT 1.0  PROT 6.7  ALBUMIN 3.2*   No results for input(s): LIPASE, AMYLASE in the last 168 hours. No results for input(s): AMMONIA in the last 168 hours. Coagulation Profile: No results for input(s): INR, PROTIME in the last 168 hours. Cardiac Enzymes: Recent Labs  Lab 05/12/18 2100  TROPONINI <0.03   BNP (last 3 results) No results for input(s): PROBNP in the last 8760 hours. HbA1C: No results for input(s): HGBA1C in the last 72 hours. CBG: Recent Labs  Lab 05/12/18 2043  GLUCAP 77   Lipid Profile: No results for input(s): CHOL, HDL, LDLCALC, TRIG, CHOLHDL, LDLDIRECT in the last 72 hours. Thyroid Function Tests: No results for input(s): TSH, T4TOTAL, FREET4, T3FREE, THYROIDAB in the last 72 hours. Anemia Panel: No results for input(s): VITAMINB12, FOLATE, FERRITIN, TIBC, IRON, RETICCTPCT in the last 72 hours. Sepsis Labs: Recent Labs  Lab 05/12/18 2015  LATICACIDVEN 1.6    Recent Results (from the past 240 hour(s))  Urine culture     Status: Abnormal   Collection Time: 05/12/18  9:28 PM  Result Value Ref Range Status   Specimen Description   Final    URINE, CLEAN CATCH Performed at [facility], [address] [place], [place] [address]    Special Requests   Final    NONE Performed at [facility], [place] [address] [place], [place] [address]    Culture MULTIPLE SPECIES PRESENT, SUGGEST RECOLLECTION (A)  Final   Report Status 05/14/2018 FINAL  Final  Culture, blood (routine x 2)     Status: Abnormal   Collection Time: 05/12/18  9:28 PM  Result Value Ref Range Status   Specimen Description   Final    BLOOD BLOOD RIGHT FOREARM Performed at [facility] [address] [place], [place] [address]    Special Requests   Final    BOTTLES DRAWN AEROBIC AND ANAEROBIC Blood Culture adequate  volume Performed at [facility] [address], [place], [place] [address]    Culture  Setup Time   Final    IN BOTH AEROBIC AND ANAEROBIC BOTTLES GRAM POSITIVE COCCI CRITICAL RESULT CALLED TO, READ BACK BY AND VERIFIED WITHLavell Luster [facility] 0354 05/14/18 A BROWNING    Culture (A)  Final    STAPHYLOCOCCUS SPECIES (COAGULASE NEGATIVE) THE SIGNIFICANCE OF ISOLATING THIS ORGANISM FROM A SINGLE SET OF BLOOD CULTURES WHEN MULTIPLE SETS ARE DRAWN IS UNCERTAIN. PLEASE NOTIFY THE MICROBIOLOGY DEPARTMENT WITHIN ONE WEEK IF SPECIATION AND SENSITIVITIES ARE REQUIRED. Performed at [facility], [place] [address], [place], [place] [address]    Report Status 05/15/2018 FINAL  Final  Blood Culture ID Panel (Reflexed)     Status: Abnormal   Collection Time: 05/12/18  9:28 PM  Result Value Ref Range Status   Enterococcus species NOT DETECTED NOT DETECTED Final   Listeria monocytogenes NOT DETECTED NOT DETECTED Final   Staphylococcus species DETECTED (A) NOT DETECTED Final    Comment: Methicillin (oxacillin) resistant coagulase negative staphylococcus. Possible blood culture contaminant (unless isolated from more than one blood culture draw or clinical case suggests pathogenicity). No antibiotic treatment is indicated for blood  culture contaminants. CRITICAL RESULT CALLED TO, READ BACK BY AND VERIFIED WITHLavell Luster [facility] 2751 05/14/18 A BROWNING    Staphylococcus aureus (BCID) NOT DETECTED NOT DETECTED Final   Methicillin resistance DETECTED (A) NOT DETECTED Final    Comment: CRITICAL RESULT CALLED TO, READ BACK BY AND VERIFIED WITH: Lavell Luster PHARMD 7001 05/14/18 A BROWNING    Streptococcus species NOT DETECTED NOT DETECTED Final   Streptococcus agalactiae NOT DETECTED NOT DETECTED Final   Streptococcus pneumoniae NOT DETECTED NOT DETECTED Final   Streptococcus pyogenes NOT DETECTED NOT DETECTED Final   Acinetobacter baumannii NOT DETECTED NOT DETECTED Final    Enterobacteriaceae species NOT DETECTED NOT DETECTED Final   Enterobacter cloacae complex NOT DETECTED NOT DETECTED Final   Escherichia coli NOT DETECTED NOT DETECTED Final   Klebsiella oxytoca NOT DETECTED NOT DETECTED Final   Klebsiella pneumoniae NOT DETECTED NOT DETECTED Final   Proteus species NOT DETECTED NOT DETECTED Final   Serratia marcescens NOT DETECTED NOT DETECTED Final   Haemophilus influenzae NOT DETECTED NOT DETECTED Final   Neisseria meningitidis NOT DETECTED NOT DETECTED Final   Pseudomonas aeruginosa NOT DETECTED NOT DETECTED Final   Candida albicans NOT DETECTED NOT DETECTED Final   Candida glabrata NOT DETECTED NOT DETECTED Final   Candida krusei NOT DETECTED NOT DETECTED Final   Candida parapsilosis NOT DETECTED NOT DETECTED Final   Candida tropicalis NOT DETECTED NOT DETECTED Final    Comment: Performed at [facility], [place], [place],  [address]  Culture, blood (routine  x 2)     Status: None (Preliminary result)   Collection Time: 05/12/18  9:29 PM  Result Value Ref Range Status   Specimen Description   Final    BLOOD RIGHT ANTECUBITAL Performed at [facility] [address], [place], [place] [address]    Special Requests   Final    AEROBIC BOTTLE ONLY Blood Culture results may not be optimal due to an inadequate volume of blood received in culture bottles Performed at [place] [address] [place], [place] [address]    Culture   Final    NO GROWTH 3 DAYS Performed at [facility], [place] [address], [place], [place] [address]    Report Status PENDING  Incomplete  MRSA PCR Screening     Status: Abnormal   Collection Time: 05/13/18  3:59 AM  Result Value Ref Range Status   MRSA by PCR POSITIVE (A) NEGATIVE Final    Comment:        The GeneXpert MRSA Assay (FDA approved for NASAL specimens only), is one component of a comprehensive MRSA colonization surveillance program. It is  not intended to diagnose MRSA infection nor to guide or monitor treatment for MRSA infections. CRITICAL RESULT CALLED TO, READ BACK BY AND VERIFIED WITH: RN [facility] AT 6283 05/13/18 [place] A. Performed at [facility], [place] [address], [place], [place] [address]          Radiology Studies: No results found.      Scheduled Meds: . apixaban  5 mg Oral BID  . bethanechol  25 mg Oral 3 times per day  . Chlorhexidine Gluconate Cloth  6 each Topical Q0600  . cholecalciferol  2,000 Units Oral Daily  . folic acid  1 mg Oral Daily  . latanoprost  1 drop Both Eyes QHS  . levothyroxine  137 mcg Oral Q0600  . mupirocin ointment  1 application Nasal BID  . pregabalin  150 mg Oral BID  . tamsulosin  0.4 mg Oral QHS  . trimethoprim-polymyxin b  1 drop Left Eye Q4H   Continuous Infusions: . ceFEPime (MAXIPIME) IV Stopped (05/16/18 1007)     LOS: 3 days    Time spent: 7 min    Eric J [place] MD [facility] Pager [phone] If 7PM-7AM, please contact night-coverage [email or web address] Password [facility] 05/16/2018, 4:33 PM

## 2018-05-16 NOTE — Evaluation (Signed)
Physical Therapy Evaluation Patient Details Name: Derek Hays MRN: [id number] DOB: 1930-06-06 Today's Date: 05/16/2018   History of Present Illness  Patient is an 85 y/o male presenting to the ED on 05/12/18 with primary complaints of cough, fatigue. Past history of CVA with left-sided hemiparesis essentially bedbound, hypertension, atrial fibrillation, diastolic CHF, postoperative hypothyroidism. Admitted for PNA work-up.    Clinical Impression  Patient admitted with the above listed diagnosis. Per chart review and subjective reports from patient, patient required assist for all mobility and ADLs prior to admission - per report he was receiving HHPT recently and was sitting bedside with PT assist. Patient today requiring Total A +2 for rolling R<>L with patient attempting to assist with R LE/UE. Will recommend SNF vs return home with caregiver/aide if able to continue this level of required care.     Follow Up Recommendations SNF(vs home with caregiver/aide)    Equipment Recommendations  None recommended by PT    Recommendations for Other Services       Precautions / Restrictions Precautions Precautions: Fall Restrictions Weight Bearing Restrictions: No      Mobility  Bed Mobility Overal bed mobility: Needs Assistance Bed Mobility: Rolling Rolling: Total assist;+2 for physical assistance         General bed mobility comments: Total A+2 for rolling R<>L  Transfers                 General transfer comment: deferred  Ambulation/Gait                Stairs            Wheelchair Mobility    Modified Rankin (Stroke Patients Only)       Balance                                             Pertinent Vitals/Pain Pain Assessment: No/denies pain    Home Living Family/patient expects to be discharged to:: Private residence Living Arrangements: Spouse/significant other Available Help at Discharge: Family;Personal care  attendant;Available 24 hours/day Type of Home: House Home Access: Ramped entrance     Home Layout: One level Home Equipment: Bedside commode;Grab bars - tub/shower;Grab bars - toilet;Walker - 2 wheels;Cane - single point;Crutches;Wheelchair Administrator, sports (comment)(Hoyer (mechanical lift))      Prior Function Level of Independence: Needs assistance   Gait / Transfers Assistance Needed: non ambulatory but rolls wc with his RLE  ADL's / Homemaking Assistance Needed: total care for dressing and bathing        Hand Dominance   Dominant Hand: Right    Extremity/Trunk Assessment   Upper Extremity Assessment Upper Extremity Assessment: Defer to OT evaluation    Lower Extremity Assessment Lower Extremity Assessment: Generalized weakness;LLE deficits/detail LLE Deficits / Details: hemiparetic - unable to actively move independently       Communication   Communication: HOH  Cognition Arousal/Alertness: Awake/alert Behavior During Therapy: WFL for tasks assessed/performed Overall Cognitive Status: No family/caregiver present to determine baseline cognitive functioning                                        General Comments      Exercises     Assessment/Plan    PT Assessment Patient needs continued PT services  PT Problem  List Decreased strength;Decreased activity tolerance;Decreased balance;Decreased mobility;Decreased knowledge of use of DME;Decreased safety awareness       PT Treatment Interventions DME instruction;Functional mobility training;Therapeutic activities;Therapeutic exercise;Balance training;Neuromuscular re-education;Patient/family education    PT Goals (Current goals can be found in the Care Plan section)  Acute Rehab PT Goals Patient Stated Goal: return home tomorrow PT Goal Formulation: With patient Time For Goal Achievement: 05/30/18 Potential to Achieve Goals: Fair    Frequency Min 2X/week   Barriers to discharge         Co-evaluation               AM-PAC PT "6 Clicks" Mobility  Outcome Measure Help needed turning from your back to your side while in a flat bed without using bedrails?: Total Help needed moving from lying on your back to sitting on the side of a flat bed without using bedrails?: Total Help needed moving to and from a bed to a chair (including a wheelchair)?: Total Help needed standing up from a chair using your arms (e.g., wheelchair or bedside chair)?: Total Help needed to walk in hospital room?: Total Help needed climbing 3-5 steps with a railing? : Total 6 Click Score: 6    End of Session   Activity Tolerance: Patient tolerated treatment well Patient left: in bed;with call bell/phone within reach;with nursing/sitter in room Nurse Communication: Mobility status PT Visit Diagnosis: Unsteadiness on feet (R26.81)    Time: 9093-1121 PT Time Calculation (min) (ACUTE ONLY): 20 min   Charges:   PT Evaluation $PT Eval Moderate Complexity: 1 Mod         Lanney Gins, PT, DPT Supplemental Physical Therapist 05/16/18 12:28 PM Pager: [phone] Office: [phone]

## 2018-05-17 LAB — BASIC METABOLIC PANEL WITH GFR
Anion gap: 9 (ref 5–15)
BUN: 20 mg/dL (ref 8–23)
CO2: 24 mmol/L (ref 22–32)
Calcium: 9 mg/dL (ref 8.9–10.3)
Chloride: 111 mmol/L (ref 98–111)
Creatinine, Ser: 1.3 mg/dL — ABNORMAL HIGH (ref 0.61–1.24)
GFR calc Af Amer: 58 mL/min — ABNORMAL LOW
GFR calc non Af Amer: 50 mL/min — ABNORMAL LOW
Glucose, Bld: 98 mg/dL (ref 70–99)
Potassium: 3.5 mmol/L (ref 3.5–5.1)
Sodium: 144 mmol/L (ref 135–145)

## 2018-05-17 LAB — CBC
HCT: 40.8 % (ref 39.0–52.0)
Hemoglobin: 12.8 g/dL — ABNORMAL LOW (ref 13.0–17.0)
MCH: 28.3 pg (ref 26.0–34.0)
MCHC: 31.4 g/dL (ref 30.0–36.0)
MCV: 90.3 fL (ref 80.0–100.0)
Platelets: 165 10*3/uL (ref 150–400)
RBC: 4.52 MIL/uL (ref 4.22–5.81)
RDW: 15.9 % — ABNORMAL HIGH (ref 11.5–15.5)
WBC: 7.8 10*3/uL (ref 4.0–10.5)
nRBC: 0 % (ref 0.0–0.2)

## 2018-05-17 MED ORDER — OXYCODONE HCL 5 MG/5ML PO SOLN
2.5000 mg | Freq: Four times a day (QID) | ORAL | Status: DC | PRN
  Administered 2018-05-17: 2.5 mg via ORAL
  Filled 2018-05-17 (×2): qty 5

## 2018-05-17 NOTE — Progress Notes (Signed)
PROGRESS NOTE    Derek Hays  TDD:[id number] DOB: January 18, 1931 DOA: 05/12/2018 [facility]    Brief Narrative: Derek Hays a 85 y.o.malewithhistory of CVA with left-sided hemiparesis essentially bedbound, hypertension, atrial fibrillation, diastolic CHF, postoperative hypothyroidism was referred to the ER after patient's home health care nurse found that patient has been having productive cough for last 3 days discolored sputum with weakness and also had some episodes of nausea vomiting last 2 days and poor appetite he was admitted for pneumonia   Assessment & Plan:   Principal Problem:   Pneumonia Active Problems:   Stage 3 chronic kidney disease (HCC)   PAF (paroxysmal atrial fibrillation) (HCC)   Benign essential HTN   Goals of care, counseling/discussion   Palliative care encounter   Right middle lobe pneumonia CT chest notable for right pleural effusion he was initially started on broad-spectrum antibiotics with IV vancomycin and Zosyn later on transition to cefepime.  Patient breathing has improved but he is not back to baseline yet.    Acute metabolic encephalopathy probably secondary to pneumonia He is improving he is alert and oriented to place and person.   Moderate oropharyngeal dysphagia Speech pathology on board and palliative care consulted.   Left eye conjunctivitis continue with otic antibiotics with Polytrim.   Paroxysmal atrial fibrillation Rate controlled continue with apixaban for anticoagulation.    History of chronic diastolic heart failure He appears to be compensated.   Stage III CKD Creatinine appears to be at baseline.   History of CVA with residual left-sided hemiparesis continue with apixaban and statin  Ill-defined lesion in the liver on CT Incidental finding further work-up with MRI of the liver ordered  History of chronic left hydro-ureteronephrosis with remote bladder cancer history Patient follows  up with urology at [facility] no new recommendations at this time.  DVT prophylaxis: Apixaban Code Status: (Full code Family Communication: None at bedside today Disposition Plan: Pending clinical improvement and palliative discussion with the family.   Consultants:   Palliative care consult  Procedures: MRI of the liver  Antimicrobials: Cefepime since admission   Subjective: Patient reports feeling and breathing better but he is not back to baseline yet he is tachypneic on talking  Objective: Vitals:   05/17/18 0526 05/17/18 0858 05/17/18 1500 05/17/18 1606  BP: (!) 151/94 (!) 152/99 (!) 150/101 (!) 157/93  Pulse: (!) 59 60 68 (!) 58  Resp: 20 18 18 20   Temp: 98.3 F (36.8 C) 98 F (36.7 C) 98.1 F (36.7 C) 97.8 F (36.6 C)  TempSrc:  Oral Oral Oral  SpO2: 94% 100% 94% 100%  Weight:      Height:        Intake/Output Summary (Last 24 hours) at 05/17/2018 1645 Last data filed at 05/17/2018 1600 Gross per 24 hour  Intake 356 ml  Output 1150 ml  Net -794 ml   Filed Weights   05/15/18 0600 05/16/18 0500 05/17/18 0500  Weight: 94.1 kg 92.9 kg 93.6 kg    Examination:  General exam: Appears calm and comfortable  Respiratory system: Air entry fair scattered rhonchi at bases Cardiovascular system: S1 & S2 heard, RRR. No JVD, murmurs, rubs, gallops or clicks. No pedal edema. Gastrointestinal system: Abdomen is nondistended, soft and nontender.  Bowel sounds are good Central nervous system: Alert and oriented. No focal neurological deficits. Extremities: Symmetric 5 x 5 power. Skin: No rashes, lesions or ulcers Psychiatry:Mood & affect appropriate.     Data Reviewed: I have personally  reviewed following labs and imaging studies  CBC: Recent Labs  Lab 05/12/18 2015 05/13/18 0359 05/15/18 0622 05/16/18 0626 05/17/18 0605  WBC 8.5 8.4 7.0 8.4 7.8  NEUTROABS 6.3  --   --   --   --   HGB 14.4 13.7 12.6* 12.6* 12.8*  HCT 44.7 42.5 39.5 39.3 40.8  MCV 91.4 90.4 91.6 92.0  90.3  PLT 179 173 145* 149* 275   Basic Metabolic Panel: Recent Labs  Lab 05/12/18 2100 05/13/18 0359 05/15/18 0622 05/16/18 0626 05/17/18 0605  NA 140 140 143 145 144  K 4.5 4.0 3.6 3.5 3.5  CL 106 106 110 113* 111  CO2 24 24 26 25 24   GLUCOSE 98 100* 99 113* 98  BUN 29* 29* 28* 23 20  CREATININE 1.45* 1.35* 1.37* 1.39* 1.30*  CALCIUM 9.0 9.1 9.0 8.9 9.0   GFR: Estimated Creatinine Clearance: 49.4 mL/min (A) (by C-G formula based on SCr of 1.3 mg/dL (H)). Liver Function Tests: Recent Labs  Lab 05/12/18 2100  AST 35  ALT 19  ALKPHOS 284*  BILITOT 1.0  PROT 6.7  ALBUMIN 3.2*   No results for input(s): LIPASE, AMYLASE in the last 168 hours. No results for input(s): AMMONIA in the last 168 hours. Coagulation Profile: No results for input(s): INR, PROTIME in the last 168 hours. Cardiac Enzymes: Recent Labs  Lab 05/12/18 2100  TROPONINI <0.03   BNP (last 3 results) No results for input(s): PROBNP in the last 8760 hours. HbA1C: No results for input(s): HGBA1C in the last 72 hours. CBG: Recent Labs  Lab 05/12/18 2043  GLUCAP 77   Lipid Profile: No results for input(s): CHOL, HDL, LDLCALC, TRIG, CHOLHDL, LDLDIRECT in the last 72 hours. Thyroid Function Tests: No results for input(s): TSH, T4TOTAL, FREET4, T3FREE, THYROIDAB in the last 72 hours. Anemia Panel: No results for input(s): VITAMINB12, FOLATE, FERRITIN, TIBC, IRON, RETICCTPCT in the last 72 hours. Sepsis Labs: Recent Labs  Lab 05/12/18 2015  LATICACIDVEN 1.6    Recent Results (from the past 240 hour(s))  Urine culture     Status: Abnormal   Collection Time: 05/12/18  9:28 PM  Result Value Ref Range Status   Specimen Description   Final    URINE, CLEAN CATCH Performed at [facility], [place] [address], [place], [place] [address]    Special Requests   Final    NONE Performed at [facility], [place] [address], [place], [place] [address]    Culture MULTIPLE  SPECIES PRESENT, SUGGEST RECOLLECTION (A)  Final   Report Status 05/14/2018 FINAL  Final  Culture, blood (routine x 2)     Status: Abnormal   Collection Time: 05/12/18  9:28 PM  Result Value Ref Range Status   Specimen Description   Final    BLOOD BLOOD RIGHT FOREARM Performed at [facility] [address], [place], [place] [address]    Special Requests   Final    BOTTLES DRAWN AEROBIC AND ANAEROBIC Blood Culture adequate volume Performed at [facility] [address] [place],  [address]    Culture  Setup Time   Final    IN BOTH AEROBIC AND ANAEROBIC BOTTLES GRAM POSITIVE COCCI CRITICAL RESULT CALLED TO, READ BACK BY AND VERIFIED WITHLavell Luster [facility] 6659 05/14/18 A BROWNING    Culture (A)  Final    STAPHYLOCOCCUS SPECIES (COAGULASE NEGATIVE) THE SIGNIFICANCE OF ISOLATING THIS ORGANISM FROM A SINGLE SET OF BLOOD CULTURES WHEN  MULTIPLE SETS ARE DRAWN IS UNCERTAIN. PLEASE NOTIFY THE MICROBIOLOGY DEPARTMENT WITHIN ONE WEEK IF SPECIATION AND SENSITIVITIES ARE REQUIRED. Performed at [facility], [place] [address] [place], [place] [address]    Report Status 05/15/2018 FINAL  Final  Blood Culture ID Panel (Reflexed)     Status: Abnormal   Collection Time: 05/12/18  9:28 PM  Result Value Ref Range Status   Enterococcus species NOT DETECTED NOT DETECTED Final   Listeria monocytogenes NOT DETECTED NOT DETECTED Final   Staphylococcus species DETECTED (A) NOT DETECTED Final    Comment: Methicillin (oxacillin) resistant coagulase negative staphylococcus. Possible blood culture contaminant (unless isolated from more than one blood culture draw or clinical case suggests pathogenicity). No antibiotic treatment is indicated for blood  culture contaminants. CRITICAL RESULT CALLED TO, READ BACK BY AND VERIFIED WITHLavell Luster [facility] 5170 05/14/18 A BROWNING    Staphylococcus aureus (BCID) NOT DETECTED NOT DETECTED Final   Methicillin resistance  DETECTED (A) NOT DETECTED Final    Comment: CRITICAL RESULT CALLED TO, READ BACK BY AND VERIFIED WITH: Lavell Luster PHARMD 0174 05/14/18 A BROWNING    Streptococcus species NOT DETECTED NOT DETECTED Final   Streptococcus agalactiae NOT DETECTED NOT DETECTED Final   Streptococcus pneumoniae NOT DETECTED NOT DETECTED Final   Streptococcus pyogenes NOT DETECTED NOT DETECTED Final   Acinetobacter baumannii NOT DETECTED NOT DETECTED Final   Enterobacteriaceae species NOT DETECTED NOT DETECTED Final   Enterobacter cloacae complex NOT DETECTED NOT DETECTED Final   Escherichia coli NOT DETECTED NOT DETECTED Final   Klebsiella oxytoca NOT DETECTED NOT DETECTED Final   Klebsiella pneumoniae NOT DETECTED NOT DETECTED Final   Proteus species NOT DETECTED NOT DETECTED Final   Serratia marcescens NOT DETECTED NOT DETECTED Final   Haemophilus influenzae NOT DETECTED NOT DETECTED Final   Neisseria meningitidis NOT DETECTED NOT DETECTED Final   Pseudomonas aeruginosa NOT DETECTED NOT DETECTED Final   Candida albicans NOT DETECTED NOT DETECTED Final   Candida glabrata NOT DETECTED NOT DETECTED Final   Candida krusei NOT DETECTED NOT DETECTED Final   Candida parapsilosis NOT DETECTED NOT DETECTED Final   Candida tropicalis NOT DETECTED NOT DETECTED Final    Comment: Performed at [facility], [place] [address] [place], [place] [address]  Culture, blood (routine x 2)     Status: None (Preliminary result)   Collection Time: 05/12/18  9:29 PM  Result Value Ref Range Status   Specimen Description BLOOD RIGHT ANTECUBITAL  Final   Special Requests   Final    AEROBIC BOTTLE ONLY Blood Culture results may not be optimal due to an inadequate volume of blood received in culture bottles Performed at [facility], [place] [address], [place],  [address]    Culture NO GROWTH 4 DAYS  Final   Report Status PENDING  Incomplete  MRSA PCR Screening     Status: Abnormal   Collection Time:  05/13/18  3:59 AM  Result Value Ref Range Status   MRSA by PCR POSITIVE (A) NEGATIVE Final    Comment:        The GeneXpert MRSA Assay (FDA approved for NASAL specimens only), is one component of a comprehensive MRSA colonization surveillance program. It is not intended to diagnose MRSA infection nor to guide or monitor treatment for MRSA infections. CRITICAL RESULT CALLED TO, READ BACK BY AND VERIFIED WITH: RN [facility], [place] [facility] AT 3846 05/13/18 Effie A. Performed at [facility], [place] [address] [place],  [place] [address]          Radiology Studies: No results found.      Scheduled Meds: . apixaban  5 mg Oral BID  . bethanechol  25 mg Oral 3 times per day  . Chlorhexidine Gluconate Cloth  6 each Topical Q0600  . cholecalciferol  2,000 Units Oral Daily  . folic acid  1 mg Oral Daily  . latanoprost  1 drop Both Eyes QHS  . levothyroxine  137 mcg Oral Q0600  . mupirocin ointment  1 application Nasal BID  . pregabalin  150 mg Oral BID  . tamsulosin  0.4 mg Oral QHS  . trimethoprim-polymyxin b  1 drop Left Eye Q4H   Continuous Infusions: . ceFEPime (MAXIPIME) IV 1 g (05/17/18 1040)     LOS: 4 days    Time spent: 34 minutes    Hosie Poisson, MD [facility] Pager [phone]  If 7PM-7AM, please contact night-coverage [email or web address] Password [facility] 05/17/2018, 4:45 PM

## 2018-05-17 NOTE — Progress Notes (Signed)
Daily Progress Note   Patient Name: Derek Hays       Date: 05/17/2018 DOB: 22-Oct-1930  Age: 85 y.o. MRN#: [id number] Attending Physician: Hosie Poisson, MD Primary Care Physician: [facility] Admit Date: 05/12/2018  Reason for Consultation/Follow-up: Establishing goals of care  Subjective: I saw and examined Derek Hays today.  He is mildly confused and does not remember talking with me at all yesterday.  I called and was able to reach his wife via phone.  She reports that her husband has continued to have decline in his nutrition, cognition, and functional status since having a stroke.  We discussed recurrent hospitalizations for aspiration pneumonia and concerned that this will continue to be a problem moving forward.  We also discussed concern noted in December for potential recurrence of bladder cancer as well as new liver finding.  We talked about the goal of the hospital being to add as much time in quality to his life outside of the hospital, however, we may be reaching a point where continued returns to the hospital for repeated aspiration pneumonia may cause him more distress than benefit.    She reports being appreciative of the information, however, she feels that continuation of current interventions is what her husband would want at this time.  She reports that they need to be [facility] for [facility] of possible recurrence of his bladder cancer.    She is agreeable to me calling back tomorrow to continue conversation.  Length of Stay: 4  Current Medications: Scheduled Meds:  . apixaban  5 mg Oral BID  . bethanechol  25 mg Oral 3 times per day  . Chlorhexidine Gluconate Cloth  6 each Topical Q0600  . cholecalciferol   2,000 Units Oral Daily  . folic acid  1 mg Oral Daily  . latanoprost  1 drop Both Eyes QHS  . levothyroxine  137 mcg Oral Q0600  . mupirocin ointment  1 application Nasal BID  . pregabalin  150 mg Oral BID  . tamsulosin  0.4 mg Oral QHS  . trimethoprim-polymyxin b  1 drop Left Eye Q4H    Continuous Infusions: . ceFEPime (MAXIPIME) IV Stopped (05/16/18 2129)    PRN Meds: acetaminophen **OR** acetaminophen, albuterol, ondansetron **OR** ondansetron (ZOFRAN) IV, polyethylene  glycol, polyvinyl alcohol  Physical Exam        General: elderly male resting in bed Lungs: CTAB Cardiac: regular rate and rhythm Extremities: mild edema in lower extremities, L>R  Vital Signs: BP (!) 151/94 (BP Location: Right Arm)   Pulse (!) 59   Temp 98.3 F (36.8 C)   Resp 20   Ht 6' (1.829 m)   Wt 93.6 kg   SpO2 94%   BMI 27.99 kg/m  SpO2: SpO2: 94 % O2 Device: O2 Device: Room Air O2 Flow Rate:    Intake/output summary:   Intake/Output Summary (Last 24 hours) at 05/17/2018 4709 Last data filed at 05/17/2018 6283 Gross per 24 hour  Intake 1186.82 ml  Output 700 ml  Net 486.82 ml   LBM: Last BM Date: 05/16/18 Baseline Weight: Weight: 87.1 kg Most recent weight: Weight: 93.6 kg       Palliative Assessment/Data:    Flowsheet Rows     Most Recent Value  Intake Tab  Referral Department  Hospitalist  Unit at Time of Referral  Med/Surg Unit  Palliative Care Primary Diagnosis  Cancer  Date Notified  05/15/18  Palliative Care Type  Return patient Palliative Care  Reason for referral  Clarify Goals of Care  Date of Admission  05/12/18  # of days IP prior to Palliative referral  3  Clinical Assessment  Psychosocial & Spiritual Assessment  Palliative Care Outcomes      Patient Active Problem List   Diagnosis Date Noted  . Goals of care, counseling/discussion   . Palliative care encounter   . Pneumonia 05/12/2018  . Urinary retention 04/02/2018  . CAP (community acquired pneumonia)  1January 17, 202019  . Permanent atrial fibrillation   . SIRS (systemic inflammatory response syndrome) ([place]) 11/05/2017  . Urinary tract infection 09/23/2017  . Acute respiratory failure with hypoxia ([place]) 09/23/2017  . Oropharyngeal dysphagia 09/23/2017  . Palliative care by specialist   . Sepsis due to urinary tract infection ([place])   . HCAP (healthcare-associated pneumonia)   . Altered mental status 08/10/2017  . Acute blood loss anemia   . Stage 3 chronic kidney disease ([place])   . PAF (paroxysmal atrial fibrillation) ([place])   . Benign essential HTN   . Dysphagia, post-stroke   . 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 Stroke (cerebrum) ([place]) 05/27/2017    Palliative Care Assessment & Plan   Patient Profile: 85 y.o. male  with past medical history of CVA with left hemiparesis, hypertension, diastolic heart failure, atrial fibrillation on Eliquis, and bladder cancer who was admitted on 05/12/2018 with pneumonia. Patient presented to ED for worsening productive cough with associated nausea, vomiting, and poor appetite.   Recommendations/Plan:  Continue current scope treatment.  His wife reports understanding concern about his long-term prognosis, however, she feels that he needs to be evaluated by the [place] prior to making any changes to long-term care plan.  Goals of Care and Additional Recommendations:  Limitations on Scope of Treatment: Full Scope Treatment  Code Status:    Code Status Orders  (From admission, onward)         Start     Ordered   05/13/18 0236  Full code  Continuous     05/13/18 0237        Code Status History    Date Active Date Inactive Code Status Order ID Comments User Context   1January 17, 202019 1723 04/04/2018 1523 Full Code [id number]  Hetty Ely,  Gae Bon, MD ED   11/06/2017 0215 11/12/2017 1924 Full Code [id number]  Rise Patience, MD Inpatient   09/23/2017 1948 09/26/2017 1707 Full Code [id number]   Tiffany Kocher, MD Inpatient   08/10/2017 1410 08/13/2017 2158 DNR [id number]  Everrett Coombe, MD ED   05/30/2017 1536 06/20/2017 1640 Full Code [id number]  Cathlyn Parsons, PA-C Inpatient   05/30/2017 1536 05/30/2017 1536 Full Code [id number]  Cathlyn Parsons, PA-C Inpatient   05/28/2017 0011 05/30/2017 1517 Full Code [id number]  Luanne Bras, MD Inpatient   05/27/2017 2135 05/28/2017 0011 Full Code [id number]  Kerney Elbe, MD ED       Prognosis:   Unable to determine  Discharge Planning:  To Be Determined  Care plan was discussed with patient, wife  Thank you for allowing the Palliative Medicine Team to assist in the care of this patient.   Total Time 30 Prolonged Time Billed No      Greater than 50%  of this time was spent counseling and coordinating care related to the above assessment and plan.  Micheline Rough, MD  Please contact Palliative Medicine Team phone at [phone] for questions and concerns.

## 2018-05-18 LAB — CULTURE, BLOOD (ROUTINE X 2): Culture: NO GROWTH

## 2018-05-18 MED ORDER — OXYCODONE HCL 5 MG/5ML PO SOLN
2.5000 mg | Freq: Four times a day (QID) | ORAL | Status: DC | PRN
  Administered 2018-05-18 – 2018-05-20 (×3): 2.5 mg via ORAL
  Filled 2018-05-18 (×4): qty 5

## 2018-05-18 NOTE — Progress Notes (Signed)
Daily Progress Note   Patient Name: Derek Hays       Date: 05/18/2018 DOB: 07-12-1930  Age: 85 y.o. MRN#: [id number] Attending Physician: Hosie Poisson, MD Primary Care Physician: Administration, Veterans Admit Date: 05/12/2018  Reason for Consultation/Follow-up: Establishing goals of care  Subjective: I saw and examined [name] today.  He is sitting in bed eating some dinner with assistance of his daughter.  His wife, daughter, and son-in-law are present in the room.  We discussed recurrent hospitalizations for aspiration pneumonia and concern that this will continue to be a problem moving forward.  Discussed the nature of aspiration pneumonia due to his dysphagia status post stroke and how it is a matter of when, not if, it recurs again.  His daughter inquired about a feeding tube and we discussed the risk versus benefit of this and concern that it would not eliminate his risk for aspiration.  We also discussed concern noted in December for potential recurrence of bladder cancer as well as new liver finding.  He has been ordered an MRI to evaluate this further.  His wife states that he has a pacemaker and provided card for me to place information on chart.  I also expressed concern that with his goal of being at home and spending time with family, escalation of care to CPR and mechanical ventilation in the event of cardiac or respiratory arrest is not likely to result in him getting well enough to be back at home to spend time with family in a meaningful way.  Family expressed being appreciative of information but are invested in plan to continue with current interventions at this time.    Length of Stay: 5  Current Medications: Scheduled Meds:  . apixaban  5 mg Oral BID  .  bethanechol  25 mg Oral 3 times per day  . Chlorhexidine Gluconate Cloth  6 each Topical Q0600  . cholecalciferol  2,000 Units Oral Daily  . folic acid  1 mg Oral Daily  . latanoprost  1 drop Both Eyes QHS  . levothyroxine  137 mcg Oral Q0600  . mupirocin ointment  1 application Nasal BID  . pregabalin  150 mg Oral BID  . tamsulosin  0.4 mg Oral QHS  . trimethoprim-polymyxin b  1 drop Left Eye Q4H    Continuous  Infusions: . ceFEPime (MAXIPIME) IV 1 g (05/18/18 0926)    PRN Meds: acetaminophen **OR** acetaminophen, albuterol, ondansetron **OR** ondansetron (ZOFRAN) IV, oxyCODONE, polyethylene glycol, polyvinyl alcohol  Physical Exam        General: elderly male resting in bed Lungs: CTAB Cardiac: regular rate and rhythm Extremities: mild edema in lower extremities, L>R  Vital Signs: BP (!) 156/86 (BP Location: Right Arm) Comment: nurse notified  Pulse 60   Temp 97.6 F (36.4 C) (Axillary)   Resp 20   Ht 6' (1.829 m)   Wt 94.1 kg   SpO2 98%   BMI 28.14 kg/m  SpO2: SpO2: 98 % O2 Device: O2 Device: Room Air O2 Flow Rate:    Intake/output summary:   Intake/Output Summary (Last 24 hours) at 05/18/2018 0951 Last data filed at 05/18/2018 0522 Gross per 24 hour  Intake 476 ml  Output 1050 ml  Net -574 ml   LBM: Last BM Date: 05/16/18 Baseline Weight: Weight: 87.1 kg Most recent weight: Weight: 94.1 kg       Palliative Assessment/Data:    Flowsheet Rows     Most Recent Value  Intake Tab  Referral Department  Hospitalist  Unit at Time of Referral  Med/Surg Unit  Palliative Care Primary Diagnosis  Cancer  Date Notified  05/15/18  Palliative Care Type  Return patient Palliative Care  Reason for referral  Clarify Goals of Care  Date of Admission  05/12/18  # of days IP prior to Palliative referral  3  Clinical Assessment  Psychosocial & Spiritual Assessment  Palliative Care Outcomes      Patient Active Problem List   Diagnosis Date Noted  . Goals of care,  counseling/discussion   . Palliative care encounter   . Pneumonia 05/12/2018  . Urinary retention 04/02/2018  . CAP (community acquired pneumonia) 12020/09/1517  . Permanent atrial fibrillation   . SIRS (systemic inflammatory response syndrome) ([place]) 11/05/2017  . Urinary tract infection 09/23/2017  . Acute respiratory failure with hypoxia ([place]) 09/23/2017  . Oropharyngeal dysphagia 09/23/2017  . Palliative care by specialist   . Sepsis due to urinary tract infection ([place])   . HCAP (healthcare-associated pneumonia)   . Altered mental status 08/10/2017  . Acute blood loss anemia   . Stage 3 chronic kidney disease ([place])   . PAF (paroxysmal atrial fibrillation) ([place])   . Benign essential HTN   . Dysphagia, post-stroke   . 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 Stroke (cerebrum) ([place]) 05/27/2017    Palliative Care Assessment & Plan   Patient Profile: 85 y.o. male  with past medical history of CVA with left hemiparesis, hypertension, diastolic heart failure, atrial fibrillation on Eliquis, and bladder cancer who was admitted on 05/12/2018 with pneumonia. Patient presented to ED for worsening productive cough with associated nausea, vomiting, and poor appetite.   Recommendations/Plan:  Continue current scope treatment.  Family expressed understanding concerns regarding long-term care plan in light of his recurrent aspiration secondary to dysphagia, concern for recurrence of bladder cancer, and finding of liver abnormality on recent CT scan.  They are invested in plan to see how much recovery he can make and want to follow-up with his doctors at the [place].  I provided copies of [employer] Choices for [employer] for his wife and daughter to review.  He reports today that he is having significant lower extremity pain.  He takes Lyrica at home and his wife reports that  he does well when he has low doses of other pain medication  for use as needed.  As he reports his pain is worse here in the hospital secondary to lying in the hospital bed, I placed order for low-dose oxycodone (PMP reveals this is the last medication he was prescribed) for use if needed if his pain becomes severe.    Goals of Care and Additional Recommendations:  Limitations on Scope of Treatment: Full Scope Treatment  Code Status:    Code Status Orders  (From admission, onward)         Start     Ordered   05/13/18 0236  Full code  Continuous     05/13/18 0237        Code Status History    Date Active Date Inactive Code Status Order ID Comments User Context   1September 19, 202019 1723 04/04/2018 1523 Full Code [id number]  Ledell Noss, MD ED   11/06/2017 0215 11/12/2017 1924 Full Code [id number]  Rise Patience, MD Inpatient   09/23/2017 1948 09/26/2017 1707 Full Code [id number]  Tiffany Kocher, MD Inpatient   08/10/2017 1410 08/13/2017 2158 DNR [id number]  Everrett Coombe, MD ED   05/30/2017 1536 06/20/2017 1640 Full Code [id number]  Cathlyn Parsons, PA-C Inpatient   05/30/2017 1536 05/30/2017 1536 Full Code [id number]  Cathlyn Parsons, PA-C Inpatient   05/28/2017 0011 05/30/2017 1517 Full Code [id number]  Luanne Bras, MD Inpatient   05/27/2017 2135 05/28/2017 0011 Full Code [id number]  Kerney Elbe, MD ED       Prognosis:   Unable to determine  Discharge Planning:  To Be Determined  Care plan was discussed with patient, wife, daughter, son in law  Thank you for allowing the Palliative Medicine Team to assist in the care of this patient.   Total Time 45 Prolonged Time Billed No      Greater than 50%  of this time was spent counseling and coordinating care related to the above assessment and plan.  Micheline Rough, MD  Please contact Palliative Medicine Team phone at [phone] for questions and concerns.

## 2018-05-18 NOTE — Progress Notes (Signed)
Daily Progress Note   Patient Name: Derek Hays       Date: 05/18/2018 DOB: 16-Nov-1930  Age: 85 y.o. MRN#: [id number] Attending Physician: Hosie Poisson, MD Primary Care Physician: Administration, Veterans Admit Date: 05/12/2018  Reason for Consultation/Follow-up: Establishing goals of care  Subjective: I saw and examined Derek Hays today.   No family present in room.  Reports that pain got worse again last evening.  He took small dose (2.5mg ) of oxycodone and it allowed him to rest through the night.  He relayed to me the emotional struggle he and his wife have been facing with his illness.  He reports that following his stroke his wife has been his support to deal with his illnesses, including his stroke and bladder cancer.  Provided supportive listening regarding the difficulty he has faced with continued loss of his ability to do things for himself.  Reports that he knows things have been getting worse, but he relies on her for strength.  Length of Stay: 5  Current Medications: Scheduled Meds:  . apixaban  5 mg Oral BID  . bethanechol  25 mg Oral 3 times per day  . Chlorhexidine Gluconate Cloth  6 each Topical Q0600  . cholecalciferol  2,000 Units Oral Daily  . folic acid  1 mg Oral Daily  . latanoprost  1 drop Both Eyes QHS  . levothyroxine  137 mcg Oral Q0600  . mupirocin ointment  1 application Nasal BID  . pregabalin  150 mg Oral BID  . tamsulosin  0.4 mg Oral QHS  . trimethoprim-polymyxin b  1 drop Left Eye Q4H    Continuous Infusions: . ceFEPime (MAXIPIME) IV 1 g (05/18/18 0926)    PRN Meds: acetaminophen **OR** acetaminophen, albuterol, ondansetron **OR** ondansetron (ZOFRAN) IV, oxyCODONE, polyethylene glycol, polyvinyl alcohol  Physical Exam        General:  elderly male resting in bed Lungs: CTAB Cardiac: regular rate and rhythm Extremities: mild edema in lower extremities, L>R  Vital Signs: BP (!) 156/86 (BP Location: Right Arm) Comment: nurse notified  Pulse 60   Temp 97.6 F (36.4 C) (Axillary)   Resp 20   Ht 6' (1.829 m)   Wt 94.1 kg   SpO2 98%   BMI 28.14 kg/m  SpO2: SpO2: 98 % O2 Device: O2 Device: Room [employer]  O2 Flow Rate:    Intake/output summary:   Intake/Output Summary (Last 24 hours) at 05/18/2018 1216 Last data filed at 05/18/2018 0522 Gross per 24 hour  Intake 476 ml  Output 750 ml  Net -274 ml   LBM: Last BM Date: 05/16/18 Baseline Weight: Weight: 87.1 kg Most recent weight: Weight: 94.1 kg       Palliative Assessment/Data:    Flowsheet Rows     Most Recent Value  Intake Tab  Referral Department  Hospitalist  Unit at Time of Referral  Med/Surg Unit  Palliative Care Primary Diagnosis  Cancer  Date Notified  05/15/18  Palliative Care Type  Return patient Palliative Care  Reason for referral  Clarify Goals of Care  Date of Admission  05/12/18  # of days IP prior to Palliative referral  3  Clinical Assessment  Psychosocial & Spiritual Assessment  Palliative Care Outcomes      Patient Active Problem List   Diagnosis Date Noted  . Goals of care, counseling/discussion   . Palliative care encounter   . Pneumonia 05/12/2018  . Urinary retention 04/02/2018  . CAP (community acquired pneumonia) 111/02/202019  . Permanent atrial fibrillation   . SIRS (systemic inflammatory response syndrome) ([place]) 11/05/2017  . Urinary tract infection 09/23/2017  . Acute respiratory failure with hypoxia ([place]) 09/23/2017  . Oropharyngeal dysphagia 09/23/2017  . Palliative care by specialist   . Sepsis due to urinary tract infection ([place])   . HCAP (healthcare-associated pneumonia)   . Altered mental status 08/10/2017  . Acute blood loss anemia   . Stage 3 chronic kidney disease ([place])   . PAF (paroxysmal atrial fibrillation)  ([place])   . Benign essential HTN   . Dysphagia, post-stroke   . 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 Stroke (cerebrum) ([place]) 05/27/2017    Palliative Care Assessment & Plan   Patient Profile: 85 y.o. male  with past medical history of CVA with left hemiparesis, hypertension, diastolic heart failure, atrial fibrillation on Eliquis, and bladder cancer who was admitted on 05/12/2018 with pneumonia. Patient presented to ED for worsening productive cough with associated nausea, vomiting, and poor appetite.   Recommendations/Plan:  Continue current scope treatment.  Family expressed understanding concerns regarding long-term care plan in light of his recurrent aspiration secondary to dysphagia, concern for recurrence of bladder cancer, and finding of liver abnormality on recent CT scan.  They are invested in plan to see how much recovery he can make and want to follow-up with his doctors at the [place].  I provided copies of Hard Choices for [employer]oving People for his wife and daughter to review.  He reports today lower extremity pain much improved following low-dose oxycodone and he was able to rest last night.  Continue oxycodone 2.5mg  Q6hours as needed.  Goals of Care and Additional Recommendations:  Limitations on Scope of Treatment: Full Scope Treatment  Code Status:    Code Status Orders  (From admission, onward)         Start     Ordered   05/13/18 0236  Full code  Continuous     05/13/18 0237        Code Status History    Date Active Date Inactive Code Status Order ID Comments User Context   111/02/202019 1723 04/04/2018 1523 Full Code [id number]  Ledell Noss, MD ED   11/06/2017 0215 11/12/2017 1924 Full Code [id number]  Rise Patience, MD Inpatient  09/23/2017 1948 09/26/2017 1707 Full Code [id number]  Tiffany Kocher, MD Inpatient   08/10/2017 1410 08/13/2017 2158 DNR [id number]  Everrett Coombe, MD ED   05/30/2017  1536 06/20/2017 1640 Full Code [id number]  Cathlyn Parsons, PA-C Inpatient   05/30/2017 1536 05/30/2017 1536 Full Code [id number]  Cathlyn Parsons, PA-C Inpatient   05/28/2017 0011 05/30/2017 1517 Full Code [id number]  Luanne Bras, MD Inpatient   05/27/2017 2135 05/28/2017 0011 Full Code [id number]  Kerney Elbe, MD ED       Prognosis:   Unable to determine  Discharge Planning:  To Be Determined  Care plan was discussed with patient, wife, daughter, son in law  Thank you for allowing the Palliative Medicine Team to assist in the care of this patient.   Total Time 30 Prolonged Time Billed No      Greater than 50%  of this time was spent counseling and coordinating care related to the above assessment and plan.  Micheline Rough, MD  Please contact Palliative Medicine Team phone at [phone] for questions and concerns.

## 2018-05-18 NOTE — Progress Notes (Signed)
PROGRESS NOTE    Derek Hays  GYJ:[id number] DOB: Jan 16, 1931 DOA: 05/12/2018 [facility]CP[facility]    Brief Narrative: Derek Hays a 85 y.o.malewithhistory of CVA with left-sided hemiparesis essentially bedbound, hypertension, atrial fibrillation, diastolic CHF, postoperative hypothyroidism was referred to the ER after patient's home health care nurse found that patient has been having productive cough for last 3 days discolored sputum with weakness and also had some episodes of nausea vomiting last 2 days and poor appetite he was admitted for pneumonia   Assessment & Plan:   Principal Problem:   Pneumonia Active Problems:   Stage 3 chronic kidney disease (HCC)   PAF (paroxysmal atrial fibrillation) (HCC)   Benign essential HTN   Goals of care, counseling/discussion   Palliative care encounter   Right middle lobe pneumonia? Aspiration pneumonia.  CT chest notable for right pleural effusion he was initially started on broad-spectrum antibiotics with IV vancomycin and Zosyn, later on transitioned to cefepime.  Patient breathing has improved but he reports he has reflux when he lays down.     Acute metabolic encephalopathy probably secondary to pneumonia He is improving he is alert and oriented to place and person. Pt appears to be back to baseline.    Moderate oropharyngeal dysphagia Speech pathology on board and palliative care consulted. Discussions ongoing.    Left eye conjunctivitis continue with otic antibiotics with Polytrim.   Paroxysmal atrial fibrillation Rate controlled continue with apixaban for anticoagulation.    History of chronic diastolic heart failure He appears to be compensated.   Stage III CKD Creatinine appears to be at baseline.   History of CVA with residual left-sided hemiparesis continue with apixaban and statin  Ill-defined lesion in the liver on CT Incidental finding further work-up with MRI of the liver  ordered  History of chronic left hydro-ureteronephrosis with remote bladder cancer history Patient follows up with [facility] at [facility] no new recommendations at this time.  DVT prophylaxis: Apixaban Code Status: (Full code Family Communication: None at bedside today Disposition Plan: Pending clinical improvement and palliative discussion with the family.   Consultants:   Palliative care consult  Procedures: MRI of the liver  Antimicrobials: Cefepime since admission   Subjective: No new complaints this time  Objective: Vitals:   05/17/18 1606 05/17/18 2015 05/18/18 0520 05/18/18 1616  BP: (!) 157/93 110/82 (!) 156/86 (!) 143/85  Pulse: (!) 58 60 60 60  Resp: 20 19 20 20   Temp: 97.8 F (36.6 C) 97.8 F (36.6 C) 97.6 F (36.4 C) 98.6 F (37 C)  TempSrc: Oral Oral Axillary Oral  SpO2: 100% 97% 98% 96%  Weight:   94.1 kg   Height:        Intake/Output Summary (Last 24 hours) at 05/18/2018 1628 Last data filed at 05/18/2018 0522 Gross per 24 hour  Intake -  Output 400 ml  Net -400 ml   Filed Weights   05/16/18 0500 05/17/18 0500 05/18/18 0520  Weight: 92.9 kg 93.6 kg 94.1 kg    Examination:  General exam: Appears calm and comfortable , not in distress  Respiratory system: Air entry fair scattered rhonchi at bases Cardiovascular system: S1 & S2 heard, RRR. No JVD, Gastrointestinal system: Abdomen is nondistended, soft and nontender.  Bowel sounds are good Central nervous system: Alert and oriented. Non focal.  Extremities: Symmetric 5 x 5 power. Skin: No rashes, lesions or ulcers Psychiatry:Mood & affect appropriate.     Data Reviewed: I have personally reviewed following labs and  imaging studies  CBC: Recent Labs  Lab 05/12/18 2015 05/13/18 0359 05/15/18 0622 05/16/18 0626 05/17/18 0605  WBC 8.5 8.4 7.0 8.4 7.8  NEUTROABS 6.3  --   --   --   --   HGB 14.4 13.7 12.6* 12.6* 12.8*  HCT 44.7 42.5 39.5 39.3 40.8  MCV 91.4 90.4 91.6 92.0 90.3  PLT 179 173  145* 149* 629   Basic Metabolic Panel: Recent Labs  Lab 05/12/18 2100 05/13/18 0359 05/15/18 0622 05/16/18 0626 05/17/18 0605  NA 140 140 143 145 144  K 4.5 4.0 3.6 3.5 3.5  CL 106 106 110 113* 111  CO2 24 24 26 25 24   GLUCOSE 98 100* 99 113* 98  BUN 29* 29* 28* 23 20  CREATININE 1.45* 1.35* 1.37* 1.39* 1.30*  CALCIUM 9.0 9.1 9.0 8.9 9.0   GFR: Estimated Creatinine Clearance: 49.5 mL/min (A) (by C-G formula based on SCr of 1.3 mg/dL (H)). Liver Function Tests: Recent Labs  Lab 05/12/18 2100  AST 35  ALT 19  ALKPHOS 284*  BILITOT 1.0  PROT 6.7  ALBUMIN 3.2*   No results for input(s): LIPASE, AMYLASE in the last 168 hours. No results for input(s): AMMONIA in the last 168 hours. Coagulation Profile: No results for input(s): INR, PROTIME in the last 168 hours. Cardiac Enzymes: Recent Labs  Lab 05/12/18 2100  TROPONINI <0.03   BNP (last 3 results) No results for input(s): PROBNP in the last 8760 hours. HbA1C: No results for input(s): HGBA1C in the last 72 hours. CBG: Recent Labs  Lab 05/12/18 2043  GLUCAP 77   Lipid Profile: No results for input(s): CHOL, HDL, LDLCALC, TRIG, CHOLHDL, LDLDIRECT in the last 72 hours. Thyroid Function Tests: No results for input(s): TSH, T4TOTAL, FREET4, T3FREE, THYROIDAB in the last 72 hours. Anemia Panel: No results for input(s): VITAMINB12, FOLATE, FERRITIN, TIBC, IRON, RETICCTPCT in the last 72 hours. Sepsis Labs: Recent Labs  Lab 05/12/18 2015  LATICACIDVEN 1.6    Recent Results (from the past 240 hour(s))  Urine culture     Status: Abnormal   Collection Time: 05/12/18  9:28 PM  Result Value Ref Range Status   Specimen Description   Final    URINE, CLEAN CATCH Performed at [facility], [place] [address] [place], [place] [address]    Special Requests   Final    NONE Performed at [facility], [place] [address] [place], [place] [address]    Culture MULTIPLE SPECIES PRESENT,  SUGGEST RECOLLECTION (A)  Final   Report Status 05/14/2018 FINAL  Final  Culture, blood (routine x 2)     Status: Abnormal   Collection Time: 05/12/18  9:28 PM  Result Value Ref Range Status   Specimen Description   Final    BLOOD BLOOD RIGHT FOREARM Performed at [place] [address], [place], [place] [address]    Special Requests   Final    BOTTLES DRAWN AEROBIC AND ANAEROBIC Blood Culture adequate volume Performed at [place] [address], [place], [place] [address]    Culture  Setup Time   Final    IN BOTH AEROBIC AND ANAEROBIC BOTTLES GRAM POSITIVE COCCI CRITICAL RESULT CALLED TO, READ BACK BY AND VERIFIED WITHLavell Luster [facility] 4034 05/14/18 A BROWNING    Culture (A)  Final    STAPHYLOCOCCUS SPECIES (COAGULASE NEGATIVE) THE SIGNIFICANCE OF ISOLATING THIS ORGANISM FROM A SINGLE SET OF BLOOD CULTURES WHEN MULTIPLE SETS ARE DRAWN  IS UNCERTAIN. PLEASE NOTIFY THE MICROBIOLOGY DEPARTMENT WITHIN ONE WEEK IF SPECIATION AND SENSITIVITIES ARE REQUIRED. Performed at [facility], [place] [address] [place], [place] [address]    Report Status 05/15/2018 FINAL  Final  Blood Culture ID Panel (Reflexed)     Status: Abnormal   Collection Time: 05/12/18  9:28 PM  Result Value Ref Range Status   Enterococcus species NOT DETECTED NOT DETECTED Final   Listeria monocytogenes NOT DETECTED NOT DETECTED Final   Staphylococcus species DETECTED (A) NOT DETECTED Final    Comment: Methicillin (oxacillin) resistant coagulase negative staphylococcus. Possible blood culture contaminant (unless isolated from more than one blood culture draw or clinical case suggests pathogenicity). No antibiotic treatment is indicated for blood  culture contaminants. CRITICAL RESULT CALLED TO, READ BACK BY AND VERIFIED WITHLavell Luster [facility] 8270 05/14/18 A BROWNING    Staphylococcus aureus (BCID) NOT DETECTED NOT DETECTED Final   Methicillin resistance DETECTED (A) NOT  DETECTED Final    Comment: CRITICAL RESULT CALLED TO, READ BACK BY AND VERIFIED WITH: Lavell Luster [facility] 7867 05/14/18 A BROWNING    Streptococcus species NOT DETECTED NOT DETECTED Final   Streptococcus agalactiae NOT DETECTED NOT DETECTED Final   Streptococcus pneumoniae NOT DETECTED NOT DETECTED Final   Streptococcus pyogenes NOT DETECTED NOT DETECTED Final   Acinetobacter baumannii NOT DETECTED NOT DETECTED Final   Enterobacteriaceae species NOT DETECTED NOT DETECTED Final   Enterobacter cloacae complex NOT DETECTED NOT DETECTED Final   Escherichia coli NOT DETECTED NOT DETECTED Final   Klebsiella oxytoca NOT DETECTED NOT DETECTED Final   Klebsiella pneumoniae NOT DETECTED NOT DETECTED Final   Proteus species NOT DETECTED NOT DETECTED Final   Serratia marcescens NOT DETECTED NOT DETECTED Final   Haemophilus influenzae NOT DETECTED NOT DETECTED Final   Neisseria meningitidis NOT DETECTED NOT DETECTED Final   Pseudomonas aeruginosa NOT DETECTED NOT DETECTED Final   Candida albicans NOT DETECTED NOT DETECTED Final   Candida glabrata NOT DETECTED NOT DETECTED Final   Candida krusei NOT DETECTED NOT DETECTED Final   Candida parapsilosis NOT DETECTED NOT DETECTED Final   Candida tropicalis NOT DETECTED NOT DETECTED Final    Comment: Performed at [facility], [place]. [address], [place], [place] [address]  Culture, blood (routine x 2)     Status: None   Collection Time: 05/12/18  9:29 PM  Result Value Ref Range Status   Specimen Description BLOOD RIGHT ANTECUBITAL  Final   Special Requests   Final    AEROBIC BOTTLE ONLY Blood Culture results may not be optimal due to an inadequate volume of blood received in culture bottles Performed at [facility], [place] [address] [place], [place] [address]    Culture NO GROWTH 5 DAYS  Final   Report Status 05/18/2018 FINAL  Final  MRSA PCR Screening     Status: Abnormal   Collection Time: 05/13/18  3:59 AM  Result Value Ref  Range Status   MRSA by PCR POSITIVE (A) NEGATIVE Final    Comment:        The GeneXpert MRSA Assay (FDA approved for NASAL specimens only), is one component of a comprehensive MRSA colonization surveillance program. It is not intended to diagnose MRSA infection nor to guide or monitor treatment for MRSA infections. CRITICAL RESULT CALLED TO, READ BACK BY AND VERIFIED WITH: RN [facility] AT 1219 05/13/18 Seibert A. Performed at [facility], [place] [address], [place], [place] [address]  Radiology Studies: No results found.      Scheduled Meds: . apixaban  5 mg Oral BID  . bethanechol  25 mg Oral 3 times per day  . Chlorhexidine Gluconate Cloth  6 each Topical Q0600  . cholecalciferol  2,000 Units Oral Daily  . folic acid  1 mg Oral Daily  . latanoprost  1 drop Both Eyes QHS  . levothyroxine  137 mcg Oral Q0600  . mupirocin ointment  1 application Nasal BID  . pregabalin  150 mg Oral BID  . tamsulosin  0.4 mg Oral QHS  . trimethoprim-polymyxin b  1 drop Left Eye Q4H   Continuous Infusions: . ceFEPime (MAXIPIME) IV 1 g (05/18/18 0926)     LOS: 5 days    Time spent:26 minutes    Hosie Poisson, MD [facility] Pager [phone]  If 7PM-7AM, please contact night-coverage [email or web address] Password [facility] 05/18/2018, 4:28 PM

## 2018-05-19 ENCOUNTER — Inpatient Hospital Stay (HOSPITAL_COMMUNITY): Payer: Medicare Other

## 2018-05-19 MED ORDER — LORAZEPAM 2 MG/ML IJ SOLN: 1.0000 mg | Freq: Once | INTRAMUSCULAR | Status: DC

## 2018-05-19 NOTE — Progress Notes (Signed)
Patient set up for MRI at [facility] tomorrow.  To arrive at 130 pm.  [facility] aware to transport patient.  Patient to be npo after 9 am.

## 2018-05-19 NOTE — Care Management Note (Addendum)
Case Management Note  Patient Details  Name: Derek Hays MRN: [id number] Date of Birth: 10/02/1930  Subjective/Objective:                   tcf-April with the [facility] for hospice care and hospice house  Action/Plan: Contact is [place], [place] [phone] or [phone]  Expected Discharge Date:  (unknown)               Expected Discharge Plan:     In-House Referral:     Discharge planning Services     Post Acute Care Choice:    Choice offered to:     DME Arranged:    DME Agency:     HH Arranged:    [place] Agency:     Status of Service:     If discussed at [place] of Stay Meetings, dates discussed:    Additional Comments:  Leeroy Cha, RN 05/19/2018, 1:11 PM

## 2018-05-19 NOTE — Progress Notes (Signed)
Daily Progress Note   Patient Name: Derek Hays       Date: 05/19/2018 DOB: December 13, 1930  Age: 85 y.o. MRN#: [id number] Attending Physician: Hosie Poisson, MD Primary Care Physician: [facility] Admit Date: 05/12/2018  Reason for Consultation/Follow-up: Establishing goals of care  Subjective: I saw and examined Derek Hays today.   Wife present in room.  We discussed clinical course including recurrent hospitalizations for aspiration pneumonia and concern that this will continue to be a problem moving forward regardless of other interventions as he will continue to aspirate secretions.   Discussed liver lesion and plan for MRI.  His wife reports that they are planning to transition home on discharge with home health.  She is concerned about him being discharged tomorrow as he has appointment at the [facility] for a grant he applied for and has been waiting months for appointment.  Length of Stay: 6  Current Medications: Scheduled Meds:  . apixaban  5 mg Oral BID  . bethanechol  25 mg Oral 3 times per day  . cholecalciferol  2,000 Units Oral Daily  . folic acid  1 mg Oral Daily  . latanoprost  1 drop Both Eyes QHS  . levothyroxine  137 mcg Oral Q0600  . pregabalin  150 mg Oral BID  . tamsulosin  0.4 mg Oral QHS  . trimethoprim-polymyxin b  1 drop Left Eye Q4H    Continuous Infusions: . ceFEPime (MAXIPIME) IV 1 g (05/19/18 2130)    PRN Meds: acetaminophen **OR** acetaminophen, albuterol, ondansetron **OR** ondansetron (ZOFRAN) IV, oxyCODONE, polyethylene glycol, polyvinyl alcohol  Physical Exam        General: elderly male resting in bed Lungs: CTAB Cardiac: regular rate and rhythm Extremities: mild edema in lower extremities, L>R  Vital Signs: BP (!) 148/71 (BP  Location: Right Arm)   Pulse 60   Temp 98.6 F (37 C) (Oral)   Resp 14   Ht 6' (1.829 m)   Wt 94.2 kg   SpO2 98%   BMI 28.17 kg/m  SpO2: SpO2: 98 % O2 Device: O2 Device: Room Air O2 Flow Rate:    Intake/output summary:   Intake/Output Summary (Last 24 hours) at 05/19/2018 2214 Last data filed at 05/19/2018 0529 Gross per 24 hour  Intake -  Output 600 ml  Net -600 ml  LBM: Last BM Date: 05/16/18 Baseline Weight: Weight: 87.1 kg Most recent weight: Weight: 94.2 kg       Palliative Assessment/Data:    Flowsheet Rows     Most Recent Value  Intake Tab  Referral Department  Hospitalist  Unit at Time of Referral  Med/Surg Unit  Palliative Care Primary Diagnosis  Cancer  Date Notified  05/15/18  Palliative Care Type  Return patient Palliative Care  Reason for referral  Clarify Goals of Care  Date of Admission  05/12/18  # of days IP prior to Palliative referral  3  Clinical Assessment  Psychosocial & Spiritual Assessment  Palliative Care Outcomes      Patient Active Problem List   Diagnosis Date Noted  . Goals of care, counseling/discussion   . Palliative care encounter   . Pneumonia 05/12/2018  . Urinary retention 04/02/2018  . CAP (community acquired pneumonia) 109/18/202019  . Permanent atrial fibrillation   . SIRS (systemic inflammatory response syndrome) ([place]) 11/05/2017  . Urinary tract infection 09/23/2017  . Acute respiratory failure with hypoxia ([place]) 09/23/2017  . Oropharyngeal dysphagia 09/23/2017  . Palliative care by specialist   . Sepsis due to urinary tract infection ([place])   . HCAP (healthcare-associated pneumonia)   . Altered mental status 08/10/2017  . Acute blood loss anemia   . Stage 3 chronic kidney disease ([place])   . PAF (paroxysmal atrial fibrillation) ([place])   . Benign essential HTN   . Dysphagia, post-stroke   . 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 Stroke (cerebrum)  ([place]) 05/27/2017    Palliative Care Assessment & Plan   Patient Profile: 85 y.o. male  with past medical history of CVA with left hemiparesis, hypertension, diastolic heart failure, atrial fibrillation on Eliquis, and bladder cancer who was admitted on 05/12/2018 with pneumonia. Patient presented to ED for worsening productive cough with associated nausea, vomiting, and poor appetite.   Recommendations/Plan:  Continue current scope treatment.  He reports today lower extremity pain much improved following low-dose oxycodone and he was able to rest last night.  Would continue oxycodone 2.5mg  Q6hours as needed.  Family expressed understanding concerns regarding long-term care plan in light of his recurrent aspiration secondary to dysphagia, concern for recurrence of bladder cancer, and finding of liver abnormality on recent CT scan.  They are invested in plan to see how much recovery he can make and want to follow-up with his doctors at the [place]  He would like to discharge tomorrow to home in order to follow-up at the [facility] for another appointment.  They would like to be followed by palliative care as OP.  Goals of Care and Additional Recommendations:  Limitations on Scope of Treatment: Full Scope Treatment  Code Status:    Code Status Orders  (From admission, onward)         Start     Ordered   05/13/18 0236  Full code  Continuous     05/13/18 0237        Code Status History    Date Active Date Inactive Code Status Order ID Comments User Context   109/18/202019 1723 04/04/2018 1523 Full Code [id number]  Ledell Noss, MD ED   11/06/2017 0215 11/12/2017 1924 Full Code [id number]  Rise Patience, MD Inpatient   09/23/2017 1948 09/26/2017 1707 Full Code [id number]  Tiffany Kocher, MD Inpatient   08/10/2017 1410 08/13/2017 2158 DNR [id number]  Everrett Coombe, MD ED  05/30/2017 1536 06/20/2017 1640 Full Code [id number]  Elizabeth Sauer Inpatient   05/30/2017 1536 05/30/2017 1536 Full Code  [id number]  Elizabeth Sauer Inpatient   05/28/2017 0011 05/30/2017 1517 Full Code [id number]  Luanne Bras, MD Inpatient   05/27/2017 2135 05/28/2017 0011 Full Code [id number]  Kerney Elbe, MD ED       Prognosis:   Unable to determine  Discharge Planning:  To Be Determined  Care plan was discussed with Dr. Karleen Hampshire  Thank you for allowing the Palliative Medicine Team to assist in the care of this patient.   Total Time 50 Prolonged Time Billed No      Greater than 50%  of this time was spent counseling and coordinating care related to the above assessment and plan.  Micheline Rough, MD  Please contact Palliative Medicine Team phone at [phone] for questions and concerns.

## 2018-05-19 NOTE — Progress Notes (Signed)
Patient has Pacemaker and cannot be scanned at [place]. Patient needs to be scanned at [facility] MRI. Spoke with Raquel Sarna RN and gave number to [facility] MRI to have them give a  Time for patient to be scanned. Spoke to Richie MRI Tech at [place] 6:13am

## 2018-05-19 NOTE — Care Management Important Message (Signed)
Important Message  Patient Details  Name: Derek Hays MRN: [id number] Date of Birth: 02-Jun-1930   Medicare Important Message Given:  Yes    Kerin Salen 05/19/2018, 11:09 AMImportant Message  Patient Details  Name: Derek Hays MRN: [id number] Date of Birth: 04/16/1931   Medicare Important Message Given:  Yes    Kerin Salen 05/19/2018, 11:09 AM

## 2018-05-19 NOTE — Progress Notes (Signed)
  Speech Language Pathology Treatment: Dysphagia  Patient Details Name: Derek Hays MRN: [id number] DOB: 1931-02-12 Today's Date: 05/19/2018 Time: 1450-1530 SLP Time Calculation (min) (ACUTE ONLY): 40 min  Assessment / Plan / Recommendation Clinical Impression  Patient seen to assess tolerance of po intake and to educate wife and pt to recommendations/results of MBS.  Note intake has been poor and pt is on fluid restriction.   Reviewed MBS with wife with pt permission reviewing video loops of study. Educated to concern for chronic aspiration given h/o CVA, excessive secretions, dysphagia and recurrent pneumonias.  Pt does have pharyngeal residuals that he does not consistently sense - and silently trace aspirated thin and nectar on MBS.  Wife reports pt was drooling and orally pocketing foods worse on the left side prior to this admit. Using teach back, reviewed concerns for chronicity of aspiration however uncertain to level of comprehension.    Noted pt has had a uvulopalatopharyngoplasty "years ago" to help with snoring and wife denies this has been effective.  Wife and pt complain of pt feeling like he is "talking in a bucket" = he admitted to sensing "nasal speech".  SLP assessed and noted pt to has nasal loss of air on plosive sounds (P, B) likely due to palatopharyngeal weakness from prior CVA which likely exacerbated decreased palatal closure from prior UPPP.  Pt is intelligible however and denies loss of liquids nasally.    Note palliative following up with pt/wife = SLP will follow up for further dysphagia management. Using teach back, education re: importance of oral care at least BID reviewed.    HPI HPI: pt is 85 yo male adm to [facility] with respiratory difficulties.  PMH + for CVA, recurrent pnas with recent hospital admit, Afib and h/o bladder cancer.  MBS ordered as pt has h/o dysphagia.        SLP Plan  Continue with current plan of care       Recommendations  Diet  recommendations: Dysphagia 1 (puree);Thin liquid Liquids provided via: Straw;Cup Medication Administration: Whole meds with puree Supervision: Patient able to self feed Compensations: Slow rate;Small sips/bites;Lingual sweep for clearance of pocketing;Minimize environmental distractions(oral suction before and after meals) Postural Changes and/or Swallow Maneuvers: Seated upright 90 degrees;Upright 30-60 min after meal                Oral Care Recommendations: Oral care BID Follow up Recommendations: None SLP Visit Diagnosis: Dysphagia, oropharyngeal phase (R13.12) Plan: Continue with current plan of care       GO                Derek Hays 05/19/2018, 5:46 PM  Luanna Salk, [place] [facility] SLP [place] Pager [phone] Office [phone]

## 2018-05-19 NOTE — Progress Notes (Signed)
PROGRESS NOTE    Derek Hays  BMW:[id number] DOB: 1930-07-23 DOA: 05/12/2018 [facility]    Brief Narrative: Derek Musson Harrellis a 85 y.o.malewithhistory of CVA with left-sided hemiparesis essentially bedbound, hypertension, atrial fibrillation, diastolic CHF, postoperative hypothyroidism was referred to the ER after patient's home health care nurse found that patient has been having productive cough for last 3 days discolored sputum with weakness and also had some episodes of nausea vomiting last 2 days and poor appetite he was admitted for pneumonia.   Assessment & Plan:   Principal Problem:   Pneumonia Active Problems:   Stage 3 chronic kidney disease (HCC)   PAF (paroxysmal atrial fibrillation) (HCC)   Benign essential HTN   Goals of care, counseling/discussion   Palliative care encounter   Right middle lobe pneumonia? Aspiration pneumonia.  CT chest notable for right pleural effusion he was initially started on broad-spectrum antibiotics with IV vancomycin and Zosyn, later on transitioned to cefepime.  Patient breathing has improved but he reports he has reflux when he lays down.he is on RA with good sats. He is afebrile without any leukocytosis.     Acute metabolic encephalopathy probably secondary to pneumonia He is improving he is alert and oriented to place and person. Pt appears to be back to baseline.    Moderate oropharyngeal dysphagia Speech pathology on board and palliative care consulted. Discussions ongoing.    Left eye conjunctivitis continue with otic antibiotics with Polytrim.   Paroxysmal atrial fibrillation Rate controlled continue with apixaban for anticoagulation.    History of chronic diastolic heart failure He appears to be compensated.   Stage III CKD Creatinine appears to be at baseline.no changes.    History of CVA with residual left-sided hemiparesis continue with apixaban and statin  Ill-defined lesion in  the liver on CT Incidental finding further work-up with MRI of the liver ordered. Its still pending.   History of chronic left hydro-ureteronephrosis with remote bladder cancer history Patient follows up with urology at [facility] no new recommendations at this time.  DVT prophylaxis: Apixaban Code Status: (Full code Family Communication: None at bedside today Disposition Plan: Pending clinical improvement and palliative discussion with the family.   Consultants:   Palliative care consult  Procedures: MRI of the liver  Antimicrobials: Cefepime since admission   Subjective: Reports feeling the same. No new complaints.   Objective: Vitals:   05/18/18 0520 05/18/18 1616 05/18/18 2002 05/19/18 0526  BP: (!) 156/86 (!) 143/85 (!) 153/80 (!) 150/89  Pulse: 60 60 60 65  Resp: 20 20 19 18   Temp: 97.6 F (36.4 C) 98.6 F (37 C) 98.1 F (36.7 C) 98 F (36.7 C)  TempSrc: Axillary Oral Axillary Axillary  SpO2: 98% 96% 97% 99%  Weight: 94.1 kg   94.2 kg  Height:        Intake/Output Summary (Last 24 hours) at 05/19/2018 1127 Last data filed at 05/19/2018 0529 Gross per 24 hour  Intake 120 ml  Output 900 ml  Net -780 ml   Filed Weights   05/17/18 0500 05/18/18 0520 05/19/18 0526  Weight: 93.6 kg 94.1 kg 94.2 kg    Examination:  General exam: calm and comfortable, not in distress.  Respiratory system: air entry fair bilateral , no wheezing or rhonchi.  Cardiovascular system: S1 & S2 heard, RRR. No JVD, Gastrointestinal system: Abdomen is soft NT ND BS+ Central nervous system: Alert and oriented. Left hemiparesis.  Extremities: Symmetric 5 x 5 power.LEFT UPPER EXTREMITY swollen.  Skin: No rashes, lesions or ulcers Psychiatry:Mood & affect appropriate.     Data Reviewed: I have personally reviewed following labs and imaging studies  CBC: Recent Labs  Lab 05/12/18 2015 05/13/18 0359 05/15/18 0622 05/16/18 0626 05/17/18 0605  WBC 8.5 8.4 7.0 8.4 7.8  NEUTROABS 6.3  --    --   --   --   HGB 14.4 13.7 12.6* 12.6* 12.8*  HCT 44.7 42.5 39.5 39.3 40.8  MCV 91.4 90.4 91.6 92.0 90.3  PLT 179 173 145* 149* 818   Basic Metabolic Panel: Recent Labs  Lab 05/12/18 2100 05/13/18 0359 05/15/18 0622 05/16/18 0626 05/17/18 0605  NA 140 140 143 145 144  K 4.5 4.0 3.6 3.5 3.5  CL 106 106 110 113* 111  CO2 24 24 26 25 24   GLUCOSE 98 100* 99 113* 98  BUN 29* 29* 28* 23 20  CREATININE 1.45* 1.35* 1.37* 1.39* 1.30*  CALCIUM 9.0 9.1 9.0 8.9 9.0   GFR: Estimated Creatinine Clearance: 49.5 mL/min (A) (by C-G formula based on SCr of 1.3 mg/dL (H)). Liver Function Tests: Recent Labs  Lab 05/12/18 2100  AST 35  ALT 19  ALKPHOS 284*  BILITOT 1.0  PROT 6.7  ALBUMIN 3.2*   No results for input(s): LIPASE, AMYLASE in the last 168 hours. No results for input(s): AMMONIA in the last 168 hours. Coagulation Profile: No results for input(s): INR, PROTIME in the last 168 hours. Cardiac Enzymes: Recent Labs  Lab 05/12/18 2100  TROPONINI <0.03   BNP (last 3 results) No results for input(s): PROBNP in the last 8760 hours. HbA1C: No results for input(s): HGBA1C in the last 72 hours. CBG: Recent Labs  Lab 05/12/18 2043  GLUCAP 77   Lipid Profile: No results for input(s): CHOL, HDL, LDLCALC, TRIG, CHOLHDL, LDLDIRECT in the last 72 hours. Thyroid Function Tests: No results for input(s): TSH, T4TOTAL, FREET4, T3FREE, THYROIDAB in the last 72 hours. Anemia Panel: No results for input(s): VITAMINB12, FOLATE, FERRITIN, TIBC, IRON, RETICCTPCT in the last 72 hours. Sepsis Labs: Recent Labs  Lab 05/12/18 2015  LATICACIDVEN 1.6    Recent Results (from the past 240 hour(s))  Urine culture     Status: Abnormal   Collection Time: 05/12/18  9:28 PM  Result Value Ref Range Status   Specimen Description   Final    URINE, CLEAN CATCH Performed at [facility], [place] [address], [place], [place] [address]    Special Requests   Final     NONE Performed at [facility], [place] [address], [place], [place] [address]    Culture MULTIPLE SPECIES PRESENT, SUGGEST RECOLLECTION (A)  Final   Report Status 05/14/2018 FINAL  Final  Culture, blood (routine x 2)     Status: Abnormal   Collection Time: 05/12/18  9:28 PM  Result Value Ref Range Status   Specimen Description   Final    BLOOD BLOOD RIGHT FOREARM Performed at [place] [address], [place], [place] [address]    Special Requests   Final    BOTTLES DRAWN AEROBIC AND ANAEROBIC Blood Culture adequate volume Performed at [place] [address], [place], [place] [address]    Culture  Setup Time   Final    IN BOTH AEROBIC AND ANAEROBIC BOTTLES GRAM POSITIVE COCCI CRITICAL RESULT CALLED TO, READ BACK BY AND VERIFIED WITHLavell Luster [facility] 7412 05/14/18 A BROWNING    Culture (A)  Final  STAPHYLOCOCCUS SPECIES (COAGULASE NEGATIVE) THE SIGNIFICANCE OF ISOLATING THIS ORGANISM FROM A SINGLE SET OF BLOOD CULTURES WHEN MULTIPLE SETS ARE DRAWN IS UNCERTAIN. PLEASE NOTIFY THE MICROBIOLOGY DEPARTMENT WITHIN ONE WEEK IF SPECIATION AND SENSITIVITIES ARE REQUIRED. Performed at [facility], [place] [address] [place], [place] [address]    Report Status 05/15/2018 FINAL  Final  Blood Culture ID Panel (Reflexed)     Status: Abnormal   Collection Time: 05/12/18  9:28 PM  Result Value Ref Range Status   Enterococcus species NOT DETECTED NOT DETECTED Final   Listeria monocytogenes NOT DETECTED NOT DETECTED Final   Staphylococcus species DETECTED (A) NOT DETECTED Final    Comment: Methicillin (oxacillin) resistant coagulase negative staphylococcus. Possible blood culture contaminant (unless isolated from more than one blood culture draw or clinical case suggests pathogenicity). No antibiotic treatment is indicated for blood  culture contaminants. CRITICAL RESULT CALLED TO, READ BACK BY AND VERIFIED WITHLavell Luster  [facility] 9892 05/14/18 A BROWNING    Staphylococcus aureus (BCID) NOT DETECTED NOT DETECTED Final   Methicillin resistance DETECTED (A) NOT DETECTED Final    Comment: CRITICAL RESULT CALLED TO, READ BACK BY AND VERIFIED WITH: Lavell Luster PHARMD 1194 05/14/18 A BROWNING    Streptococcus species NOT DETECTED NOT DETECTED Final   Streptococcus agalactiae NOT DETECTED NOT DETECTED Final   Streptococcus pneumoniae NOT DETECTED NOT DETECTED Final   Streptococcus pyogenes NOT DETECTED NOT DETECTED Final   Acinetobacter baumannii NOT DETECTED NOT DETECTED Final   Enterobacteriaceae species NOT DETECTED NOT DETECTED Final   Enterobacter cloacae complex NOT DETECTED NOT DETECTED Final   Escherichia coli NOT DETECTED NOT DETECTED Final   Klebsiella oxytoca NOT DETECTED NOT DETECTED Final   Klebsiella pneumoniae NOT DETECTED NOT DETECTED Final   Proteus species NOT DETECTED NOT DETECTED Final   Serratia marcescens NOT DETECTED NOT DETECTED Final   Haemophilus influenzae NOT DETECTED NOT DETECTED Final   Neisseria meningitidis NOT DETECTED NOT DETECTED Final   Pseudomonas aeruginosa NOT DETECTED NOT DETECTED Final   Candida albicans NOT DETECTED NOT DETECTED Final   Candida glabrata NOT DETECTED NOT DETECTED Final   Candida krusei NOT DETECTED NOT DETECTED Final   Candida parapsilosis NOT DETECTED NOT DETECTED Final   Candida tropicalis NOT DETECTED NOT DETECTED Final    Comment: Performed at [facility], [place]. [address] [place], [place] [address]  Culture, blood (routine x 2)     Status: None   Collection Time: 05/12/18  9:29 PM  Result Value Ref Range Status   Specimen Description BLOOD RIGHT ANTECUBITAL  Final   Special Requests   Final    AEROBIC BOTTLE ONLY Blood Culture results may not be optimal due to an inadequate volume of blood received in culture bottles Performed at [facility], [place] [address], [place], [place] [address]    Culture NO GROWTH 5 DAYS  Final    Report Status 05/18/2018 FINAL  Final  MRSA PCR Screening     Status: Abnormal   Collection Time: 05/13/18  3:59 AM  Result Value Ref Range Status   MRSA by PCR POSITIVE (A) NEGATIVE Final    Comment:        The GeneXpert MRSA Assay (FDA approved for NASAL specimens only), is one component of a comprehensive MRSA colonization surveillance program. It is not intended to diagnose MRSA infection nor to guide or monitor treatment for MRSA infections. CRITICAL RESULT CALLED TO, READ BACK BY AND VERIFIED WITH: RN [facility]  FARRAR AT 8937 05/13/18 [place] A. Performed at [facility], [place] [address] [place], [place] [address]          Radiology Studies: No results found.      Scheduled Meds: . apixaban  5 mg Oral BID  . bethanechol  25 mg Oral 3 times per day  . cholecalciferol  2,000 Units Oral Daily  . folic acid  1 mg Oral Daily  . latanoprost  1 drop Both Eyes QHS  . levothyroxine  137 mcg Oral Q0600  . pregabalin  150 mg Oral BID  . tamsulosin  0.4 mg Oral QHS  . trimethoprim-polymyxin b  1 drop Left Eye Q4H   Continuous Infusions: . ceFEPime (MAXIPIME) IV 1 g (05/19/18 1029)     LOS: 6 days    Time spent:25  minutes    Hosie Poisson, MD [facility] Pager [phone]  If 7PM-7AM, please contact night-coverage [email or web address] Password [email or web address] 05/19/2018, 11:27 AM

## 2018-05-20 ENCOUNTER — Ambulatory Visit (HOSPITAL_COMMUNITY)
Admit: 2018-05-20 | Discharge: 2018-05-20 | Disposition: A | Discharge status: Home / Self Care | Payer: Medicare Other | Attending: Internal Medicine | Admitting: Internal Medicine

## 2018-05-20 DIAGNOSIS — K7689 Other specified diseases of liver: Secondary | ICD-10-CM | POA: Diagnosis not present

## 2018-05-20 LAB — CBC
HCT: 39 % (ref 39.0–52.0)
Hemoglobin: 12.4 g/dL — ABNORMAL LOW (ref 13.0–17.0)
MCH: 29.4 pg (ref 26.0–34.0)
MCHC: 31.8 g/dL (ref 30.0–36.0)
MCV: 92.4 fL (ref 80.0–100.0)
NRBC: 0 % (ref 0.0–0.2)
Platelets: 126 10*3/uL — ABNORMAL LOW (ref 150–400)
RBC: 4.22 MIL/uL (ref 4.22–5.81)
RDW: 15.6 % — ABNORMAL HIGH (ref 11.5–15.5)
WBC: 8.2 10*3/uL (ref 4.0–10.5)

## 2018-05-20 LAB — CREATININE, SERUM
Creatinine, Ser: 1.2 mg/dL (ref 0.61–1.24)
GFR calc non Af Amer: 55 mL/min — ABNORMAL LOW (ref >60)

## 2018-05-20 MED ORDER — GADOBUTROL 1 MMOL/ML IV SOLN
10.0000 mL | Freq: Once | INTRAVENOUS | Status: AC | PRN
  Administered 2018-05-20: 10 mL via INTRAVENOUS

## 2018-05-20 NOTE — Progress Notes (Signed)
PT Cancellation Note  Patient Details Name: Derek Hays MRN: [id number] DOB: [birth year]   Cancelled Treatment:     MRI @ [facility]  PTA Acute  Rehabilitation Services Pager      [phone] Office      [phone]

## 2018-05-20 NOTE — Care Management Note (Signed)
Case Management Note  Patient Details  Name: Derek Hays MRN: [id number] Date of Birth: 16-Sep-1930  Subjective/Objective:                  discharged  Action/Plan: hhc through [place] notified. Expected Discharge Date:  (unknown)               Expected Discharge Plan:  [place]  In-House Referral:     Discharge planning Services  CM Consult  Post Acute Care Choice:    Choice offered to:  Spouse  DME Arranged:    DME Agency:     HH Arranged:  RN, PT, OT, Nurse's Aide, Speech Therapy, Social Work [employer] Agency:  [facility]  Status of Service:  Completed, signed off  If discussed at [employer] of Stay Meetings, dates discussed:    Additional Comments:  Leeroy Cha, RN 05/20/2018, 8:39 AM

## 2018-05-20 NOTE — Progress Notes (Signed)
PROGRESS NOTE    Derek Hays  BDZ:[id number] DOB: 03/23/1931 DOA: 05/12/2018 [facility]: [facility]    Brief Narrative: Derek Hays a 85 y.o.malewithhistory of CVA with left-sided hemiparesis essentially bedbound, hypertension, atrial fibrillation, diastolic CHF, postoperative hypothyroidism was referred to the ER after patient's home health care nurse found that patient has been having productive cough for last 3 days discolored sputum with weakness and also had some episodes of nausea vomiting last 2 days and poor appetite he was admitted for pneumonia.   Assessment & Plan:   Principal Problem:   Pneumonia Active Problems:   Stage 3 chronic kidney disease (HCC)   PAF (paroxysmal atrial fibrillation) (HCC)   Benign essential HTN   Goals of care, counseling/discussion   Palliative care encounter   Right middle lobe pneumonia? Aspiration pneumonia.  CT chest notable for right pleural effusion he was initially started on broad-spectrum antibiotics with IV vancomycin and Zosyn, later on transitioned to cefepime.  05/20/2018: Patient already completed 7 days of cefepime.  He is afebrile without any leukocytosis.  Will DC cefepime.  Acute metabolic encephalopathy probably secondary to pneumonia He is improving he is alert and oriented to place and person. Pt appears to be back to baseline.   Moderate oropharyngeal dysphagia Speech pathology on board and palliative care consulted. Discussions ongoing.  05/20/2018: Speech therapy recommended dysphagia 1 diet.  Left eye conjunctivitis continue with otic antibiotics with Polytrim.   Paroxysmal atrial fibrillation Rate controlled continue with apixaban for anticoagulation.   History of chronic diastolic heart failure He appears to be compensated. -Continue current medications.  Stage III CKD Creatinine appears to be at baseline.no changes.    History of CVA with residual left-sided hemiparesis continue with  apixaban and statin  Ill-defined lesion in the liver on CT Incidental finding further work-up with MRI of the liver ordered.  05/20/2018: MRI of the liver to be done today.  Follow-up on the results.   History of chronic left hydro-ureteronephrosis with remote bladder cancer history Patient follows up with urology at [facility] no new recommendations at this time.  DVT prophylaxis: Apixaban Code Status: Full code Family Communication: Discussed with wife at bedside Disposition Plan: Pending MRI results   Consultants:   Palliative care consult  Procedures: MRI of the liver  Antimicrobials: Cefepime since admission; completed 7 days, stopped 05/20/2018.   Subjective: No new complaints.   Objective: Vitals:   05/19/18 2132 05/20/18 0500 05/20/18 0618 05/20/18 1309  BP: (!) 148/71  (!) 148/77 (!) 161/96  Pulse: 60  60 62  Resp: 14  13 16   Temp: 98.6 F (37 C)  98.7 F (37.1 C) 98.2 F (36.8 C)  TempSrc: Oral  Oral Oral  SpO2: 98%  94% 98%  Weight:  92 kg    Height:        Intake/Output Summary (Last 24 hours) at 05/20/2018 1520 Last data filed at 05/20/2018 1300 Gross per 24 hour  Intake 280 ml  Output 275 ml  Net 5 ml   Filed Weights   05/18/18 0520 05/19/18 0526 05/20/18 0500  Weight: 94.1 kg 94.2 kg 92 kg    Examination:  General exam: calm and comfortable, not in distress.  Respiratory system: air entry fair bilateral , no wheezing or rhonchi.  Cardiovascular system: S1 & S2 heard, RRR. No JVD, Gastrointestinal system: Abdomen is soft NT ND BS+ Central nervous system: Alert and oriented. Left hemiparesis.  Hard of hearing Extremities: Symmetric 5 x 5 power.LEFT UPPER EXTREMITY  swelling improving.  Skin: No rashes, lesions or ulcers Psychiatry:Mood & affect appropriate.     Data Reviewed: I have personally reviewed following labs and imaging studies  CBC: Recent Labs  Lab 05/15/18 0622 05/16/18 0626 05/17/18 0605 05/20/18 0633  WBC 7.0 8.4 7.8 8.2  HGB  12.6* 12.6* 12.8* 12.4*  HCT 39.5 39.3 40.8 39.0  MCV 91.6 92.0 90.3 92.4  PLT 145* 149* 165 665*   Basic Metabolic Panel: Recent Labs  Lab 05/15/18 0622 05/16/18 0626 05/17/18 0605 05/20/18 0633  NA 143 145 144  --   K 3.6 3.5 3.5  --   CL 110 113* 111  --   CO2 26 25 24   --   GLUCOSE 99 113* 98  --   BUN 28* 23 20  --   CREATININE 1.37* 1.39* 1.30* 1.20  CALCIUM 9.0 8.9 9.0  --    GFR: Estimated Creatinine Clearance: 49.4 mL/min (by C-G formula based on SCr of 1.2 mg/dL). Liver Function Tests: No results for input(s): AST, ALT, ALKPHOS, BILITOT, PROT, ALBUMIN in the last 168 hours. No results for input(s): LIPASE, AMYLASE in the last 168 hours. No results for input(s): AMMONIA in the last 168 hours. Coagulation Profile: No results for input(s): INR, PROTIME in the last 168 hours. Cardiac Enzymes: No results for input(s): CKTOTAL, CKMB, CKMBINDEX, TROPONINI in the last 168 hours. BNP (last 3 results) No results for input(s): PROBNP in the last 8760 hours. HbA1C: No results for input(s): HGBA1C in the last 72 hours. CBG: No results for input(s): GLUCAP in the last 168 hours. Lipid Profile: No results for input(s): CHOL, HDL, LDLCALC, TRIG, CHOLHDL, LDLDIRECT in the last 72 hours. Thyroid Function Tests: No results for input(s): TSH, T4TOTAL, FREET4, T3FREE, THYROIDAB in the last 72 hours. Anemia Panel: No results for input(s): VITAMINB12, FOLATE, FERRITIN, TIBC, IRON, RETICCTPCT in the last 72 hours. Sepsis Labs: No results for input(s): PROCALCITON, LATICACIDVEN in the last 168 hours.  Recent Results (from the past 240 hour(s))  Urine culture     Status: Abnormal   Collection Time: 05/12/18  9:28 PM  Result Value Ref Range Status   Specimen Description   Final    URINE, CLEAN CATCH Performed at [facility], [place] [address] [place], [place] [address]    Special Requests   Final    NONE Performed at [facility], [place]  [address], [place], [place] [address]    Culture MULTIPLE SPECIES PRESENT, SUGGEST RECOLLECTION (A)  Final   Report Status 05/14/2018 FINAL  Final  Culture, blood (routine x 2)     Status: Abnormal   Collection Time: 05/12/18  9:28 PM  Result Value Ref Range Status   Specimen Description   Final    BLOOD BLOOD RIGHT FOREARM Performed at [place] [address], [place], [place] [address]    Special Requests   Final    BOTTLES DRAWN AEROBIC AND ANAEROBIC Blood Culture adequate volume Performed at [place] [address], [place], [place] [address]    Culture  Setup Time   Final    IN BOTH AEROBIC AND ANAEROBIC BOTTLES GRAM POSITIVE COCCI CRITICAL RESULT CALLED TO, READ BACK BY AND VERIFIED WITHLavell Luster [facility] 0076 05/14/18 A BROWNING    Culture (A)  Final    STAPHYLOCOCCUS SPECIES (COAGULASE NEGATIVE) THE SIGNIFICANCE OF ISOLATING THIS ORGANISM FROM A SINGLE SET OF BLOOD CULTURES WHEN MULTIPLE SETS ARE DRAWN IS UNCERTAIN. PLEASE NOTIFY  THE MICROBIOLOGY DEPARTMENT WITHIN ONE WEEK IF SPECIATION AND SENSITIVITIES ARE REQUIRED. Performed at [facility], [place] [address] [place], [place] [address]    Report Status 05/15/2018 FINAL  Final  Blood Culture ID Panel (Reflexed)     Status: Abnormal   Collection Time: 05/12/18  9:28 PM  Result Value Ref Range Status   Enterococcus species NOT DETECTED NOT DETECTED Final   Listeria monocytogenes NOT DETECTED NOT DETECTED Final   Staphylococcus species DETECTED (A) NOT DETECTED Final    Comment: Methicillin (oxacillin) resistant coagulase negative staphylococcus. Possible blood culture contaminant (unless isolated from more than one blood culture draw or clinical case suggests pathogenicity). No antibiotic treatment is indicated for blood  culture contaminants. CRITICAL RESULT CALLED TO, READ BACK BY AND VERIFIED WITHLavell Luster [facility] 7673 05/14/18 A BROWNING    Staphylococcus aureus (BCID)  NOT DETECTED NOT DETECTED Final   Methicillin resistance DETECTED (A) NOT DETECTED Final    Comment: CRITICAL RESULT CALLED TO, READ BACK BY AND VERIFIED WITH: Lavell Luster PHARMD [id number] 05/14/18 A BROWNING    Streptococcus species NOT DETECTED NOT DETECTED Final   Streptococcus agalactiae NOT DETECTED NOT DETECTED Final   Streptococcus pneumoniae NOT DETECTED NOT DETECTED Final   Streptococcus pyogenes NOT DETECTED NOT DETECTED Final   Acinetobacter baumannii NOT DETECTED NOT DETECTED Final   Enterobacteriaceae species NOT DETECTED NOT DETECTED Final   Enterobacter cloacae complex NOT DETECTED NOT DETECTED Final   Escherichia coli NOT DETECTED NOT DETECTED Final   Klebsiella oxytoca NOT DETECTED NOT DETECTED Final   Klebsiella pneumoniae NOT DETECTED NOT DETECTED Final   Proteus species NOT DETECTED NOT DETECTED Final   Serratia marcescens NOT DETECTED NOT DETECTED Final   Haemophilus influenzae NOT DETECTED NOT DETECTED Final   Neisseria meningitidis NOT DETECTED NOT DETECTED Final   Pseudomonas aeruginosa NOT DETECTED NOT DETECTED Final   Candida albicans NOT DETECTED NOT DETECTED Final   Candida glabrata NOT DETECTED NOT DETECTED Final   Candida krusei NOT DETECTED NOT DETECTED Final   Candida parapsilosis NOT DETECTED NOT DETECTED Final   Candida tropicalis NOT DETECTED NOT DETECTED Final    Comment: Performed at [facility], [place] [address] [place], [place] [address]  Culture, blood (routine x 2)     Status: None   Collection Time: 05/12/18  9:29 PM  Result Value Ref Range Status   Specimen Description BLOOD RIGHT ANTECUBITAL  Final   Special Requests   Final    AEROBIC BOTTLE ONLY Blood Culture results may not be optimal due to an inadequate volume of blood received in culture bottles Performed at [facility], [place] [address], [place], [place] [address]    Culture NO GROWTH 5 DAYS  Final   Report Status 05/18/2018 FINAL  Final  MRSA PCR Screening      Status: Abnormal   Collection Time: 05/13/18  3:59 AM  Result Value Ref Range Status   MRSA by PCR POSITIVE (A) NEGATIVE Final    Comment:        The GeneXpert MRSA Assay (FDA approved for NASAL specimens only), is one component of a comprehensive MRSA colonization surveillance program. It is not intended to diagnose MRSA infection nor to guide or monitor treatment for MRSA infections. CRITICAL RESULT CALLED TO, READ BACK BY AND VERIFIED WITH: RN [facility] AT 3299 05/13/18 [place] A. Performed at [facility], [place] [address], [place], [place] [address]  Radiology Studies: No results found.      Scheduled Meds: . apixaban  5 mg Oral BID  . bethanechol  25 mg Oral 3 times per day  . cholecalciferol  2,000 Units Oral Daily  . folic acid  1 mg Oral Daily  . latanoprost  1 drop Both Eyes QHS  . levothyroxine  137 mcg Oral Q0600  . pregabalin  150 mg Oral BID  . tamsulosin  0.4 mg Oral QHS  . trimethoprim-polymyxin b  1 drop Left Eye Q4H   Continuous Infusions: . ceFEPime (MAXIPIME) IV 1 g (05/20/18 1048)     LOS: 7 days    Time spent:25  minutes    Yaakov Guthrie, MD [facility] Pager on [place]  If 7PM-7AM, please contact night-coverage [email or web address] Password [email or web address] 05/20/2018, 3:20 PM

## 2018-05-21 ENCOUNTER — Inpatient Hospital Stay (HOSPITAL_COMMUNITY): Payer: Medicare Other

## 2018-05-21 DIAGNOSIS — R160 Hepatomegaly, not elsewhere classified: Secondary | ICD-10-CM

## 2018-05-21 LAB — BASIC METABOLIC PANEL
Anion gap: 10 (ref 5–15)
BUN: 19 mg/dL (ref 8–23)
CO2: 22 mmol/L (ref 22–32)
Calcium: 9.2 mg/dL (ref 8.9–10.3)
Chloride: 112 mmol/L — ABNORMAL HIGH (ref 98–111)
Creatinine, Ser: 1.18 mg/dL (ref 0.61–1.24)
GFR calc non Af Amer: 56 mL/min — ABNORMAL LOW (ref >60)
Glucose, Bld: 76 mg/dL (ref 70–99)
Potassium: 3.9 mmol/L (ref 3.5–5.1)
SODIUM: 144 mmol/L (ref 135–145)

## 2018-05-21 LAB — CBC
HCT: 39.4 % (ref 39.0–52.0)
Hemoglobin: 12.5 g/dL — ABNORMAL LOW (ref 13.0–17.0)
MCH: 29.5 pg (ref 26.0–34.0)
MCHC: 31.7 g/dL (ref 30.0–36.0)
MCV: 92.9 fL (ref 80.0–100.0)
Platelets: 122 10*3/uL — ABNORMAL LOW (ref 150–400)
RBC: 4.24 MIL/uL (ref 4.22–5.81)
RDW: 15.6 % — ABNORMAL HIGH (ref 11.5–15.5)
WBC: 7.4 10*3/uL (ref 4.0–10.5)
nRBC: 0 % (ref 0.0–0.2)

## 2018-05-21 NOTE — Progress Notes (Signed)
SLP Cancellation Note  Patient Details Name: MONTI JILEK MRN: [id number] DOB: March 13, 1931   Cancelled treatment:       Reason Eval/Treat Not Completed: Other (comment)(pt remains npo, note plans to pursue further testing of liver)   Macario Golds 05/21/2018, 6:18 PM  Luanna Salk, [place] [facility] SLP [place] Pager [phone] Office [phone]

## 2018-05-21 NOTE — Progress Notes (Signed)
PROGRESS NOTE    AARIV MEDLOCK  FHQ:[id number] DOB: 1931/01/31 DOA: 05/12/2018 PCP: [facility]    Brief Narrative: Pavan Bring Harrellis a 85 y.o.malewithhistory of CVA with left-sided hemiparesis essentially bedbound, hypertension, atrial fibrillation, diastolic CHF, postoperative hypothyroidism was referred to the ER after patient's home health care nurse found that patient has been having productive cough for 3 days discolored sputum with weakness and also had some episodes of nausea vomiting and poor appetite. He was admitted for pneumonia.   Assessment & Plan:   Right middle lobe pneumonia? Aspiration pneumonia.  CT chest notable for right pleural effusion he was initially started on broad-spectrum antibiotics with IV vancomycin and Zosyn, later on transitioned to cefepime.  Patient has completed course of antibiotics.  Acute metabolic encephalopathy probably secondary to pneumonia Mentation has improved.  Appears to be back to his baseline.  He is extremely hard of hearing.    Moderate oropharyngeal dysphagia Patient seen by palliative medicine.  Also seen by speech therapy.  Underwent modified barium swallow.  Dysphagia 1 diet recommended.    Ill-defined lesion in the liver on CT MRI liver was done which raised concern for a malignant tumor.  Patient will need biopsy for definitive diagnosis.  Discussed with patient as well as his wife.  They are keen on pursuing.  However patient is on apixaban which will have to be held.  IR will be consulted.  Apixaban held for now.  AFP.  Left eye conjunctivitis Continue with otic antibiotics with Polytrim.  Paroxysmal atrial fibrillation Rate controlled. Continue with apixaban for anticoagulation.  History of chronic diastolic heart failure He appears to be compensated. Continue current medications.  Stage III CKD Creatinine appears to be at baseline.no changes.   History of CVA with residual left-sided  hemiparesis Continue with apixaban and statin  History of chronic left hydro-ureteronephrosis with remote bladder cancer history Patient follows up with urology at [facility]. No new recommendations at this time.  DVT prophylaxis: Apixaban Code Status: Full code Family Communication: Discussed with patient and his wife Disposition Plan: PT is recommending skilled nursing facility versus home health.  Discussed with wife.  She would ideally like for him to come home.  At the same time she wants to pursue biopsy of the liver mass.  Patient is on apixaban.  It be very difficult for the patient's wife to transport him back and forth for the biopsy.  So this would ideally need to be done in the hospital.  We will hold apixaban today.  Consult interventional radiology.   Consultants:   Palliative care consult  Interventional radiology  Procedures:    Antimicrobials: Cefepime since admission; completed 7 days, stopped 05/20/2018.   Subjective: Patient is states that he feels well.  Denies any nausea vomiting.  Objective: Vitals:   05/20/18 1309 05/20/18 2005 05/20/18 2057 05/21/18 0518  BP: (!) 161/96  (!) 164/89 (!) 166/76  Pulse: 62 60 (!) 59 65  Resp: 16 18 18 20   Temp: 98.2 F (36.8 C)  98.5 F (36.9 C) 100 F (37.8 C)  TempSrc: Oral  Oral Oral  SpO2: 98%  98% 93%  Weight:      Height:        Intake/Output Summary (Last 24 hours) at 05/21/2018 1412 Last data filed at 05/21/2018 0900 Gross per 24 hour  Intake 0 ml  Output -  Net 0 ml   Filed Weights   05/18/18 0520 05/19/18 0526 05/20/18 0500  Weight: 94.1 kg 94.2 kg  92 kg    Examination: General appearance: Awake alert.  In no distress.  Hard of hearing Resp: Clear to auscultation bilaterally.  Normal effort Cardio: S1-S2 is normal regular.  No S3-S4.  No rubs murmurs or bruit GI: Abdomen is soft.  Nontender nondistended.  Bowel sounds are present normal.  No masses organomegaly Extremities: No edema.  Full range of motion  of lower extremities. Neurologic: Left hemiparesis.   Data Reviewed: I have personally reviewed following labs and imaging studies  CBC: Recent Labs  Lab 05/15/18 0622 05/16/18 0626 05/17/18 0605 05/20/18 0633 05/21/18 0629  WBC 7.0 8.4 7.8 8.2 7.4  HGB 12.6* 12.6* 12.8* 12.4* 12.5*  HCT 39.5 39.3 40.8 39.0 39.4  MCV 91.6 92.0 90.3 92.4 92.9  PLT 145* 149* 165 126* 194*   Basic Metabolic Panel: Recent Labs  Lab 05/15/18 0622 05/16/18 0626 05/17/18 0605 05/20/18 0633 05/21/18 0629  NA 143 145 144  --  144  K 3.6 3.5 3.5  --  3.9  CL 110 113* 111  --  112*  CO2 26 25 24   --  22  GLUCOSE 99 113* 98  --  76  BUN 28* 23 20  --  19  CREATININE 1.37* 1.39* 1.30* 1.20 1.18  CALCIUM 9.0 8.9 9.0  --  9.2   GFR: Estimated Creatinine Clearance: 50.2 mL/min (by C-G formula based on SCr of 1.18 mg/dL).   Recent Results (from the past 240 hour(s))  Urine culture     Status: Abnormal   Collection Time: 05/12/18  9:28 PM  Result Value Ref Range Status   Specimen Description   Final    URINE, CLEAN CATCH Performed at [facility], [place] [address], [place], [place] [address]    Special Requests   Final    NONE Performed at [facility], [place] [address], [place], [place] [address]    Culture MULTIPLE SPECIES PRESENT, SUGGEST RECOLLECTION (A)  Final   Report Status 05/14/2018 FINAL  Final  Culture, blood (routine x 2)     Status: Abnormal   Collection Time: 05/12/18  9:28 PM  Result Value Ref Range Status   Specimen Description   Final    BLOOD BLOOD RIGHT FOREARM Performed at [place] [address], [place], [place] [address]    Special Requests   Final    BOTTLES DRAWN AEROBIC AND ANAEROBIC Blood Culture adequate volume Performed at [place] [address] [place], [place] [address]    Culture  Setup Time   Final    IN BOTH AEROBIC AND ANAEROBIC BOTTLES GRAM POSITIVE COCCI CRITICAL  RESULT CALLED TO, READ BACK BY AND VERIFIED WITHLavell Luster [facility] 6378 05/14/18 A BROWNING    Culture (A)  Final    STAPHYLOCOCCUS SPECIES (COAGULASE NEGATIVE) THE SIGNIFICANCE OF ISOLATING THIS ORGANISM FROM A SINGLE SET OF BLOOD CULTURES WHEN MULTIPLE SETS ARE DRAWN IS UNCERTAIN. PLEASE NOTIFY THE MICROBIOLOGY DEPARTMENT WITHIN ONE WEEK IF SPECIATION AND SENSITIVITIES ARE REQUIRED. Performed at [facility], [place] [address] [place], [place] [address]    Report Status 05/15/2018 FINAL  Final  Blood Culture ID Panel (Reflexed)     Status: Abnormal   Collection Time: 05/12/18  9:28 PM  Result Value Ref Range Status   Enterococcus species NOT DETECTED NOT DETECTED Final   Listeria monocytogenes NOT DETECTED NOT DETECTED Final   Staphylococcus species DETECTED (A) NOT DETECTED Final    Comment: Methicillin (oxacillin)  resistant coagulase negative staphylococcus. Possible blood culture contaminant (unless isolated from more than one blood culture draw or clinical case suggests pathogenicity). No antibiotic treatment is indicated for blood  culture contaminants. CRITICAL RESULT CALLED TO, READ BACK BY AND VERIFIED WITHLavell Luster [facility] 4696 05/14/18 A BROWNING    Staphylococcus aureus (BCID) NOT DETECTED NOT DETECTED Final   Methicillin resistance DETECTED (A) NOT DETECTED Final    Comment: CRITICAL RESULT CALLED TO, READ BACK BY AND VERIFIED WITH: Lavell Luster PHARMD [id number] 05/14/18 A BROWNING    Streptococcus species NOT DETECTED NOT DETECTED Final   Streptococcus agalactiae NOT DETECTED NOT DETECTED Final   Streptococcus pneumoniae NOT DETECTED NOT DETECTED Final   Streptococcus pyogenes NOT DETECTED NOT DETECTED Final   Acinetobacter baumannii NOT DETECTED NOT DETECTED Final   Enterobacteriaceae species NOT DETECTED NOT DETECTED Final   Enterobacter cloacae complex NOT DETECTED NOT DETECTED Final   Escherichia coli NOT DETECTED NOT DETECTED Final   Klebsiella oxytoca NOT DETECTED  NOT DETECTED Final   Klebsiella pneumoniae NOT DETECTED NOT DETECTED Final   Proteus species NOT DETECTED NOT DETECTED Final   Serratia marcescens NOT DETECTED NOT DETECTED Final   Haemophilus influenzae NOT DETECTED NOT DETECTED Final   Neisseria meningitidis NOT DETECTED NOT DETECTED Final   Pseudomonas aeruginosa NOT DETECTED NOT DETECTED Final   Candida albicans NOT DETECTED NOT DETECTED Final   Candida glabrata NOT DETECTED NOT DETECTED Final   Candida krusei NOT DETECTED NOT DETECTED Final   Candida parapsilosis NOT DETECTED NOT DETECTED Final   Candida tropicalis NOT DETECTED NOT DETECTED Final    Comment: Performed at [facility], [place]. [address] [place], [place] [address]  Culture, blood (routine x 2)     Status: None   Collection Time: 05/12/18  9:29 PM  Result Value Ref Range Status   Specimen Description BLOOD RIGHT ANTECUBITAL  Final   Special Requests   Final    AEROBIC BOTTLE ONLY Blood Culture results may not be optimal due to an inadequate volume of blood received in culture bottles Performed at [facility], [place] [address], [place], [place] [address]    Culture NO GROWTH 5 DAYS  Final   Report Status 05/18/2018 FINAL  Final  MRSA PCR Screening     Status: Abnormal   Collection Time: 05/13/18  3:59 AM  Result Value Ref Range Status   MRSA by PCR POSITIVE (A) NEGATIVE Final    Comment:        The GeneXpert MRSA Assay (FDA approved for NASAL specimens only), is one component of a comprehensive MRSA colonization surveillance program. It is not intended to diagnose MRSA infection nor to guide or monitor treatment for MRSA infections. CRITICAL RESULT CALLED TO, READ BACK BY AND VERIFIED WITH: RN [facility] AT 2725 05/13/18 [place] A. Performed at [facility], [place] [address], [place], [place] [address]          Radiology Studies: Mr Liver W Wo Contrast  Result Date: 05/20/2018 CLINICAL DATA:  Hepatic mass  suspected on recent noncontrast CT. EXAM: MRI ABDOMEN WITHOUT AND WITH CONTRAST TECHNIQUE: Multiplanar multisequence MR imaging of the abdomen was performed both before and after the administration of intravenous contrast. CONTRAST:  10 mL Gadavist COMPARISON:  Noncontrast CT on 05/13/2018 FINDINGS: Lower chest: Moderate right and tiny left pleural effusions again noted. Hepatobiliary: A large heterogeneously enhancing soft tissue mass is seen in the central liver involving both the right and left lobes  and liver hilum. This measures 11.3 x 8.3 cm. Portal and hepatic veins remain patent. There is mild peripheral intrahepatic biliary ductal dilatation seen in the right hepatic lobe, but there is no evidence of extrahepatic biliary ductal dilatation. Pancreas:  No mass or inflammatory changes. Spleen:  Within normal limits in size and appearance. Adrenals/Urinary Tract: Normal adrenal glands. The right kidney is severely atrophic and nonfunctioning. No left renal masses are identified, however severe left hydronephrosis is demonstrated as seen on previous CT. Stomach/Bowel: Visualized portion unremarkable. Vascular/Lymphatic: No pathologically enlarged lymph nodes identified. No abdominal aortic aneurysm. Other:  None. Musculoskeletal:  No suspicious bone lesions identified. IMPRESSION: 11.3 cm heterogeneously enhancing mass involving the central right and left lobes and liver hilum. Differential diagnosis includes intrahepatic cholangiocarcinoma, gallbladder carcinoma, hepatocellular carcinoma, or less likely liver metastasis. No other sites of malignancy identified within the abdomen. Severe left hydronephrosis as demonstrated on recent CAP CT. Moderate right pleural effusion and tiny left pleural effusion. Electronically Signed   By: Earle Gell M.D.   On: 05/20/2018 16:11        Scheduled Meds: . apixaban  5 mg Oral BID  . bethanechol  25 mg Oral 3 times per day  . cholecalciferol  2,000 Units Oral Daily   . folic acid  1 mg Oral Daily  . latanoprost  1 drop Both Eyes QHS  . levothyroxine  137 mcg Oral Q0600  . pregabalin  150 mg Oral BID  . tamsulosin  0.4 mg Oral QHS  . trimethoprim-polymyxin b  1 drop Left Eye Q4H   Continuous Infusions:    LOS: 8 days     Bonnielee Haff, MD [facility] Pager on [facility]   05/21/2018, 2:12 PM

## 2018-05-21 NOTE — Progress Notes (Signed)
Physical Therapy Treatment Patient Details Name: Derek Hays MRN: [id number] DOB: Oct 13, 1930 Today's Date: 05/21/2018    History of Present Illness Patient is an 85 y/o male presenting to the ED on 05/12/18 with primary complaints of cough, fatigue. Past history of CVA with left-sided hemiparesis essentially bedbound, hypertension, atrial fibrillation, diastolic CHF, postoperative hypothyroidism. Admitted for PNA work-up.    PT Comments    Assisted OOB to recliner.  General bed mobility comments: Total A + 2 pt 0% with transition.  Once upright pt was able to static sit EOB x 4 min at [employer] Assist.  General transfer comment: from elevated surface "[place]" 1/4 towards pts R to recliner.  Pt stated he uses a lift at home.  Lift pad in recliner for nursing to assist pt back to bed.   Follow Up Recommendations  SNF;Home health PT(pending medical )     Equipment Recommendations  None recommended by PT    Recommendations for Other Services       Precautions / Restrictions Precautions Precautions: Fall Precaution Comments: CVA L hemi (2019) Restrictions Weight Bearing Restrictions: No    Mobility  Bed Mobility Overal bed mobility: Needs Assistance Bed Mobility: Supine to Sit     Supine to sit: Total assist;+2 for physical assistance;+2 for safety/equipment     General bed mobility comments: Total A + 2 pt 0% with transition.  Once upright pt was able to static sit EOB x 4 min at [facility] Assist.    Transfers Overall transfer level: Needs assistance Equipment used: None Transfers: Sit to/from [facility] Sit to Stand: +2 physical assistance;Total assist;+2 safety/equipment Stand pivot transfers: Total assist;+2 physical assistance;+2 safety/equipment       General transfer comment: from elevated surface "[place]" 1/4 towards pts R to recliner.  Pt stated he uses a lift at home.  Lift pad in recliner for nursing to assist pt back to  bed.  Ambulation/Gait             General Gait Details: non amb since CVA 07/2017   Stairs             Wheelchair Mobility    Modified Rankin (Stroke Patients Only)       Balance                                            Cognition Arousal/Alertness: Awake/alert Behavior During Therapy: WFL for tasks assessed/performed Overall Cognitive Status: Within Functional Limits for tasks assessed                                 General Comments: improved.  AxO x 3 able to give history and aware of current situation       Exercises      General Comments        Pertinent Vitals/Pain Pain Assessment: No/denies pain    Home Living                      Prior Function            PT Goals (current goals can now be found in the care plan section) Progress towards PT goals: Progressing toward goals    Frequency    Min 2X/week      PT Plan Current plan remains  appropriate    Co-evaluation              AM-PAC PT "6 Clicks" Mobility   Outcome Measure  Help needed turning from your back to your side while in a flat bed without using bedrails?: Total Help needed moving from lying on your back to sitting on the side of a flat bed without using bedrails?: Total Help needed moving to and from a bed to a chair (including a wheelchair)?: Total Help needed standing up from a chair using your arms (e.g., wheelchair or bedside chair)?: Total Help needed to walk in hospital room?: Total Help needed climbing 3-5 steps with a railing? : Total 6 Click Score: 6    End of Session Equipment Utilized During Treatment: Gait belt Activity Tolerance: Patient tolerated treatment well Patient left: in chair;with call bell/phone within reach;with chair alarm set Nurse Communication: Mobility status;Need for lift equipment PT Visit Diagnosis: Unsteadiness on feet (R26.81)     Time: 3888-7579 PT Time Calculation (min) (ACUTE  ONLY): 24 min  Charges:  $Therapeutic Activity: 23-37 mins                     Rica Koyanagi  PTA Acute  Rehabilitation Services Pager      [phone] Office      [phone]

## 2018-05-22 LAB — COMPREHENSIVE METABOLIC PANEL
ALT: 19 U/L (ref 0–44)
AST: 35 U/L (ref 15–41)
Albumin: 3.1 g/dL — ABNORMAL LOW (ref 3.5–5.0)
Alkaline Phosphatase: 244 U/L — ABNORMAL HIGH (ref 38–126)
Anion gap: 10 (ref 5–15)
BUN: 21 mg/dL (ref 8–23)
CO2: 24 mmol/L (ref 22–32)
Calcium: 9.5 mg/dL (ref 8.9–10.3)
Chloride: 111 mmol/L (ref 98–111)
Creatinine, Ser: 1.23 mg/dL (ref 0.61–1.24)
GFR calc non Af Amer: 53 mL/min — ABNORMAL LOW (ref >60)
Glucose, Bld: 77 mg/dL (ref 70–99)
Potassium: 3.9 mmol/L (ref 3.5–5.1)
Sodium: 145 mmol/L (ref 135–145)
Total Bilirubin: 1.6 mg/dL — ABNORMAL HIGH (ref 0.3–1.2)
Total Protein: 6.7 g/dL (ref 6.5–8.1)

## 2018-05-22 LAB — CBC
HCT: 43.9 % (ref 39.0–52.0)
Hemoglobin: 13.7 g/dL (ref 13.0–17.0)
MCH: 28.7 pg (ref 26.0–34.0)
MCHC: 31.2 g/dL (ref 30.0–36.0)
MCV: 92 fL (ref 80.0–100.0)
Platelets: 149 10*3/uL — ABNORMAL LOW (ref 150–400)
RBC: 4.77 MIL/uL (ref 4.22–5.81)
RDW: 15.5 % (ref 11.5–15.5)
WBC: 8.2 10*3/uL (ref 4.0–10.5)
nRBC: 0 % (ref 0.0–0.2)

## 2018-05-22 LAB — PROTIME-INR
INR: 1.45
Prothrombin Time: 17.5 seconds — ABNORMAL HIGH (ref 11.4–15.2)

## 2018-05-22 MED ORDER — HYDRALAZINE HCL 20 MG/ML IJ SOLN
10.0000 mg | Freq: Once | INTRAMUSCULAR | Status: AC
  Administered 2018-05-22: 10 mg via INTRAVENOUS
  Filled 2018-05-22: qty 1

## 2018-05-22 MED ORDER — HYDRALAZINE HCL 20 MG/ML IJ SOLN
5.0000 mg | Freq: Four times a day (QID) | INTRAMUSCULAR | Status: DC | PRN
  Filled 2018-05-22: qty 1

## 2018-05-22 MED ORDER — AMLODIPINE BESYLATE 5 MG PO TABS
5.0000 mg | ORAL_TABLET | Freq: Every day | ORAL | Status: DC
  Administered 2018-05-22 – 2018-05-24 (×3): 5 mg via ORAL
  Filled 2018-05-22 (×3): qty 1

## 2018-05-22 NOTE — Progress Notes (Signed)
PROGRESS NOTE    Derek Hays  UYQ:[id number] DOB: January 20, 1931 DOA: 05/12/2018 [facility]    Brief Narrative: Derek Hays a 85 y.o.malewithhistory of CVA with left-sided hemiparesis essentially bedbound, hypertension, atrial fibrillation, diastolic CHF, postoperative hypothyroidism was referred to the ER after patient's home health care nurse found that patient has been having productive cough for 3 days discolored sputum with weakness and also had some episodes of nausea vomiting and poor appetite. He was admitted for pneumonia.   Assessment & Plan:   Right middle lobe pneumonia? Aspiration pneumonia.  CT chest notable for right pleural effusion he was initially started on broad-spectrum antibiotics with IV vancomycin and Zosyn, later on transitioned to cefepime.  Patient has completed course of antibiotics.  Respiratory status remains stable.  Normal.  He saturating normal on room air.  He remains afebrile.  Acute metabolic encephalopathy probably secondary to pneumonia Mentation has improved.  Appears to be back to his baseline.  He is extremely hard of hearing.    Moderate oropharyngeal dysphagia Patient seen by palliative medicine.  Also seen by speech therapy.  Underwent modified barium swallow.  Dysphagia 1 diet recommended.    Liver lesion MRI liver was done which raised concern for a malignant tumor.  Patient will need biopsy for definitive diagnosis.  This was discussed with patient as well as his wife.  Patient does want to know the diagnosis.  He however is not sure if he would want to pursue treatment if this is malignancy.  Patient was given the option of not undergoing biopsy at all.  However after further discussion he wants to proceed.  Ask about is on hold.  Biopsy is tentatively planned for tomorrow.  Check AFP.  Left eye conjunctivitis Continue with topical antibiotics with Polytrim.  Paroxysmal atrial fibrillation Rate controlled.  She  is on apixaban for anticoagulation.  On hold due to need for biopsy.  History of chronic diastolic heart failure He appears to be compensated. Continue current medications.  Stage III CKD Creatinine appears to be at baseline.no changes.   History of CVA with residual left-sided hemiparesis Continue with apixaban and statin  History of chronic left hydro-ureteronephrosis with remote bladder cancer history Patient follows up with [facility]rology at [facility] No new recommendations at this time.  DVT prophylaxis: Apixaban Code Status: Full code Family Communication: Discussed with patient and his wife Disposition Plan: PT is recommending skilled nursing facility versus home health.  Discussed with wife.  She would ideally like for him to come home.  Plan is for liver biopsy to be done tomorrow.  Will need to observe him at least for 24 hours after this biopsy so tentative discharge will be on Saturday.     Consultants:   Palliative care consult  Interventional radiology  Procedures:    Antimicrobials: Cefepime since admission; completed 7 days, stopped 05/20/2018.   Subjective: Patient states that he feels well.  Denies any abdominal pain.  No shortness of breath.  Occasional dry cough.  Objective: Vitals:   05/21/18 2100 05/22/18 0435 05/22/18 0500 05/22/18 0800  BP: (!) 160/85 (!) 181/87  (!) 177/75  Pulse: 60 (!) 59  60  Resp: 16 17    Temp: 97.9 F (36.6 C) 98 F (36.7 C)  (!) 97.5 F (36.4 C)  TempSrc: Oral Oral  Oral  SpO2: 96% 97%  99%  Weight:   89.7 kg   Height:        Intake/Output Summary (Last 24 hours) at  05/22/2018 1237 Last data filed at 05/22/2018 0640 Gross per 24 hour  Intake 180 ml  Output 390 ml  Net -210 ml   Filed Weights   05/19/18 0526 05/20/18 0500 05/22/18 0500  Weight: 94.2 kg 92 kg 89.7 kg    Examination:  General appearance: Awake alert.  In no distress.  Hard of hearing Resp: Coarse breath sounds.  Normal effort Cardio: S1-S2 is normal  regular.  No S3-S4.  No rubs murmurs or bruit GI: Abdomen is soft.  Nontender nondistended.  Bowel sounds are present normal.  No masses organomegaly Extremities: No edema.  Full range of motion of lower extremities. Neurologic: Light and oriented x3.  Left hemiparesis.    Data Reviewed: I have personally reviewed following labs and imaging studies  CBC: Recent Labs  Lab 05/16/18 0626 05/17/18 0605 05/20/18 0633 05/21/18 0629 05/22/18 0558  WBC 8.4 7.8 8.2 7.4 8.2  HGB 12.6* 12.8* 12.4* 12.5* 13.7  HCT 39.3 40.8 39.0 39.4 43.9  MCV 92.0 90.3 92.4 92.9 92.0  PLT 149* 165 126* 122* 811*   Basic Metabolic Panel: Recent Labs  Lab 05/16/18 0626 05/17/18 0605 05/20/18 0633 05/21/18 0629 05/22/18 0558  NA 145 144  --  144 145  K 3.5 3.5  --  3.9 3.9  CL 113* 111  --  112* 111  CO2 25 24  --  22 24  GLUCOSE 113* 98  --  76 77  BUN 23 20  --  19 21  CREATININE 1.39* 1.30* 1.20 1.18 1.23  CALCIUM 8.9 9.0  --  9.2 9.5   GFR: Estimated Creatinine Clearance: 48.2 mL/min (by C-G formula based on SCr of 1.23 mg/dL).   Recent Results (from the past 240 hour(s))  Urine culture     Status: Abnormal   Collection Time: 05/12/18  9:28 PM  Result Value Ref Range Status   Specimen Description   Final    URINE, CLEAN CATCH Performed at [facility], [place] [address], [place], [place] [address]    Special Requests   Final    NONE Performed at [facility], [place] [address], [place], [place] [address]    Culture MULTIPLE SPECIES PRESENT, SUGGEST RECOLLECTION (A)  Final   Report Status 05/14/2018 FINAL  Final  Culture, blood (routine x 2)     Status: Abnormal   Collection Time: 05/12/18  9:28 PM  Result Value Ref Range Status   Specimen Description   Final    BLOOD BLOOD RIGHT FOREARM Performed at [place] [address], [place], [place] [address]    Special Requests   Final    BOTTLES DRAWN AEROBIC AND ANAEROBIC Blood  Culture adequate volume Performed at [place] [address], [place],  [address]    Culture  Setup Time   Final    IN BOTH AEROBIC AND ANAEROBIC BOTTLES GRAM POSITIVE COCCI CRITICAL RESULT CALLED TO, READ BACK BY AND VERIFIED WITHLavell Luster [facility] 9629 05/14/18 A BROWNING    Culture (A)  Final    STAPHYLOCOCCUS SPECIES (COAGULASE NEGATIVE) THE SIGNIFICANCE OF ISOLATING THIS ORGANISM FROM A SINGLE SET OF BLOOD CULTURES WHEN MULTIPLE SETS ARE DRAWN IS UNCERTAIN. PLEASE NOTIFY THE MICROBIOLOGY DEPARTMENT WITHIN ONE WEEK IF SPECIATION AND SENSITIVITIES ARE REQUIRED. Performed at [facility], [place] [address], [place],  [address]    Report Status 05/15/2018 FINAL  Final  Blood Culture ID Panel (Reflexed)     Status:  Abnormal   Collection Time: 05/12/18  9:28 PM  Result Value Ref Range Status   Enterococcus species NOT DETECTED NOT DETECTED Final   Listeria monocytogenes NOT DETECTED NOT DETECTED Final   Staphylococcus species DETECTED (A) NOT DETECTED Final    Comment: Methicillin (oxacillin) resistant coagulase negative staphylococcus. Possible blood culture contaminant (unless isolated from more than one blood culture draw or clinical case suggests pathogenicity). No antibiotic treatment is indicated for blood  culture contaminants. CRITICAL RESULT CALLED TO, READ BACK BY AND VERIFIED WITHLavell Luster [facility] 8101 05/14/18 A BROWNING    Staphylococcus aureus (BCID) NOT DETECTED NOT DETECTED Final   Methicillin resistance DETECTED (A) NOT DETECTED Final    Comment: CRITICAL RESULT CALLED TO, READ BACK BY AND VERIFIED WITH: Lavell Luster [facility] 7510 05/14/18 A BROWNING    Streptococcus species NOT DETECTED NOT DETECTED Final   Streptococcus agalactiae NOT DETECTED NOT DETECTED Final   Streptococcus pneumoniae NOT DETECTED NOT DETECTED Final   Streptococcus pyogenes NOT DETECTED NOT DETECTED Final   Acinetobacter baumannii NOT DETECTED NOT DETECTED  Final   Enterobacteriaceae species NOT DETECTED NOT DETECTED Final   Enterobacter cloacae complex NOT DETECTED NOT DETECTED Final   Escherichia coli NOT DETECTED NOT DETECTED Final   Klebsiella oxytoca NOT DETECTED NOT DETECTED Final   Klebsiella pneumoniae NOT DETECTED NOT DETECTED Final   Proteus species NOT DETECTED NOT DETECTED Final   Serratia marcescens NOT DETECTED NOT DETECTED Final   Haemophilus influenzae NOT DETECTED NOT DETECTED Final   Neisseria meningitidis NOT DETECTED NOT DETECTED Final   Pseudomonas aeruginosa NOT DETECTED NOT DETECTED Final   Candida albicans NOT DETECTED NOT DETECTED Final   Candida glabrata NOT DETECTED NOT DETECTED Final   Candida krusei NOT DETECTED NOT DETECTED Final   Candida parapsilosis NOT DETECTED NOT DETECTED Final   Candida tropicalis NOT DETECTED NOT DETECTED Final    Comment: Performed at [facility], [place]. [address] [place], [place] [address]  Culture, blood (routine x 2)     Status: None   Collection Time: 05/12/18  9:29 PM  Result Value Ref Range Status   Specimen Description BLOOD RIGHT ANTECUBITAL  Final   Special Requests   Final    AEROBIC BOTTLE ONLY Blood Culture results may not be optimal due to an inadequate volume of blood received in culture bottles Performed at [facility], [place] [address], [place], [place] [address]    Culture NO GROWTH 5 DAYS  Final   Report Status 05/18/2018 FINAL  Final  MRSA PCR Screening     Status: Abnormal   Collection Time: 05/13/18  3:59 AM  Result Value Ref Range Status   MRSA by PCR POSITIVE (A) NEGATIVE Final    Comment:        The GeneXpert MRSA Assay (FDA approved for NASAL specimens only), is one component of a comprehensive MRSA colonization surveillance program. It is not intended to diagnose MRSA infection nor to guide or monitor treatment for MRSA infections. CRITICAL RESULT CALLED TO, READ BACK BY AND VERIFIED WITH: RN [facility] AT 2353 05/13/18  [place] A. Performed at [facility], [place] [address] [place], [place] [address]          Radiology Studies: Mr Liver W Wo Contrast  Result Date: 05/20/2018 CLINICAL DATA:  Hepatic mass suspected on recent noncontrast CT. EXAM: MRI ABDOMEN WITHOUT AND WITH CONTRAST TECHNIQUE: Multiplanar multisequence MR imaging of the abdomen was performed both before and after the  administration of intravenous contrast. CONTRAST:  10 mL Gadavist COMPARISON:  Noncontrast CT on 05/13/2018 FINDINGS: Lower chest: Moderate right and tiny left pleural effusions again noted. Hepatobiliary: A large heterogeneously enhancing soft tissue mass is seen in the central liver involving both the right and left lobes and liver hilum. This measures 11.3 x 8.3 cm. Portal and hepatic veins remain patent. There is mild peripheral intrahepatic biliary ductal dilatation seen in the right hepatic lobe, but there is no evidence of extrahepatic biliary ductal dilatation. Pancreas:  No mass or inflammatory changes. Spleen:  Within normal limits in size and appearance. Adrenals/Urinary Tract: Normal adrenal glands. The right kidney is severely atrophic and nonfunctioning. No left renal masses are identified, however severe left hydronephrosis is demonstrated as seen on previous CT. Stomach/Bowel: Visualized portion unremarkable. Vascular/Lymphatic: No pathologically enlarged lymph nodes identified. No abdominal aortic aneurysm. Other:  None. Musculoskeletal:  No suspicious bone lesions identified. IMPRESSION: 11.3 cm heterogeneously enhancing mass involving the central right and left lobes and liver hilum. Differential diagnosis includes intrahepatic cholangiocarcinoma, gallbladder carcinoma, hepatocellular carcinoma, or less likely liver metastasis. No other sites of malignancy identified within the abdomen. Severe left hydronephrosis as demonstrated on recent CAP CT. Moderate right pleural effusion and tiny left pleural  effusion. Electronically Signed   By: Earle Gell M.D.   On: 05/20/2018 16:11        Scheduled Meds: . amLODipine  5 mg Oral Daily  . bethanechol  25 mg Oral 3 times per day  . cholecalciferol  2,000 Units Oral Daily  . folic acid  1 mg Oral Daily  . latanoprost  1 drop Both Eyes QHS  . levothyroxine  137 mcg Oral Q0600  . pregabalin  150 mg Oral BID  . tamsulosin  0.4 mg Oral QHS  . trimethoprim-polymyxin b  1 drop Left Eye Q4H   Continuous Infusions:    LOS: 9 days     Bonnielee Haff, MD [facility] Pager on [facility]   05/22/2018, 12:37 PM

## 2018-05-22 NOTE — Care Management Important Message (Signed)
Important Message  Patient Details  Name: EMMETT BRACKNELL MRN: [id number] Date of Birth: Aug 30, 1930   Medicare Important Message Given:  Yes    Kerin Salen 05/22/2018, 11:18 AMImportant Message  Patient Details  Name: REYDEN SMITH MRN: [id number] Date of Birth: 07-18-1930   Medicare Important Message Given:  Yes    Kerin Salen 05/22/2018, 11:18 AM

## 2018-05-22 NOTE — Progress Notes (Signed)
  Speech Language Pathology Treatment:    Patient Details Name: Derek Hays MRN: [id number] DOB: 1930-06-16 Today's Date: 05/22/2018 Time: 0932-3557 SLP Time Calculation (min) (ACUTE ONLY): 39 min  Assessment / Plan / Recommendation Clinical Impression  Pt with much improved mentation/respiratory status today!  He is not dyspneic and voice is strong!  SLP observed pt with advancement in diet to dys3/thin (given poor dentition) and he tolerated well with self feeding and extra time to clear oral residuals *initially with moderate cues to clear but easily able to generalize during session.  Will definitely advance diet to maximize pt's intake given significant progression.  [place] and posted signs for pt. SLP will follow up briefly to assure tolerance given his recurrent aspiration pnas.     HPI HPI: pt is 85 yo male adm to [facility] with respiratory difficulties.  PMH + for CVA, recurrent pnas with recent hospital admit, Afib and h/o bladder cancer.  MBS ordered as pt has h/o dysphagia.        SLP Plan  Continue with current plan of care       Recommendations  Liquids provided via: Straw;Cup Medication Administration: Whole meds with puree Supervision: Patient able to self feed(needs set up due to left sided paralysis) Compensations: Slow rate;Small sips/bites;Lingual sweep for clearance of pocketing;Minimize environmental distractions(oral suction before and after meals) Postural Changes and/or Swallow Maneuvers: Seated upright 90 degrees;Upright 30-60 min after meal                Oral Care Recommendations: Oral care BID Follow up Recommendations: None SLP Visit Diagnosis: Dysphagia, oropharyngeal phase (R13.12) Plan: Continue with current plan of care       GO                Derek Hays 05/22/2018, 3:42 PM Derek Hays, [facility] SLP [place] Pager [phone] Office [phone]

## 2018-05-22 NOTE — Consult Note (Signed)
Chief Complaint: Patient was seen in consultation today for liver mass.  Referring Physician(s): Bonnielee Haff  Supervising Physician: Arne Cleveland  Patient Status: [name] - In-pt  History of Present Illness: Derek Hays is a 85 y.o. male with a past medical history of hypertension, atrial fibrillation on long term anticoagulation with Eliquis, diastolic HF, CVA 07/1285, bladder cancer, and hypothyroidism. He presented to [facility] ED 05/12/2018 with complaints of cough and fatigue. He was found to have pneumonia and was admitted for management. Upon work-up, he had an incidental finding of a liver mass.  CT chest 05/13/2018: 1. Lung opacities are best attributed atelectasis. No definite pneumonia. There is generalized airway thickening and a chronic right pleural effusion that has increased from December 2019, now moderate to large. 2. Ill-defined low-density in the central liver worrisome for infiltrating mass. Depending on GFR trend and ability to breath hold, enhanced abdominal CT or liver MRI is recommended. 3. Severely atrophic and nonfunctional right kidney. There is chronic but progressive left hydroureteronephrosis above a bladder showing signs of chronic outlet obstruction. 4. Multiple additional chronic findings described above.  Derek liver 05/20/2018: 1. 11.3 cm heterogeneously enhancing mass involving the central right and left lobes and liver hilum. Differential diagnosis includes intrahepatic cholangiocarcinoma, gallbladder carcinoma, hepatocellular carcinoma, or less likely liver metastasis. 2. No other sites of malignancy identified within the abdomen. 3. Severe left hydronephrosis as demonstrated on recent CAP CT. 4. Moderate right pleural effusion and tiny left pleural effusion.  IR requested by Dr. Maryland Pink for possible image-guided liver mass biopsy. Patient awake and alert laying in bed with no complaints at this time. Accompanied by wife at bedside. Denies fever,  chills, chest pain, dyspnea, abdominal pain, dizziness, or headache.  Patient is currently taking Eliquis 5 mg twice daily- last dose 05/21/2018 at 1100.   Past Medical History:  Diagnosis Date  . Atrial fibrillation ([place])   . Bladder cancer ([place]) 1984  . Hypertension   . Stroke [facility])     Past Surgical History:  Procedure Laterality Date  . IR CT HEAD LTD  05/27/2017  . IR PERCUTANEOUS ART THROMBECTOMY/INFUSION INTRACRANIAL INC DIAG ANGIO  05/27/2017  . PACEMAKER INSERTION    . RADIOLOGY WITH ANESTHESIA N/A 05/27/2017   Procedure: RADIOLOGY WITH ANESTHESIA;  Surgeon: Luanne Bras, MD;  Location: [place];  Service: Radiology;  Laterality: N/A;    Allergies: Patient has no known allergies.  Medications: Prior to Admission medications   Medication Sig Start Date End Date Taking? Authorizing Provider  albuterol (PROVENTIL) (2.5 MG/3ML) 0.083% nebulizer solution Take 3 mLs (2.5 mg total) by nebulization every 6 (six) hours. Patient taking differently: Take 2.5 mg by nebulization every 6 (six) hours as needed for wheezing or shortness of breath.  08/13/17  Yes Rory Percy, DO  apixaban (ELIQUIS) 5 MG TABS tablet Take 1 tablet (5 mg total) by mouth 2 (two) times daily. 06/19/17  Yes Angiulli, Lavon Paganini, PA-C  benzonatate (TESSALON) 100 MG capsule Take 100 mg by mouth 3 (three) times daily as needed for cough.   Yes [provider]  bethanechol (URECHOLINE) 25 MG tablet Take 1 tablet (25 mg total) by mouth 3 (three) times daily. Patient taking differently: Take 25 mg by mouth 3 (three) times daily. 7:30am, 5pm, 9pm 06/19/17  Yes Angiulli, Lavon Paganini, PA-C  Cholecalciferol (VITAMIN D) 50 MCG (2000 UT) tablet Take 2,000 Units by mouth daily.   Yes [provider]  coal tar (NEUTROGENA T-GEL) 0.5 % shampoo Apply 1  application topically daily as needed (dandruff).   Yes [provider]  folic acid (FOLVITE) 1 MG tablet Take 1 mg by mouth daily.   Yes [provider]  furosemide (LASIX) 20 MG tablet Take 20 mg by mouth daily as needed for fluid.   Yes [provider]  latanoprost (XALATAN) 0.005 % ophthalmic solution Place 1 drop into the right eye at bedtime.    Yes [provider]  levothyroxine (SYNTHROID, LEVOTHROID) 137 MCG tablet Take 137 mcg by mouth daily before breakfast.   Yes [provider]  Multiple Vitamins-Minerals (PRESERVISION AREDS 2) CAPS Take 1 capsule by mouth 2 (two) times daily.   Yes [provider]  pregabalin (LYRICA) 200 MG capsule Take 200 mg by mouth 2 (two) times daily.   Yes [provider]  tamsulosin (FLOMAX) 0.4 MG CAPS capsule Take 1 capsule (0.4 mg total) by mouth daily after supper. Patient taking differently: Take 0.4 mg by mouth at bedtime.  06/19/17  Yes Angiulli, Lavon Paganini, PA-C  timolol (TIMOPTIC-XR) 0.5 % ophthalmic gel-forming Place 1 drop into the left eye at bedtime.   Yes [provider]  traZODone (DESYREL) 100 MG tablet Take 200 mg by mouth at bedtime.   Yes [provider]  amLODipine (NORVASC) 10 MG tablet Take 1 tablet (10 mg total) by mouth daily. Patient not taking: Reported on 05/13/2018 08/14/17   Rory Percy, DO  atorvastatin (LIPITOR) 40 MG tablet Take 1 tablet (40 mg total) by mouth daily at 6 PM. Patient not taking: Reported on 05/13/2018 08/13/17   Rory Percy, DO     Family History  Problem Relation Age of Onset  . Emphysema Father     Social History   Socioeconomic History  . Marital status: Married    Spouse name: Not on file  . Number of children: Not on file  . Years of education: Not on file  . Highest education level: Not on file  Occupational History    Employer: [employer]  Social Needs  . Financial resource strain: Not on file  . Food insecurity:    Worry: Not on file    Inability: Not on file  . Transportation needs:    Medical: Not on file    Non-medical: Not on file  Tobacco Use  . Smoking  status: Former Smoker    Packs/day: 1.50    Years: 25.00    Pack years: 37.50    Types: Cigarettes    Last attempt to quit: 06/12/1982    Years since quitting: 35.9  . Smokeless tobacco: Never Used  . Tobacco comment: pt states he quit when dx with bladder ca  Substance and Sexual Activity  . Alcohol use: Not Currently  . Drug use: No  . Sexual activity: Not on file  Lifestyle  . Physical activity:    Days per week: Not on file    Minutes per session: Not on file  . Stress: To some extent  Relationships  . Social connections:    Talks on phone: More than three times a week    Gets together: Twice a week    Attends religious service: Not on file    Active member of club or organization: Not on file    Attends meetings of clubs or organizations: Never    Relationship status: Not on file  Other Topics Concern  . Not on file  Social History Narrative  . Not on file     Review  of Systems: A 12 point ROS discussed and pertinent positives are indicated in the HPI above.  All other systems are negative.  Review of Systems  Constitutional: Negative for chills and fever.  Respiratory: Negative for shortness of breath and wheezing.   Cardiovascular: Negative for chest pain and palpitations.  Gastrointestinal: Negative for abdominal pain.  Neurological: Negative for dizziness and headaches.  Psychiatric/Behavioral: Negative for behavioral problems and confusion.    Vital Signs: BP (!) 177/75 (BP Location: Right Arm)   Pulse 60   Temp (!) 97.5 F (36.4 C) (Oral)   Resp 17   Ht 6' (1.829 m)   Wt 197 lb 12 oz (89.7 kg)   SpO2 99%   BMI 26.82 kg/m   Physical Exam Vitals signs and nursing note reviewed.  Constitutional:      General: He is not in acute distress.    Appearance: Normal appearance.  Cardiovascular:     Rate and Rhythm: Normal rate and regular rhythm.     Heart sounds: Normal heart sounds. No murmur.  Pulmonary:     Effort: Pulmonary effort is normal. No  respiratory distress.     Breath sounds: Normal breath sounds. No wheezing.  Skin:    General: Skin is warm and dry.  Neurological:     Mental Status: He is alert and oriented to person, place, and time.  Psychiatric:        Mood and Affect: Mood normal.        Behavior: Behavior normal.        Thought Content: Thought content normal.        Judgment: Judgment normal.      MD Evaluation Airway: WNL Heart: WNL Abdomen: WNL Chest/ Lungs: WNL ASA  Classification: 3 Mallampati/Airway Score: Two   Imaging: Dg Chest 2 View  Result Date: 05/12/2018 CLINICAL DATA:  Productive cough. EXAM: CHEST - 2 VIEW COMPARISON:  Chest x-ray dated March 31, 2018. FINDINGS: Unchanged left chest wall pacemaker. Stable cardiomediastinal silhouette. Normal pulmonary vascularity. Patchy opacity in the right middle lobe. Unchanged small right pleural effusion. The left lung is clear. No pneumothorax. No acute osseous abnormality. IMPRESSION: 1. Right middle lobe pneumonia. 2. Unchanged small right pleural effusion. Electronically Signed   By: Titus Dubin M.D.   On: 05/12/2018 20:45   Ct Chest Wo Contrast  Result Date: 05/13/2018 CLINICAL DATA:  Pneumonia, unresolved for complicated EXAM: CT CHEST, ABDOMEN AND PELVIS WITHOUT CONTRAST TECHNIQUE: Multidetector CT imaging of the chest, abdomen and pelvis was performed following the standard protocol without IV contrast. COMPARISON:  Chest x-ray from yesterday FINDINGS: CT CHEST FINDINGS Cardiovascular: Normal heart size. Small pericardial effusion that is low-density and chronic. Dual-chamber pacer leads from the left. No acute vascular finding without contrast. Extensive atherosclerotic calcification. Mediastinum/Nodes: Negative for adenopathy or mass. Lungs/Pleura: Moderate to large layering right pleural effusion with lower lobe atelectasis. Streaky opacities on both sides is most consistent with atelectasis. Airway thickening that is generalized.  Musculoskeletal: Spondylosis with multi-level ankylosis. CT ABDOMEN PELVIS FINDINGS Hepatobiliary: Ill-defined low-density in the central liver, worrisome for infiltrating mass.Cholecystectomy with no bile duct dilatation. Pancreas: Unremarkable. Spleen: Generalized atrophy Adrenals/Urinary Tract: Negative adrenals. Severe right renal atrophy. Chronic left hydroureteronephrosis above a dilated lobulated bladder suggestive of chronic outlet obstruction. Stomach/Bowel: No obstruction. No evident inflammation. Extensive colonic diverticulosis. Vascular/Lymphatic: Diffuse atherosclerotic calcification. Focal displacement of intimal calcification in the infrarenal aorta, chronic. No mass or adenopathy. Reproductive:Question prostatectomy. Other: No ascites or pneumoperitoneum. Musculoskeletal: Lumbar fusion and laminectomy with  L2-L5 solid arthrodesis. No acute or aggressive finding. IMPRESSION: 1. Lung opacities are best attributed atelectasis. No definite pneumonia. There is generalized airway thickening and a chronic right pleural effusion that has increased from December 2019, now moderate to large. 2. Ill-defined low-density in the central liver worrisome for infiltrating mass. Depending on GFR trend and ability to breath hold, enhanced abdominal CT or liver MRI is recommended. 3. Severely atrophic and nonfunctional right kidney. There is chronic but progressive left hydroureteronephrosis above a bladder showing signs of chronic outlet obstruction. 4. Multiple additional chronic findings described above. Electronically Signed   By: Monte Fantasia M.D.   On: 05/13/2018 07:55   Derek Liver W Wo Contrast  Result Date: 05/20/2018 CLINICAL DATA:  Hepatic mass suspected on recent noncontrast CT. EXAM: MRI ABDOMEN WITHOUT AND WITH CONTRAST TECHNIQUE: Multiplanar multisequence Derek imaging of the abdomen was performed both before and after the administration of intravenous contrast. CONTRAST:  10 mL Gadavist COMPARISON:   Noncontrast CT on 05/13/2018 FINDINGS: Lower chest: Moderate right and tiny left pleural effusions again noted. Hepatobiliary: A large heterogeneously enhancing soft tissue mass is seen in the central liver involving both the right and left lobes and liver hilum. This measures 11.3 x 8.3 cm. Portal and hepatic veins remain patent. There is mild peripheral intrahepatic biliary ductal dilatation seen in the right hepatic lobe, but there is no evidence of extrahepatic biliary ductal dilatation. Pancreas:  No mass or inflammatory changes. Spleen:  Within normal limits in size and appearance. Adrenals/Urinary Tract: Normal adrenal glands. The right kidney is severely atrophic and nonfunctioning. No left renal masses are identified, however severe left hydronephrosis is demonstrated as seen on previous CT. Stomach/Bowel: Visualized portion unremarkable. Vascular/Lymphatic: No pathologically enlarged lymph nodes identified. No abdominal aortic aneurysm. Other:  None. Musculoskeletal:  No suspicious bone lesions identified. IMPRESSION: 11.3 cm heterogeneously enhancing mass involving the central right and left lobes and liver hilum. Differential diagnosis includes intrahepatic cholangiocarcinoma, gallbladder carcinoma, hepatocellular carcinoma, or less likely liver metastasis. No other sites of malignancy identified within the abdomen. Severe left hydronephrosis as demonstrated on recent CAP CT. Moderate right pleural effusion and tiny left pleural effusion. Electronically Signed   By: Earle Gell M.D.   On: 05/20/2018 16:11   Dg Swallowing Func-speech Pathology  Result Date: 05/19/2018 Objective Swallowing Evaluation: Type of Study: MBS-Modified Barium Swallow Study  Patient Details Name: Derek Hays MRN: [id number] Date of Birth: 03/21/1931 Today's Date: 05/19/2018 Time: SLP Start Time (ACUTE ONLY): 1020 -SLP Stop Time (ACUTE ONLY): 1040 SLP Time Calculation (min) (ACUTE ONLY): 20 min Past Medical History: Past  Medical History: Diagnosis Date . Atrial fibrillation ([place])  . Bladder cancer ([place]) 1984 . Hypertension  . Stroke [facility])  Past Surgical History: Past Surgical History: Procedure Laterality Date . IR CT HEAD LTD  05/27/2017 . IR PERCUTANEOUS ART THROMBECTOMY/INFUSION INTRACRANIAL INC DIAG ANGIO  05/27/2017 . PACEMAKER INSERTION   . RADIOLOGY WITH ANESTHESIA N/A 05/27/2017  Procedure: RADIOLOGY WITH ANESTHESIA;  Surgeon: Luanne Bras, MD;  Location: [place];  Service: Radiology;  Laterality: N/A; HPI: pt is 85 yo male adm to [facility] with respiratory difficulties.  PMH + for CVA, recurrent pnas with recent hospital admit, Afib and h/o bladder cancer.  MBS ordered as pt has h/o dysphagia.   Subjective: Pt resting in bed. No family present. Assessment / Plan / Recommendation CHL IP CLINICAL IMPRESSIONS 05/15/2018 Clinical Impression  Patient presents with moderate oropharyngeal dysphagia - oral deficits c/b decreased lingual control, lingual pumping,  premature spillage of boluses and residuals that pt senses only if not in left buccal region.  SILENT spiration x2 occured during testing - once with nectar as small portion of bolus spilled into open airway before swallow triggered.  Aspiration of suspected TRACE puree pharyngeal residuals (mixed with secretions) also noted just below vocal folds.  Of note, pt did cough once reflexively during MBS, likely due to aspirates reaching carina.  Pt's head in natural chin tuck position which increased oral deficits due to gravity.  Cued dry swallows helpful to decrease oropharyngeal residuals.  Expectoration of pudding residuals from left lateral sulci achieved after completion of study.  As pt reports more problems coughing with solids than liquids and oral deficits significant, at this time recommend consider full liquid for now and advancing as medically improved.  Pt educated to findings/recommendations and SLP will follow.  In addition, esophageal clearance appeared impaired with  suspected tertiary contractions and lack of pt sensation.  Given pt advanced age, stroke related chronic dysphagia/aspiration and recurrent admits with imaging showing concerns for pna - highly recommend palliative consult for Derek Hays.   Swallow Evaluation Recommendations      SLP Diet Recommendations: Thin liquid;Nectar thick liquid(full liquids)   Liquid Administration via: Straw   Medication Administration: Crushed with puree   Supervision: Staff to assist with self feeding;Full supervision/cueing for compensatory strategies   Compensations: Slow rate;Small sips/bites;Multiple dry swallows after each bite/sip;Follow solids with liquid;Other (Comment);Lingual sweep for clearance of pocketing;Minimize environmental distractions(intermittently clear throat/cough)   Postural Changes: Remain semi-upright after after feeds/meals (Comment);Seated upright at 90 degrees   Oral Care Recommendations: Oral care BID   Other Recommendations: Have oral suction available  SLP Visit Diagnosis Dysphagia, oropharyngeal phase (R13.12);Dysphagia, pharyngoesophageal phase (R13.14) Attention and concentration deficit following -- Frontal lobe and executive function deficit following -- Impact on safety and function --   CHL IP TREATMENT RECOMMENDATION 05/14/2018 Treatment Recommendations Therapy as outlined in treatment plan below   Prognosis 05/14/2018 Prognosis for Safe Diet Advancement Guarded Barriers to Reach Goals Severity of deficits Barriers/Prognosis Comment -- CHL IP DIET RECOMMENDATION 05/15/2018 SLP Diet Recommendations -- Liquid Administration via -- Medication Administration -- Compensations Slow rate;Small sips/bites;Multiple dry swallows after each bite/sip;Follow solids with liquid;Other (Comment);Lingual sweep for clearance of pocketing;Minimize environmental distractions Postural Changes --   CHL IP OTHER RECOMMENDATIONS 05/14/2018 Recommended Consults -- Oral Care Recommendations Oral care BID Other  Recommendations Have oral suction available   CHL IP FOLLOW UP RECOMMENDATIONS 05/15/2018 Follow up Recommendations Skilled Nursing facility   [facility] IP FREQUENCY AND DURATION 05/14/2018 Speech Therapy Frequency (ACUTE ONLY) min 1 x/week Treatment Duration 1 week      CHL IP ORAL PHASE 05/14/2018 Oral Phase Impaired Oral - Pudding Teaspoon -- Oral - Pudding Cup -- Oral - Honey Teaspoon -- Oral - Honey Cup -- Oral - Nectar Teaspoon NT Oral - Nectar Cup -- Oral - Nectar Straw Weak lingual manipulation;Lingual pumping;Lingual/palatal residue;Delayed oral transit;Decreased bolus cohesion;Premature spillage Oral - Thin Teaspoon Premature spillage;Decreased bolus cohesion;Weak lingual manipulation;Reduced posterior propulsion;Lingual pumping;Delayed oral transit Oral - Thin Cup Delayed oral transit;Premature spillage;Decreased bolus cohesion;Weak lingual manipulation;Lingual pumping Oral - Thin Straw Decreased bolus cohesion;Premature spillage;Delayed oral transit;Weak lingual manipulation;Lingual pumping Oral - Puree Weak lingual manipulation;Lingual/palatal residue;Left pocketing in lateral sulci;Lingual pumping;Premature spillage Oral - Mech Soft Lingual/palatal residue;Left pocketing in lateral sulci;Lingual pumping;Weak lingual manipulation;Premature spillage;Impaired mastication;Delayed oral transit Oral - Regular -- Oral - Multi-Consistency -- Oral - Pill -- Oral Phase - Comment cued dry swallows, expectoration  of residual pudding  CHL IP PHARYNGEAL PHASE 05/14/2018 Pharyngeal Phase Impaired Pharyngeal- Pudding Teaspoon -- Pharyngeal -- Pharyngeal- Pudding Cup -- Pharyngeal -- Pharyngeal- Honey Teaspoon -- Pharyngeal -- Pharyngeal- Honey Cup -- Pharyngeal -- Pharyngeal- Nectar Teaspoon -- Pharyngeal -- Pharyngeal- Nectar Cup -- Pharyngeal -- Pharyngeal- Nectar Straw Penetration/Aspiration before swallow;Reduced epiglottic inversion;Reduced laryngeal elevation;Reduced airway/laryngeal closure;Reduced tongue base retraction  Pharyngeal Material enters airway, passes BELOW cords without attempt by patient to eject out (silent aspiration) Pharyngeal- Thin Teaspoon Pharyngeal residue - valleculae;Pharyngeal residue - pyriform Pharyngeal -- Pharyngeal- Thin Cup Reduced epiglottic inversion;Reduced anterior laryngeal mobility;Reduced laryngeal elevation;Reduced airway/laryngeal closure;Reduced tongue base retraction;Penetration/Aspiration before swallow;Pharyngeal residue - valleculae;Pharyngeal residue - pyriform Pharyngeal Material enters airway, CONTACTS cords and then ejected out Pharyngeal- Thin Straw Reduced epiglottic inversion;Reduced laryngeal elevation;Reduced tongue base retraction;Reduced airway/laryngeal closure;Penetration/Aspiration before swallow;Pharyngeal residue - valleculae;Pharyngeal residue - pyriform Pharyngeal Material enters airway, CONTACTS cords and not ejected out Pharyngeal- Puree Reduced pharyngeal peristalsis;Reduced epiglottic inversion;Reduced anterior laryngeal mobility;Reduced laryngeal elevation;Reduced airway/laryngeal closure;Reduced tongue base retraction;Pharyngeal residue - valleculae;Pharyngeal residue - pyriform;Compensatory strategies attempted (with notebox);Delayed swallow initiation-vallecula Pharyngeal -- Pharyngeal- Mechanical Soft Delayed swallow initiation-vallecula;Reduced epiglottic inversion;Reduced tongue base retraction;Pharyngeal residue - valleculae;Pharyngeal residue - pyriform Pharyngeal -- Pharyngeal- Regular -- Pharyngeal -- Pharyngeal- Multi-consistency -- Pharyngeal -- Pharyngeal- Pill -- Pharyngeal -- Pharyngeal Comment cued dry swallow helpful to decrease residuals, liquid swallow helpful to decrease residuals but also resulted in minimal laryngeal penetration, ? aspiration of puree residual after the swallow due to residue spilling into open airway (when flouro was turned off)  CHL IP CERVICAL ESOPHAGEAL PHASE 05/14/2018 Cervical Esophageal Phase Impaired Pudding Teaspoon --  Pudding Cup -- Honey Teaspoon -- Honey Cup -- Nectar Teaspoon -- Nectar Cup -- Nectar Straw -- Thin Teaspoon -- Thin Cup -- Thin Straw -- Puree -- Mechanical Soft -- Regular -- Multi-consistency -- Pill -- Cervical Esophageal Comment residuals noted at pyriform sinus = decreased laryngeal closure can contribute to this  Macario Golds 05/19/2018, 12:54 PM Luanna Salk, MS [place] Pager [phone] Office [phone] Test conducted 05/14/2018             Ct Renal Stone Study  Result Date: 05/13/2018 CLINICAL DATA:  Pneumonia, unresolved for complicated EXAM: CT CHEST, ABDOMEN AND PELVIS WITHOUT CONTRAST TECHNIQUE: Multidetector CT imaging of the chest, abdomen and pelvis was performed following the standard protocol without IV contrast. COMPARISON:  Chest x-ray from yesterday FINDINGS: CT CHEST FINDINGS Cardiovascular: Normal heart size. Small pericardial effusion that is low-density and chronic. Dual-chamber pacer leads from the left. No acute vascular finding without contrast. Extensive atherosclerotic calcification. Mediastinum/Nodes: Negative for adenopathy or mass. Lungs/Pleura: Moderate to large layering right pleural effusion with lower lobe atelectasis. Streaky opacities on both sides is most consistent with atelectasis. Airway thickening that is generalized. Musculoskeletal: Spondylosis with multi-level ankylosis. CT ABDOMEN PELVIS FINDINGS Hepatobiliary: Ill-defined low-density in the central liver, worrisome for infiltrating mass.Cholecystectomy with no bile duct dilatation. Pancreas: Unremarkable. Spleen: Generalized atrophy Adrenals/Urinary Tract: Negative adrenals. Severe right renal atrophy. Chronic left hydroureteronephrosis above a dilated lobulated bladder suggestive of chronic outlet obstruction. Stomach/Bowel: No obstruction. No evident inflammation. Extensive colonic diverticulosis. Vascular/Lymphatic: Diffuse atherosclerotic calcification. Focal displacement of  intimal calcification in the infrarenal aorta, chronic. No mass or adenopathy. Reproductive:Question prostatectomy. Other: No ascites or pneumoperitoneum. Musculoskeletal: Lumbar fusion and laminectomy with L2-L5 solid arthrodesis. No acute or aggressive finding. IMPRESSION: 1. Lung opacities are best attributed atelectasis. No definite pneumonia. There is generalized airway thickening and a chronic right pleural  effusion that has increased from December 2019, now moderate to large. 2. Ill-defined low-density in the central liver worrisome for infiltrating mass. Depending on GFR trend and ability to breath hold, enhanced abdominal CT or liver MRI is recommended. 3. Severely atrophic and nonfunctional right kidney. There is chronic but progressive left hydroureteronephrosis above a bladder showing signs of chronic outlet obstruction. 4. Multiple additional chronic findings described above. Electronically Signed   By: Monte Fantasia M.D.   On: 05/13/2018 07:55    Labs:  CBC: Recent Labs    05/17/18 0605 05/20/18 0633 05/21/18 0629 05/22/18 0558  WBC 7.8 8.2 7.4 8.2  HGB 12.8* 12.4* 12.5* 13.7  HCT 40.8 39.0 39.4 43.9  PLT 165 126* 122* 149*    COAGS: Recent Labs    05/27/17 1730 08/10/17 1107 11/05/17 1945 11/06/17 0509 05/22/18 0558  INR 1.01 1.69 1.80 1.81 1.45  APTT 30  --  36 36  --     BMP: Recent Labs    05/16/18 0626 05/17/18 0605 05/20/18 0633 05/21/18 0629 05/22/18 0558  NA 145 144  --  144 145  K 3.5 3.5  --  3.9 3.9  CL 113* 111  --  112* 111  CO2 25 24  --  22 24  GLUCOSE 113* 98  --  76 77  BUN 23 20  --  19 21  CALCIUM 8.9 9.0  --  9.2 9.5  CREATININE 1.39* 1.30* 1.20 1.18 1.23  GFRNONAA 46* 50* 55* 56* 53*  GFRAA 53* 58* >60 >60 >60    LIVER FUNCTION TESTS: Recent Labs    11/06/17 0509 03/31/18 1303 05/12/18 2100 05/22/18 0558  BILITOT 0.9 0.9 1.0 1.6*  AST 14* 25 35 35  ALT 9 18 19 19   ALKPHOS 51 165* 284* 244*  PROT 4.4* 6.7 6.7 6.7    ALBUMIN 2.2* 3.1* 3.2* 3.1*    Assessment and Plan:  Liver mass. Plan for image-guided liver mass biopsy tentatively for 05/23/2018 with Dr. Laurence Ferrari. Patient will be NPO at midnight prior to procedure. Afebrile and WBCs WNL. Patient is currently taking Eliquis 5 mg twice daily, last dose 05/21/2018 at 1100- ok to proceed 05/23/2018 after 1100 per IR protocol. INR 1.45 seconds this AM.  Consent obtained from patient's wife/POA, Leary Roca.  Risks and benefits discussed with the patient including, but not limited to bleeding, infection, damage to adjacent structures or low yield requiring additional tests. All of the patient's questions were answered, patient is agreeable to proceed. Consent signed and in chart.   Thank you for this interesting consult.  I greatly enjoyed meeting Derek Hays and look forward to participating in their care.  A copy of this report was sent to the requesting provider on this date.  Electronically Signed: Earley Abide, PA-C 05/22/2018, 12:38 PM   I spent a total of 55 Miinutes in face to face in clinical consultation, greater than 50% of which was counseling/coordinating care for liver mass.

## 2018-05-23 ENCOUNTER — Inpatient Hospital Stay (HOSPITAL_COMMUNITY): Payer: Medicare Other

## 2018-05-23 DIAGNOSIS — R160 Hepatomegaly, not elsewhere classified: Secondary | ICD-10-CM

## 2018-05-23 LAB — COMPREHENSIVE METABOLIC PANEL
ALT: 19 U/L (ref 0–44)
AST: 38 U/L (ref 15–41)
Albumin: 2.9 g/dL — ABNORMAL LOW (ref 3.5–5.0)
Alkaline Phosphatase: 221 U/L — ABNORMAL HIGH (ref 38–126)
Anion gap: 9 (ref 5–15)
BUN: 20 mg/dL (ref 8–23)
CO2: 25 mmol/L (ref 22–32)
Calcium: 9.1 mg/dL (ref 8.9–10.3)
Chloride: 108 mmol/L (ref 98–111)
Creatinine, Ser: 1.19 mg/dL (ref 0.61–1.24)
GFR calc non Af Amer: 55 mL/min — ABNORMAL LOW (ref >60)
Glucose, Bld: 106 mg/dL — ABNORMAL HIGH (ref 70–99)
Potassium: 3.7 mmol/L (ref 3.5–5.1)
Sodium: 142 mmol/L (ref 135–145)
Total Bilirubin: 0.8 mg/dL (ref 0.3–1.2)
Total Protein: 6.2 g/dL — ABNORMAL LOW (ref 6.5–8.1)

## 2018-05-23 LAB — CBC
HCT: 41.4 % (ref 39.0–52.0)
Hemoglobin: 13 g/dL (ref 13.0–17.0)
MCH: 28.6 pg (ref 26.0–34.0)
MCHC: 31.4 g/dL (ref 30.0–36.0)
MCV: 91 fL (ref 80.0–100.0)
Platelets: 130 10*3/uL — ABNORMAL LOW (ref 150–400)
RBC: 4.55 MIL/uL (ref 4.22–5.81)
RDW: 15.5 % (ref 11.5–15.5)
WBC: 6.4 10*3/uL (ref 4.0–10.5)
nRBC: 0 % (ref 0.0–0.2)

## 2018-05-23 MED ORDER — FENTANYL CITRATE (PF) 100 MCG/2ML IJ SOLN
INTRAMUSCULAR | Status: AC
  Filled 2018-05-23: qty 2

## 2018-05-23 MED ORDER — LIDOCAINE HCL 1 % IJ SOLN
INTRAMUSCULAR | Status: AC
  Filled 2018-05-23: qty 20

## 2018-05-23 MED ORDER — LIDOCAINE HCL (PF) 1 % IJ SOLN
INTRAMUSCULAR | Status: AC | PRN
  Administered 2018-05-23: 10 mL

## 2018-05-23 MED ORDER — MIDAZOLAM HCL 2 MG/2ML IJ SOLN
INTRAMUSCULAR | Status: AC | PRN
  Administered 2018-05-23: 0.5 mg via INTRAVENOUS

## 2018-05-23 MED ORDER — GELATIN ABSORBABLE 12-7 MM EX MISC
CUTANEOUS | Status: AC
  Filled 2018-05-23: qty 1

## 2018-05-23 MED ORDER — FENTANYL CITRATE (PF) 100 MCG/2ML IJ SOLN
INTRAMUSCULAR | Status: AC | PRN
  Administered 2018-05-23: 25 ug via INTRAVENOUS

## 2018-05-23 MED ORDER — MIDAZOLAM HCL 2 MG/2ML IJ SOLN
INTRAMUSCULAR | Status: AC
  Filled 2018-05-23: qty 2

## 2018-05-23 NOTE — Plan of Care (Signed)
Patient in bed sleeping this morning. Arouses to loud voice and responds appropriately to questions. No complaints of pain currently. Will continue to monitor.

## 2018-05-23 NOTE — Progress Notes (Signed)
PT Cancellation Note  Patient Details Name: Derek Hays MRN: [id number] DOB: 1930-11-28   Cancelled Treatment:    Reason Eval/Treat Not Completed: Patient at procedure or test/unavailable   Salena Ortlieb,KATHrine E 05/23/2018, 2:33 PM Carmelia Bake, PT, DPT Acute Rehabilitation Services Office: [phone] Pager: [phone]

## 2018-05-23 NOTE — Progress Notes (Signed)
Daily Progress Note   Patient Name: Derek Hays       Date: 05/23/2018 DOB: 05-17-1930  Age: 85 y.o. MRN#: [id number] Attending Physician: Bonnielee Haff, MD Primary Care Physician: [facility] Admit Date: 05/12/2018  Reason for Consultation/Follow-up: Establishing goals of care  Subjective:  Derek Hays is resting in bed. No family at bedside, currently NPO for liver biopsy to be done today, he is hard of hearing, does not appear to be in distress   Length of Stay: 10  Current Medications: Scheduled Meds:  . amLODipine  5 mg Oral Daily  . bethanechol  25 mg Oral 3 times per day  . cholecalciferol  2,000 Units Oral Daily  . fentaNYL      . folic acid  1 mg Oral Daily  . gelatin adsorbable      . latanoprost  1 drop Both Eyes QHS  . levothyroxine  137 mcg Oral Q0600  . lidocaine      . midazolam      . pregabalin  150 mg Oral BID  . tamsulosin  0.4 mg Oral QHS  . trimethoprim-polymyxin b  1 drop Left Eye Q4H    Continuous Infusions:   PRN Meds: acetaminophen **OR** acetaminophen, albuterol, hydrALAZINE, ondansetron **OR** ondansetron (ZOFRAN) IV, oxyCODONE, polyethylene glycol, polyvinyl alcohol  Physical Exam        General: elderly male resting in bed Lungs: CTAB Cardiac: regular rate and rhythm Extremities: mild edema in lower extremities   Vital Signs: BP 140/81 (BP Location: Right Arm)   Pulse 60   Temp (!) 97.4 F (36.3 C)   Resp 16   Ht 6' (1.829 m)   Wt 92 kg   SpO2 93%   BMI 27.51 kg/m  SpO2: SpO2: 93 % O2 Device: O2 Device: Room Air O2 Flow Rate: O2 Flow Rate (L/min): 2 L/min  Intake/output summary:   Intake/Output Summary (Last 24 hours) at 05/23/2018 1551 Last data filed at 05/23/2018 1007 Gross per 24 hour  Intake 120 ml  Output  225 ml  Net -105 ml   LBM: Last BM Date: 05/16/18 Baseline Weight: Weight: 87.1 kg Most recent weight: Weight: 92 kg       Palliative Assessment/Data:    Flowsheet Rows     Most Recent Value  Intake Tab  Referral Department  Hospitalist  Unit at Time of Referral  Med/Surg Unit  Palliative Care Primary Diagnosis  Cancer  Date Notified  05/15/18  Palliative Care Type  Return patient Palliative Care  Reason for referral  Clarify Goals of Care  Date of Admission  05/12/18  Date first seen by Palliative Care  05/16/18  # of days Palliative referral response time  1 Day(s)  # of days IP prior to Palliative referral  3  Clinical Assessment  Psychosocial & Spiritual Assessment  Palliative Care Outcomes      Patient Active Problem List   Diagnosis Date Noted  . Goals of care, counseling/discussion   . Palliative care encounter   . Pneumonia 05/12/2018  . Urinary retention 04/02/2018  . CAP (community acquired pneumonia) 1September 26, 202019  . Permanent atrial fibrillation   . SIRS (systemic inflammatory response syndrome) ([place]) 11/05/2017  . Urinary tract infection 09/23/2017  . Acute respiratory failure with hypoxia ([place]) 09/23/2017  . Oropharyngeal dysphagia 09/23/2017  . Palliative care by specialist   . Sepsis due to urinary tract infection ([place])   . HCAP (healthcare-associated pneumonia)   . Altered mental status 08/10/2017  . Acute blood loss anemia   . Stage 3 chronic kidney disease ([place])   . PAF (paroxysmal atrial fibrillation) ([place])   . Benign essential HTN   . Dysphagia, post-stroke   . 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 Stroke (cerebrum) ([place]) 05/27/2017    Palliative Care Assessment & Plan   Patient Profile: 85 y.o. male  with past medical history of CVA with left hemiparesis, hypertension, diastolic heart failure, atrial fibrillation on Eliquis, and bladder cancer who was admitted on  05/12/2018 with pneumonia. Patient presented to ED for worsening productive cough with associated nausea, vomiting, and poor appetite.   Recommendations/Plan:  Continue current scope treatment.    Would continue oxycodone 2.5mg  Q6hours as needed.  Liver biopsy to be done today.   Patient to follow up at the [facility] after discharge. Patient to go home with home health on discharge, would benefit from home based palliative care on discharge.    Goals of Care and Additional Recommendations:  Limitations on Scope of Treatment: Full Scope Treatment  Code Status:    Code Status Orders  (From admission, onward)         Start     Ordered   05/13/18 0236  Full code  Continuous     05/13/18 0237        Code Status History    Date Active Date Inactive Code Status Order ID Comments User Context   1September 26, 202019 1723 04/04/2018 1523 Full Code [id number]  Ledell Noss, MD ED   11/06/2017 0215 11/12/2017 1924 Full Code [id number]  Rise Patience, MD Inpatient   09/23/2017 1948 09/26/2017 1707 Full Code [id number]  Tiffany Kocher, MD Inpatient   08/10/2017 1410 08/13/2017 2158 DNR [id number]  Everrett Coombe, MD ED   05/30/2017 1536 06/20/2017 1640 Full Code [id number]  Cathlyn Parsons, PA-C Inpatient   05/30/2017 1536 05/30/2017 1536 Full Code [id number]  Cathlyn Parsons, PA-C Inpatient   05/28/2017 0011 05/30/2017 1517 Full Code [id number]  Luanne Bras, MD Inpatient   05/27/2017 2135 05/28/2017 0011 Full Code [id number]  Kerney Elbe, MD ED       Prognosis:   Unable to determine  Discharge Planning:  To Be Determined  Care plan was discussed with Dr.G [place] Pink.   Thank you for allowing the  Palliative Medicine Team to assist in the care of this patient.   Total Time 25 Prolonged Time Billed No      Greater than 50%  of this time was spent counseling and coordinating care related to the above assessment and plan.  Loistine Chance, MD [phone] Please contact Palliative Medicine Team  phone at [phone] for questions and concerns.

## 2018-05-23 NOTE — Progress Notes (Signed)
PROGRESS NOTE    Derek Hays  ZDG:[id number] DOB: [birth year] DOA: 05/12/2018 PCP: [facility]    Brief Narrative: Derek Withem Harrellis a 85 y.o.malewithhistory of CVA with left-sided hemiparesis essentially bedbound, hypertension, atrial fibrillation, diastolic CHF, postoperative hypothyroidism was referred to the ER after patient's home health care nurse found that patient has been having productive cough for 3 days discolored sputum with weakness and also had some episodes of nausea vomiting and poor appetite. He was admitted for pneumonia.  Patient started improving.  Was incidentally found to have a liver lesion.  Plan is for biopsy.   Assessment & Plan:   Right middle lobe pneumonia? Aspiration pneumonia.  CT chest notable for right pleural effusion he was initially started on broad-spectrum antibiotics with IV vancomycin and Zosyn, later on transitioned to cefepime.  Patient has completed course of antibiotics.  Respiratory status remained stable.  He saturating normal on room air.    Acute metabolic encephalopathy probably secondary to pneumonia Mentation has improved.  Appears to be back to his baseline.  He is extremely hard of hearing.    Moderate oropharyngeal dysphagia Patient seen by palliative medicine.  Also seen by speech therapy.  Underwent modified barium swallow.  He is on a dysphagia diet.  Liver lesion MRI liver was done which raised concern for a malignant tumor.  Patient will need biopsy for definitive diagnosis.  This was discussed with patient as well as his wife.  Patient does want to know the diagnosis.  He however is not sure if he would want to pursue treatment if this is malignancy.  Patient was given the option of not undergoing biopsy at all.  However after further discussion he wants to proceed.  Eliquis is on hold.  Biopsy is tentatively planned for today.  AFP is pending.   Left eye conjunctivitis Continue with topical antibiotics with  Polytrim.  Improving.  Paroxysmal atrial fibrillation Rate controlled.  He is on apixaban for anticoagulation.  On hold due to need for biopsy.  History of chronic diastolic heart failure He appears to be compensated. Continue current medications.  Stage III CKD Creatinine appears to be at baseline. No changes.   History of CVA with residual left-sided hemiparesis Continue with apixaban and statin  History of chronic left hydro-ureteronephrosis with remote bladder cancer history Patient follows up with urology at [facility]. No new recommendations at this time.  DVT prophylaxis: Apixaban Code Status: Full code Family Communication: Discussed with patient and his wife Disposition Plan: PT is recommending skilled nursing facility versus home health.  Discussed with wife.  She would ideally like for him to come home.  Hopefully he will undergo his liver biopsy today.  Will be monitored overnight and then should be okay for discharge tomorrow.      Consultants:   Palliative care consult  Interventional radiology  Procedures:    Antimicrobials: Cefepime since admission; completed 7 days, stopped 05/20/2018.   Subjective: Patient feels well.  Denies any complaints.  Waiting on his biopsy.    Objective: Vitals:   05/22/18 2253 05/23/18 0500 05/23/18 0500 05/23/18 0843  BP: (!) 162/82  (!) 148/72 (!) 152/94  Pulse: 60  (!) 59 60  Resp: 16  16 16   Temp: 98.1 F (36.7 C)  98.1 F (36.7 C) 97.6 F (36.4 C)  TempSrc:   Oral Oral  SpO2: 96%  99% 97%  Weight:  92 kg    Height:        Intake/Output Summary (Last  24 hours) at 05/23/2018 1201 Last data filed at 05/23/2018 1007 Gross per 24 hour  Intake 120 ml  Output 225 ml  Net -105 ml   Filed Weights   05/20/18 0500 05/22/18 0500 05/23/18 0500  Weight: 92 kg 89.7 kg 92 kg    Examination:  General appearance: Awake alert.  In no distress.  Hard of hearing Resp: Normal effort.  Coarse breath sounds bilaterally.  No wheezing  rales or rhonchi.   Cardio: S1-S2 is normal regular.  No S3-S4.  No rubs murmurs or bruit GI: Abdomen is soft.  Nontender nondistended.  Bowel sounds are present normal.  No masses organomegaly Extremities: No edema.  Full range of motion of lower extremities. Neurologic: Alert and oriented x3.  Left hemiparesis.    Data Reviewed: I have personally reviewed following labs and imaging studies  CBC: Recent Labs  Lab 05/17/18 0605 05/20/18 0633 05/21/18 0629 05/22/18 0558 05/23/18 0648  WBC 7.8 8.2 7.4 8.2 6.4  HGB 12.8* 12.4* 12.5* 13.7 13.0  HCT 40.8 39.0 39.4 43.9 41.4  MCV 90.3 92.4 92.9 92.0 91.0  PLT 165 126* 122* 149* 314*   Basic Metabolic Panel: Recent Labs  Lab 05/17/18 0605 05/20/18 0633 05/21/18 0629 05/22/18 0558 05/23/18 0648  NA 144  --  144 145 142  K 3.5  --  3.9 3.9 3.7  CL 111  --  112* 111 108  CO2 24  --  22 24 25   GLUCOSE 98  --  76 77 106*  BUN 20  --  19 21 20   CREATININE 1.30* 1.20 1.18 1.23 1.19  CALCIUM 9.0  --  9.2 9.5 9.1   GFR: Estimated Creatinine Clearance: 49.8 mL/min (by C-G formula based on SCr of 1.19 mg/dL).   No results found for this or any previous visit (from the past 240 hour(s)).       Radiology Studies: No results found.      Scheduled Meds: . amLODipine  5 mg Oral Daily  . bethanechol  25 mg Oral 3 times per day  . cholecalciferol  2,000 Units Oral Daily  . folic acid  1 mg Oral Daily  . latanoprost  1 drop Both Eyes QHS  . levothyroxine  137 mcg Oral Q0600  . pregabalin  150 mg Oral BID  . tamsulosin  0.4 mg Oral QHS  . trimethoprim-polymyxin b  1 drop Left Eye Q4H   Continuous Infusions:    LOS: 10 days     Bonnielee Haff, MD [facility] Pager on [facility]   05/23/2018, 12:01 PM

## 2018-05-23 NOTE — Procedures (Signed)
Interventional Radiology Procedure Note  Procedure: US guided core biopsy, liver mass  Complications: None  Estimated Blood Loss: None  Recommendations: - Path pending  Signed,  Criselda Peaches, MD

## 2018-05-24 LAB — CBC
HEMATOCRIT: 45.1 % (ref 39.0–52.0)
Hemoglobin: 14 g/dL (ref 13.0–17.0)
MCH: 28.6 pg (ref 26.0–34.0)
MCHC: 31 g/dL (ref 30.0–36.0)
MCV: 92 fL (ref 80.0–100.0)
Platelets: 139 10*3/uL — ABNORMAL LOW (ref 150–400)
RBC: 4.9 MIL/uL (ref 4.22–5.81)
RDW: 15.5 % (ref 11.5–15.5)
WBC: 8 10*3/uL (ref 4.0–10.5)
nRBC: 0 % (ref 0.0–0.2)

## 2018-05-24 LAB — AFP TUMOR MARKER: AFP, Serum, Tumor Marker: 11690 ng/mL — ABNORMAL HIGH (ref 0.0–8.3)

## 2018-05-24 MED ORDER — POLYETHYLENE GLYCOL 3350 17 G PO PACK: 17.0000 g | PACK | Freq: Every day | ORAL | 0 refills | Status: AC | PRN

## 2018-05-24 MED ORDER — POLYMYXIN B-TRIMETHOPRIM 10000-0.1 UNIT/ML-% OP SOLN: 1.0000 [drp] | OPHTHALMIC | 0 refills | Status: AC

## 2018-05-24 MED ORDER — APIXABAN 5 MG PO TABS: 5.0000 mg | ORAL_TABLET | Freq: Two times a day (BID) | ORAL | Status: AC

## 2018-05-24 MED ORDER — AMLODIPINE BESYLATE 5 MG PO TABS: 5.0000 mg | ORAL_TABLET | Freq: Every day | ORAL | 0 refills | Status: AC

## 2018-05-24 NOTE — Discharge Summary (Addendum)
[facility]  Physician Discharge Summary   Patient ID: Derek Hays MRN: [id number] DOB/AGE: 83-Aug-1932 83 y.o.  Admit date: 05/12/2018 Discharge date: 05/24/2018  [facility]  DISCHARGE DIAGNOSES:  Right middle lobe pneumonia, resolved Acute metabolic encephalopathy, resolved Liver mass, adenocarcinoma Moderate oropharyngeal dysphagia Left eye conjunctivitis, improving Paroxysmal atrial fibrillation on anticoagulation History of CVA with left-sided hemiparesis Chronic diastolic CHF Chronic kidney disease stage III Chronic left hydroureteronephrosis history of bladder cancer   RECOMMENDATIONS FOR OUTPATIENT FOLLOW UP: 1. Pathology report after liver biopsy is pending 2. Patient's wife knows that they need to follow-up with the primary provider at the [place].   Home Health: Home health has been ordered including PT OT RN Equipment/Devices: None  CODE STATUS: Full code  DISCHARGE CONDITION: fair  Diet recommendation: Dysphagia 3 diet  INITIAL HISTORY: Derek Hays a 85 y.o.malewithhistory of CVA with left-sided hemiparesis essentially bedbound, hypertension, atrial fibrillation, diastolic CHF, postoperative hypothyroidism was referred to the ER after patient's home health care nurse found that patient has been having productive cough for 3 days discolored sputum with weakness and also had some episodes of nausea vomiting and poor appetite. He was admitted for pneumonia.  Patient started improving.  Was incidentally found to have a liver lesion.  Plan is for biopsy.  Consultations:  Palliative care consult  Interventional radiology   Procedures:  Liver biopsy   HOSPITAL COURSE:   ADDENDUM (05/27/2018) Pathology report from his liver biopsy was available over the weekend.  Report is positive for adenocarcinoma with origin likely in the biliary system or pancreas.  MRI done during his hospitalization did not show any pancreatic  lesions.  So this would be more suggestive of cholangiocarcinoma.  Patient's wife was called this afternoon and she was notified of the findings.  She also informed me that the patient has worsened over the last 24 hours.  Patient's primary care provider is involving hospice services.  This seems to be reasonable in his case.   Right middle lobe pneumonia? Aspiration pneumonia.  CT chest notable for right pleural effusion he was initially started on broad-spectrum antibiotics with IV vancomycin and Zosyn, later on transitioned to cefepime.  Patient has completed course of antibiotics.  Respiratory status remained stable.  He saturating normal on room air.    Acute metabolic encephalopathy probably secondary to pneumonia Mentation has improved.  Appears to be back to his baseline.  He is extremely hard of hearing.    Moderate oropharyngeal dysphagia Patient seen by palliative medicine.  Also seen by speech therapy.  Underwent modified barium swallow.  He is on a dysphagia diet.  Liver lesion/adenocarcinoma MRI liver was done which raised concern for a malignant tumor.  Patient will need biopsy for definitive diagnosis.  This was discussed with patient as well as his wife.  Patient does want to know the diagnosis.  He however is not sure if he would want to pursue treatment if this is malignancy.  Patient was given the option of not undergoing biopsy at all.  However after further discussion he wanted to proceed.  Eliquis was placed on hold.  He underwent a liver biopsy on 05/23/2018.  AFP is significantly elevated at 11,000.   Pathology report will likely only be available early next week.  This was communicated to the patient and his wife.  They will follow-up with their primary care providers at the [facility].  He may need to be referred to oncology based on the pathology report.  Left eye conjunctivitis Continue with topical antibiotics with Polytrim.  Improving.  Paroxysmal atrial  fibrillation Rate controlled.  He is on apixaban for anticoagulation.  On hold due to need for biopsy.  Okay to resume Eliquis from tomorrow morning.  History of chronic diastolic heart failure He appears to be compensated. Continue current medications.  Stage III CKD Creatinine appears to be at baseline. No changes.   History of CVA with residual left-sided hemiparesis Continue with apixaban and statin  History of chronic left hydro-ureteronephrosis with remote bladder cancer history Patient follows up with urology at [facility]. No new recommendations at this time.  Patient stable.  Hemoglobin is stable after liver biopsy.  Okay for discharge today.  He will need to be transported by ambulance.   PERTINENT LABS:  The results of significant diagnostics from this hospitalization (including imaging, microbiology, ancillary and laboratory) are listed below for reference.      Labs: Basic Metabolic Panel: Recent Labs  Lab 05/20/18 0633 05/21/18 0629 05/22/18 0558 05/23/18 0648  NA  --  144 145 142  K  --  3.9 3.9 3.7  CL  --  112* 111 108  CO2  --  22 24 25   GLUCOSE  --  76 77 106*  BUN  --  19 21 20   CREATININE 1.20 1.18 1.23 1.19  CALCIUM  --  9.2 9.5 9.1   Liver Function Tests: Recent Labs  Lab 05/22/18 0558 05/23/18 0648  AST 35 38  ALT 19 19  ALKPHOS 244* 221*  BILITOT 1.6* 0.8  PROT 6.7 6.2*  ALBUMIN 3.1* 2.9*   CBC: Recent Labs  Lab 05/20/18 0633 05/21/18 0629 05/22/18 0558 05/23/18 0648 05/24/18 0547  WBC 8.2 7.4 8.2 6.4 8.0  HGB 12.4* 12.5* 13.7 13.0 14.0  HCT 39.0 39.4 43.9 41.4 45.1  MCV 92.4 92.9 92.0 91.0 92.0  PLT 126* 122* 149* 130* 139*   BNP: BNP (last 3 results) Recent Labs    11/09/17 0313 05/12/18 2015  BNP 424.5* 201.9*     IMAGING STUDIES Dg Chest 2 View  Result Date: 05/12/2018 CLINICAL DATA:  Productive cough. EXAM: CHEST - 2 VIEW COMPARISON:  Chest x-ray dated March 31, 2018. FINDINGS: Unchanged left chest wall  pacemaker. Stable cardiomediastinal silhouette. Normal pulmonary vascularity. Patchy opacity in the right middle lobe. Unchanged small right pleural effusion. The left lung is clear. No pneumothorax. No acute osseous abnormality. IMPRESSION: 1. Right middle lobe pneumonia. 2. Unchanged small right pleural effusion. Electronically Signed   By: Titus Dubin M.D.   On: 05/12/2018 20:45   Ct Chest Wo Contrast  Result Date: 05/13/2018 CLINICAL DATA:  Pneumonia, unresolved for complicated EXAM: CT CHEST, ABDOMEN AND PELVIS WITHOUT CONTRAST TECHNIQUE: Multidetector CT imaging of the chest, abdomen and pelvis was performed following the standard protocol without IV contrast. COMPARISON:  Chest x-ray from yesterday FINDINGS: CT CHEST FINDINGS Cardiovascular: Normal heart size. Small pericardial effusion that is low-density and chronic. Dual-chamber pacer leads from the left. No acute vascular finding without contrast. Extensive atherosclerotic calcification. Mediastinum/Nodes: Negative for adenopathy or mass. Lungs/Pleura: Moderate to large layering right pleural effusion with lower lobe atelectasis. Streaky opacities on both sides is most consistent with atelectasis. Airway thickening that is generalized. Musculoskeletal: Spondylosis with multi-level ankylosis. CT ABDOMEN PELVIS FINDINGS Hepatobiliary: Ill-defined low-density in the central liver, worrisome for infiltrating mass.Cholecystectomy with no bile duct dilatation. Pancreas: Unremarkable. Spleen: Generalized atrophy Adrenals/Urinary Tract: Negative adrenals. Severe right renal atrophy. Chronic left hydroureteronephrosis above a dilated lobulated bladder  suggestive of chronic outlet obstruction. Stomach/Bowel: No obstruction. No evident inflammation. Extensive colonic diverticulosis. Vascular/Lymphatic: Diffuse atherosclerotic calcification. Focal displacement of intimal calcification in the infrarenal aorta, chronic. No mass or adenopathy.  Reproductive:Question prostatectomy. Other: No ascites or pneumoperitoneum. Musculoskeletal: Lumbar fusion and laminectomy with L2-L5 solid arthrodesis. No acute or aggressive finding. IMPRESSION: 1. Lung opacities are best attributed atelectasis. No definite pneumonia. There is generalized airway thickening and a chronic right pleural effusion that has increased from December 2019, now moderate to large. 2. Ill-defined low-density in the central liver worrisome for infiltrating mass. Depending on GFR trend and ability to breath hold, enhanced abdominal CT or liver MRI is recommended. 3. Severely atrophic and nonfunctional right kidney. There is chronic but progressive left hydroureteronephrosis above a bladder showing signs of chronic outlet obstruction. 4. Multiple additional chronic findings described above. Electronically Signed   By: Monte Fantasia M.D.   On: 05/13/2018 07:55   Mr Liver W Wo Contrast  Result Date: 05/20/2018 CLINICAL DATA:  Hepatic mass suspected on recent noncontrast CT. EXAM: MRI ABDOMEN WITHOUT AND WITH CONTRAST TECHNIQUE: Multiplanar multisequence MR imaging of the abdomen was performed both before and after the administration of intravenous contrast. CONTRAST:  10 mL Gadavist COMPARISON:  Noncontrast CT on 05/13/2018 FINDINGS: Lower chest: Moderate right and tiny left pleural effusions again noted. Hepatobiliary: A large heterogeneously enhancing soft tissue mass is seen in the central liver involving both the right and left lobes and liver hilum. This measures 11.3 x 8.3 cm. Portal and hepatic veins remain patent. There is mild peripheral intrahepatic biliary ductal dilatation seen in the right hepatic lobe, but there is no evidence of extrahepatic biliary ductal dilatation. Pancreas:  No mass or inflammatory changes. Spleen:  Within normal limits in size and appearance. Adrenals/Urinary Tract: Normal adrenal glands. The right kidney is severely atrophic and nonfunctioning. No left  renal masses are identified, however severe left hydronephrosis is demonstrated as seen on previous CT. Stomach/Bowel: Visualized portion unremarkable. Vascular/Lymphatic: No pathologically enlarged lymph nodes identified. No abdominal aortic aneurysm. Other:  None. Musculoskeletal:  No suspicious bone lesions identified. IMPRESSION: 11.3 cm heterogeneously enhancing mass involving the central right and left lobes and liver hilum. Differential diagnosis includes intrahepatic cholangiocarcinoma, gallbladder carcinoma, hepatocellular carcinoma, or less likely liver metastasis. No other sites of malignancy identified within the abdomen. Severe left hydronephrosis as demonstrated on recent CAP CT. Moderate right pleural effusion and tiny left pleural effusion. Electronically Signed   By: Earle Gell M.D.   On: 05/20/2018 16:11   US Biopsy (liver)  Result Date: 05/23/2018 INDICATION: 85 year old male with a large central hepatic mass. He presents for image guided biopsy of the same. EXAM: Ultrasound-guided core biopsy of hepatic mass MEDICATIONS: None. ANESTHESIA/SEDATION: Moderate (conscious) sedation was employed during this procedure. A total of Versed 0.5 mg and Fentanyl 25 mcg was administered intravenously. Moderate Sedation Time: 10 minutes. The patient's level of consciousness and vital signs were monitored continuously by radiology nursing throughout the procedure under my direct supervision. FLUOROSCOPY TIME:  None COMPLICATIONS: None immediate. PROCEDURE: Informed written consent was obtained from the patient after a thorough discussion of the procedural risks, benefits and alternatives. All questions were addressed. Maximal Sterile Barrier Technique was utilized including caps, mask, sterile gowns, sterile gloves, sterile drape, hand hygiene and skin antiseptic. A timeout was performed prior to the initiation of the procedure. Ultrasound was used to interrogate the liver. A large heterogeneous hypoechoic  masses evident in the central liver. The mass measures approximately 11.4  x 8.6 x 9.3 cm. A suitable skin entry site was selected and marked. The overlying skin was sterilely prepped and draped in the standard fashion using chlorhexidine skin prep. Local anesthesia was attained by infiltration with 1% lidocaine. A small dermatotomy was made. Under real-time sonographic guidance, a 17 gauge introducer needle was carefully advanced into the margin of the mass. Multiple 18 gauge core biopsies were then coaxially obtained. The biopsy specimens were placed in formalin and delivered to pathology for further analysis. As the introducer needle was removed, the biopsy tract was embolized with a Gel-Foam slurry. Post ultrasound imaging demonstrates no evidence of acute complication or active bleeding. The patient tolerated the procedure well. IMPRESSION: Technically successful ultrasound-guided core biopsy of central hepatic mass. Electronically Signed   By: Jacqulynn Cadet M.D.   On: 05/23/2018 15:17   Dg Swallowing Func-speech Pathology  Result Date: 05/19/2018 Objective Swallowing Evaluation: Type of Study: MBS-Modified Barium Swallow Study  Patient Details Name: Derek Hays MRN: [id number] Date of Birth: [birth year] Today's Date: 05/19/2018 Time: SLP Start Time (ACUTE ONLY): 1020 -SLP Stop Time (ACUTE ONLY): 1040 SLP Time Calculation (min) (ACUTE ONLY): 20 min Past Medical History: Past Medical History: Diagnosis Date . Atrial fibrillation ([place])  . Bladder cancer ([place]) 1984 . Hypertension  . Stroke [facility])  Past Surgical History: Past Surgical History: Procedure Laterality Date . IR CT HEAD LTD  05/27/2017 . IR PERCUTANEOUS ART THROMBECTOMY/INFUSION INTRACRANIAL INC DIAG ANGIO  05/27/2017 . PACEMAKER INSERTION   . RADIOLOGY WITH ANESTHESIA N/A 05/27/2017  Procedure: RADIOLOGY WITH ANESTHESIA;  Surgeon: Luanne Bras, MD;  Location: [place];  Service: Radiology;  Laterality: N/A; HPI: pt is 85 yo male adm to [facility] with  respiratory difficulties.  PMH + for CVA, recurrent pnas with recent hospital admit, Afib and h/o bladder cancer.  MBS ordered as pt has h/o dysphagia.   Subjective: Pt resting in bed. No family present. Assessment / Plan / Recommendation CHL IP CLINICAL IMPRESSIONS 05/15/2018 Clinical Impression  Patient presents with moderate oropharyngeal dysphagia - oral deficits c/b decreased lingual control, lingual pumping, premature spillage of boluses and residuals that pt senses only if not in left buccal region.  SILENT spiration x2 occured during testing - once with nectar as small portion of bolus spilled into open airway before swallow triggered.  Aspiration of suspected TRACE puree pharyngeal residuals (mixed with secretions) also noted just below vocal folds.  Of note, pt did cough once reflexively during MBS, likely due to aspirates reaching carina.  Pt's head in natural chin tuck position which increased oral deficits due to gravity.  Cued dry swallows helpful to decrease oropharyngeal residuals.  Expectoration of pudding residuals from left lateral sulci achieved after completion of study.  As pt reports more problems coughing with solids than liquids and oral deficits significant, at this time recommend consider full liquid for now and advancing as medically improved.  Pt educated to findings/recommendations and SLP will follow.  In addition, esophageal clearance appeared impaired with suspected tertiary contractions and lack of pt sensation.  Given pt advanced age, stroke related chronic dysphagia/aspiration and recurrent admits with imaging showing concerns for pna - highly recommend palliative consult for Mr Noffke.   Swallow Evaluation Recommendations      SLP Diet Recommendations: Thin liquid;Nectar thick liquid(full liquids)   Liquid Administration via: Straw   Medication Administration: Crushed with puree   Supervision: Staff to assist with self feeding;Full supervision/cueing for compensatory  strategies   Compensations: Slow rate;Small sips/bites;Multiple dry swallows  after each bite/sip;Follow solids with liquid;Other (Comment);Lingual sweep for clearance of pocketing;Minimize environmental distractions(intermittently clear throat/cough)   Postural Changes: Remain semi-upright after after feeds/meals (Comment);Seated upright at 90 degrees   Oral Care Recommendations: Oral care BID   Other Recommendations: Have oral suction available  SLP Visit Diagnosis Dysphagia, oropharyngeal phase (R13.12);Dysphagia, pharyngoesophageal phase (R13.14) Attention and concentration deficit following -- Frontal lobe and executive function deficit following -- Impact on safety and function --   CHL IP TREATMENT RECOMMENDATION 05/14/2018 Treatment Recommendations Therapy as outlined in treatment plan below   Prognosis 05/14/2018 Prognosis for Safe Diet Advancement Guarded Barriers to Reach Goals Severity of deficits Barriers/Prognosis Comment -- CHL IP DIET RECOMMENDATION 05/15/2018 SLP Diet Recommendations -- Liquid Administration via -- Medication Administration -- Compensations Slow rate;Small sips/bites;Multiple dry swallows after each bite/sip;Follow solids with liquid;Other (Comment);Lingual sweep for clearance of pocketing;Minimize environmental distractions Postural Changes --   CHL IP OTHER RECOMMENDATIONS 05/14/2018 Recommended Consults -- Oral Care Recommendations Oral care BID Other Recommendations Have oral suction available   CHL IP FOLLOW UP RECOMMENDATIONS 05/15/2018 Follow up Recommendations Skilled Nursing facility   [facility] IP FREQUENCY AND DURATION 05/14/2018 Speech Therapy Frequency (ACUTE ONLY) min 1 x/week Treatment Duration 1 week      CHL IP ORAL PHASE 05/14/2018 Oral Phase Impaired Oral - Pudding Teaspoon -- Oral - Pudding Cup -- Oral - Honey Teaspoon -- Oral - Honey Cup -- Oral - Nectar Teaspoon NT Oral - Nectar Cup -- Oral - Nectar Straw Weak lingual manipulation;Lingual pumping;Lingual/palatal  residue;Delayed oral transit;Decreased bolus cohesion;Premature spillage Oral - Thin Teaspoon Premature spillage;Decreased bolus cohesion;Weak lingual manipulation;Reduced posterior propulsion;Lingual pumping;Delayed oral transit Oral - Thin Cup Delayed oral transit;Premature spillage;Decreased bolus cohesion;Weak lingual manipulation;Lingual pumping Oral - Thin Straw Decreased bolus cohesion;Premature spillage;Delayed oral transit;Weak lingual manipulation;Lingual pumping Oral - Puree Weak lingual manipulation;Lingual/palatal residue;Left pocketing in lateral sulci;Lingual pumping;Premature spillage Oral - Mech Soft Lingual/palatal residue;Left pocketing in lateral sulci;Lingual pumping;Weak lingual manipulation;Premature spillage;Impaired mastication;Delayed oral transit Oral - Regular -- Oral - Multi-Consistency -- Oral - Pill -- Oral Phase - Comment cued dry swallows, expectoration of residual pudding  CHL IP PHARYNGEAL PHASE 05/14/2018 Pharyngeal Phase Impaired Pharyngeal- Pudding Teaspoon -- Pharyngeal -- Pharyngeal- Pudding Cup -- Pharyngeal -- Pharyngeal- Honey Teaspoon -- Pharyngeal -- Pharyngeal- Honey Cup -- Pharyngeal -- Pharyngeal- Nectar Teaspoon -- Pharyngeal -- Pharyngeal- Nectar Cup -- Pharyngeal -- Pharyngeal- Nectar Straw Penetration/Aspiration before swallow;Reduced epiglottic inversion;Reduced laryngeal elevation;Reduced airway/laryngeal closure;Reduced tongue base retraction Pharyngeal Material enters airway, passes BELOW cords without attempt by patient to eject out (silent aspiration) Pharyngeal- Thin Teaspoon Pharyngeal residue - valleculae;Pharyngeal residue - pyriform Pharyngeal -- Pharyngeal- Thin Cup Reduced epiglottic inversion;Reduced anterior laryngeal mobility;Reduced laryngeal elevation;Reduced airway/laryngeal closure;Reduced tongue base retraction;Penetration/Aspiration before swallow;Pharyngeal residue - valleculae;Pharyngeal residue - pyriform Pharyngeal Material enters airway,  CONTACTS cords and then ejected out Pharyngeal- Thin Straw Reduced epiglottic inversion;Reduced laryngeal elevation;Reduced tongue base retraction;Reduced airway/laryngeal closure;Penetration/Aspiration before swallow;Pharyngeal residue - valleculae;Pharyngeal residue - pyriform Pharyngeal Material enters airway, CONTACTS cords and not ejected out Pharyngeal- Puree Reduced pharyngeal peristalsis;Reduced epiglottic inversion;Reduced anterior laryngeal mobility;Reduced laryngeal elevation;Reduced airway/laryngeal closure;Reduced tongue base retraction;Pharyngeal residue - valleculae;Pharyngeal residue - pyriform;Compensatory strategies attempted (with notebox);Delayed swallow initiation-vallecula Pharyngeal -- Pharyngeal- Mechanical Soft Delayed swallow initiation-vallecula;Reduced epiglottic inversion;Reduced tongue base retraction;Pharyngeal residue - valleculae;Pharyngeal residue - pyriform Pharyngeal -- Pharyngeal- Regular -- Pharyngeal -- Pharyngeal- Multi-consistency -- Pharyngeal -- Pharyngeal- Pill -- Pharyngeal -- Pharyngeal Comment cued dry swallow helpful to decrease residuals, liquid swallow helpful to decrease residuals but also resulted in minimal laryngeal  penetration, ? aspiration of puree residual after the swallow due to residue spilling into open airway (when flouro was turned off)  CHL IP CERVICAL ESOPHAGEAL PHASE 05/14/2018 Cervical Esophageal Phase Impaired Pudding Teaspoon -- Pudding Cup -- Honey Teaspoon -- Honey Cup -- Nectar Teaspoon -- Nectar Cup -- Nectar Straw -- Thin Teaspoon -- Thin Cup -- Thin Straw -- Puree -- Mechanical Soft -- Regular -- Multi-consistency -- Pill -- Cervical Esophageal Comment residuals noted at pyriform sinus = decreased laryngeal closure can contribute to this  Macario Golds 05/19/2018, 12:54 PM Luanna Salk, MS [facility] SLP Acute Rehab Services Pager [phone] Office [phone] Test conducted 05/14/2018             Ct Renal Stone Study  Result Date:  05/13/2018 CLINICAL DATA:  Pneumonia, unresolved for complicated EXAM: CT CHEST, ABDOMEN AND PELVIS WITHOUT CONTRAST TECHNIQUE: Multidetector CT imaging of the chest, abdomen and pelvis was performed following the standard protocol without IV contrast. COMPARISON:  Chest x-ray from yesterday FINDINGS: CT CHEST FINDINGS Cardiovascular: Normal heart size. Small pericardial effusion that is low-density and chronic. Dual-chamber pacer leads from the left. No acute vascular finding without contrast. Extensive atherosclerotic calcification. Mediastinum/Nodes: Negative for adenopathy or mass. Lungs/Pleura: Moderate to large layering right pleural effusion with lower lobe atelectasis. Streaky opacities on both sides is most consistent with atelectasis. Airway thickening that is generalized. Musculoskeletal: Spondylosis with multi-level ankylosis. CT ABDOMEN PELVIS FINDINGS Hepatobiliary: Ill-defined low-density in the central liver, worrisome for infiltrating mass.Cholecystectomy with no bile duct dilatation. Pancreas: Unremarkable. Spleen: Generalized atrophy Adrenals/Urinary Tract: Negative adrenals. Severe right renal atrophy. Chronic left hydroureteronephrosis above a dilated lobulated bladder suggestive of chronic outlet obstruction. Stomach/Bowel: No obstruction. No evident inflammation. Extensive colonic diverticulosis. Vascular/Lymphatic: Diffuse atherosclerotic calcification. Focal displacement of intimal calcification in the infrarenal aorta, chronic. No mass or adenopathy. Reproductive:Question prostatectomy. Other: No ascites or pneumoperitoneum. Musculoskeletal: Lumbar fusion and laminectomy with L2-L5 solid arthrodesis. No acute or aggressive finding. IMPRESSION: 1. Lung opacities are best attributed atelectasis. No definite pneumonia. There is generalized airway thickening and a chronic right pleural effusion that has increased from December 2019, now moderate to large. 2. Ill-defined low-density in the  central liver worrisome for infiltrating mass. Depending on GFR trend and ability to breath hold, enhanced abdominal CT or liver MRI is recommended. 3. Severely atrophic and nonfunctional right kidney. There is chronic but progressive left hydroureteronephrosis above a bladder showing signs of chronic outlet obstruction. 4. Multiple additional chronic findings described above. Electronically Signed   By: Monte Fantasia M.D.   On: 05/13/2018 07:55    DISCHARGE EXAMINATION: Vitals:   05/23/18 1430 05/23/18 1459 05/23/18 2109 05/24/18 0552  BP: (!) 143/82 140/81 130/83 (!) 146/85  Pulse: 60 60 (!) 59 60  Resp: 14 16 20 16   Temp:  (!) 97.4 F (36.3 C) (!) 97.5 F (36.4 C) (!) 97.4 F (36.3 C)  TempSrc:   Oral   SpO2: 100% 93% 99% 98%  Weight:    91.4 kg  Height:       General appearance: alert, cooperative, appears stated age and no distress Resp: clear to auscultation bilaterally Cardio: regular rate and rhythm, S1, S2 normal, no murmur, click, rub or gallop GI: soft, non-tender; bowel sounds normal; no masses,  no organomegaly  DISPOSITION: Home with home health  Discharge Instructions    Call MD for:  difficulty breathing, headache or visual disturbances   Complete by:  As directed    Call MD for:  extreme fatigue  Complete by:  As directed    Call MD for:  persistant dizziness or light-headedness   Complete by:  As directed    Call MD for:  persistant nausea and vomiting   Complete by:  As directed    Call MD for:  severe uncontrolled pain   Complete by:  As directed    Call MD for:  temperature >100.4   Complete by:  As directed    Diet - low sodium heart healthy   Complete by:  As directed    Discharge instructions   Complete by:  As directed    Please take your medications as prescribed.  Resume Eliquis from tomorrow, 05/25/2018.  Your pathology report will not be available till Monday or Tuesday of this coming week.  Your primary care physician may need to refer you to  an oncologist depending on the pathology report.  You were cared for by a hospitalist during your hospital stay. If you have any questions about your discharge medications or the care you received while you were in the hospital after you are discharged, you can call the unit and asked to speak with the hospitalist on call if the hospitalist that took care of you is not available. Once you are discharged, your primary care physician will handle any further medical issues. Please note that NO REFILLS for any discharge medications will be authorized once you are discharged, as it is imperative that you return to your primary care physician (or establish a relationship with a primary care physician if you do not have one) for your aftercare needs so that they can reassess your need for medications and monitor your lab values. If you do not have a primary care physician, you can call [phone] for a physician referral.   Increase activity slowly   Complete by:  As directed         Allergies as of 05/24/2018   No Known Allergies     Medication List    STOP taking these medications   atorvastatin 40 MG tablet Commonly known as:  LIPITOR     TAKE these medications   albuterol (2.5 MG/3ML) 0.083% nebulizer solution Commonly known as:  PROVENTIL Take 3 mLs (2.5 mg total) by nebulization every 6 (six) hours. What changed:    when to take this  reasons to take this   amLODipine 5 MG tablet Commonly known as:  NORVASC Take 1 tablet (5 mg total) by mouth daily. Start taking on:  May 25, 2018 What changed:    medication strength  how much to take   apixaban 5 MG Tabs tablet Commonly known as:  ELIQUIS Take 1 tablet (5 mg total) by mouth 2 (two) times daily. RESUME FROM 05/25/2018 Start taking on:  May 25, 2018 What changed:  additional instructions   benzonatate 100 MG capsule Commonly known as:  TESSALON Take 100 mg by mouth 3 (three) times daily as needed for cough.    bethanechol 25 MG tablet Commonly known as:  URECHOLINE Take 1 tablet (25 mg total) by mouth 3 (three) times daily. What changed:  additional instructions   coal tar 0.5 % shampoo Commonly known as:  NEUTROGENA T-GEL Apply 1 application topically daily as needed (dandruff).   folic acid 1 MG tablet Commonly known as:  FOLVITE Take 1 mg by mouth daily.   furosemide 20 MG tablet Commonly known as:  LASIX Take 20 mg by mouth daily as needed for fluid.   levothyroxine 137 MCG  tablet Commonly known as:  SYNTHROID, LEVOTHROID Take 137 mcg by mouth daily before breakfast.   polyethylene glycol packet Commonly known as:  MIRALAX / GLYCOLAX Take 17 g by mouth daily as needed for mild constipation or moderate constipation.   pregabalin 200 MG capsule Commonly known as:  LYRICA Take 200 mg by mouth 2 (two) times daily.   PRESERVISION AREDS 2 Caps Take 1 capsule by mouth 2 (two) times daily.   tamsulosin 0.4 MG Caps capsule Commonly known as:  FLOMAX Take 1 capsule (0.4 mg total) by mouth daily after supper. What changed:  when to take this   timolol 0.5 % ophthalmic gel-forming Commonly known as:  TIMOPTIC-XR Place 1 drop into the left eye at bedtime.   traZODone 100 MG tablet Commonly known as:  DESYREL Take 200 mg by mouth at bedtime.   trimethoprim-polymyxin b ophthalmic solution Commonly known as:  POLYTRIM Place 1 drop into the left eye every 4 (four) hours.   Vitamin D 50 MCG (2000 UT) tablet Take 2,000 Units by mouth daily.   XALATAN 0.005 % ophthalmic solution Generic drug:  latanoprost Place 1 drop into the right eye at bedtime.          TOTAL DISCHARGE TIME: 35 minutes  Krishauna Schatzman [employer] on [email or web address]  05/24/2018, 10:47 AM

## 2018-05-24 NOTE — Progress Notes (Addendum)
Contacted [employer] rep, Dorian Pod to make aware of dc home today. Jonnie Finner RN CCM Case Mgmt phone [phone]  Contacted wife and states pt is prearranged from [place] with [facility]. States it was preapproved prior to admission. Contacted [employer] to make aware. Contacted [facility] to make aware of dc home. Wife wants [facility]AR.  Jonnie Finner RN CCM Case Mgmt phone [phone]

## 2018-05-24 NOTE — Discharge Instructions (Signed)

## 2018-05-29 DIAGNOSIS — I679 Cerebrovascular disease, unspecified: Secondary | ICD-10-CM | POA: Diagnosis not present

## 2018-05-29 DIAGNOSIS — I4891 Unspecified atrial fibrillation: Secondary | ICD-10-CM | POA: Diagnosis not present

## 2018-05-29 DIAGNOSIS — C228 Malignant neoplasm of liver, primary, unspecified as to type: Secondary | ICD-10-CM | POA: Diagnosis not present

## 2018-05-29 DIAGNOSIS — E039 Hypothyroidism, unspecified: Secondary | ICD-10-CM | POA: Diagnosis not present

## 2018-05-29 DIAGNOSIS — I10 Essential (primary) hypertension: Secondary | ICD-10-CM | POA: Diagnosis not present

## 2018-05-30 DIAGNOSIS — E039 Hypothyroidism, unspecified: Secondary | ICD-10-CM | POA: Diagnosis not present

## 2018-05-30 DIAGNOSIS — I10 Essential (primary) hypertension: Secondary | ICD-10-CM | POA: Diagnosis not present

## 2018-05-30 DIAGNOSIS — C228 Malignant neoplasm of liver, primary, unspecified as to type: Secondary | ICD-10-CM | POA: Diagnosis not present

## 2018-05-30 DIAGNOSIS — I4891 Unspecified atrial fibrillation: Secondary | ICD-10-CM | POA: Diagnosis not present

## 2018-05-30 DIAGNOSIS — I679 Cerebrovascular disease, unspecified: Secondary | ICD-10-CM | POA: Diagnosis not present

## 2018-05-31 DIAGNOSIS — I10 Essential (primary) hypertension: Secondary | ICD-10-CM | POA: Diagnosis not present

## 2018-05-31 DIAGNOSIS — I679 Cerebrovascular disease, unspecified: Secondary | ICD-10-CM | POA: Diagnosis not present

## 2018-05-31 DIAGNOSIS — C228 Malignant neoplasm of liver, primary, unspecified as to type: Secondary | ICD-10-CM | POA: Diagnosis not present

## 2018-05-31 DIAGNOSIS — E039 Hypothyroidism, unspecified: Secondary | ICD-10-CM | POA: Diagnosis not present

## 2018-05-31 DIAGNOSIS — I4891 Unspecified atrial fibrillation: Secondary | ICD-10-CM | POA: Diagnosis not present

## 2018-05-31 DIAGNOSIS — K5903 Drug induced constipation: Secondary | ICD-10-CM | POA: Diagnosis not present

## 2018-05-31 DIAGNOSIS — C229 Malignant neoplasm of liver, not specified as primary or secondary: Secondary | ICD-10-CM | POA: Diagnosis not present

## 2018-05-31 DIAGNOSIS — Z7689 Persons encountering health services in other specified circumstances: Secondary | ICD-10-CM | POA: Diagnosis not present

## 2018-05-31 DIAGNOSIS — F419 Anxiety disorder, unspecified: Secondary | ICD-10-CM | POA: Diagnosis not present

## 2018-06-01 DIAGNOSIS — E039 Hypothyroidism, unspecified: Secondary | ICD-10-CM | POA: Diagnosis not present

## 2018-06-01 DIAGNOSIS — I4891 Unspecified atrial fibrillation: Secondary | ICD-10-CM | POA: Diagnosis not present

## 2018-06-01 DIAGNOSIS — F419 Anxiety disorder, unspecified: Secondary | ICD-10-CM | POA: Diagnosis not present

## 2018-06-01 DIAGNOSIS — I679 Cerebrovascular disease, unspecified: Secondary | ICD-10-CM | POA: Diagnosis not present

## 2018-06-01 DIAGNOSIS — C229 Malignant neoplasm of liver, not specified as primary or secondary: Secondary | ICD-10-CM | POA: Diagnosis not present

## 2018-06-01 DIAGNOSIS — C228 Malignant neoplasm of liver, primary, unspecified as to type: Secondary | ICD-10-CM | POA: Diagnosis not present

## 2018-06-01 DIAGNOSIS — K5903 Drug induced constipation: Secondary | ICD-10-CM | POA: Diagnosis not present

## 2018-06-01 DIAGNOSIS — I10 Essential (primary) hypertension: Secondary | ICD-10-CM | POA: Diagnosis not present

## 2018-06-15 DEATH — deceased

## 2019-02-20 IMAGING — MR MR MRA HEAD W/O CM
9 of 11 series · 31 of 48 positions shown · non-contrast
Comparison: CTA 05/27/2017

CLINICAL DATA: Stroke.  Endovascular clot retrieval 05/27/2017

EXAM:
MRI HEAD WITHOUT CONTRAST
MRA HEAD WITHOUT CONTRAST
TECHNIQUE: Multiplanar, multiecho pulse sequences of the brain and surrounding
structures were obtained without intravenous contrast. Angiographic
images of the head were obtained using MRA technique without
contrast.

[Series 3: DWI · axial · 3.0mm · 1.09mm/px · z∈[-95,+47]mm · 7 of 98 slices shown (1 of 4)]
[im 1/98]
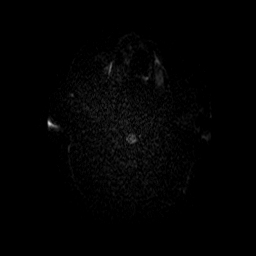
[im 17/98]
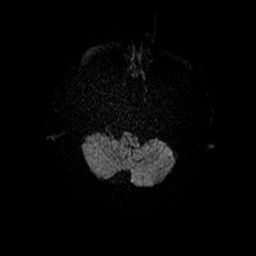
[im 33/98]
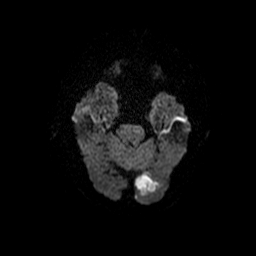
[im 49/98]
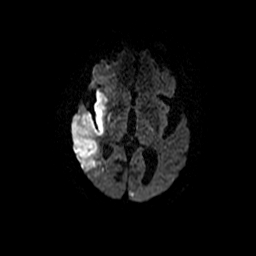
[im 65/98]
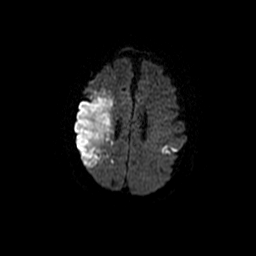
[im 81/98]
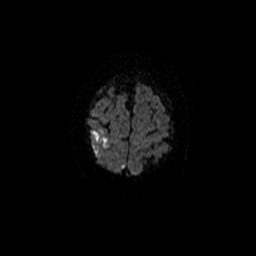
[im 98/98]
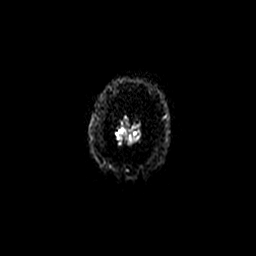

[Series 6: DWI · coronal · 5.0mm · 1.09mm/px · 5 of 68 slices shown (2 of 4)]
[im 1/68]
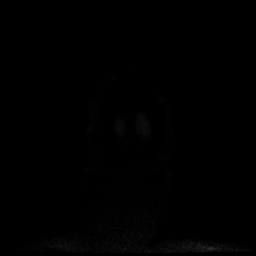
[im 17/68]
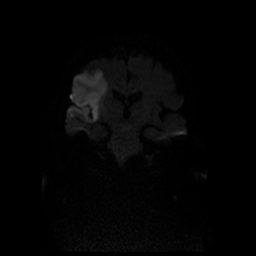
[im 34/68]
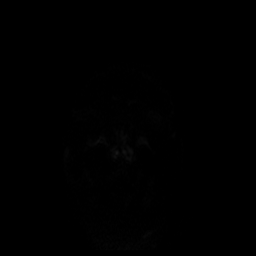
[im 51/68]
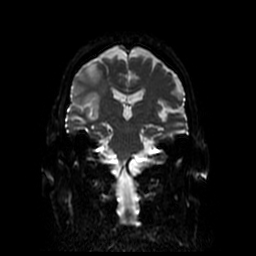
[im 68/68]
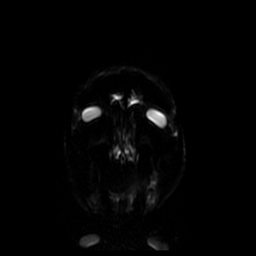

[Series 7: (id) mt fs · axial · 1.4mm · 0.43mm/px · z∈[-58,-20]mm · 4 of 136 slices shown]
[im 1/136]
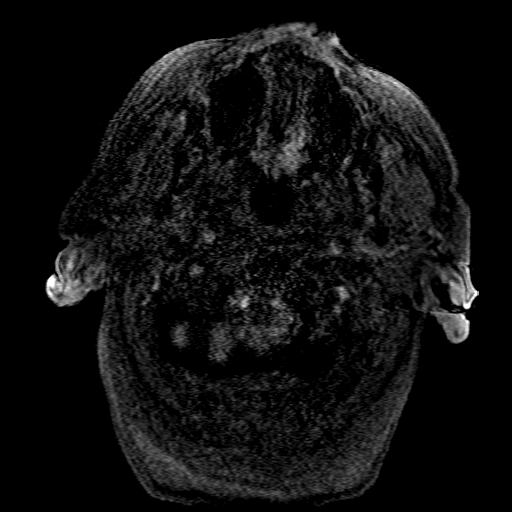
[im 28/136]
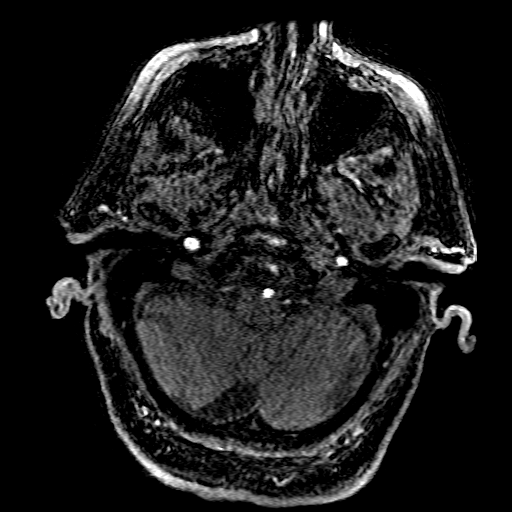
[im 41/136]
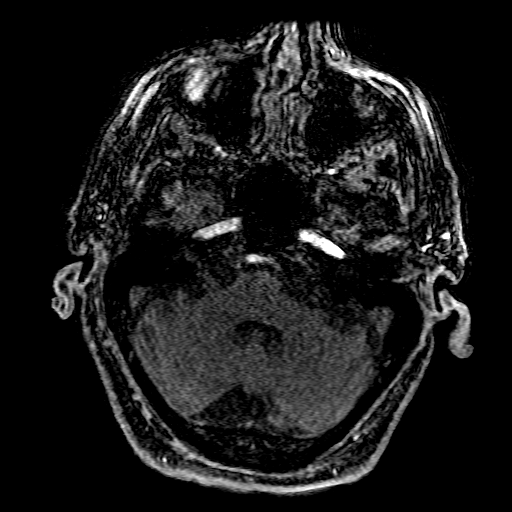
[im 55/136]
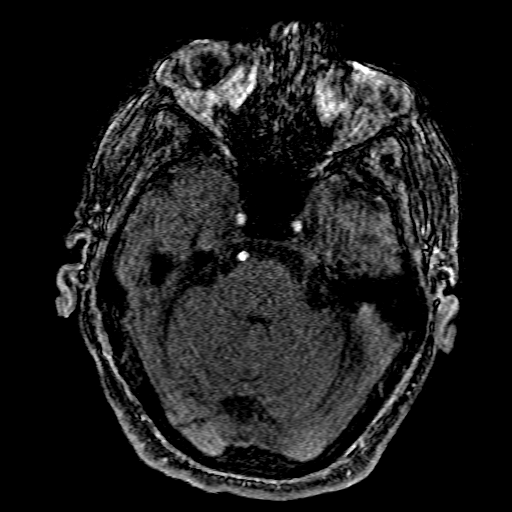

[Series 8: FLAIR · axial · 5.0mm · 0.43mm/px · z∈[-65,+84]mm · 2 of 26 slices shown]
[im 1/26]
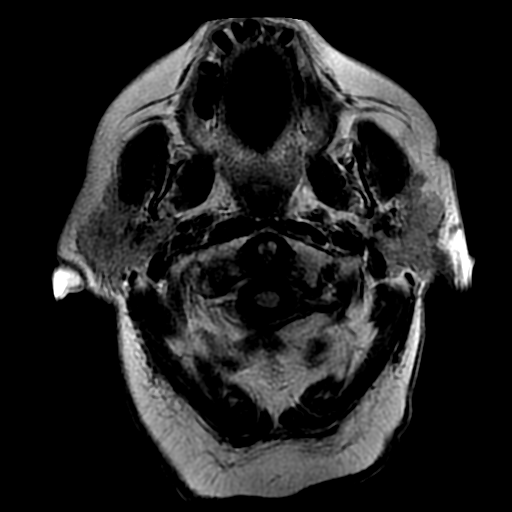
[im 26/26]
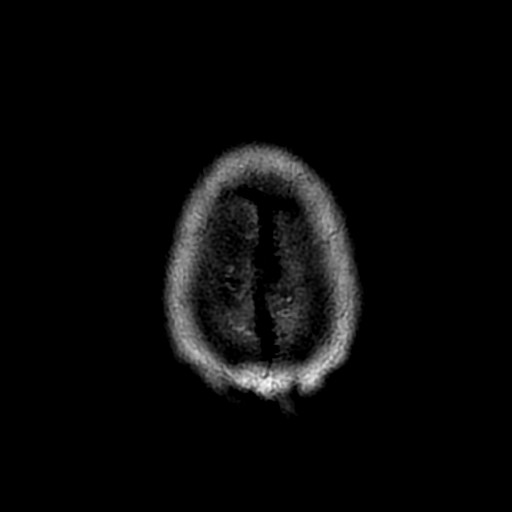

[Series 9: T2 · axial · 5.0mm · 0.43mm/px · z∈[-65,+84]mm · 2 of 26 slices shown (1 of 2)]
[im 1/26]
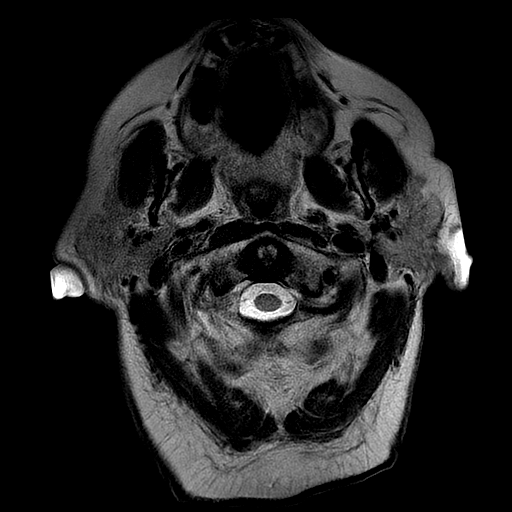
[im 26/26]
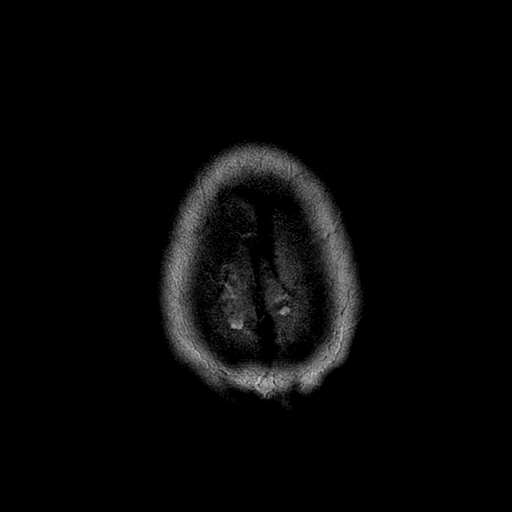

[Series 10: T2 · coronal · 5.0mm · 0.43mm/px · 2 of 29 slices shown (2 of 2)]
[im 1/29]
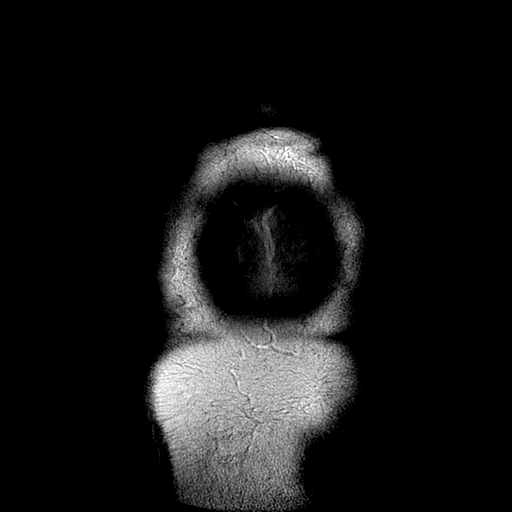
[im 29/29]
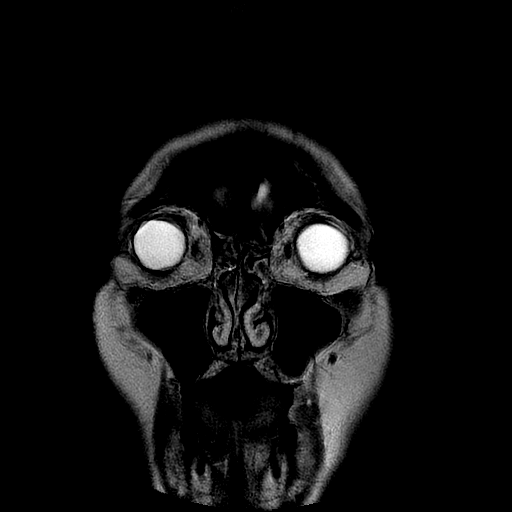

[Series 13: T1 · sagittal · 5.0mm · 0.47mm/px · 2 of 23 slices shown]
[im 1/23]
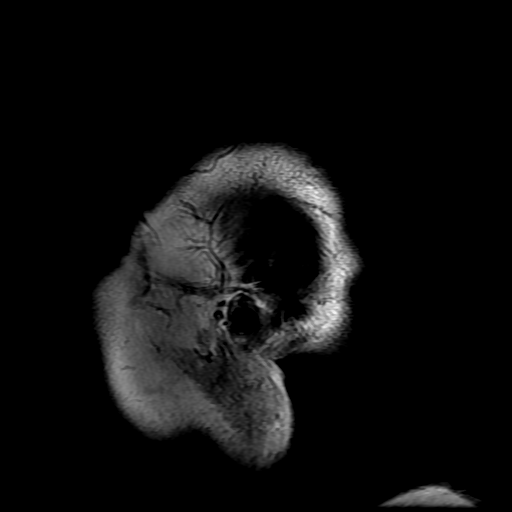
[im 23/23]
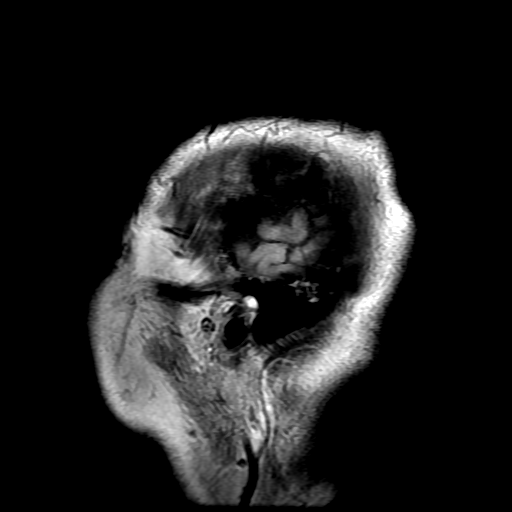

[Series 300: DWI · axial · 3.0mm · 1.09mm/px · z∈[-95,+47]mm · 4 of 49 slices shown (3 of 4)]
[im 1/49]
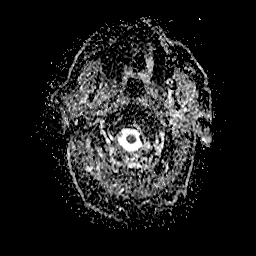
[im 17/49]
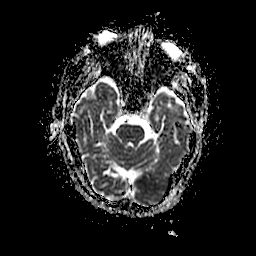
[im 33/49]
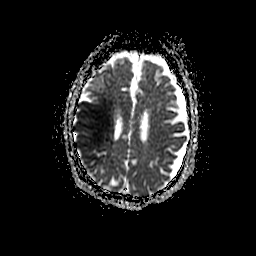
[im 49/49]
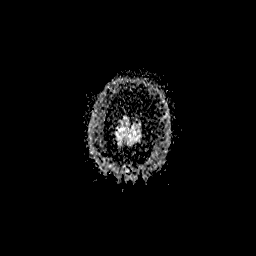

[Series 600: DWI · coronal · 5.0mm · 1.09mm/px · 3 of 34 slices shown (4 of 4)]
[im 1/34]
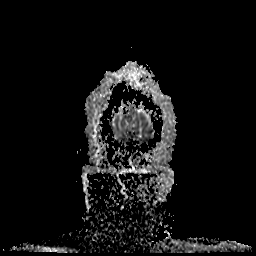
[im 17/34]
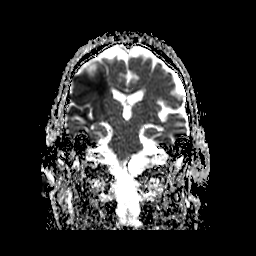
[im 34/34]
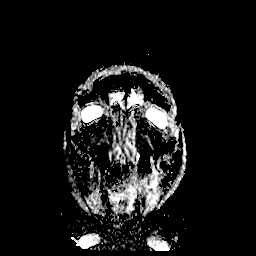

[31 of 48 positions shown; findings below may reference images not displayed]

FINDINGS: MRI HEAD FINDINGS

Brain: Right MCA infarct with restricted diffusion in the right
insula and right parietal lobe. Smaller area of acute infarct in the
right frontal lobe. Small areas of acute infarct in the high right
parietal lobe. No associated hemorrhage

Restricted diffusion in the left parietal cortex compatible with a
small area of acute infarct.

Restricted diffusion in the left occipital lobe correlating to
hypodensity on CT. This is compatible with subacute infarct.

Negative for hemorrhage.

1 cm extra-axial mass left parietal convexity compatible with
meningioma. No mass-effect

Mild atrophy.  Negative for hydrocephalus.

Vascular: Normal arterial flow voids

Skull and upper cervical spine: Negative

Sinuses/Orbits: Sinuses clear.  Bilateral cataract removal.

Other: None

MRA HEAD FINDINGS

Image quality degraded by motion.  Is

Both vertebral arteries patent to the basilar. Basilar widely
patent. PICA patent bilaterally. Posterior cerebral arteries patent
bilaterally.

Right internal carotid artery widely patent. Right anterior cerebral
artery widely patent. Mild to moderate stenosis distal right middle
cerebral artery. Moderate to severe stenosis proximal right M2
branch. This vessel was thrombosed previously and appears patent
with a significant stenosis which may be due to underlying
atherosclerotic disease.

Left internal carotid artery patent. Left anterior and middle
cerebral arteries patent bilaterally.

Negative for aneurysm
IMPRESSION: Acute right MCA infarct involving the right insula and frontal
parietal lobe. Negative for hemorrhage.

Small acute or subacute infarct left parietal lobe. Subacute infarct
left occipital lobe. Findings suggest emboli.

Status post revascularization of right middle cerebral artery
occlusion. There remains moderate to severe stenosis proximal right
M2 segment which may be due to underlying atherosclerotic disease.

## 2019-02-21 IMAGING — DX DG CHEST 1V PORT
1 series · 1 of 1 positions shown · non-contrast
Comparison: Portable chest x-ray May 28, 2017

CLINICAL DATA: CHF, CVA

EXAM:
PORTABLE CHEST 1 VIEW

[chest ap]
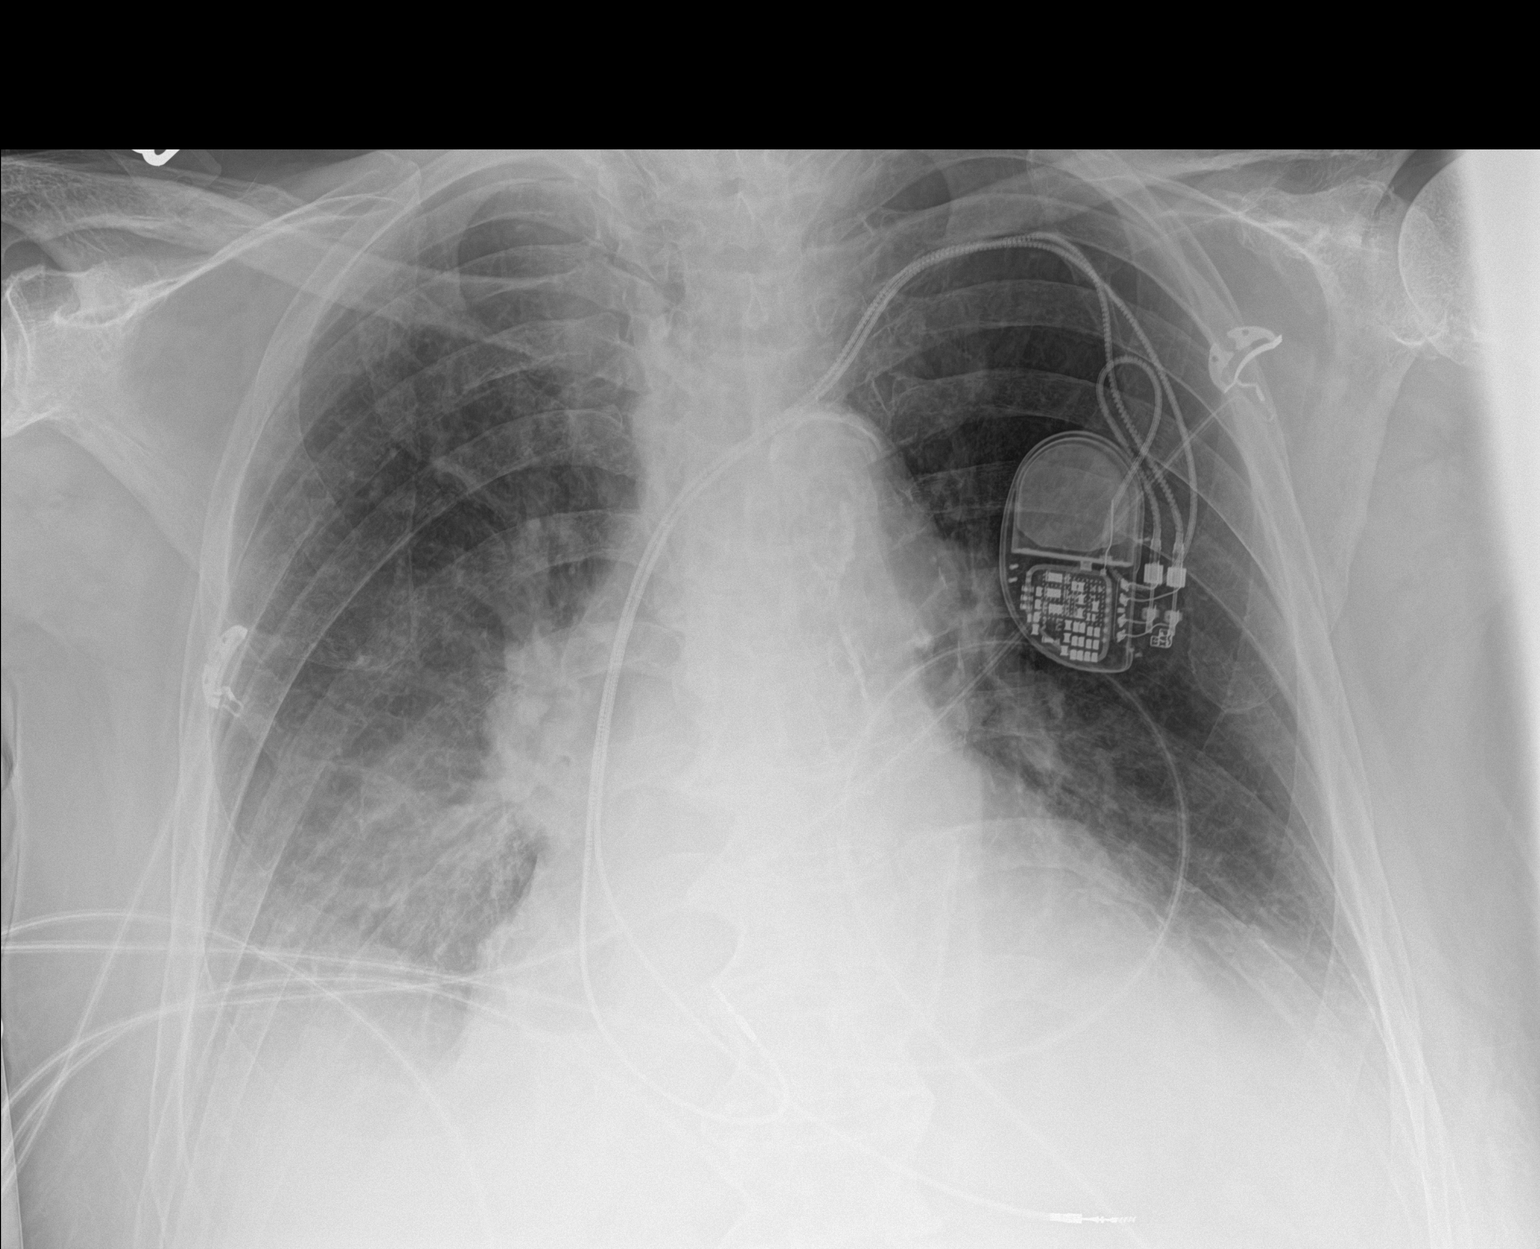

[1 of 1 positions shown; findings below may reference images not displayed]

FINDINGS: The lungs are adequately inflated. The interstitial markings remain
increased greatest on the right. There is partial obscuration of the
hemidiaphragms today likely reflecting posterior layering pleural
effusions. The cardiac silhouette remains enlarged. The pulmonary
vascularity remains engorged but is slightly more distinct today.
There is dense calcification in the wall of the thoracic aorta. The
ICD is in stable position. There is multilevel degenerative disc
disease of the thoracic spine.
IMPRESSION: Fairly stable appearance of the chest with exception of increased
small bilateral pleural effusions. CHF with mild pulmonary vascular
congestion greatest on the right.

Thoracic aortic atherosclerosis.
